# Patient Record
Sex: Female | Born: 1955 | Race: White | Hispanic: No | Smoking: Current every day smoker
Health system: Southern US, Community
[De-identification: ages and names within clinical notes are randomized; demographics above are authoritative.]

## PROBLEM LIST (undated history)

## (undated) DIAGNOSIS — R4182 Altered mental status, unspecified: Secondary | ICD-10-CM

## (undated) DIAGNOSIS — F32A Depression, unspecified: Secondary | ICD-10-CM

## (undated) DIAGNOSIS — E785 Hyperlipidemia, unspecified: Secondary | ICD-10-CM

## (undated) DIAGNOSIS — M1990 Unspecified osteoarthritis, unspecified site: Secondary | ICD-10-CM

## (undated) DIAGNOSIS — K635 Polyp of colon: Secondary | ICD-10-CM

## (undated) DIAGNOSIS — E119 Type 2 diabetes mellitus without complications: Secondary | ICD-10-CM

## (undated) DIAGNOSIS — M539 Dorsopathy, unspecified: Secondary | ICD-10-CM

## (undated) DIAGNOSIS — E7800 Pure hypercholesterolemia, unspecified: Secondary | ICD-10-CM

## (undated) DIAGNOSIS — J069 Acute upper respiratory infection, unspecified: Secondary | ICD-10-CM

## (undated) DIAGNOSIS — B029 Zoster without complications: Secondary | ICD-10-CM

## (undated) DIAGNOSIS — I10 Essential (primary) hypertension: Secondary | ICD-10-CM

## (undated) DIAGNOSIS — F329 Major depressive disorder, single episode, unspecified: Secondary | ICD-10-CM

## (undated) HISTORY — DX: Zoster without complications: B02.9

## (undated) HISTORY — PX: KYPHOPLASTY: SHX5884

## (undated) HISTORY — DX: Pure hypercholesterolemia, unspecified: E78.00

## (undated) HISTORY — DX: Dorsopathy, unspecified: M53.9

## (undated) HISTORY — PX: CHOLECYSTECTOMY: SHX55

## (undated) HISTORY — DX: Unspecified osteoarthritis, unspecified site: M19.90

## (undated) HISTORY — DX: Polyp of colon: K63.5

## (undated) HISTORY — DX: Altered mental status, unspecified: R41.82

## (undated) HISTORY — DX: Depression, unspecified: F32.A

## (undated) HISTORY — DX: Acute upper respiratory infection, unspecified: J06.9

## (undated) HISTORY — DX: Major depressive disorder, single episode, unspecified: F32.9

## (undated) HISTORY — PX: BACK SURGERY: SHX140

## (undated) HISTORY — PX: ABDOMINAL HYSTERECTOMY: SHX81

---

## 2006-06-08 ENCOUNTER — Emergency Department: Payer: Self-pay | Admitting: Emergency Medicine

## 2006-06-08 ENCOUNTER — Other Ambulatory Visit: Payer: Self-pay

## 2006-06-11 ENCOUNTER — Ambulatory Visit: Payer: Self-pay | Admitting: Emergency Medicine

## 2006-06-23 ENCOUNTER — Ambulatory Visit: Payer: Self-pay | Admitting: Gastroenterology

## 2006-07-14 ENCOUNTER — Ambulatory Visit: Payer: Self-pay | Admitting: Gastroenterology

## 2006-12-02 ENCOUNTER — Ambulatory Visit: Payer: Self-pay | Admitting: *Deleted

## 2006-12-08 ENCOUNTER — Ambulatory Visit: Payer: Self-pay | Admitting: *Deleted

## 2007-03-24 ENCOUNTER — Ambulatory Visit: Payer: Self-pay | Admitting: *Deleted

## 2007-05-25 ENCOUNTER — Encounter: Admission: RE | Admit: 2007-05-25 | Discharge: 2007-05-25 | Payer: Self-pay | Admitting: Neurosurgery

## 2007-05-27 ENCOUNTER — Encounter: Admission: RE | Admit: 2007-05-27 | Discharge: 2007-05-27 | Payer: Self-pay | Admitting: Neurosurgery

## 2009-04-30 ENCOUNTER — Encounter: Admission: RE | Admit: 2009-04-30 | Discharge: 2009-04-30 | Payer: Self-pay | Admitting: Orthopaedic Surgery

## 2009-12-10 ENCOUNTER — Encounter: Admission: RE | Admit: 2009-12-10 | Discharge: 2009-12-10 | Payer: Self-pay | Admitting: Orthopaedic Surgery

## 2012-07-06 ENCOUNTER — Ambulatory Visit: Payer: Self-pay

## 2012-07-24 ENCOUNTER — Ambulatory Visit: Payer: Self-pay

## 2012-08-11 ENCOUNTER — Emergency Department (HOSPITAL_COMMUNITY)
Admission: EM | Admit: 2012-08-11 | Discharge: 2012-08-11 | Disposition: A | Discharge status: Home / Self Care | Payer: No Typology Code available for payment source | Attending: Internal Medicine | Admitting: Internal Medicine

## 2012-08-11 ENCOUNTER — Emergency Department (HOSPITAL_COMMUNITY): Payer: No Typology Code available for payment source

## 2012-08-11 ENCOUNTER — Encounter (HOSPITAL_COMMUNITY): Payer: Self-pay | Admitting: *Deleted

## 2012-08-11 DIAGNOSIS — F17200 Nicotine dependence, unspecified, uncomplicated: Secondary | ICD-10-CM | POA: Insufficient documentation

## 2012-08-11 DIAGNOSIS — Y92410 Unspecified street and highway as the place of occurrence of the external cause: Secondary | ICD-10-CM | POA: Insufficient documentation

## 2012-08-11 DIAGNOSIS — S298XXA Other specified injuries of thorax, initial encounter: Secondary | ICD-10-CM | POA: Insufficient documentation

## 2012-08-11 DIAGNOSIS — S161XXA Strain of muscle, fascia and tendon at neck level, initial encounter: Secondary | ICD-10-CM

## 2012-08-11 DIAGNOSIS — Z79899 Other long term (current) drug therapy: Secondary | ICD-10-CM | POA: Insufficient documentation

## 2012-08-11 DIAGNOSIS — E119 Type 2 diabetes mellitus without complications: Secondary | ICD-10-CM | POA: Insufficient documentation

## 2012-08-11 DIAGNOSIS — S139XXA Sprain of joints and ligaments of unspecified parts of neck, initial encounter: Secondary | ICD-10-CM | POA: Insufficient documentation

## 2012-08-11 DIAGNOSIS — I10 Essential (primary) hypertension: Secondary | ICD-10-CM | POA: Insufficient documentation

## 2012-08-11 DIAGNOSIS — E785 Hyperlipidemia, unspecified: Secondary | ICD-10-CM | POA: Insufficient documentation

## 2012-08-11 DIAGNOSIS — Y9389 Activity, other specified: Secondary | ICD-10-CM | POA: Insufficient documentation

## 2012-08-11 HISTORY — DX: Essential (primary) hypertension: I10

## 2012-08-11 HISTORY — DX: Hyperlipidemia, unspecified: E78.5

## 2012-08-11 HISTORY — DX: Type 2 diabetes mellitus without complications: E11.9

## 2012-08-11 MED ORDER — HYDROCODONE-ACETAMINOPHEN 5-325 MG PO TABS: 1.0000 | ORAL_TABLET | Freq: Four times a day (QID) | ORAL | Status: DC | PRN

## 2012-08-11 MED ORDER — HYDROCODONE-ACETAMINOPHEN 5-325 MG PO TABS
1.0000 | ORAL_TABLET | Freq: Once | ORAL | Status: AC
  Administered 2012-08-11: 1 via ORAL
  Filled 2012-08-11: qty 1

## 2012-08-11 MED ORDER — IBUPROFEN 800 MG PO TABS: 800.0000 mg | ORAL_TABLET | Freq: Three times a day (TID) | ORAL | Status: DC | PRN
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
## 2012-08-15 NOTE — ED Provider Notes (Signed)
History/physical exam/procedure(s) were performed by non-physician practitioner and as supervising physician I was immediately available for consultation/collaboration. I have reviewed all notes and am in agreement with care and plan.   Shantasia Hunnell S Teran Daughenbaugh, MD 08/15/12 0714 

## 2012-11-18 ENCOUNTER — Emergency Department: Payer: Self-pay | Admitting: Emergency Medicine

## 2012-11-18 LAB — COMPREHENSIVE METABOLIC PANEL
Anion Gap: 9 (ref 7–16)
Co2: 27 mmol/L (ref 21–32)
Creatinine: 1.27 mg/dL (ref 0.60–1.30)
SGPT (ALT): 20 U/L (ref 12–78)
Total Protein: 8.1 g/dL (ref 6.4–8.2)

## 2012-11-18 LAB — URINALYSIS, COMPLETE
Bilirubin,UR: NEGATIVE
Blood: NEGATIVE
Glucose,UR: NEGATIVE mg/dL (ref 0–75)
Hyaline Cast: 23
Ph: 5 (ref 4.5–8.0)
Protein: 30
Squamous Epithelial: 1

## 2012-11-18 LAB — LIPASE, BLOOD: Lipase: 124 U/L (ref 73–393)

## 2012-11-18 LAB — CBC: MCH: 30.5 pg (ref 26.0–34.0)

## 2012-12-21 ENCOUNTER — Ambulatory Visit: Payer: Self-pay | Admitting: Pain Medicine

## 2013-01-09 ENCOUNTER — Ambulatory Visit: Payer: Self-pay | Admitting: Pain Medicine

## 2013-01-09 LAB — MAGNESIUM: Magnesium: 1.4 mg/dL — ABNORMAL LOW

## 2013-01-09 LAB — BASIC METABOLIC PANEL
Anion Gap: 5 — ABNORMAL LOW (ref 7–16)
BUN: 14 mg/dL (ref 7–18)
Chloride: 103 mmol/L (ref 98–107)
Creatinine: 0.94 mg/dL (ref 0.60–1.30)
EGFR (African American): >60
EGFR (Non-African Amer.): >60
Glucose: 58 mg/dL — ABNORMAL LOW (ref 65–99)
Osmolality: 276 (ref 275–301)
Sodium: 139 mmol/L (ref 136–145)

## 2013-01-09 LAB — SEDIMENTATION RATE: Erythrocyte Sed Rate: 47 mm/hr — ABNORMAL HIGH (ref 0–30)

## 2013-02-08 ENCOUNTER — Ambulatory Visit: Payer: Self-pay | Admitting: Pain Medicine

## 2013-02-14 ENCOUNTER — Ambulatory Visit: Payer: Self-pay | Admitting: Pain Medicine

## 2013-03-03 ENCOUNTER — Ambulatory Visit: Payer: Self-pay | Admitting: Pain Medicine

## 2013-03-07 ENCOUNTER — Ambulatory Visit: Payer: Self-pay | Admitting: Pain Medicine

## 2013-03-24 ENCOUNTER — Ambulatory Visit: Payer: Self-pay | Admitting: Pain Medicine

## 2013-03-31 ENCOUNTER — Emergency Department: Payer: Self-pay | Admitting: Emergency Medicine

## 2013-04-21 ENCOUNTER — Ambulatory Visit: Payer: Self-pay | Admitting: Pain Medicine

## 2013-05-02 ENCOUNTER — Ambulatory Visit: Payer: Self-pay | Admitting: Pain Medicine

## 2013-05-24 ENCOUNTER — Ambulatory Visit: Payer: Self-pay | Admitting: Pain Medicine

## 2013-05-30 ENCOUNTER — Ambulatory Visit: Payer: Self-pay | Admitting: Pain Medicine

## 2013-06-19 ENCOUNTER — Ambulatory Visit: Payer: Self-pay | Admitting: Pain Medicine

## 2013-07-11 ENCOUNTER — Ambulatory Visit: Payer: Self-pay | Admitting: Pain Medicine

## 2013-07-31 ENCOUNTER — Ambulatory Visit: Payer: Self-pay | Admitting: Pain Medicine

## 2013-10-18 ENCOUNTER — Ambulatory Visit: Payer: Self-pay | Admitting: Pain Medicine

## 2013-11-06 ENCOUNTER — Other Ambulatory Visit: Payer: Self-pay | Admitting: Pain Medicine

## 2013-11-06 LAB — MAGNESIUM: MAGNESIUM: 1.6 mg/dL — AB

## 2013-11-13 ENCOUNTER — Ambulatory Visit: Payer: Self-pay | Admitting: Pain Medicine

## 2013-12-12 ENCOUNTER — Ambulatory Visit: Payer: Self-pay | Admitting: Pain Medicine

## 2014-01-01 ENCOUNTER — Ambulatory Visit: Payer: Self-pay | Admitting: Pain Medicine

## 2014-01-10 ENCOUNTER — Ambulatory Visit: Payer: Self-pay | Admitting: Pain Medicine

## 2014-02-04 DIAGNOSIS — F419 Anxiety disorder, unspecified: Secondary | ICD-10-CM | POA: Insufficient documentation

## 2014-02-04 DIAGNOSIS — F329 Major depressive disorder, single episode, unspecified: Secondary | ICD-10-CM | POA: Insufficient documentation

## 2014-02-04 DIAGNOSIS — E119 Type 2 diabetes mellitus without complications: Secondary | ICD-10-CM | POA: Insufficient documentation

## 2014-02-04 DIAGNOSIS — R4182 Altered mental status, unspecified: Secondary | ICD-10-CM | POA: Insufficient documentation

## 2014-02-04 DIAGNOSIS — F32A Depression, unspecified: Secondary | ICD-10-CM | POA: Insufficient documentation

## 2014-02-04 HISTORY — DX: Altered mental status, unspecified: R41.82

## 2014-02-13 DIAGNOSIS — E785 Hyperlipidemia, unspecified: Secondary | ICD-10-CM | POA: Insufficient documentation

## 2014-02-14 ENCOUNTER — Ambulatory Visit: Payer: Self-pay | Admitting: Pain Medicine

## 2014-04-09 ENCOUNTER — Ambulatory Visit: Payer: Self-pay | Admitting: Pain Medicine

## 2014-04-26 ENCOUNTER — Ambulatory Visit: Payer: Self-pay | Admitting: Pain Medicine

## 2014-06-04 ENCOUNTER — Ambulatory Visit: Payer: Self-pay | Admitting: Pain Medicine

## 2014-07-10 ENCOUNTER — Ambulatory Visit: Payer: Self-pay | Admitting: Pain Medicine

## 2015-05-12 ENCOUNTER — Emergency Department
Admission: EM | Admit: 2015-05-12 | Discharge: 2015-05-12 | Disposition: A | Discharge status: Home / Self Care | Payer: Medicare Other | Attending: Emergency Medicine | Admitting: Emergency Medicine

## 2015-05-12 DIAGNOSIS — Z880 Allergy status to penicillin: Secondary | ICD-10-CM | POA: Diagnosis not present

## 2015-05-12 DIAGNOSIS — F1193 Opioid use, unspecified with withdrawal: Secondary | ICD-10-CM | POA: Insufficient documentation

## 2015-05-12 DIAGNOSIS — R51 Headache: Secondary | ICD-10-CM | POA: Insufficient documentation

## 2015-05-12 DIAGNOSIS — R112 Nausea with vomiting, unspecified: Secondary | ICD-10-CM | POA: Diagnosis present

## 2015-05-12 DIAGNOSIS — F1123 Opioid dependence with withdrawal: Secondary | ICD-10-CM

## 2015-05-12 DIAGNOSIS — E119 Type 2 diabetes mellitus without complications: Secondary | ICD-10-CM | POA: Diagnosis not present

## 2015-05-12 DIAGNOSIS — Z79899 Other long term (current) drug therapy: Secondary | ICD-10-CM | POA: Diagnosis not present

## 2015-05-12 DIAGNOSIS — Z720 Tobacco use: Secondary | ICD-10-CM | POA: Insufficient documentation

## 2015-05-12 DIAGNOSIS — R197 Diarrhea, unspecified: Secondary | ICD-10-CM | POA: Insufficient documentation

## 2015-05-12 DIAGNOSIS — I10 Essential (primary) hypertension: Secondary | ICD-10-CM | POA: Diagnosis not present

## 2015-05-12 LAB — COMPREHENSIVE METABOLIC PANEL
ALK PHOS: 71 U/L (ref 38–126)
ALT: 15 U/L (ref 14–54)
ANION GAP: 8 (ref 5–15)
AST: 26 U/L (ref 15–41)
Albumin: 4 g/dL (ref 3.5–5.0)
BILIRUBIN TOTAL: 0.6 mg/dL (ref 0.3–1.2)
BUN: 12 mg/dL (ref 6–20)
CALCIUM: 10.1 mg/dL (ref 8.9–10.3)
CO2: 31 mmol/L (ref 22–32)
CREATININE: 0.98 mg/dL (ref 0.44–1.00)
Chloride: 104 mmol/L (ref 101–111)
Glucose, Bld: 119 mg/dL — ABNORMAL HIGH (ref 65–99)
Potassium: 4 mmol/L (ref 3.5–5.1)
SODIUM: 143 mmol/L (ref 135–145)
TOTAL PROTEIN: 7.3 g/dL (ref 6.5–8.1)

## 2015-05-12 LAB — CBC WITH DIFFERENTIAL/PLATELET
BASOS ABS: 0.1 10*3/uL (ref 0–0.1)
BASOS PCT: 1 %
EOS ABS: 0.2 10*3/uL (ref 0–0.7)
Eosinophils Relative: 2 %
HEMATOCRIT: 40.5 % (ref 35.0–47.0)
HEMOGLOBIN: 13.9 g/dL (ref 12.0–16.0)
Lymphocytes Relative: 36 %
Lymphs Abs: 3.2 10*3/uL (ref 1.0–3.6)
MCH: 30.5 pg (ref 26.0–34.0)
MCHC: 34.2 g/dL (ref 32.0–36.0)
MCV: 89 fL (ref 80.0–100.0)
MONOS PCT: 6 %
Monocytes Absolute: 0.5 10*3/uL (ref 0.2–0.9)
NEUTROS ABS: 5 10*3/uL (ref 1.4–6.5)
NEUTROS PCT: 55 %
Platelets: 259 10*3/uL (ref 150–440)
RBC: 4.55 MIL/uL (ref 3.80–5.20)
RDW: 14.2 % (ref 11.5–14.5)
WBC: 9 10*3/uL (ref 3.6–11.0)

## 2015-05-12 MED ORDER — SODIUM CHLORIDE 0.9 % IV BOLUS (SEPSIS)
500.0000 mL | Freq: Once | INTRAVENOUS | Status: AC
  Administered 2015-05-12: 500 mL via INTRAVENOUS

## 2015-05-12 MED ORDER — TRAMADOL HCL 50 MG PO TABS
50.0000 mg | ORAL_TABLET | Freq: Four times a day (QID) | ORAL | Status: DC | PRN
Start: 2015-05-12 — End: 2015-06-18

## 2015-05-12 MED ORDER — CLONIDINE HCL 0.1 MG PO TABS
0.2000 mg | ORAL_TABLET | Freq: Once | ORAL | Status: AC
  Administered 2015-05-12: 0.2 mg via ORAL
  Filled 2015-05-12: qty 2

## 2015-05-12 MED ORDER — HYDROMORPHONE HCL 2 MG PO TABS
2.0000 mg | ORAL_TABLET | Freq: Once | ORAL | Status: AC
Start: 2015-05-12 — End: 2015-05-12
  Administered 2015-05-12: 2 mg via ORAL
  Filled 2015-05-12: qty 1

## 2015-05-12 MED ORDER — HYDROMORPHONE HCL 2 MG PO TABS: 2.0000 mg | ORAL_TABLET | Freq: Two times a day (BID) | ORAL | Status: AC | PRN

## 2015-05-12 MED ORDER — TRAMADOL HCL 50 MG PO TABS
50.0000 mg | ORAL_TABLET | Freq: Once | ORAL | Status: AC
  Administered 2015-05-12: 50 mg via ORAL
  Filled 2015-05-12: qty 1

## 2015-05-12 MED ORDER — LORAZEPAM 2 MG/ML IJ SOLN
1.0000 mg | Freq: Once | INTRAMUSCULAR | Status: AC
  Administered 2015-05-12: 1 mg via INTRAVENOUS
  Filled 2015-05-12: qty 1

## 2015-05-12 MED ORDER — CLONIDINE HCL 0.1 MG PO TABS: 0.1000 mg | ORAL_TABLET | Freq: Two times a day (BID) | ORAL | Status: DC | PRN

## 2015-05-12 NOTE — ED Notes (Signed)
Pt with hx of chronic back pain, states that she out of pain medication (fentanyl patch)   She is a pt of DR Laban Emperor with [facility]- because of a transition with his practice he gave her 3 prescriptions to get her through the 3 months.    She ran out of fentanyl 5 days ago but she is unable to find the other prescriptions.    Pt is c/o back pain, nausea, vomiting, diarrhea, headache,  Sweats and a fine tremor in her hands with fine motor skills.

## 2015-05-12 NOTE — ED Provider Notes (Signed)
Time Seen: Approximately ----------------------------------------- 6:59 PM on 05/12/2015 -----------------------------------------   I have reviewed the triage notes  Chief Complaint: Back Pain   History of Present Illness: Tammy Acosta is a 59 y.o. female who presents to the emergency department with symptoms of nausea, vomiting, diarrhea, headaches. Patient's been on long-term fentanyl patch treatment for previous lumbar fracture with previous back surgery. She is normally on Duragesic 75 g patches. She states her last patch she had was 5 days ago and she went to get her normally 3 prescribed pain meds and found the prescriptions missing. She states her father has dementia and will occasionally go. [employer]. She's tried to find the prescriptions in the household. Patient is followed locally by Dr. Laban Emperor [facility]. Patient states she does not want to be on the front no patch any longer and feels she may be at a point where she can transition to less potent medication. Past Medical History  Diagnosis Date  . Diabetes mellitus without complication   . Hypertension   . Hyperlipidemia     There are no active problems to display for this patient.   Past Surgical History  Procedure Laterality Date  . Back surgery    . Abdominal hysterectomy    . Cholecystectomy      Past Surgical History  Procedure Laterality Date  . Back surgery    . Abdominal hysterectomy    . Cholecystectomy      Current Outpatient Rx  Name  Route  Sig  Dispense  Refill  . ALPRAZolam (XANAX) 1 MG tablet   Oral   Take 1 mg by mouth 4 (four) times daily.         . fentaNYL (DURAGESIC - DOSED MCG/HR) 75 MCG/HR   Transdermal   Place 1 patch onto the skin every 3 (three) days.         Marland Kitchen gabapentin (NEURONTIN) 300 MG capsule   Oral   Take 600 mg by mouth 3 (three) times daily.         Marland Kitchen glimepiride (AMARYL) 2 MG tablet   Oral   Take 2 mg by mouth daily before  breakfast.         . hydrochlorothiazide (HYDRODIURIL) 12.5 MG tablet   Oral   Take 12.5 mg by mouth daily.         Marland Kitchen HYDROcodone-acetaminophen (NORCO/VICODIN) 5-325 MG per tablet   Oral   Take 1 tablet by mouth every 6 (six) hours as needed for pain.   15 tablet   0   . ibuprofen (ADVIL,MOTRIN) 800 MG tablet   Oral   Take 1 tablet (800 mg total) by mouth every 8 (eight) hours as needed for pain.   21 tablet   0   . lisinopril (PRINIVIL,ZESTRIL) 10 MG tablet   Oral   Take 5 mg by mouth daily.         . metFORMIN (GLUCOPHAGE) 1000 MG tablet   Oral   Take 1,000 mg by mouth 2 (two) times daily with a meal.         . simvastatin (ZOCOR) 40 MG tablet   Oral   Take 40 mg by mouth every evening.         Marland Kitchen tiZANidine (ZANAFLEX) 4 MG tablet   Oral   Take 4 mg by mouth 3 (three) times daily.         Marland Kitchen venlafaxine (EFFEXOR) 75 MG tablet   Oral   Take 75  mg by mouth 3 (three) times daily.           Allergies:  Penicillins and Tetracyclines & related  Family History: No family history on file.  Social History: Social History  Substance Use Topics  . Smoking status: Current Every Day Smoker  . Smokeless tobacco: Not on file  . Alcohol Use: No     Review of Systems:   10 point review of systems was performed and was otherwise negative:  Constitutional: No fever Eyes: No visual disturbances ENT: No sore throat, ear pain Cardiac: No chest pain Respiratory: No shortness of breath, wheezing, or stridor Abdomen: No abdominal pain, no vomiting, No diarrhea Endocrine: No weight loss, No night sweats Extremities: No peripheral edema, cyanosis Skin: No rashes, easy bruising Neurologic: No focal weakness, trouble with speech or swollowing Urologic: No dysuria, Hematuria, or urinary frequency   Physical Exam:  ED Triage Vitals  Enc Vitals Group     BP 05/12/15 1823 230/124 mmHg     Pulse Rate 05/12/15 1823 92     Resp 05/12/15 1841 11     Temp  05/12/15 1823 98.8 F (37.1 C)     Temp Source 05/12/15 1823 Oral     SpO2 05/12/15 1823 99 %     Weight 05/12/15 1823 150 lb (68.04 kg)     Height 05/12/15 1823  (1.626 m)     Head Cir --      Peak Flow --      Pain Score 05/12/15 1825 10     Pain Loc --      Pain Edu? --      Excl. in GC? --     General: Awake , Alert , and Oriented times 3; GCS 15 Head: Normal cephalic , atraumatic Eyes: Pupils equal , round, reactive to light Nose/Throat: No nasal drainage, patent upper airway without erythema or exudate.  Neck: Supple, Full range of motion, No anterior adenopathy or palpable thyroid masses Lungs: Clear to ascultation without wheezes , rhonchi, or rales Heart: Regular rate, regular rhythm without murmurs , gallops , or rubs Abdomen: Soft, non tender without rebound, guarding , or rigidity; bowel sounds positive and symmetric in all 4 quadrants. No organomegaly .        Extremities: 2 plus symmetric pulses. No edema, clubbing or cyanosis Neurologic: normal ambulation, Motor symmetric without deficits, sensory intact Skin: warm, dry, no rashes   Labs:   All laboratory work was reviewed including any pertinent negatives or positives listed below:  Labs Reviewed  COMPREHENSIVE METABOLIC PANEL  CBC WITH DIFFERENTIAL/PLATELET   laboratory work was reviewed with no significant findings   ED Course:  The patient remained hypertensive and was given some clonidine prior to discharge and she's been advised continue with her clonidine at home as needed and continue with her normal blood pressure medications. She's been advised to drink plenty of fluids and return here if her condition worsens. I prescribed her Ultram and a course of Dilaudid to try to wean her off of her fentanyl patches. He appears of awareness and is been advised to contact her primary physician on an outpatient basis to assist her with coming down on the narcotics.    Assessment: Prescription narcotic  withdrawal  Final Clinical Impression:  Prescription narcotic withdrawal Final diagnoses:  None     Plan:  Outpatient management           Jennye Moccasin, MD 05/12/15 2212

## 2015-05-12 NOTE — Discharge Instructions (Signed)
Opioid Withdrawal Opioids are a group of narcotic drugs. They include the street drug heroin. They also include pain medicines, such as morphine, hydrocodone, oxycodone, and fentanyl. Opioid withdrawal is a group of characteristic physical and mental signs and symptoms. It typically occurs if you have been using opioids daily for several weeks or longer and stop using or rapidly decrease use. Opioid withdrawal can also occur if you have used opioids daily for a long time and are given a medicine to block the effect.  SIGNS AND SYMPTOMS Opioid withdrawal includes three or more of the following symptoms:   Depressed, anxious, or irritable mood.  Nausea or vomiting.  Muscle aches or spasms.   Watery eyes.   Runny nose.  Dilated pupils, sweating, or hairs standing on end.  Diarrhea or intestinal cramping.  Yawning.   Fever.  Increased blood pressure.  Fast pulse.  Restlessness or trouble sleeping. These signs and symptoms occur within several hours of stopping or reducing short-acting opioids, such as heroin. They can occur within 3 days of stopping or reducing long-acting opioids, such as methadone. Withdrawal begins within minutes of receiving a drug that blocks the effects of opioids, such as naltrexone or naloxone. DIAGNOSIS  Opioid use disorder is diagnosed by your health care provider. You will be asked about your symptoms, drug and alcohol use, medical history, and use of medicines. A physical exam may be done. Lab tests may be ordered. Your health care provider may have you see a mental health professional.  TREATMENT  The treatment for opioid withdrawal is usually provided by medical doctors with special training in substance use disorders (addiction specialists). The following medicines may be included in treatment:  Opioids given in place of the abused opioid. They turn on opioid receptors in the brain and lessen or prevent withdrawal symptoms. They are gradually  decreased (opioid substitution and taper).  Non-opioids that can lessen certain opioid withdrawal symptoms. They may be used alone or with opioid substitution and taper. Successful long-term recovery usually requires medicine, counseling, and group support. HOME CARE INSTRUCTIONS   Take medicines only as directed by your health care provider.  Check with your health care provider before starting new medicines.  Keep all follow-up visits as directed by your health care provider. SEEK MEDICAL CARE IF:  You are not able to take your medicines as directed.  Your symptoms get worse.  You relapse. SEEK IMMEDIATE MEDICAL CARE IF:  You have serious thoughts about hurting yourself or others.  You have a seizure.  You lose consciousness. Document Released: 08/13/2003 Document Revised: 12/25/2013 Document Reviewed: 08/23/2013 [facility] Patient Information 2015 [place], [place]. This information is not intended to replace advice given to you by your health care provider. Make sure you discuss any questions you have with your health care provider.  Please return immediately if condition worsens. Please contact her primary physician or the physician you were given for referral. If you have any specialist physicians involved in her treatment and plan please also contact them. Thank you for using [facility].

## 2015-05-12 NOTE — ED Notes (Signed)
Pt in room 13,  Primary RN Helmut Muster and Dr Huel Cote aware of patient/abnormal vitals.

## 2015-05-21 DIAGNOSIS — I10 Essential (primary) hypertension: Secondary | ICD-10-CM | POA: Insufficient documentation

## 2015-06-17 ENCOUNTER — Encounter: Payer: Medicare Other | Admitting: Pain Medicine

## 2015-06-18 ENCOUNTER — Encounter: Payer: Self-pay | Admitting: Pain Medicine

## 2015-06-18 ENCOUNTER — Ambulatory Visit: Payer: Medicare Other | Attending: Pain Medicine | Admitting: Pain Medicine

## 2015-06-18 VITALS — BP 195/105 | HR 87 | Temp 98.3°F | Resp 18 | Ht 63.0 in | Wt 152.0 lb

## 2015-06-18 DIAGNOSIS — M79605 Pain in left leg: Secondary | ICD-10-CM | POA: Diagnosis not present

## 2015-06-18 DIAGNOSIS — M79604 Pain in right leg: Secondary | ICD-10-CM | POA: Insufficient documentation

## 2015-06-18 DIAGNOSIS — M5416 Radiculopathy, lumbar region: Secondary | ICD-10-CM | POA: Diagnosis not present

## 2015-06-18 DIAGNOSIS — M961 Postlaminectomy syndrome, not elsewhere classified: Secondary | ICD-10-CM | POA: Insufficient documentation

## 2015-06-18 DIAGNOSIS — Z79891 Long term (current) use of opiate analgesic: Secondary | ICD-10-CM | POA: Insufficient documentation

## 2015-06-18 DIAGNOSIS — I10 Essential (primary) hypertension: Secondary | ICD-10-CM | POA: Diagnosis not present

## 2015-06-18 DIAGNOSIS — E119 Type 2 diabetes mellitus without complications: Secondary | ICD-10-CM | POA: Diagnosis not present

## 2015-06-18 DIAGNOSIS — M545 Low back pain, unspecified: Secondary | ICD-10-CM

## 2015-06-18 DIAGNOSIS — G894 Chronic pain syndrome: Secondary | ICD-10-CM | POA: Insufficient documentation

## 2015-06-18 DIAGNOSIS — E785 Hyperlipidemia, unspecified: Secondary | ICD-10-CM | POA: Insufficient documentation

## 2015-06-18 DIAGNOSIS — Z7984 Long term (current) use of oral hypoglycemic drugs: Secondary | ICD-10-CM | POA: Diagnosis not present

## 2015-06-18 DIAGNOSIS — G8929 Other chronic pain: Secondary | ICD-10-CM | POA: Diagnosis not present

## 2015-06-18 DIAGNOSIS — F17200 Nicotine dependence, unspecified, uncomplicated: Secondary | ICD-10-CM | POA: Insufficient documentation

## 2015-06-18 DIAGNOSIS — F1190 Opioid use, unspecified, uncomplicated: Secondary | ICD-10-CM | POA: Diagnosis not present

## 2015-06-18 DIAGNOSIS — M539 Dorsopathy, unspecified: Secondary | ICD-10-CM | POA: Diagnosis not present

## 2015-06-18 DIAGNOSIS — Z5181 Encounter for therapeutic drug level monitoring: Secondary | ICD-10-CM | POA: Insufficient documentation

## 2015-06-18 DIAGNOSIS — Z79899 Other long term (current) drug therapy: Secondary | ICD-10-CM

## 2015-06-18 DIAGNOSIS — M549 Dorsalgia, unspecified: Secondary | ICD-10-CM | POA: Insufficient documentation

## 2015-06-18 DIAGNOSIS — F1120 Opioid dependence, uncomplicated: Secondary | ICD-10-CM

## 2015-06-18 MED ORDER — GABAPENTIN 600 MG PO TABS: 600.0000 mg | ORAL_TABLET | Freq: Three times a day (TID) | ORAL | Status: DC

## 2015-06-18 MED ORDER — MORPHINE SULFATE ER 15 MG PO TBCR: 15.0000 mg | EXTENDED_RELEASE_TABLET | Freq: Two times a day (BID) | ORAL | Status: DC
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
## 2015-06-18 NOTE — Progress Notes (Signed)
Safety precautions to be maintained throughout the outpatient stay will include: orient to surroundings, keep bed in low position, maintain call bell within reach at all times, provide assistance with transfer out of bed and ambulation. Did not bring pills Pamphlet given on How to protect your medicines at home

## 2015-06-18 NOTE — Patient Instructions (Addendum)
Smoking Cessation, Tips for Success If you are ready to quit smoking, congratulations! You have chosen to help yourself be healthier. Cigarettes bring nicotine, tar, carbon monoxide, and other irritants into your body. Your lungs, heart, and blood vessels will be able to work better without these poisons. There are many different ways to quit smoking. Nicotine gum, nicotine patches, a nicotine inhaler, or nicotine nasal spray can help with physical craving. Hypnosis, support groups, and medicines help break the habit of smoking. WHAT THINGS CAN I DO TO MAKE QUITTING EASIER?  Here are some tips to help you quit for good:  Pick a date when you will quit smoking completely. Tell all of your friends and family about your plan to quit on that date.  Do not try to slowly cut down on the number of cigarettes you are smoking. Pick a quit date and quit smoking completely starting on that day.  Throw away all cigarettes.   Clean and remove all ashtrays from your home, work, and car.  On a card, write down your reasons for quitting. Carry the card with you and read it when you get the urge to smoke.  Cleanse your body of nicotine. Drink enough water and fluids to keep your urine clear or pale yellow. Do this after quitting to flush the nicotine from your body.  Learn to predict your moods. Do not let a bad situation be your excuse to have a cigarette. Some situations in your life might tempt you into wanting a cigarette.  Never have "just one" cigarette. It leads to wanting another and another. Remind yourself of your decision to quit.  Change habits associated with smoking. If you smoked while driving or when feeling stressed, try other activities to replace smoking. Stand up when drinking your coffee. Brush your teeth after eating. Sit in a different chair when you read the paper. Avoid alcohol while trying to quit, and try to drink fewer caffeinated beverages. Alcohol and caffeine may urge you to  smoke.  Avoid foods and drinks that can trigger a desire to smoke, such as sugary or spicy foods and alcohol.  Ask people who smoke not to smoke around you.  Have something planned to do right after eating or having a cup of coffee. For example, plan to take a walk or exercise.  Try a relaxation exercise to calm you down and decrease your stress. Remember, you may be tense and nervous for the first 2 weeks after you quit, but this will pass.  Find new activities to keep your hands busy. Play with a pen, coin, or rubber band. Doodle or draw things on paper.  Brush your teeth right after eating. This will help cut down on the craving for the taste of tobacco after meals. You can also try mouthwash.   Use oral substitutes in place of cigarettes. Try using lemon drops, carrots, cinnamon sticks, or chewing gum. Keep them handy so they are available when you have the urge to smoke.  When you have the urge to smoke, try deep breathing.  Designate your home as a nonsmoking area.  If you are a heavy smoker, ask your health care provider about a prescription for nicotine chewing gum. It can ease your withdrawal from nicotine.  Reward yourself. Set aside the cigarette money you save and buy yourself something nice.  Look for support from others. Join a support group or smoking cessation program. Ask someone at home or at work to help you with your plan   to quit smoking.  Always ask yourself, "Do I need this cigarette or is this just a reflex?" Tell yourself, "Today, I choose not to smoke," or "I do not want to smoke." You are reminding yourself of your decision to quit.  Do not replace cigarette smoking with electronic cigarettes (commonly called e-cigarettes). The safety of e-cigarettes is unknown, and some may contain harmful chemicals.  If you relapse, do not give up! Plan ahead and think about what you will do the next time you get the urge to smoke. HOW WILL I FEEL WHEN I QUIT SMOKING? You  may have symptoms of withdrawal because your body is used to nicotine (the addictive substance in cigarettes). You may crave cigarettes, be irritable, feel very hungry, cough often, get headaches, or have difficulty concentrating. The withdrawal symptoms are only temporary. They are strongest when you first quit but will go away within 10-14 days. When withdrawal symptoms occur, stay in control. Think about your reasons for quitting. Remind yourself that these are signs that your body is healing and getting used to being without cigarettes. Remember that withdrawal symptoms are easier to treat than the major diseases that smoking can cause.  Even after the withdrawal is over, expect periodic urges to smoke. However, these cravings are generally short lived and will go away whether you smoke or not. Do not smoke! WHAT RESOURCES ARE AVAILABLE TO HELP ME QUIT SMOKING? Your health care provider can direct you to community resources or hospitals for support, which may include:  Group support.  Education.  Hypnosis.  Therapy.   This information is not intended to replace advice given to you by your health care provider. Make sure you discuss any questions you have with your health care provider.   Document Released: 05/08/2004 Document Revised: 08/31/2014 Document Reviewed: 01/26/2013 Elsevier Interactive Patient Education 2016 [employer] Morphine sustained-release tablets What is this medicine? MORPHINE (MOR feen) is a pain reliever. It is used to treat moderate to severe pain that lasts for more than a few days. This medicine may be used for other purposes; ask your health care provider or pharmacist if you have questions. What should I tell my health care provider before I take this medicine? They need to know if you have any of these conditions: -brain tumor -drug abuse or addiction -gallbladder disease -head injury -heart disease -if you frequently drink alcohol-containing  drinks -intestinal disease -kidney disease or problems urinating -kyphoscoliosis -liver disease -lung disease, asthma, or breathing problems -pancreatic disease -seizures -stomach or intestine problems -taken isocarboxazid, phenelzine, tranylcypromine, or selegiline in the past 2 weeks -thyroid disease -an unusual or allergic reaction to lactose, morphine, other medicines, foods, dyes, or preservatives -pregnant or trying to get pregnant -breast-feeding How should I use this medicine? Take this medicine by mouth with a glass of water. Do not break, crush, or chew the medicine. Do not take a tablet that is not whole. A broken or crushed tablet can be very dangerous. You may get too much medicine. If the medicine upsets your stomach, take it with food or milk. Follow the directions on the prescription label. Take the medicine at the same time each day. Do not take more medicine than you are told to take. A special MedGuide will be given to you by the pharmacist with each prescription and refill. Be sure to read this information carefully each time. Talk to your pediatrician regarding the use of this medicine in children. Special care may be needed. Overdosage: If you  think you have taken too much of this medicine contact a poison control center or emergency room at once. NOTE: This medicine is only for you. Do not share this medicine with others. What if I miss a dose? If you miss a dose, take it as soon as you can. If it is almost time for your next dose, take only that dose. Do not take double or extra doses. What may interact with this medicine? Do not take this medicine with any of the following medications: -MAOIs like Carbex, Eldepryl, Marplan, Nardil, and Parnate This medicine may also interact with the following medications: -alcohol -antihistamines -barbiturates, like phenobarbital -medicines for depression, anxiety, or psychotic disturbances -medicines for sleep -muscle  relaxants -naltrexone, naloxone -narcotic medicines (opiates) for pain -rifampin -tramadol This list may not describe all possible interactions. Give your health care provider a list of all the medicines, herbs, non-prescription drugs, or dietary supplements you use. Also tell them if you smoke, drink alcohol, or use illegal drugs. Some items may interact with your medicine. What should I watch for while using this medicine? Tell your doctor or health care professional if your pain does not go away, if it gets worse, or if you have new or a different type of pain. You may develop tolerance to the medicine. Tolerance means that you will need a higher dose of the medicine for pain relief. Tolerance is normal and is expected if you take this medicine for a long time. Do not suddenly stop taking your medicine because you may develop a severe reaction. Your body becomes used to the medicine. This does NOT mean you are addicted. Addiction is a behavior related to getting and using a drug for a non-medical reason. If you have pain, you have a medical reason to take pain medicine. Your doctor will tell you how much medicine to take. If your doctor wants you to stop the medicine, the dose will be slowly lowered over time to avoid any side effects. You may get drowsy or dizzy when you first start taking the medicine or change doses. Do not drive, use machinery, or do anything that may be dangerous until you know how the medicine affects you. Stand or sit up slowly. There are different types of narcotic medicines (opiates) for pain. If you take more than one type at the same time, you may have more side effects. Give your health care provider a list of all medicines you use. Your doctor will tell you how much medicine to take. Do not take more medicine than directed. Call emergency for help if you have problems breathing. This medicine will cause constipation. Try to have a bowel movement at least every 2 to 3 days.  If you do not have a bowel movement for 3 days, call your doctor or health care professional. Your mouth may get dry. Drinking water, chewing sugarless gum, or sucking on hard candy may help. See your dentist every 6 months. What side effects may I notice from receiving this medicine? Side effects that you should report to your doctor or health care professional as soon as possible: -allergic reactions like skin rash, itching or hives, swelling of the face, lips, or tongue -breathing problems -change in the amount of urine -confusion -fast, irregular heartbeat -feeling faint or lightheaded -fever, chills -hallucinations -seizures -slow or fast heartbeat -unusually weak or tired Side effects that usually do not require medical attention (report to your doctor or health care professional if they continue or are bothersome): -  constipation -dizziness -headache -nausea, vomiting -pinpoint pupils -sweating This list may not describe all possible side effects. Call your doctor for medical advice about side effects. You may report side effects to FDA at 1-800-FDA-1088. Where should I keep my medicine? Keep out of the reach of children. This medicine can be abused. Keep your medicine in a safe place to protect it from theft. Do not share this medicine with anyone. Selling or giving away this medicine is dangerous and is against the law. Store at room temperature between 15 and 30 degrees C (59 and 86 degrees F). Protect from light. This medicine may cause accidental overdose and death if it is taken by other adults, children, or pets. Flush any unused medicine down the toilet to reduce the chance of harm. Do not use the medicine after the expiration date. NOTE: This sheet is a summary. It may not cover all possible information. If you have questions about this medicine, talk to your doctor, pharmacist, or health care provider.    2016, Elsevier/[employer] Standard. (2013-03-14 12:15:31) Epidural  Steroid Injection An epidural steroid injection is given to relieve pain in your neck, back, or legs that is caused by the irritation or swelling of a nerve root. This procedure involves injecting a steroid and numbing medicine (anesthetic) into the epidural space. The epidural space is the space between the outer covering of your spinal cord and the bones that form your backbone (vertebra).  LET [facility] CARE PROVIDER KNOW ABOUT:   Any allergies you have.  All medicines you are taking, including vitamins, herbs, eye drops, creams, and over-the-counter medicines such as aspirin.  Previous problems you or members of your family have had with the use of anesthetics.  Any blood disorders or blood clotting disorders you have.  Previous surgeries you have had.  Medical conditions you have. RISKS AND COMPLICATIONS Generally, this is a safe procedure. However, as with any procedure, complications can occur. Possible complications of epidural steroid injection include:  Headache.  Bleeding.  Infection.  Allergic reaction to the medicines.  Damage to your nerves. The response to this procedure depends on the underlying cause of the pain and its duration. People who have long-term (chronic) pain are less likely to benefit from epidural steroids than are those people whose pain comes on strong and suddenly. BEFORE THE PROCEDURE   Ask your health care provider about changing or stopping your regular medicines. You may be advised to stop taking blood-thinning medicines a few days before the procedure.  You may be given medicines to reduce anxiety.  Arrange for someone to take you home after the procedure. PROCEDURE   You will remain awake during the procedure. You may receive medicine to make you relaxed.  You will be asked to lie on your stomach.  The injection site will be cleaned.  The injection site will be numbed with a medicine (local anesthetic).  A needle will be injected  through your skin into the epidural space.  Your health care provider will use an X-ray machine to ensure that the steroid is delivered closest to the affected nerve. You may have minimal discomfort at this time.  Once the needle is in the right position, the local anesthetic and the steroid will be injected into the epidural space.  The needle will then be removed and a bandage will be applied to the injection site. AFTER THE PROCEDURE   You may be monitored for a short time before you go home.  You may  feel weakness or numbness in your arm or leg, which disappears within hours.  You may be allowed to eat, drink, and take your regular medicine.  You may have soreness at the site of the injection.   This information is not intended to replace advice given to you by your health care provider. Make sure you discuss any questions you have with your health care provider.   Document Released: 11/17/2007 Document Revised: 04/12/2013 Document Reviewed: 01/27/2013 Elsevier Interactive Patient Education [employer].

## 2015-06-25 ENCOUNTER — Ambulatory Visit: Payer: Medicare Other | Admitting: Pain Medicine

## 2015-07-11 ENCOUNTER — Other Ambulatory Visit: Payer: Self-pay | Admitting: Pain Medicine

## 2015-07-22 ENCOUNTER — Ambulatory Visit: Payer: Medicare Other | Attending: Pain Medicine | Admitting: Pain Medicine

## 2015-07-22 ENCOUNTER — Other Ambulatory Visit: Payer: Self-pay | Admitting: Pain Medicine

## 2015-07-22 VITALS — BP 184/94 | HR 90 | Temp 98.6°F | Resp 18 | Ht 63.0 in | Wt 152.0 lb

## 2015-07-22 DIAGNOSIS — M79605 Pain in left leg: Secondary | ICD-10-CM | POA: Insufficient documentation

## 2015-07-22 DIAGNOSIS — E785 Hyperlipidemia, unspecified: Secondary | ICD-10-CM | POA: Insufficient documentation

## 2015-07-22 DIAGNOSIS — M79604 Pain in right leg: Secondary | ICD-10-CM | POA: Insufficient documentation

## 2015-07-22 DIAGNOSIS — M7918 Myalgia, other site: Secondary | ICD-10-CM | POA: Insufficient documentation

## 2015-07-22 DIAGNOSIS — Z5181 Encounter for therapeutic drug level monitoring: Secondary | ICD-10-CM | POA: Diagnosis not present

## 2015-07-22 DIAGNOSIS — M791 Myalgia: Secondary | ICD-10-CM

## 2015-07-22 DIAGNOSIS — G8929 Other chronic pain: Secondary | ICD-10-CM | POA: Diagnosis not present

## 2015-07-22 DIAGNOSIS — F1190 Opioid use, unspecified, uncomplicated: Secondary | ICD-10-CM | POA: Diagnosis not present

## 2015-07-22 DIAGNOSIS — Z7189 Other specified counseling: Secondary | ICD-10-CM

## 2015-07-22 DIAGNOSIS — M545 Low back pain, unspecified: Secondary | ICD-10-CM

## 2015-07-22 DIAGNOSIS — Z79899 Other long term (current) drug therapy: Secondary | ICD-10-CM

## 2015-07-22 DIAGNOSIS — M792 Neuralgia and neuritis, unspecified: Secondary | ICD-10-CM | POA: Insufficient documentation

## 2015-07-22 DIAGNOSIS — E7800 Pure hypercholesterolemia, unspecified: Secondary | ICD-10-CM | POA: Insufficient documentation

## 2015-07-22 DIAGNOSIS — E119 Type 2 diabetes mellitus without complications: Secondary | ICD-10-CM | POA: Insufficient documentation

## 2015-07-22 DIAGNOSIS — I10 Essential (primary) hypertension: Secondary | ICD-10-CM | POA: Diagnosis not present

## 2015-07-22 DIAGNOSIS — Z79891 Long term (current) use of opiate analgesic: Secondary | ICD-10-CM

## 2015-07-22 DIAGNOSIS — M549 Dorsalgia, unspecified: Secondary | ICD-10-CM | POA: Diagnosis present

## 2015-07-22 MED ORDER — MORPHINE SULFATE ER 15 MG PO TBCR: 15.0000 mg | EXTENDED_RELEASE_TABLET | Freq: Two times a day (BID) | ORAL | Status: DC

## 2015-07-22 MED ORDER — GABAPENTIN 600 MG PO TABS: 600.0000 mg | ORAL_TABLET | Freq: Three times a day (TID) | ORAL | Status: DC

## 2015-07-22 NOTE — Progress Notes (Signed)
Patient's Name: Tammy Acosta MRN: [id number] DOB: 09-03-1955 DOS: 07/22/2015  Primary Reason(s) for Visit: Encounter for Medication Management CC: Back Pain   HPI:   Tammy Acosta is a 59 y.o. year old, female patient, who returns today as an established patient. She has Chronic pain; Failed back surgical syndrome; Bilateral lower extremity pain; Chronic radicular lumbar pain; Opiate use; Opioid dependence (HCC); Long term prescription opiate use; Long term current use of opiate analgesic; Encounter for therapeutic drug level monitoring; Chronic low back pain; Chronic pain syndrome; Clinical depression; Type 2 diabetes mellitus (HCC); Essential (primary) hypertension; HLD (hyperlipidemia); Encounter for chronic pain management; Neurogenic pain; Neuropathic pain; and Musculoskeletal pain on her problem list.. Her primarily concern today is the Back Pain     The patient comes into the clinic's today for follow-up after having been started on MS Contin. The patient indicates doing great with the new medication with near complete relief of the pain and no side effects. Will then stay as is.  Today's Pain Score: 1  Reported level of pain is compatible with clinical observation Pain Type: Chronic pain Pain Location: Back Pain Orientation: Lower Pain Descriptors / Indicators: Sharp, Constant Pain Frequency: Constant  Date of Last Visit: 06/18/15 Service Provided on Last Visit: Med Refill  Pharmacotherapy Review:   Side-effects or Adverse reactions: None reported. Effectiveness: Described as relatively effective, allowing for increase in activities of daily living (ADL). Onset of action: Within expected pharmacological parameters. Duration of action: Within normal limits for medication. Peak effect: Timing and results are as within normal expected parameters. [place] PMP: Compliant with practice rules and regulations. Medication Assessment Form: Reviewed. Patient indicates being compliant with  therapy Treatment compliance: Compliant. Substance Use Disorder (SUD) Risk Level: Low Pharmacologic Plan: Continue therapy as is.  Last Available Lab Work: Admission on 05/12/2015, Discharged on 05/12/2015  Component Date Value Ref Range Status  . Sodium 05/12/2015 143  135 - 145 mmol/L Final  . Potassium 05/12/2015 4.0  3.5 - 5.1 mmol/L Final  . Chloride 05/12/2015 104  101 - 111 mmol/L Final  . CO2 05/12/2015 31  22 - 32 mmol/L Final  . Glucose, Bld 05/12/2015 119* 65 - 99 mg/dL Final  . BUN 40/98/1191 12  6 - 20 mg/dL Final  . Creatinine, Ser 05/12/2015 0.98  0.44 - 1.00 mg/dL Final  . Calcium 47/82/9562 10.1  8.9 - 10.3 mg/dL Final  . Total Protein 05/12/2015 7.3  6.5 - 8.1 g/dL Final  . Albumin 13/03/6577 4.0  3.5 - 5.0 g/dL Final  . AST 46/96/2952 26  15 - 41 U/L Final  . ALT 05/12/2015 15  14 - 54 U/L Final  . Alkaline Phosphatase 05/12/2015 71  38 - 126 U/L Final  . Total Bilirubin 05/12/2015 0.6  0.3 - 1.2 mg/dL Final  . GFR calc non Af Amer 05/12/2015 >60  >60 mL/min Final  . GFR calc Af Amer 05/12/2015 >60  >60 mL/min Final  . Anion gap 05/12/2015 8  5 - 15 Final  . WBC 05/12/2015 9.0  3.6 - 11.0 K/uL Final  . RBC 05/12/2015 4.55  3.80 - 5.20 MIL/uL Final  . Hemoglobin 05/12/2015 13.9  12.0 - 16.0 g/dL Final  . HCT 84/13/2440 40.5  35.0 - 47.0 % Final  . MCV 05/12/2015 89.0  80.0 - 100.0 fL Final  . MCH 05/12/2015 30.5  26.0 - 34.0 pg Final  . MCHC 05/12/2015 34.2  32.0 - 36.0 g/dL Final  . RDW 06/20/2535 14.2  11.5 - 14.5 % Final  . Platelets 05/12/2015 259  150 - 440 K/uL Final  . Neutrophils Relative % 05/12/2015 55   Final  . Neutro Abs 05/12/2015 5.0  1.4 - 6.5 K/uL Final  . Lymphocytes Relative 05/12/2015 36   Final  . Lymphs Abs 05/12/2015 3.2  1.0 - 3.6 K/uL Final  . Monocytes Relative 05/12/2015 6   Final  . Monocytes Absolute 05/12/2015 0.5  0.2 - 0.9 K/uL Final  . Eosinophils Relative 05/12/2015 2   Final  . Eosinophils Absolute 05/12/2015 0.2  0 - 0.7  K/uL Final  . Basophils Relative 05/12/2015 1   Final  . Basophils Absolute 05/12/2015 0.1  0 - 0.1 K/uL Final    Allergies: Tammy Acosta is allergic to penicillins and tetracyclines & related.  Meds: The patient has a current medication list which includes the following prescription(s): clonidine, gabapentin, lisinopril, metformin, morphine, simvastatin, tizanidine, venlafaxine, morphine, and morphine. Requested Prescriptions   Signed Prescriptions Disp Refills  . gabapentin (NEURONTIN) 600 MG tablet 90 tablet 2    Sig: Take 1 tablet (600 mg total) by mouth every 8 (eight) hours.  Marland Kitchen morphine (MS CONTIN) 15 MG 12 hr tablet 60 tablet 0    Sig: Take 1 tablet (15 mg total) by mouth every 12 (twelve) hours.  Marland Kitchen morphine (MS CONTIN) 15 MG 12 hr tablet 60 tablet 0    Sig: Take 1 tablet (15 mg total) by mouth every 12 (twelve) hours.  Marland Kitchen morphine (MS CONTIN) 15 MG 12 hr tablet 60 tablet 0    Sig: Take 1 tablet (15 mg total) by mouth every 12 (twelve) hours.    ROS: Constitutional: Afebrile, no chills, well hydrated and well nourished Gastrointestinal: negative Musculoskeletal:negative Neurological: negative Behavioral/Psych: negative  PFSH: Medical:  Tammy Acosta  has a past medical history of Diabetes mellitus without complication (HCC); Hypertension; Hyperlipidemia; Hypercholesteremia; and Spine disorder. Family: family history includes Heart disease in her father and mother; Hypertension in her mother. Surgical:  has past surgical history that includes Back surgery; Abdominal hysterectomy; Cholecystectomy; and Kyphoplasty. Tobacco:  reports that she has been smoking.  She does not have any smokeless tobacco history on file. Alcohol:  reports that she does not drink alcohol. Drug:  reports that she does not use illicit drugs.  Physical Exam: Vitals:  Today's Vitals   07/22/15 1349 07/22/15 1351  BP:  184/94  Pulse: 90   Temp: 98.6 F (37 C)   Resp: 18   Height: 5\' 3"  (1.6 m)    Weight: 152 lb (68.947 kg)   SpO2: 97%   PainSc: 1  1   PainLoc: Back   Calculated BMI: Body mass index is 26.93 kg/(m^2). General appearance: alert, cooperative, appears older than stated age, no distress and mildly obese Eyes: conjunctivae/corneas clear. PERRL, EOM's intact. Fundi benign. Lungs: No evidence respiratory distress, no audible rales or ronchi and no use of accessory muscles of respiration Neck: no adenopathy, no carotid bruit, no JVD, supple, symmetrical, trachea midline and thyroid not enlarged, symmetric, no tenderness/mass/nodules Back: symmetric, no curvature. ROM normal. No CVA tenderness. Extremities: extremities normal, atraumatic, no cyanosis or edema Pulses: 2+ and symmetric Skin: Skin color, texture, turgor normal. No rashes or lesions Neurologic: Grossly normal    Assessment: Encounter Diagnosis:  Primary Diagnosis: Chronic pain [G89.29]  Plan: Kayli was seen today for back pain.  Diagnoses and all orders for this visit:  Chronic pain -     gabapentin (NEURONTIN) 600 MG tablet;  Take 1 tablet (600 mg total) by mouth every 8 (eight) hours. -     morphine (MS CONTIN) 15 MG 12 hr tablet; Take 1 tablet (15 mg total) by mouth every 12 (twelve) hours. -     morphine (MS CONTIN) 15 MG 12 hr tablet; Take 1 tablet (15 mg total) by mouth every 12 (twelve) hours. -     morphine (MS CONTIN) 15 MG 12 hr tablet; Take 1 tablet (15 mg total) by mouth every 12 (twelve) hours. -     COMPLETE METABOLIC PANEL WITH GFR; Future -     C-reactive protein; Future -     Magnesium; Future -     Sedimentation rate; Future -     Vitamin D2,D3 Panel; Future  Encounter for therapeutic drug level monitoring  Encounter for chronic pain management  Opiate use  Long term prescription opiate use  Long term current use of opiate analgesic -     Drugs of abuse screen w/o alc, rtn urine-sln  Neurogenic pain -     gabapentin (NEURONTIN) 600 MG tablet; Take 1 tablet (600 mg total)  by mouth every 8 (eight) hours.  Neuropathic pain -     gabapentin (NEURONTIN) 600 MG tablet; Take 1 tablet (600 mg total) by mouth every 8 (eight) hours.  Musculoskeletal pain  Chronic low back pain  Bilateral lower extremity pain     Patient Instructions  Smoking Cessation, Tips for Success If you are ready to quit smoking, congratulations! You have chosen to help yourself be healthier. Cigarettes bring nicotine, tar, carbon monoxide, and other irritants into your body. Your lungs, heart, and blood vessels will be able to work better without these poisons. There are many different ways to quit smoking. Nicotine gum, nicotine patches, a nicotine inhaler, or nicotine nasal spray can help with physical craving. Hypnosis, support groups, and medicines help break the habit of smoking. WHAT THINGS CAN I DO TO MAKE QUITTING EASIER?  Here are some tips to help you quit for good:  Pick a date when you will quit smoking completely. Tell all of your friends and family about your plan to quit on that date.  Do not try to slowly cut down on the number of cigarettes you are smoking. Pick a quit date and quit smoking completely starting on that day.  Throw away all cigarettes.   Clean and remove all ashtrays from your home, work, and car.  On a card, write down your reasons for quitting. Carry the card with you and read it when you get the urge to smoke.  Cleanse your body of nicotine. Drink enough water and fluids to keep your urine clear or pale yellow. Do this after quitting to flush the nicotine from your body.  Learn to predict your moods. Do not let a bad situation be your excuse to have a cigarette. Some situations in your life might tempt you into wanting a cigarette.  Never have "just one" cigarette. It leads to wanting another and another. Remind yourself of your decision to quit.  Change habits associated with smoking. If you smoked while driving or when feeling stressed, try other  activities to replace smoking. Stand up when drinking your coffee. Brush your teeth after eating. Sit in a different chair when you read the paper. Avoid alcohol while trying to quit, and try to drink fewer caffeinated beverages. Alcohol and caffeine may urge you to smoke.  Avoid foods and drinks that can trigger a desire to smoke,  such as sugary or spicy foods and alcohol.  Ask people who smoke not to smoke around you.  Have something planned to do right after eating or having a cup of coffee. For example, plan to take a walk or exercise.  Try a relaxation exercise to calm you down and decrease your stress. Remember, you may be tense and nervous for the first 2 weeks after you quit, but this will pass.  Find new activities to keep your hands busy. Play with a pen, coin, or rubber band. Doodle or draw things on paper.  Brush your teeth right after eating. This will help cut down on the craving for the taste of tobacco after meals. You can also try mouthwash.   Use oral substitutes in place of cigarettes. Try using lemon drops, carrots, cinnamon sticks, or chewing gum. Keep them handy so they are available when you have the urge to smoke.  When you have the urge to smoke, try deep breathing.  Designate your home as a nonsmoking area.  If you are a heavy smoker, ask your health care provider about a prescription for nicotine chewing gum. It can ease your withdrawal from nicotine.  Reward yourself. Set aside the cigarette money you save and buy yourself something nice.  Look for support from others. Join a support group or smoking cessation program. Ask someone at home or at work to help you with your plan to quit smoking.  Always ask yourself, "Do I need this cigarette or is this just a reflex?" Tell yourself, "Today, I choose not to smoke," or "I do not want to smoke." You are reminding yourself of your decision to quit.  Do not replace cigarette smoking with electronic cigarettes  (commonly called e-cigarettes). The safety of e-cigarettes is unknown, and some may contain harmful chemicals.  If you relapse, do not give up! Plan ahead and think about what you will do the next time you get the urge to smoke. HOW WILL I FEEL WHEN I QUIT SMOKING? You may have symptoms of withdrawal because your body is used to nicotine (the addictive substance in cigarettes). You may crave cigarettes, be irritable, feel very hungry, cough often, get headaches, or have difficulty concentrating. The withdrawal symptoms are only temporary. They are strongest when you first quit but will go away within 10-14 days. When withdrawal symptoms occur, stay in control. Think about your reasons for quitting. Remind yourself that these are signs that your body is healing and getting used to being without cigarettes. Remember that withdrawal symptoms are easier to treat than the major diseases that smoking can cause.  Even after the withdrawal is over, expect periodic urges to smoke. However, these cravings are generally short lived and will go away whether you smoke or not. Do not smoke! WHAT RESOURCES ARE AVAILABLE TO HELP ME QUIT SMOKING? Your health care provider can direct you to community resources or hospitals for support, which may include:  Group support.  Education.  Hypnosis.  Therapy.   This information is not intended to replace advice given to you by your health care provider. Make sure you discuss any questions you have with your health care provider.   Document Released: 05/08/2004 Document Revised: 08/31/2014 Document Reviewed: 01/26/2013 Elsevier Interactive Patient Education 2016 [employer].    Medications discontinued today:  Medications Discontinued During This Encounter  Medication Reason  . venlafaxine (EFFEXOR) 75 MG tablet Error  . gabapentin (NEURONTIN) 300 MG capsule   . gabapentin (NEURONTIN) 600 MG tablet  Reorder  . morphine (MS CONTIN) 15 MG 12 hr tablet Reorder    Medications administered today:  [name] had no medications administered during this visit.  Primary Care Physician: Leotis ShamesSingh,Jasmine, MD [facility] Note by: Sydnee LevansFrancisco A. Laban EmperorNaveira, M.D, DABA, DABAPM, DABPM, DABIPP, FIPP

## 2015-07-22 NOTE — Progress Notes (Signed)
Morphine 15 mg pill count #2

## 2015-07-22 NOTE — Patient Instructions (Signed)
Smoking Cessation, Tips for Success If you are ready to quit smoking, congratulations! You have chosen to help yourself be healthier. Cigarettes bring nicotine, tar, carbon monoxide, and other irritants into your body. Your lungs, heart, and blood vessels will be able to work better without these poisons. There are many different ways to quit smoking. Nicotine gum, nicotine patches, a nicotine inhaler, or nicotine nasal spray can help with physical craving. Hypnosis, support groups, and medicines help break the habit of smoking. WHAT THINGS CAN I DO TO MAKE QUITTING EASIER?  Here are some tips to help you quit for good:  Pick a date when you will quit smoking completely. Tell all of your friends and family about your plan to quit on that date.  Do not try to slowly cut down on the number of cigarettes you are smoking. Pick a quit date and quit smoking completely starting on that day.  Throw away all cigarettes.   Clean and remove all ashtrays from your home, work, and car.  On a card, write down your reasons for quitting. Carry the card with you and read it when you get the urge to smoke.  Cleanse your body of nicotine. Drink enough water and fluids to keep your urine clear or pale yellow. Do this after quitting to flush the nicotine from your body.  Learn to predict your moods. Do not let a bad situation be your excuse to have a cigarette. Some situations in your life might tempt you into wanting a cigarette.  Never have "just one" cigarette. It leads to wanting another and another. Remind yourself of your decision to quit.  Change habits associated with smoking. If you smoked while driving or when feeling stressed, try other activities to replace smoking. Stand up when drinking your coffee. Brush your teeth after eating. Sit in a different chair when you read the paper. Avoid alcohol while trying to quit, and try to drink fewer caffeinated beverages. Alcohol and caffeine may urge you to  smoke.  Avoid foods and drinks that can trigger a desire to smoke, such as sugary or spicy foods and alcohol.  Ask people who smoke not to smoke around you.  Have something planned to do right after eating or having a cup of coffee. For example, plan to take a walk or exercise.  Try a relaxation exercise to calm you down and decrease your stress. Remember, you may be tense and nervous for the first 2 weeks after you quit, but this will pass.  Find new activities to keep your hands busy. Play with a pen, coin, or rubber band. Doodle or draw things on paper.  Brush your teeth right after eating. This will help cut down on the craving for the taste of tobacco after meals. You can also try mouthwash.   Use oral substitutes in place of cigarettes. Try using lemon drops, carrots, cinnamon sticks, or chewing gum. Keep them handy so they are available when you have the urge to smoke.  When you have the urge to smoke, try deep breathing.  Designate your home as a nonsmoking area.  If you are a heavy smoker, ask your health care provider about a prescription for nicotine chewing gum. It can ease your withdrawal from nicotine.  Reward yourself. Set aside the cigarette money you save and buy yourself something nice.  Look for support from others. Join a support group or smoking cessation program. Ask someone at home or at work to help you with your plan   to quit smoking.  Always ask yourself, "Do I need this cigarette or is this just a reflex?" Tell yourself, "Today, I choose not to smoke," or "I do not want to smoke." You are reminding yourself of your decision to quit.  Do not replace cigarette smoking with electronic cigarettes (commonly called e-cigarettes). The safety of e-cigarettes is unknown, and some may contain harmful chemicals.  If you relapse, do not give up! Plan ahead and think about what you will do the next time you get the urge to smoke. HOW WILL I FEEL WHEN I QUIT SMOKING? You  may have symptoms of withdrawal because your body is used to nicotine (the addictive substance in cigarettes). You may crave cigarettes, be irritable, feel very hungry, cough often, get headaches, or have difficulty concentrating. The withdrawal symptoms are only temporary. They are strongest when you first quit but will go away within 10-14 days. When withdrawal symptoms occur, stay in control. Think about your reasons for quitting. Remind yourself that these are signs that your body is healing and getting used to being without cigarettes. Remember that withdrawal symptoms are easier to treat than the major diseases that smoking can cause.  Even after the withdrawal is over, expect periodic urges to smoke. However, these cravings are generally short lived and will go away whether you smoke or not. Do not smoke! WHAT RESOURCES ARE AVAILABLE TO HELP ME QUIT SMOKING? Your health care provider can direct you to community resources or hospitals for support, which may include:  Group support.  Education.  Hypnosis.  Therapy.   This information is not intended to replace advice given to you by your health care provider. Make sure you discuss any questions you have with your health care provider.   Document Released: 05/08/2004 Document Revised: 08/31/2014 Document Reviewed: 01/26/2013 Elsevier Interactive Patient Education 2016 [employer]  

## 2015-07-27 LAB — TOXASSURE SELECT 13 (MW), URINE: PDF: 0

## 2015-08-05 ENCOUNTER — Emergency Department
Admission: EM | Admit: 2015-08-05 | Discharge: 2015-08-05 | Disposition: A | Discharge status: Home / Self Care | Payer: Medicare Other | Attending: Emergency Medicine | Admitting: Emergency Medicine

## 2015-08-05 ENCOUNTER — Emergency Department: Payer: Medicare Other

## 2015-08-05 DIAGNOSIS — I10 Essential (primary) hypertension: Secondary | ICD-10-CM | POA: Diagnosis not present

## 2015-08-05 DIAGNOSIS — Z7984 Long term (current) use of oral hypoglycemic drugs: Secondary | ICD-10-CM | POA: Diagnosis not present

## 2015-08-05 DIAGNOSIS — Y998 Other external cause status: Secondary | ICD-10-CM | POA: Insufficient documentation

## 2015-08-05 DIAGNOSIS — F17210 Nicotine dependence, cigarettes, uncomplicated: Secondary | ICD-10-CM | POA: Insufficient documentation

## 2015-08-05 DIAGNOSIS — S0502XA Injury of conjunctiva and corneal abrasion without foreign body, left eye, initial encounter: Secondary | ICD-10-CM | POA: Insufficient documentation

## 2015-08-05 DIAGNOSIS — X58XXXA Exposure to other specified factors, initial encounter: Secondary | ICD-10-CM | POA: Diagnosis not present

## 2015-08-05 DIAGNOSIS — Y9289 Other specified places as the place of occurrence of the external cause: Secondary | ICD-10-CM | POA: Insufficient documentation

## 2015-08-05 DIAGNOSIS — Z880 Allergy status to penicillin: Secondary | ICD-10-CM | POA: Insufficient documentation

## 2015-08-05 DIAGNOSIS — E119 Type 2 diabetes mellitus without complications: Secondary | ICD-10-CM | POA: Insufficient documentation

## 2015-08-05 DIAGNOSIS — Y9389 Activity, other specified: Secondary | ICD-10-CM | POA: Insufficient documentation

## 2015-08-05 DIAGNOSIS — S0592XA Unspecified injury of left eye and orbit, initial encounter: Secondary | ICD-10-CM | POA: Diagnosis present

## 2015-08-05 DIAGNOSIS — Z79899 Other long term (current) drug therapy: Secondary | ICD-10-CM | POA: Diagnosis not present

## 2015-08-05 LAB — CBC WITH DIFFERENTIAL/PLATELET
Basophils Absolute: 0 10*3/uL (ref 0–0.1)
Basophils Relative: 0 %
EOS PCT: 2 %
Eosinophils Absolute: 0.2 10*3/uL (ref 0–0.7)
HCT: 40.4 % (ref 35.0–47.0)
Hemoglobin: 13.5 g/dL (ref 12.0–16.0)
LYMPHS ABS: 3.8 10*3/uL — AB (ref 1.0–3.6)
Lymphocytes Relative: 36 %
MCH: 30.6 pg (ref 26.0–34.0)
MCHC: 33.3 g/dL (ref 32.0–36.0)
MCV: 91.9 fL (ref 80.0–100.0)
MONO ABS: 0.6 10*3/uL (ref 0.2–0.9)
Monocytes Relative: 6 %
NEUTROS PCT: 56 %
Neutro Abs: 5.9 10*3/uL (ref 1.4–6.5)
PLATELETS: 326 10*3/uL (ref 150–440)
RBC: 4.4 MIL/uL (ref 3.80–5.20)
RDW: 14.2 % (ref 11.5–14.5)
WBC: 10.6 10*3/uL (ref 3.6–11.0)

## 2015-08-05 LAB — BASIC METABOLIC PANEL
Anion gap: 9 (ref 5–15)
BUN: 14 mg/dL (ref 6–20)
CALCIUM: 9.5 mg/dL (ref 8.9–10.3)
CO2: 26 mmol/L (ref 22–32)
Chloride: 105 mmol/L (ref 101–111)
Creatinine, Ser: 0.88 mg/dL (ref 0.44–1.00)
GFR calc Af Amer: >60 mL/min (ref >60)
GLUCOSE: 129 mg/dL — AB (ref 65–99)
POTASSIUM: 3.7 mmol/L (ref 3.5–5.1)
Sodium: 140 mmol/L (ref 135–145)

## 2015-08-05 LAB — SEDIMENTATION RATE: Sed Rate: 63 mm/hr — ABNORMAL HIGH (ref 0–30)

## 2015-08-05 MED ORDER — ENALAPRILAT 1.25 MG/ML IV SOLN
1.2500 mg | Freq: Once | INTRAVENOUS | Status: AC
  Administered 2015-08-05: 1.25 mg via INTRAVENOUS
  Filled 2015-08-05: qty 2

## 2015-08-05 MED ORDER — CLONIDINE HCL 0.1 MG PO TABS
0.1000 mg | ORAL_TABLET | Freq: Once | ORAL | Status: AC
  Administered 2015-08-05: 0.1 mg via ORAL
  Filled 2015-08-05: qty 1

## 2015-08-05 MED ORDER — TETRACAINE HCL 0.5 % OP SOLN
OPHTHALMIC | Status: AC
  Administered 2015-08-05: 2 [drp] via OPHTHALMIC
  Filled 2015-08-05: qty 2

## 2015-08-05 MED ORDER — TETRACAINE HCL 0.5 % OP SOLN
2.0000 [drp] | Freq: Once | OPHTHALMIC | Status: AC
  Administered 2015-08-05: 2 [drp] via OPHTHALMIC

## 2015-08-05 MED ORDER — CLONIDINE HCL 0.1 MG/24HR TD PTWK: 0.1000 mg | MEDICATED_PATCH | Freq: Once | TRANSDERMAL | Status: DC

## 2015-08-05 NOTE — ED Provider Notes (Signed)
[facility] Emergency Department Provider Note  ____________________________________________  Time seen: Approximately 1:28 PM  I have reviewed the triage vital signs and the nursing notes.   HISTORY  Chief Complaint Eye Problem    HPI Tammy Acosta is a 59 y.o. female patient reports her left eye began bothering her about a week ago it came all runny in full of liquid. She's been wiping it out. About 3 days ago the cornea got cloudy in the eye got very injected and painful in her vision decreased. Patient is never had this before. 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  Past Medical History  Diagnosis Date  . Diabetes mellitus without complication (HCC)   . Hypertension   . Hyperlipidemia   . Hypercholesteremia   . Spine disorder     Patient Active Problem List   Diagnosis Date Noted  . Encounter for chronic pain management 07/22/2015  . Neurogenic pain 07/22/2015  . Neuropathic pain 07/22/2015  . Musculoskeletal pain 07/22/2015  . Chronic pain 06/18/2015  . 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 Bilateral lower extremity pain 06/18/2015  . Chronic radicular lumbar pain 06/18/2015  . Opiate use 06/18/2015  . Opioid dependence (HCC) 06/18/2015  . Long term prescription opiate use 06/18/2015  . Long term current use of opiate analgesic 06/18/2015  . Encounter for therapeutic drug level monitoring 06/18/2015  . Chronic low back pain 06/18/2015  . Chronic pain syndrome 06/18/2015  . Essential (primary) hypertension 05/21/2015  . HLD (hyperlipidemia) 02/13/2014  . Clinical depression 02/04/2014  . Type 2 diabetes mellitus (HCC) 02/04/2014    Past Surgical History  Procedure Laterality Date  . Back surgery    . Abdominal hysterectomy    . Cholecystectomy    . Kyphoplasty      Current Outpatient Rx  Name  Route  Sig  Dispense  Refill  .  cloNIDine (CATAPRES) 0.3 MG tablet   Oral   Take 0.3 mg by mouth 2 (two) times daily.         Marland Kitchen gabapentin (NEURONTIN) 600 MG tablet   Oral   Take 1 tablet (600 mg total) by mouth every 8 (eight) hours.   90 tablet   2     Do not place this medication, or any other prescri ...   . lisinopril (PRINIVIL,ZESTRIL) 20 MG tablet   Oral   Take 20 mg by mouth daily.         . metFORMIN (GLUCOPHAGE) 1000 MG tablet   Oral   Take 1,000 mg by mouth 2 (two) times daily with a meal.         . morphine (MS CONTIN) 15 MG 12 hr tablet   Oral   Take 1 tablet (15 mg total) by mouth every 12 (twelve) hours.   60 tablet   0     Do not place this medication, or any other prescri ...   . morphine (MS CONTIN) 15 MG 12 hr tablet   Oral   Take 1 tablet (15 mg total) by mouth every 12 (twelve) hours.   60 tablet   0     Do not place this medication, or any other prescri ...   . morphine (MS CONTIN) 15 MG 12 hr tablet   Oral   Take 1 tablet (15 mg total) by mouth every 12 (twelve) hours.   60 tablet  0     Do not place this medication, or any other prescri ...   . simvastatin (ZOCOR) 40 MG tablet   Oral   Take 40 mg by mouth every evening.         Marland Kitchen. tiZANidine (ZANAFLEX) 4 MG tablet   Oral   Take 4 mg by mouth 3 (three) times daily.         Marland Kitchen. venlafaxine (EFFEXOR) 75 MG tablet   Oral   Take 75 mg by mouth 3 (three) times daily.           Allergies Penicillins and Tetracyclines & related  Family History  Problem Relation Age of Onset  . Heart disease Mother   . Hypertension Mother   . Heart disease Father     Social History Social History  Substance Use Topics  . Smoking status: Current Every Day Smoker    Types: Cigarettes  . Smokeless tobacco: None  . Alcohol Use: No    Review of Systems Constitutional: No fever/chills Eyes: See H&P ENT: No sore throat. Cardiovascular: Denies chest pain. Respiratory: Denies shortness of breath. Gastrointestinal: No  abdominal pain.  No nausea, no vomiting.  No diarrhea.  No constipation. Genitourinary: Negative for dysuria. Musculoskeletal: Negative for back pain. Skin: Negative for rash. Neurological: Negative for headaches, focal weakness or numbness. } 10-point ROS otherwise negative.  ____________________________________________   PHYSICAL EXAM:  VITAL SIGNS: ED Triage Vitals  Enc Vitals Group     BP 08/05/15 0923 234/111 mmHg     Pulse Rate 08/05/15 0923 109     Resp 08/05/15 0923 17     Temp 08/05/15 0923 98.6 F (37 C)     Temp Source 08/05/15 0923 Oral     SpO2 08/05/15 0923 98 %     Weight 08/05/15 0923 158 lb (71.668 kg)     Height 08/05/15 0923 5\' 4"  (1.626 m)     Head Cir --      Peak Flow --      Pain Score 08/05/15 0924 4     Pain Loc --      Pain Edu? --      Excl. in GC? --    He was Constitutional: Alert and oriented. Well appearing and in no acute distress. Eyes: Conjunctivae are normal. PERRL. EOMI. Head: Atraumatic. Nose: No congestion/rhinnorhea. Mouth/Throat: Mucous membranes are moist.  Oropharynx non-erythematous. Neck: No stridor.   Cardiovascular: Normal rate, regular rhythm. Grossly normal heart sounds.  Good peripheral circulation. Respiratory: Normal respiratory effort.  No retractions. Lungs CTAB. Gastrointestinal: Soft and nontender. No distention. No abdominal bruits. No CVA tenderness. Musculoskeletal: No lower extremity tenderness nor edema.  No joint effusions. Neurologic:  Normal speech and language. No gross focal neurologic deficits are appreciated. No gait instability. Skin:  Skin is warm, dry and intact. No rash noted. Psychiatric: Mood and affect are normal. Speech and behavior are normal.  ____________________________________________   LABS (all labs ordered are listed, but only abnormal results are displayed)  Labs Reviewed  CBC WITH DIFFERENTIAL/PLATELET - Abnormal; Notable for the following:    Lymphs Abs 3.8 (*)    All other  components within normal limits  BASIC METABOLIC PANEL - Abnormal; Notable for the following:    Glucose, Bld 129 (*)    All other components within normal limits  SEDIMENTATION RATE   ____________________________________________  EKG   ____________________________________________  RADIOLOGY  ____________________________________________   PROCEDURES Intraocular pressure was measured 17 twice and 15 once. Dr. Druscilla BrowniePorfilio  was called and came in to see the patient and diagnosed with a corneal abrasion. Patient's blood pressure comes down with clonidine TTS one patch and the enalapril IV. Patient feels well otherwise except for her eye pain. Dr. Lindon Romp will put her on Vigamox eyedrops every hour and follow her up in the office within the next couple days. ____________________________________________   INITIAL IMPRESSION / ASSESSMENT AND PLAN / ED COURSE  Pertinent labs & imaging results that were available during my care of the patient were reviewed by me and considered in my medical decision making (see chart for details).   ____________________________________________   FINAL CLINICAL IMPRESSION(S) / ED DIAGNOSES  Final diagnoses:  Corneal abrasion, left, initial encounter      Arnaldo Natal, MD 08/05/15 [phone]

## 2015-08-05 NOTE — Discharge Instructions (Signed)
Corneal Abrasion The cornea is the clear covering at the front and center of the eye. When looking at the colored portion of the eye (iris), you are looking through the cornea. This very thin tissue is made up of many layers. The surface layer is a single layer of cells (corneal epithelium) and is one of the most sensitive tissues in the body. If a scratch or injury causes the corneal epithelium to come off, it is called a corneal abrasion. If the injury extends to the tissues below the epithelium, the condition is called a corneal ulcer. CAUSES   Scratches.  Trauma.  Foreign body in the eye. Some people have recurrences of abrasions in the area of the original injury even after it has healed (recurrent erosion syndrome). Recurrent erosion syndrome generally improves and goes away with time. SYMPTOMS   Eye pain.  Difficulty or inability to keep the injured eye open.  The eye becomes very sensitive to light.  Recurrent erosions tend to happen suddenly, first thing in the morning, usually after waking up and opening the eye. DIAGNOSIS  Your health care provider can diagnose a corneal abrasion during an eye exam. Dye is usually placed in the eye using a drop or a small paper strip moistened by your tears. When the eye is examined with a special light, the abrasion shows up clearly because of the dye. TREATMENT   Small abrasions may be treated with antibiotic drops or ointment alone.  A pressure patch may be put over the eye. If this is done, follow your doctor's instructions for when to remove the patch. Do not drive or use machines while the eye patch is on. Judging distances is hard to do with a patch on. If the abrasion becomes infected and spreads to the deeper tissues of the cornea, a corneal ulcer can result. This is serious because it can cause corneal scarring. Corneal scars interfere with light passing through the cornea and cause a loss of vision in the involved eye. HOME CARE  INSTRUCTIONS  Use medicine or ointment as directed. Only take over-the-counter or prescription medicines for pain, discomfort, or fever as directed by your health care provider.  Do not drive or operate machinery if your eye is patched. Your ability to judge distances is impaired.  If your health care provider has given you a follow-up appointment, it is very important to keep that appointment. Not keeping the appointment could result in a severe eye infection or permanent loss of vision. If there is any problem keeping the appointment, let your health care provider know. SEEK MEDICAL CARE IF:   You have pain, light sensitivity, and a scratchy feeling in one eye or both eyes.  Your pressure patch keeps loosening up, and you can blink your eye under the patch after treatment.  Any kind of discharge develops from the eye after treatment or if the lids stick together in the morning.  You have the same symptoms in the morning as you did with the original abrasion days, weeks, or months after the abrasion healed.   This information is not intended to replace advice given to you by your health care provider. Make sure you discuss any questions you have with your health care provider.   Document Released: 08/07/2000 Document Revised: 05/01/2015 Document Reviewed: 04/17/2013 Elsevier Interactive Patient Education 2016 [employer]  Please use antibiotic eyedrops that Dr. Lindon RompPorfirio gave you. Use them as he directed. Make sure to follow-up with him as he directed. Please return  for any further problems.

## 2015-08-05 NOTE — ED Notes (Signed)
Pt c/o left eye redness and drainage for the past 4 days with visual changes.. Pt is Hypertensive in triage, states she has been having issues with controlling her BP and her PCP is aware..Marland Kitchen

## 2015-09-04 ENCOUNTER — Ambulatory Visit (INDEPENDENT_AMBULATORY_CARE_PROVIDER_SITE_OTHER): Payer: Medicare Other | Admitting: Family Medicine

## 2015-09-04 VITALS — BP 180/100 | HR 83 | Temp 98.6°F | Ht 62.0 in | Wt 163.4 lb

## 2015-09-04 DIAGNOSIS — I10 Essential (primary) hypertension: Secondary | ICD-10-CM

## 2015-09-04 DIAGNOSIS — F32A Depression, unspecified: Secondary | ICD-10-CM

## 2015-09-04 DIAGNOSIS — F329 Major depressive disorder, single episode, unspecified: Secondary | ICD-10-CM | POA: Diagnosis not present

## 2015-09-04 MED ORDER — VENLAFAXINE HCL 37.5 MG PO TABS: 37.5000 mg | ORAL_TABLET | Freq: Every day | ORAL | Status: DC

## 2015-09-04 MED ORDER — HYDROCHLOROTHIAZIDE 25 MG PO TABS: 12.5000 mg | ORAL_TABLET | Freq: Every day | ORAL | Status: DC

## 2015-09-04 NOTE — Patient Instructions (Signed)
Nice to meet you.   we are going to start you on hydrochlorothiazide for your blood pressure. He should take this daily. We will check lab work today.  We're going to decrease your Effexor to 37.5 mg daily for the next several weeks  In an attempt to start a taper given that this medication can increase her blood pressure.   if you develop nausea, vomiting, diarrhea, headaches, lightheadedness, dizziness, diminished appetite, sweating, chills, tremors, paresthesias, fatigue, somnolence, and sleep disturbances, agitation, anxiety, akathisia, panic attacks, irritability, aggressiveness, worsening of mood, dysphoria, mood lability, hyperactivity, mania/hypomania, depersonalization, decreased concentration, slowed thinking, confusion, and memory or concentration difficulties please let us know.  If you develop chest pain, shortness of breath, numbness, weakness, vision changes, worsening headaches , thoughts of harming herself or others, or any new or changing symptoms please seek medical attention.

## 2015-09-04 NOTE — Progress Notes (Signed)
Patient ID: Tammy Acosta, female   DOB: 1955-08-31, 60 y.o.   MRN: [id number]  Tammy Rumps, MD Phone: [phone]  Tammy Acosta is a 60 y.o. female who presents today for new patient visit.  HYPERTENSION Disease Monitoring Home BP Monitoring  Does not check Chest pain-  no    Dyspnea-  no Medications Compliance-   Taking lisinopril and clonidine. Lightheadedness-   no  Edema-  No  Patient notes for the last 2-3 months she's had blood pressure issues. She's been seen in the emergency room several times and has been noted to be in the 917 range systolic. She notes she was started on lisinopril by her prior PCP. Clonidine was started in the emergency room. She notes she was previously on HCTZ though her most recent PCP stopped this for some reason that she is unaware of. She notes no cardiac symptoms , shortness of breath, vision changes, numbness, weakness, or swelling in her feet, she does note daily frontal headaches that can range from mild to moderate nature. She has a history of headaches are not as frequent. Note she has a mild headache now.  Patient does note she is followed by ophthalmology for poor vision in her left eye. Last saw them 2 weeks ago. She's had no acute vision changes. 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  Depression: Patient notes she's had depression for a long time. She's been on Effexor for at least the last 6-7 years. She had been on it 3 times daily, though over the last 2-3 weeks she's only been taking one a day as she was about to run out. As no adverse effects since coming down on her dose other than that her depression has worsened. No SI or HI.   Active Ambulatory Problems    Diagnosis Date Noted  . Chronic pain 06/18/2015  . 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 Bilateral lower extremity pain 06/18/2015  . Chronic radicular lumbar pain 06/18/2015  . Opiate use 06/18/2015   . Opioid dependence ([place]) 06/18/2015  . Long term prescription opiate use 06/18/2015  . Long term current use of opiate analgesic 06/18/2015  . Encounter for therapeutic drug level monitoring 06/18/2015  . Chronic low back pain 06/18/2015  . Chronic pain syndrome 06/18/2015  . Clinical depression 02/04/2014  . Type 2 diabetes mellitus ([place]) 02/04/2014  . Essential (primary) hypertension 05/21/2015  . HLD (hyperlipidemia) 02/13/2014  . Encounter for chronic pain management 07/22/2015  . Neurogenic pain 07/22/2015  . Neuropathic pain 07/22/2015  . Musculoskeletal pain 07/22/2015   Resolved Ambulatory Problems    Diagnosis Date Noted  . No Resolved Ambulatory Problems   Past Medical History  Diagnosis Date  . Diabetes mellitus without complication ([place])   . Hypertension   . Hyperlipidemia   . Hypercholesteremia   . Spine disorder   . Arthritis   . Depression   . Colon polyps     Family History  Problem Relation Age of Onset  . Heart disease Mother   . Hypertension Mother   . Heart disease Father   . Arthritis      Parent, other relative  . Colon cancer      Parent  . Breast cancer      Parent  . Lung cancer      Other relative  . Diabetes      Other relative  . Uterine  cancer      Other relative    Social History   Social History  . Marital Status: Divorced    Spouse Name: N/A  . Number of Children: N/A  . Years of Education: N/A   Occupational History  . Not on file.   Social History Main Topics  . Smoking status: Current Every Day Smoker    Types: Cigarettes  . Smokeless tobacco: Not on file  . Alcohol Use: No  . Drug Use: No  . Sexual Activity: Not on file   Other Topics Concern  . Not on file   Social History Narrative    ROS   General:  Negative for nexplained weight loss, fever Skin: Negative for new or changing mole, sore that won't heal HEENT: Negative for trouble hearing, trouble seeing, ringing in ears, mouth sores, hoarseness,  change in voice, dysphagia. CV:  Negative for chest pain, dyspnea, edema, palpitations Resp: Negative for cough, dyspnea, hemoptysis GI: Negative for nausea, vomiting, diarrhea, constipation, abdominal pain, melena, hematochezia. GU: Negative for dysuria, incontinence, urinary hesitance, hematuria, vaginal or penile discharge, polyuria, sexual difficulty, lumps in testicle or breasts MSK: Negative for muscle cramps or aches, joint pain or swelling Neuro: Positive for headaches, Negative for weakness, numbness, dizziness, passing out/fainting Psych: Positive for depression, Negative for anxiety, memory problems  Objective  Physical Exam Filed Vitals:   09/04/15 1516 09/04/15 1610  BP: 172/110 180/100  Pulse: 83   Temp: 98.6 F (37 C)     BP Readings from Last 3 Encounters:  09/04/15 180/100  08/05/15 166/88  07/22/15 184/94   Wt Readings from Last 3 Encounters:  09/04/15 163 lb 6.4 oz (74.118 kg)  08/05/15 158 lb (71.668 kg)  07/22/15 152 lb (68.947 kg)    Physical Exam  Constitutional: She is well-developed, well-nourished, and in no distress.  HENT:  Head: Normocephalic and atraumatic.  Right Ear: External ear normal.  Left Ear: External ear normal.  Mouth/Throat: Oropharynx is clear and moist. No oropharyngeal exudate.  Eyes: Conjunctivae are normal. Pupils are equal, round, and reactive to light.  Fundus exam attempted, though difficult given pupillary constriction  Neck: Neck supple.  Cardiovascular: Normal rate, regular rhythm and normal heart sounds.  Exam reveals no gallop and no friction rub.   No murmur heard. Pulmonary/Chest: Effort normal and breath sounds normal. No respiratory distress. She has no wheezes. She has no rales.  Abdominal: Soft. Bowel sounds are normal. She exhibits no distension. There is no tenderness. There is no rebound and no guarding.  Musculoskeletal: She exhibits no edema.  Lymphadenopathy:    She has no cervical adenopathy.    Neurological: She is alert.  See vision screening, visual fields intact, CN 2-12 intact, 5/5 strength in bilateral biceps, triceps, grip, quads, hamstrings, plantar and dorsiflexion, sensation to light touch intact in bilateral UE and LE, normal gait, 2+ patellar reflexes, negative Romberg, no pronator drift  Skin: Skin is warm and dry. She is not diaphoretic.  Psychiatric:  Mood depressed, affect depressed     Assessment/Plan:   Essential (primary) hypertension Blood pressure is quite elevated today. Has been elevated for at least the past several months based on review of her ED visits. She does have mild headache, though no end organ damage symptoms at this time. Vision is abnormal in the left eye, though she reports this is a chronic issue and is unchanged recently. She is neurologically intact. She has been on Effexor which can cause an increase in blood  pressure. She is currently taking clonidine and does take this daily so doubt rebound hypertension. She is also taking lisinopril. Given her blood pressure today we will start her on hydrochlorothiazide 12.5 mg daily. We'll check a CMP to evaluate for renal dysfunction. She will start checking her blood pressure at home. She is given parameters. We'll taper her off of her Effexor as well. She is given return precautions.  Clinical depression Depression not well controlled on current Effexor dose. No SI or HI. I suspect that the Effexor could be contributing to her blood pressure and her blood pressure would preclude [place] from going up on this thus we need to taper her off Effexor so that we can start her on another medication for depression. She will decrease her dose to 37.5 mg daily for 2 weeks. She'll follow-up with me in a week to see how she is doing and then consider further taper. She's given symptoms of withdrawal. If she develops symptoms she was advised to give [place] a call and to increase back to 75 mg daily. Did discuss sending her to a  therapist, though she declined this. Given return precautions.    Orders Placed This Encounter  Procedures  . Comp Met (CMET)    Standing Status: Future     Number of Occurrences: 1     Standing Expiration Date: 09/03/2016    Meds ordered this encounter  Medications  . venlafaxine (EFFEXOR) 37.5 MG tablet    Sig: Take 1 tablet (37.5 mg total) by mouth daily with breakfast.    Dispense:  30 tablet    Refill:  0  . hydrochlorothiazide (HYDRODIURIL) 25 MG tablet    Sig: Take 0.5 tablets (12.5 mg total) by mouth daily.    Dispense:  90 tablet    Refill:  3    Dragon voice recognition software was used during the dictation process of this note. If any phrases or words seem inappropriate it is likely secondary to the translation process being inefficient.  Tammy Acosta

## 2015-09-04 NOTE — Progress Notes (Signed)
Pre visit review using our clinic review tool, if applicable. No additional management support is needed unless otherwise documented below in the visit note. 

## 2015-09-05 ENCOUNTER — Encounter: Payer: Self-pay | Admitting: Family Medicine

## 2015-09-05 LAB — COMPREHENSIVE METABOLIC PANEL
ALBUMIN: 4.5 g/dL (ref 3.5–5.2)
ALK PHOS: 71 U/L (ref 39–117)
ALT: 12 U/L (ref 0–35)
AST: 15 U/L (ref 0–37)
BILIRUBIN TOTAL: 0.2 mg/dL (ref 0.2–1.2)
BUN: 14 mg/dL (ref 6–23)
CALCIUM: 10.2 mg/dL (ref 8.4–10.5)
CHLORIDE: 100 meq/L (ref 96–112)
CO2: 30 meq/L (ref 19–32)
Creatinine, Ser: 0.89 mg/dL (ref 0.40–1.20)
GFR: 68.65 mL/min (ref >60.00)
Glucose, Bld: 128 mg/dL — ABNORMAL HIGH (ref 70–99)
POTASSIUM: 4.8 meq/L (ref 3.5–5.1)
Sodium: 140 mEq/L (ref 135–145)
Total Protein: 7.4 g/dL (ref 6.0–8.3)

## 2015-09-05 NOTE — Assessment & Plan Note (Addendum)
Blood pressure is quite elevated today. Has been elevated for at least the past several months based on review of her ED visits. She does have mild headache, though no end organ damage symptoms at this time. Vision is abnormal in the left eye, though she reports this is a chronic issue and is unchanged recently. She is neurologically intact. She has been on Effexor which can cause an increase in blood pressure. She is currently taking clonidine and does take this daily so doubt rebound hypertension. She is also taking lisinopril. Given her blood pressure today we will start her on hydrochlorothiazide 12.5 mg daily. We'll check a CMP to evaluate for renal dysfunction. She will start checking her blood pressure at home. She is given parameters. We'll taper her off of her Effexor as well. She is given return precautions.

## 2015-09-05 NOTE — Assessment & Plan Note (Signed)
Depression not well controlled on current Effexor dose. No SI or HI. I suspect that the Effexor could be contributing to her blood pressure and her blood pressure would preclude us from going up on this thus we need to taper her off Effexor so that we can start her on another medication for depression. She will decrease her dose to 37.5 mg daily for 2 weeks. She'll follow-up with me in a week to see how she is doing and then consider further taper. She's given symptoms of withdrawal. If she develops symptoms she was advised to give us a call and to increase back to 75 mg daily. Did discuss sending her to a therapist, though she declined this. Given return precautions.

## 2015-09-09 ENCOUNTER — Telehealth: Payer: Self-pay | Admitting: Family Medicine

## 2015-09-09 NOTE — Telephone Encounter (Signed)
Pt called stating her dad passed away last night so she will not be able to come in for her appt on 09/11/2015. Thank You!

## 2015-09-09 NOTE — Telephone Encounter (Signed)
noted 

## 2015-09-11 ENCOUNTER — Ambulatory Visit: Payer: Medicare Other | Admitting: Family Medicine

## 2015-09-23 ENCOUNTER — Telehealth: Payer: Self-pay

## 2015-09-23 NOTE — Telephone Encounter (Signed)
Pt needs tizanidine to be sent to the pharmacy

## 2015-09-25 ENCOUNTER — Telehealth: Payer: Self-pay

## 2015-09-25 NOTE — Telephone Encounter (Signed)
Information given to Dr Laban Emperor regarding prescription.

## 2015-09-30 ENCOUNTER — Ambulatory Visit (INDEPENDENT_AMBULATORY_CARE_PROVIDER_SITE_OTHER): Payer: Medicare Other | Admitting: Family Medicine

## 2015-09-30 ENCOUNTER — Encounter: Payer: Self-pay | Admitting: Family Medicine

## 2015-09-30 VITALS — BP 128/86 | HR 90 | Temp 98.8°F | Ht 62.0 in | Wt 162.2 lb

## 2015-09-30 DIAGNOSIS — F329 Major depressive disorder, single episode, unspecified: Secondary | ICD-10-CM | POA: Diagnosis not present

## 2015-09-30 DIAGNOSIS — G8929 Other chronic pain: Secondary | ICD-10-CM

## 2015-09-30 DIAGNOSIS — M545 Low back pain, unspecified: Secondary | ICD-10-CM

## 2015-09-30 DIAGNOSIS — F32A Depression, unspecified: Secondary | ICD-10-CM

## 2015-09-30 DIAGNOSIS — E119 Type 2 diabetes mellitus without complications: Secondary | ICD-10-CM

## 2015-09-30 DIAGNOSIS — I10 Essential (primary) hypertension: Secondary | ICD-10-CM | POA: Diagnosis not present

## 2015-09-30 LAB — BASIC METABOLIC PANEL
BUN: 20 mg/dL (ref 6–23)
CALCIUM: 9.6 mg/dL (ref 8.4–10.5)
CO2: 28 meq/L (ref 19–32)
CREATININE: 0.9 mg/dL (ref 0.40–1.20)
Chloride: 98 mEq/L (ref 96–112)
GFR: 67.75 mL/min (ref >60.00)
GLUCOSE: 162 mg/dL — AB (ref 70–99)
Potassium: 4.7 mEq/L (ref 3.5–5.1)
Sodium: 135 mEq/L (ref 135–145)

## 2015-09-30 LAB — HEMOGLOBIN A1C: Hgb A1c MFr Bld: 7.4 % — ABNORMAL HIGH (ref 4.6–6.5)

## 2015-09-30 NOTE — Progress Notes (Signed)
Pre visit review using our clinic review tool, if applicable. No additional management support is needed unless otherwise documented below in the visit note. 

## 2015-09-30 NOTE — Patient Instructions (Signed)
Nice to see you. Your blood pressure is much improved. We will continue current medications. We will recheck her kidney function today. We will check an A1c for diabetes. Please continue your current Effexor dose. We'll consider decreasing this is a later date. If you develop numbness, weakness, loss of bowel or bladder function, numbness between her legs, fevers, thoughts of harming herself or others, or worsening depression please seek medical attention.

## 2015-10-01 NOTE — Progress Notes (Signed)
Patient ID: HINLEY BRIMAGE, female   DOB: 1956/08/14, 60 y.o.   MRN: [id number]  [name], MD Phone: [phone]  Tammy Acosta is a 60 y.o. female who presents today for follow-up.  HYPERTENSION Disease Monitoring Home BP Monitoring 130's/80s Chest pain- no    Dyspnea- no Medications Compliance-  Taking clonidine, HCTZ, and lisinopril.   Edema- no  Depression: Patient notes this is mildly worse. She notes her father died shortly after we last saw each other in the office. She's been taking Effexor 37.5 mg every day. She notes this does help some. We were going to attempt to wean her off of this given her blood pressure issues. She's been talking to people through hospice. No SI or HI.  Low back pain: Patient notes history of low back surgery after fracturing L1/L2. She notes intermittently her back will hurt her. She does note chronic decreased sensation in her right lateral thigh that has not changed. She's gotten epidurals in the past for this. She takes morphine daily for this that is managed by pain management. Uses a heating pad. She denies other numbness, and weakness, saddle anesthesia, loss of bowel and bladder function, fevers, and history of cancer. She notes this issue is stable.  DIABETES Disease Monitoring: Blood Sugar ranges-unable to give specific ranges states they're typically good Polyuria/phagia/dipsia- no       Medications: Compliance- taking metformin Hypoglycemic symptoms- no  PMH: Smoker.   ROS see history of present illness  Objective  Physical Exam Filed Vitals:   09/30/15 1429 09/30/15 1451  BP: 148/88 128/86  Pulse: 90   Temp: 98.8 F (37.1 C)     BP Readings from Last 3 Encounters:  09/30/15 128/86  09/04/15 180/100  08/05/15 166/88   Physical Exam  Constitutional: She is well-developed, well-nourished, and in no distress.  HENT:  Head: Normocephalic and atraumatic.  Right Ear: External ear normal.  Left Ear: External ear  normal.  Mouth/Throat: Oropharynx is clear and moist. No oropharyngeal exudate.  Cardiovascular: Normal rate, regular rhythm and normal heart sounds.  Exam reveals no gallop and no friction rub.   No murmur heard. Pulmonary/Chest: Effort normal and breath sounds normal. No respiratory distress. She has no wheezes. She has no rales.  Musculoskeletal:  No midline spine tenderness, mild muscular low back tenderness, no swelling in her back  Neurological: She is alert.  5 out of 5 strength bilateral quads, hamstrings, plantar flexion, and dorsiflexion, sensation light touch intact bilaterally extremity is, 2+ patellar reflexes  Skin: Skin is warm and dry. She is not diaphoretic.     Assessment/Plan: Please see individual problem list.  Type 2 diabetes mellitus (HCC) Check A1c today.  Essential (primary) hypertension Much improved on recheck. Near goal at home. We will continue current regimen. She'll continue to monitor at home. If she continues to run greater than 130/90 would consider increasing medications.  Chronic low back pain Chronic issue. Stable. Neurologically at baseline. No red flags. Continue management by pain management. Given return precautions.  Clinical depression Slightly worse following the death of her father. Given slight worsening we will leave her Effexor as is at this time. She will follow-up in several weeks to see how she is doing and consider further taper. She'll continue to talk to a counselor through hospice. She is given return precautions.    Orders Placed This Encounter  Procedures  . Basic Metabolic Panel (BMET)  . HgB A1c     [name]

## 2015-10-02 ENCOUNTER — Other Ambulatory Visit: Payer: Self-pay | Admitting: Family Medicine

## 2015-10-02 MED ORDER — GLIPIZIDE 5 MG PO TABS: 5.0000 mg | ORAL_TABLET | Freq: Every day | ORAL | Status: DC

## 2015-10-02 NOTE — Assessment & Plan Note (Signed)
Chronic issue. Stable. Neurologically at baseline. No red flags. Continue management by pain management. Given return precautions.

## 2015-10-02 NOTE — Assessment & Plan Note (Signed)
Slightly worse following the death of her father. Given slight worsening we will leave her Effexor as is at this time. She will follow-up in several weeks to see how she is doing and consider further taper. She'll continue to talk to a counselor through hospice. She is given return precautions.

## 2015-10-02 NOTE — Assessment & Plan Note (Signed)
Check A1c today.

## 2015-10-02 NOTE — Assessment & Plan Note (Signed)
Much improved on recheck. Near goal at home. We will continue current regimen. She'll continue to monitor at home. If she continues to run greater than 130/90 would consider increasing medications.

## 2015-10-03 ENCOUNTER — Other Ambulatory Visit: Payer: Self-pay | Admitting: Family Medicine

## 2015-10-03 ENCOUNTER — Telehealth: Payer: Self-pay | Admitting: *Deleted

## 2015-10-03 MED ORDER — VENLAFAXINE HCL 37.5 MG PO TABS: 37.5000 mg | ORAL_TABLET | Freq: Every day | ORAL | Status: DC

## 2015-10-03 MED ORDER — GLUCOSE BLOOD VI STRP: ORAL_STRIP | Status: DC

## 2015-10-03 MED ORDER — ONETOUCH LANCETS MISC: Status: DC

## 2015-10-03 MED ORDER — BLOOD GLUCOSE METER KIT: PACK | Status: AC

## 2015-10-03 NOTE — Telephone Encounter (Signed)
Patient has requested a Rx for her test strips and lancets   Machine-one touch ultra 2 Pharmacy [facility] in [place]

## 2015-10-03 NOTE — Telephone Encounter (Signed)
Sent prescriptions as requested.

## 2015-10-21 ENCOUNTER — Ambulatory Visit: Payer: Medicare Other | Attending: Pain Medicine | Admitting: Pain Medicine

## 2015-10-21 ENCOUNTER — Encounter: Payer: Self-pay | Admitting: Pain Medicine

## 2015-10-21 VITALS — HR 88 | Temp 98.2°F | Resp 18 | Ht 62.0 in | Wt 158.0 lb

## 2015-10-21 DIAGNOSIS — R200 Anesthesia of skin: Secondary | ICD-10-CM | POA: Diagnosis not present

## 2015-10-21 DIAGNOSIS — M791 Myalgia: Secondary | ICD-10-CM | POA: Diagnosis not present

## 2015-10-21 DIAGNOSIS — F17210 Nicotine dependence, cigarettes, uncomplicated: Secondary | ICD-10-CM | POA: Diagnosis not present

## 2015-10-21 DIAGNOSIS — Z79891 Long term (current) use of opiate analgesic: Secondary | ICD-10-CM | POA: Diagnosis not present

## 2015-10-21 DIAGNOSIS — Z7984 Long term (current) use of oral hypoglycemic drugs: Secondary | ICD-10-CM | POA: Diagnosis not present

## 2015-10-21 DIAGNOSIS — E785 Hyperlipidemia, unspecified: Secondary | ICD-10-CM | POA: Insufficient documentation

## 2015-10-21 DIAGNOSIS — M79606 Pain in leg, unspecified: Secondary | ICD-10-CM | POA: Diagnosis not present

## 2015-10-21 DIAGNOSIS — Z5181 Encounter for therapeutic drug level monitoring: Secondary | ICD-10-CM

## 2015-10-21 DIAGNOSIS — G8929 Other chronic pain: Secondary | ICD-10-CM

## 2015-10-21 DIAGNOSIS — E1140 Type 2 diabetes mellitus with diabetic neuropathy, unspecified: Secondary | ICD-10-CM | POA: Insufficient documentation

## 2015-10-21 DIAGNOSIS — M160 Bilateral primary osteoarthritis of hip: Secondary | ICD-10-CM | POA: Diagnosis not present

## 2015-10-21 DIAGNOSIS — I10 Essential (primary) hypertension: Secondary | ICD-10-CM | POA: Diagnosis not present

## 2015-10-21 DIAGNOSIS — M5416 Radiculopathy, lumbar region: Secondary | ICD-10-CM | POA: Diagnosis not present

## 2015-10-21 DIAGNOSIS — F329 Major depressive disorder, single episode, unspecified: Secondary | ICD-10-CM | POA: Insufficient documentation

## 2015-10-21 DIAGNOSIS — M545 Low back pain, unspecified: Secondary | ICD-10-CM

## 2015-10-21 DIAGNOSIS — E1142 Type 2 diabetes mellitus with diabetic polyneuropathy: Secondary | ICD-10-CM

## 2015-10-21 DIAGNOSIS — M546 Pain in thoracic spine: Secondary | ICD-10-CM | POA: Diagnosis present

## 2015-10-21 DIAGNOSIS — M25559 Pain in unspecified hip: Secondary | ICD-10-CM | POA: Diagnosis not present

## 2015-10-21 DIAGNOSIS — M792 Neuralgia and neuritis, unspecified: Secondary | ICD-10-CM

## 2015-10-21 LAB — COMPREHENSIVE METABOLIC PANEL
ALT: 16 U/L (ref 14–54)
ANION GAP: 11 (ref 5–15)
AST: 23 U/L (ref 15–41)
Albumin: 4.5 g/dL (ref 3.5–5.0)
Alkaline Phosphatase: 65 U/L (ref 38–126)
BILIRUBIN TOTAL: 0.5 mg/dL (ref 0.3–1.2)
BUN: 19 mg/dL (ref 6–20)
CALCIUM: 9.1 mg/dL (ref 8.9–10.3)
CO2: 27 mmol/L (ref 22–32)
Chloride: 101 mmol/L (ref 101–111)
Creatinine, Ser: 1.05 mg/dL — ABNORMAL HIGH (ref 0.44–1.00)
GFR, EST NON AFRICAN AMERICAN: 57 mL/min — AB (ref >60)
GLUCOSE: 82 mg/dL (ref 65–99)
POTASSIUM: 4 mmol/L (ref 3.5–5.1)
Sodium: 139 mmol/L (ref 135–145)
Total Protein: 8.2 g/dL — ABNORMAL HIGH (ref 6.5–8.1)

## 2015-10-21 LAB — SEDIMENTATION RATE: SED RATE: 18 mm/h (ref 0–30)

## 2015-10-21 LAB — MAGNESIUM: Magnesium: 1.5 mg/dL — ABNORMAL LOW (ref 1.7–2.4)

## 2015-10-21 LAB — C-REACTIVE PROTEIN: CRP: 1.2 mg/dL — ABNORMAL HIGH (ref <1.0)

## 2015-10-21 MED ORDER — MORPHINE SULFATE ER 15 MG PO TBCR: 15.0000 mg | EXTENDED_RELEASE_TABLET | Freq: Two times a day (BID) | ORAL | Status: DC

## 2015-10-21 MED ORDER — GABAPENTIN 600 MG PO TABS: 600.0000 mg | ORAL_TABLET | Freq: Three times a day (TID) | ORAL | Status: DC

## 2015-10-21 MED ORDER — GABAPENTIN 600 MG PO TABS: 600.0000 mg | ORAL_TABLET | Freq: Four times a day (QID) | ORAL | Status: DC

## 2015-10-21 NOTE — Progress Notes (Signed)
Patient's Name: Tammy Acosta MRN: [id number] DOB: Jun 29, 1956 DOS: 10/21/2015  Primary Reason(s) for Visit: Encounter for Medication Management CC: Back Pain and Hip Pain   HPI  Tammy Acosta is a 60 y.o. year old, female patient, who returns today as an established patient. She has Chronic pain; Failed back surgical syndrome; Chronic lumbar radicular pain and numbness (Location of Secondary source of pain) (Bilateral) (R>L); Opiate use; Opioid dependence ([place]); Long term prescription opiate use; Long term current use of opiate analgesic; Encounter for therapeutic drug level monitoring; Chronic low back pain (Location of Primary Source of Pain) (Bilateral) (L>R); Chronic pain syndrome; Clinical depression; Type 2 diabetes mellitus ([place]); Essential (primary) hypertension; HLD (hyperlipidemia); Encounter for chronic pain management; Neurogenic pain; Neuropathic pain; Musculoskeletal pain; Chronic pain of lower extremity (Location of Secondary source of pain) (Bilateral) (R>L); Chronic hip pain (Location of Tertiary source of pain) (Bilateral) (L>R); Osteoarthritis of hip (Location of Tertiary source of pain) (Bilateral) (L>R); and Diabetic peripheral neuropathy ([place]) on her problem list.. Her primarily concern today is the Back Pain and Hip Pain   The patient comes in today clinics today for pharmacological management of her chronic pain. The patient indicates that her worst pain is in the lower back with the left side being worst on the right. This is followed by the pain in both lower extremities with the right being worst on the left. In the case of the right lower extremity the pain goes down to the knee through the lateral aspect of the leg. In the case of the left lower extremity, the pain goes down to the knee but this time through the back of the leg. In addition to this the patient has numbness in both of her feet through the bottom in what seems to be an S1 dermatomal distribution versus a  possible diabetic peripheral neuropathy. We'll be ordering an nerve conduction test to determine which one of the 2 is affecting her. She does respond well to the Neurontin and today we will increase it to 4 times a day. The patient's last imaging study of her lumbar spine was in 2010 when she had a CT scan done. Today we will order an MRI of the lumbar spine. In addition, the patient had evidence today, on her physical exam, of a bilateral hip pain. The patient has no imaging studies of this area. Today we will order x-rays of this area.  We will have the patient return to clinic after she has completed these tests.  Pain Assessment: Self-Reported Pain Score: 2  Reported level is compatible with observation Pain Type: Chronic pain Pain Location: Back (hips) Pain Orientation: Lower Pain Descriptors / Indicators: Aching, Sharp Pain Frequency: Constant  Date of Last Visit: 07/22/15 Service Provided on Last Visit: Med Refill  Controlled Substance Pharmacotherapy Assessment  Analgesic: MS Contin 15 mg every 12 hours (30 mg/day) MME/day: 30 mg/day Pharmacokinetics: Onset of action (Liberation/Absorption): Within expected pharmacological parameters. (2 hours) Time to Peak effect (Distribution): Timing and results are as within normal expected parameters. (3-4 hours) Duration of action (Metabolism/Excretion): Within normal limits for medication. (6 hours) Pharmacodynamics: Analgesic Effect: 60-75% Activity Facilitation: Medication(s) allow patient to sit, stand, walk, and do the basic ADLs Perceived Effectiveness: Described as relatively effective, allowing for increase in activities of daily living (ADL) Side-effects or Adverse reactions: None reported Monitoring: [place] PMP: Compliant with practice rules and regulations UDS Results/interpretation: The patient's last UDS was done on 07/22/2015 and it came back within normal limits with  no unexpected results. Medication Assessment Form: Reviewed.  Patient indicates being compliant with therapy Treatment compliance: Compliant Risk Assessment: Substance Use Disorder (SUD) Risk Level: Low Opioid Risk Tool (ORT) Score: Total Score: 1 Low Risk for SUD (Score <3) Depression Scale Score:    Pharmacologic Plan: Continue therapy as is  Lab Work: Illicit Drugs No results found for: THCU, COCAINSCRNUR, PCPSCRNUR, MDMA, AMPHETMU, METHADONE, ETOH  Inflammation Markers Lab Results  Component Value Date   ESRSEDRATE 63* 08/05/2015    Renal Function Lab Results  Component Value Date   BUN 20 09/30/2015   CREATININE 0.90 09/30/2015   GFRAA >60 08/05/2015   GFRNONAA >60 08/05/2015    Hepatic Function Lab Results  Component Value Date   AST 15 09/04/2015   ALT 12 09/04/2015   ALBUMIN 4.5 09/04/2015    Electrolytes Lab Results  Component Value Date   NA 135 09/30/2015   K 4.7 09/30/2015   CL 98 09/30/2015   CALCIUM 9.6 09/30/2015   MG 1.6* 11/06/2013    Allergies  Tammy Acosta is allergic to penicillins and tetracyclines & related.  Meds  The patient has a current medication list which includes the following prescription(s): blood glucose meter kit and supplies, one touch ultra 2, clonidine, gabapentin, glipizide, hydrochlorothiazide, lisinopril, metformin, morphine, simvastatin, tizanidine, venlafaxine, acetaminophen, morphine, and morphine.  Current Outpatient Prescriptions on File Prior to Visit  Medication Sig  . blood glucose meter kit and supplies Dispense One touch meter.  E11.9  . cloNIDine (CATAPRES) 0.3 MG tablet Take 0.3 mg by mouth 2 (two) times daily.  Marland Kitchen glipiZIDE (GLUCOTROL) 5 MG tablet Take 1 tablet (5 mg total) by mouth daily before breakfast.  . hydrochlorothiazide (HYDRODIURIL) 25 MG tablet Take 0.5 tablets (12.5 mg total) by mouth daily.  Marland Kitchen lisinopril (PRINIVIL,ZESTRIL) 20 MG tablet Take 20 mg by mouth daily.  . metFORMIN (GLUCOPHAGE) 1000 MG tablet Take 1,000 mg by mouth 2 (two) times daily with a  meal.  . simvastatin (ZOCOR) 40 MG tablet Take 40 mg by mouth every evening.  Marland Kitchen tiZANidine (ZANAFLEX) 4 MG tablet Take 4 mg by mouth 3 (three) times daily.  Marland Kitchen venlafaxine (EFFEXOR) 37.5 MG tablet Take 1 tablet (37.5 mg total) by mouth daily with breakfast.   No current facility-administered medications on file prior to visit.    ROS  Constitutional: Afebrile, no chills, well hydrated and well nourished Gastrointestinal: negative Musculoskeletal:negative Neurological: negative Behavioral/Psych: negative  PFSH  Medical:  Tammy Acosta  has a past medical history of Diabetes mellitus without complication ([place]); Hypertension; Hyperlipidemia; Hypercholesteremia; Spine disorder; Arthritis; Depression; and Colon polyps. Family: family history includes Heart disease in her father and mother; Hypertension in her mother. Surgical:  has past surgical history that includes Back surgery; Abdominal hysterectomy; Cholecystectomy; and Kyphoplasty. Tobacco:  reports that she has been smoking Cigarettes.  She does not have any smokeless tobacco history on file. Alcohol:  reports that she does not drink alcohol. Drug:  reports that she does not use illicit drugs.  Physical Exam  Vitals:  Today's Vitals   10/21/15 1308 10/21/15 1309  Pulse: 88   Temp: 98.2 F (36.8 C)   Resp: 18   Height: _0  (1.575 m)   Weight: 158 lb (71.668 kg)   SpO2: 97%   PainSc: 2  2   PainLoc: Back     Calculated BMI: Body mass index is 28.89 kg/(m^2).  General appearance: alert, cooperative, appears stated age, mild distress and mildly obese Eyes: PERLA Respiratory: No  evidence respiratory distress, no audible rales or ronchi and no use of accessory muscles of respiration  Lumbar Spine Exam  Inspection: Evidence of previous lumbar spine surgery detected. Well healed scar Alignment: Symetrical Palpation: Tender ROM:  Flexion: Decreased ROM Extension: Decreased ROM Lateral Bending: Decreased ROM Rotation:  Decreased ROM Provocative Tests:  Lumbar Hyperextension and rotation test:  Positive for facet pain, bilaterally Patrick's Maneuver: Positive for hip joint pain, bilaterally  Gait Evaluation  Gait: Antalgic (limping)  Lower Extremity Exam  Inspection: No gross anomalies detected ROM: Adequate Sensory:  Normal Motor: Grossly intact  Toe walk (S1): WNL  Heal walk (L5): WNL  Provocative Tests:  Straight Leg Raise (SLR) test: Low back and lower extremity pain Angle: (+) <30 degrees, bilaterally   Assessment & Plan  Primary Diagnosis & Pertinent Problem List: The primary encounter diagnosis was Chronic pain. Diagnoses of Encounter for therapeutic drug level monitoring, Long term current use of opiate analgesic, Chronic low back pain, Neurogenic pain, Neuropathic pain, Chronic pain of lower extremity, unspecified laterality, Chronic hip pain, unspecified laterality, Primary osteoarthritis of both hips, Chronic lumbar radicular pain and numbness (Location of Secondary source of pain) (Bilateral) (R>L), and Diabetic peripheral neuropathy (HCC) were also pertinent to this visit.  Visit Diagnosis: 1. Chronic pain   2. Encounter for therapeutic drug level monitoring   3. Long term current use of opiate analgesic   4. Chronic low back pain   5. Neurogenic pain   6. Neuropathic pain   7. Chronic pain of lower extremity, unspecified laterality   8. Chronic hip pain, unspecified laterality   9. Primary osteoarthritis of both hips   10. Chronic lumbar radicular pain and numbness (Location of Secondary source of pain) (Bilateral) (R>L)   11. Diabetic peripheral neuropathy (HCC)     Problem-specific Plan(s): No problem-specific assessment & plan notes found for this encounter.   Plan of Care  Pharmacotherapy (Medications Ordered): Meds ordered this encounter  Medications  . DISCONTD: gabapentin (NEURONTIN) 600 MG tablet    Sig: Take 1 tablet (600 mg total) by mouth every 8 (eight) hours.     Dispense:  90 tablet    Refill:  2    Do not place this medication, or any other prescription from our practice, on "Automatic Refill". Patient may have prescription filled one day early if pharmacy is closed on scheduled refill date.  . morphine (MS CONTIN) 15 MG 12 hr tablet    Sig: Take 1 tablet (15 mg total) by mouth every 12 (twelve) hours.    Dispense:  60 tablet    Refill:  0    Do not place this medication, or any other prescription from our practice, on "Automatic Refill". Patient may have prescription filled one day early if pharmacy is closed on scheduled refill date. Do not fill until: 10/21/15 To last until: 11/20/15  . morphine (MS CONTIN) 15 MG 12 hr tablet    Sig: Take 1 tablet (15 mg total) by mouth every 12 (twelve) hours.    Dispense:  60 tablet    Refill:  0    Do not place this medication, or any other prescription from our practice, on "Automatic Refill". Patient may have prescription filled one day early if pharmacy is closed on scheduled refill date. Do not fill until: 11/20/15 To last until: 12/20/15  . morphine (MS CONTIN) 15 MG 12 hr tablet    Sig: Take 1 tablet (15 mg total) by mouth every 12 (twelve) hours.  Dispense:  60 tablet    Refill:  0    Do not place this medication, or any other prescription from our practice, on "Automatic Refill". Patient may have prescription filled one day early if pharmacy is closed on scheduled refill date. Do not fill until: 12/20/15 To last until: 01/19/16  . gabapentin (NEURONTIN) 600 MG tablet    Sig: Take 1 tablet (600 mg total) by mouth every 6 (six) hours.    Dispense:  120 tablet    Refill:  2    Do not place this medication, or any other prescription from our practice, on "Automatic Refill". Patient may have prescription filled one day early if pharmacy is closed on scheduled refill date.    Lab-work & Procedure Ordered: Orders Placed This Encounter  Procedures  . MR Lumbar Spine Wo Contrast    Standing  Status: Future     Number of Occurrences:      Standing Expiration Date: 10/20/2016    Scheduling Instructions:     Please provide canal diameter in millimeters when describing any spinal stenosis.    Order Specific Question:  Reason for Exam (SYMPTOM  OR DIAGNOSIS REQUIRED)    Answer:  Lumbar radiculopathy/radiculitis    Order Specific Question:  Preferred imaging location?    Answer:  [facility]    Order Specific Question:  Does the patient have a pacemaker or implanted devices?    Answer:  No    Order Specific Question:  What is the patient's sedation requirement?    Answer:  No Sedation    Order Specific Question:  Call Results- Best Contact Number?    Answer:  [phone] (Pain Clinic facility) (Dr. Dossie Arbour)  . DG HIP UNILAT W OR W/O PELVIS 2-3 VIEWS LEFT    Standing Status: Future     Number of Occurrences:      Standing Expiration Date: 10/20/2016    Order Specific Question:  Reason for Exam (SYMPTOM  OR DIAGNOSIS REQUIRED)    Answer:  Left hip pain/arthralgia    Order Specific Question:  Is the patient pregnant?    Answer:  No    Order Specific Question:  Preferred imaging location?    Answer:  [facility]    Order Specific Question:  Call Results- Best Contact Number?    Answer:  [phone] (Pain Clinic facility) (Dr. Dossie Arbour)  . DG HIP UNILAT W OR W/O PELVIS 2-3 VIEWS RIGHT    Standing Status: Future     Number of Occurrences:      Standing Expiration Date: 10/20/2016    Order Specific Question:  Reason for Exam (SYMPTOM  OR DIAGNOSIS REQUIRED)    Answer:  Right hip pain/arthralgia    Order Specific Question:  Is the patient pregnant?    Answer:  No    Order Specific Question:  Preferred imaging location?    Answer:  [facility]    Order Specific Question:  Call Results- Best Contact Number?    Answer:  [phone] (Pain Clinic facility) (Dr. Dossie Arbour)  . ToxASSURE Select 13 (MW), Urine    Volume: 30 ml(s). Minimum 3 ml of urine is  needed. Document temperature of fresh sample. Indications: Long term (current) use of opiate analgesic (Z79.891)  . Comprehensive metabolic panel    Order Specific Question:  Has the patient fasted?    Answer:  No  . C-reactive protein  . Magnesium  . Sedimentation rate  . Vitamin B12    Indication: Bone Pain (M89.9)  .  Vitamin D pnl(25-hydrxy+1,25-dihy)-bld  . NCV with EMG(electromyography)    Bilateral testing requested.    Standing Status: Future     Number of Occurrences:      Standing Expiration Date: 10/20/2016    Scheduling Instructions:     Please refer this patient to [facility] for Nerve Conduction testing of the lower extremities. (EMG & PNCV)    Order Specific Question:  Where should this test be performed?    Answer:  Other    Imaging Ordered: MR LUMBAR SPINE WO CONTRAST DG HIP UNILAT W OR W/O PELVIS 2-3 VIEWS LEFT DG HIP UNILAT W OR W/O PELVIS 2-3 VIEWS RIGHT  Interventional Therapies: Scheduled: None at this time. PRN Procedures: None at this time.    Referral(s) or Consult(s): Neurology consult for bilateral lower extremity nerve conduction testing (EMG/PNCV). The patient has symptoms compatible with a diabetic peripheral neuropathy as well as bilateral lower lumbar sensory radiculopathies.  Medications administered during this visit: Tammy Acosta had no medications administered during this visit.  Future Appointments Date Time Provider [place]  10/30/2015 2:00 PM Leone Haven, MD [facility] None  01/15/2016 10:40 AM Milinda Pointer, MD [facility] None    Primary Care Physician: Tommi Rumps, MD Location: [facility] Facility Note by: Kathlen Brunswick. Dossie Arbour, M.D, DABA, DABAPM, DABPM, DABIPP, FIPP  Pain Score Disclaimer: We use the NRS-11 scale. This is a self-reported, subjective measurement of pain severity with only modest accuracy. It is used primarily to identify changes within a particular patient. It  must be understood that outpatient pain scales are significantly less accurate that those used for research, where they can be applied under ideal controlled circumstances with minimal exposure to variables. In reality, the score is likely to be a combination of pain intensity and pain affect, where pain affect describes the degree of emotional arousal or changes in action readiness caused by the sensory experience of pain. Factors such as social and work situation, setting, emotional state, anxiety levels, expectation, and prior pain experience may influence pain perception and show large inter-individual differences that may also be affected by time variables.

## 2015-10-21 NOTE — Progress Notes (Signed)
Safety precautions to be maintained throughout the outpatient stay will include: orient to surroundings, keep bed in low position, maintain call bell within reach at all times, provide assistance with transfer out of bed and ambulation. Did not bring pill bottles.  Reminded to bring to all appts. 

## 2015-10-21 NOTE — Patient Instructions (Addendum)
Smoking Cessation, Tips for Success If you are ready to quit smoking, congratulations! You have chosen to help yourself be healthier. Cigarettes bring nicotine, tar, carbon monoxide, and other irritants into your body. Your lungs, heart, and blood vessels will be able to work better without these poisons. There are many different ways to quit smoking. Nicotine gum, nicotine patches, a nicotine inhaler, or nicotine nasal spray can help with physical craving. Hypnosis, support groups, and medicines help break the habit of smoking. WHAT THINGS CAN I DO TO MAKE QUITTING EASIER?  Here are some tips to help you quit for good:  Pick a date when you will quit smoking completely. Tell all of your friends and family about your plan to quit on that date.  Do not try to slowly cut down on the number of cigarettes you are smoking. Pick a quit date and quit smoking completely starting on that day.  Throw away all cigarettes.   Clean and remove all ashtrays from your home, work, and car.  On a card, write down your reasons for quitting. Carry the card with you and read it when you get the urge to smoke.  Cleanse your body of nicotine. Drink enough water and fluids to keep your urine clear or pale yellow. Do this after quitting to flush the nicotine from your body.  Learn to predict your moods. Do not let a bad situation be your excuse to have a cigarette. Some situations in your life might tempt you into wanting a cigarette.  Never have "just one" cigarette. It leads to wanting another and another. Remind yourself of your decision to quit.  Change habits associated with smoking. If you smoked while driving or when feeling stressed, try other activities to replace smoking. Stand up when drinking your coffee. Brush your teeth after eating. Sit in a different chair when you read the paper. Avoid alcohol while trying to quit, and try to drink fewer caffeinated beverages. Alcohol and caffeine may urge you to  smoke.  Avoid foods and drinks that can trigger a desire to smoke, such as sugary or spicy foods and alcohol.  Ask people who smoke not to smoke around you.  Have something planned to do right after eating or having a cup of coffee. For example, plan to take a walk or exercise.  Try a relaxation exercise to calm you down and decrease your stress. Remember, you may be tense and nervous for the first 2 weeks after you quit, but this will pass.  Find new activities to keep your hands busy. Play with a pen, coin, or rubber band. Doodle or draw things on paper.  Brush your teeth right after eating. This will help cut down on the craving for the taste of tobacco after meals. You can also try mouthwash.   Use oral substitutes in place of cigarettes. Try using lemon drops, carrots, cinnamon sticks, or chewing gum. Keep them handy so they are available when you have the urge to smoke.  When you have the urge to smoke, try deep breathing.  Designate your home as a nonsmoking area.  If you are a heavy smoker, ask your health care provider about a prescription for nicotine chewing gum. It can ease your withdrawal from nicotine.  Reward yourself. Set aside the cigarette money you save and buy yourself something nice.  Look for support from others. Join a support group or smoking cessation program. Ask someone at home or at work to help you with your plan   to quit smoking.  Always ask yourself, "Do I need this cigarette or is this just a reflex?" Tell yourself, "Today, I choose not to smoke," or "I do not want to smoke." You are reminding yourself of your decision to quit.  Do not replace cigarette smoking with electronic cigarettes (commonly called e-cigarettes). The safety of e-cigarettes is unknown, and some may contain harmful chemicals.  If you relapse, do not give up! Plan ahead and think about what you will do the next time you get the urge to smoke. HOW WILL I FEEL WHEN I QUIT SMOKING? You  may have symptoms of withdrawal because your body is used to nicotine (the addictive substance in cigarettes). You may crave cigarettes, be irritable, feel very hungry, cough often, get headaches, or have difficulty concentrating. The withdrawal symptoms are only temporary. They are strongest when you first quit but will go away within 10-14 days. When withdrawal symptoms occur, stay in control. Think about your reasons for quitting. Remind yourself that these are signs that your body is healing and getting used to being without cigarettes. Remember that withdrawal symptoms are easier to treat than the major diseases that smoking can cause.  Even after the withdrawal is over, expect periodic urges to smoke. However, these cravings are generally short lived and will go away whether you smoke or not. Do not smoke! WHAT RESOURCES ARE AVAILABLE TO HELP ME QUIT SMOKING? Your health care provider can direct you to community resources or hospitals for support, which may include:  Group support.  Education.  Hypnosis.  Therapy.   This information is not intended to replace advice given to you by your health care provider. Make sure you discuss any questions you have with your health care provider.   Document Released: 05/08/2004 Document Revised: 08/31/2014 Document Reviewed: 01/26/2013 Elsevier Interactive Patient Education 2016 [employer]. Instructed to get lab drawn at pre admit testing today.

## 2015-10-22 ENCOUNTER — Other Ambulatory Visit: Payer: Self-pay | Admitting: Pain Medicine

## 2015-10-22 ENCOUNTER — Encounter: Payer: Self-pay | Admitting: Pain Medicine

## 2015-10-22 MED ORDER — MAGNESIUM OXIDE -MG SUPPLEMENT 500 MG PO CAPS: 1.0000 | ORAL_CAPSULE | Freq: Every day | ORAL | Status: DC

## 2015-10-22 NOTE — Progress Notes (Signed)
Quick Note:  Lab results reviewed and found to be within normal limits. ______ 

## 2015-10-22 NOTE — Progress Notes (Signed)

## 2015-10-22 NOTE — Progress Notes (Signed)
Quick Note:  Normal Magnesium levels are between 1.6 and 2.3 mEq/L. Low magnesium blood level can lead to low calcium and potassium levels. Low levels may indicate inadequate dietary consuming, poor absorbtion, or excessive excretion. Signs and symptoms may include: leg cramps, foot pain, muscle twitches, loss of appetite, nausea, vomiting, fatigue, weakness, numbness, tingling, seizures, personality changes, abnormal heart rhythms, and/or coronary artery spasms.  ______ 

## 2015-10-22 NOTE — Progress Notes (Signed)
Quick Note:   Normal Creatinine levels are between 0.5 and 0.9 mg/dl for our lab. Any condition that impairs the function of the kidneys is likely to raise the creatinine level in the blood. The most common causes of longstanding (chronic) kidney disease in adults are high blood pressure and diabetes. Other causes of elevated blood creatinine levels include drugs, ingestion of a large amount of dietary meat, kidney infections, rhabdomyolysis (abnormal muscle breakdown), and urinary tract obstruction.  Results of a total protein test are usually considered along with those from other tests of the CMP and will give the healthcare practitioner information on a person's general health status with regard to nutrition and/or conditions involving major organs, such as the kidney and liver. A high total protein level may be seen with chronic inflammation or infections such as viral hepatitis or HIV. It also may be associated with bone marrow disorders such as multiple myeloma. Possible causes of high blood protein include: Bone marrow disorder Multiple myeloma Amyloidosis Monoclonal gammopathy of undetermined significance (MGUS) Chronic inflammatory conditions HIV/AIDS Dehydration (which may make blood proteins appear falsely elevated)  A high-protein diet doesn't cause high blood protein.  High blood protein is not a specific disease or condition in itself. It's usually a laboratory finding uncovered during the evaluation of a particular condition or symptom. For instance, although high blood protein is found in people who are dehydrated, the real problem is that the blood plasma is actually more concentrated.  Certain proteins in the blood may be elevated as your body fights an infection or some other inflammation. People with certain bone marrow diseases, such as multiple myeloma, may have high blood protein levels before they show any other symptoms. eGFR (Estimated Glomerular Filtration Rate)  results are reported as milliliters/minute/1.60m (mL/min/1.719m. Because some laboratories do not collect information on a patient's race when the sample is collected for testing, they may report calculated results for both African Americans and non-African Americans.  The [facility] only reporting actual results once values are < 60 mL/min. 1. Normal values: 90-120 mL/min 2. Below 60 mL/min suggests that some kidney damage has occurred. 3. Between 5968nd 30 indicate (Moderate) Stage 3 kidney disease. 4. Between 29 and 15 represent (Severe) Stage 4 kidney disease. 5. Less than 15 is considered (Kidney Failure) Stage 5. ______

## 2015-10-26 LAB — TOXASSURE SELECT 13 (MW), URINE: PDF: 0

## 2015-10-30 ENCOUNTER — Ambulatory Visit (INDEPENDENT_AMBULATORY_CARE_PROVIDER_SITE_OTHER): Payer: Medicare Other | Admitting: Family Medicine

## 2015-10-30 ENCOUNTER — Ambulatory Visit: Payer: Medicare Other | Admitting: Family Medicine

## 2015-10-30 ENCOUNTER — Encounter: Payer: Self-pay | Admitting: Family Medicine

## 2015-10-30 VITALS — BP 144/96 | HR 85 | Temp 98.7°F | Ht 62.0 in | Wt 165.4 lb

## 2015-10-30 DIAGNOSIS — Z23 Encounter for immunization: Secondary | ICD-10-CM

## 2015-10-30 DIAGNOSIS — Z86010 Personal history of colonic polyps: Secondary | ICD-10-CM

## 2015-10-30 DIAGNOSIS — Z720 Tobacco use: Secondary | ICD-10-CM

## 2015-10-30 DIAGNOSIS — F32A Depression, unspecified: Secondary | ICD-10-CM

## 2015-10-30 DIAGNOSIS — F17210 Nicotine dependence, cigarettes, uncomplicated: Secondary | ICD-10-CM | POA: Insufficient documentation

## 2015-10-30 DIAGNOSIS — E119 Type 2 diabetes mellitus without complications: Secondary | ICD-10-CM

## 2015-10-30 DIAGNOSIS — I10 Essential (primary) hypertension: Secondary | ICD-10-CM

## 2015-10-30 DIAGNOSIS — F329 Major depressive disorder, single episode, unspecified: Secondary | ICD-10-CM

## 2015-10-30 LAB — COMPREHENSIVE METABOLIC PANEL
ALBUMIN: 4.4 g/dL (ref 3.5–5.2)
ALT: 13 U/L (ref 0–35)
AST: 15 U/L (ref 0–37)
Alkaline Phosphatase: 59 U/L (ref 39–117)
BUN: 20 mg/dL (ref 6–23)
CHLORIDE: 101 meq/L (ref 96–112)
CO2: 27 mEq/L (ref 19–32)
CREATININE: 0.96 mg/dL (ref 0.40–1.20)
Calcium: 9.8 mg/dL (ref 8.4–10.5)
GFR: 62.87 mL/min (ref >60.00)
Glucose, Bld: 77 mg/dL (ref 70–99)
Potassium: 4.1 mEq/L (ref 3.5–5.1)
SODIUM: 139 meq/L (ref 135–145)
TOTAL PROTEIN: 7.6 g/dL (ref 6.0–8.3)
Total Bilirubin: 0.3 mg/dL (ref 0.2–1.2)

## 2015-10-30 MED ORDER — NICOTINE 14 MG/24HR TD PT24: 14.0000 mg | MEDICATED_PATCH | Freq: Every day | TRANSDERMAL | Status: DC

## 2015-10-30 MED ORDER — HYDROCHLOROTHIAZIDE 25 MG PO TABS: 25.0000 mg | ORAL_TABLET | Freq: Every day | ORAL | Status: DC

## 2015-10-30 MED ORDER — VENLAFAXINE HCL 25 MG PO TABS: 25.0000 mg | ORAL_TABLET | Freq: Every day | ORAL | Status: DC

## 2015-10-30 NOTE — Assessment & Plan Note (Signed)
Sugars appear to be much better controlled per her report. She did have some hypoglycemia with the glipizide, though this resolved quickly with eating. She's not had any recurrence of this since she started to take medication with lunch. She'll continue to monitor her sugars. She was advised if they decrease below 80 or increase above 200 persistently she is to let us know. She will continue her current metformin and glipizide doses. She will work on diet and exercise as well.

## 2015-10-30 NOTE — Assessment & Plan Note (Signed)
Improved. No SI or HI. She is ready to continue to taper off Effexor. We will change her to 25 mg of Effexor daily for 2 weeks and then she will take 25 mg of Effexor every other day for 2 weeks and then discontinue the medication. She is given withdrawal symptoms. She'll follow-up with me in one month. Given return precautions.

## 2015-10-30 NOTE — Patient Instructions (Signed)
Nice to see you. We are going to increase her hydrochlorothiazide for your blood pressure. Please continue to monitor her blood pressure at home. Her goal is less than 130/90. Please continue current diabetic regimen. Please monitor your blood sugars. If they drop below 80 on a consistent basis were greater than 200 on a consistent basis please let us know. We are going to continue to taper you off the Effexor. You will take Effexor 25 mg daily for 2 weeks and then take 25 mg every other day for 2 weeks and then stop it. if you develop nausea, vomiting, diarrhea, headaches, lightheadedness, dizziness, diminished appetite, sweating, chills, tremors, paresthesias, fatigue, somnolence, and sleep disturbances, agitation, anxiety, akathisia, panic attacks, irritability, aggressiveness, worsening of mood, dysphoria, mood lability, hyperactivity, mania/hypomania, depersonalization, decreased concentration, slowed thinking, confusion, and memory or concentration difficulties please let us know. If you develop thoughts of harming your self or others please seek medical attention immediately.

## 2015-10-30 NOTE — Progress Notes (Signed)
Patient ID: Tammy Acosta, female   DOB: 10-18-1955, 60 y.o.   MRN: [id number]  Tammy Rumps, MD Phone: [phone]  Tammy Acosta is a 60 y.o. female who presents today for follow-up.  HYPERTENSION Disease Monitoring: Blood pressure range-107-178/60-1 08 Chest pain, palpitations- no      Dyspnea- no Medications: Compliance- taking clonidine, HCTZ, lisinopril Lightheadedness,Syncope- no   Edema- no  DIABETES Disease Monitoring: Blood Sugar ranges-is checking and reports they are in a normal range, she does report that initially after starting the glipizide and was taking it in the morning she would have sugars dropping into the 60s and she had some shakiness and sweatiness with this, she was not eating breakfast with this medication. She moved the medication to lunch and takes it with food and does not have any hyperglycemic issues. Polyuria/phagia/dipsia- no      Visual problems- no, saw ophthalmology on 09/23/15 Medications: Compliance- taking glipizide and metformin Hypoglycemic symptoms- yes, though none in the last 3-4 weeks. Resolved with eating food.  Depression: Much better. She is staying busy with taking care of her grandchildren. She denies SI and HI. She now she is ready to continue to come off the Effexor.   PMH: Smoker, patient reports she would like help in quitting. Notes she would like to try nicotine patches.   ROS see history of present illness  Objective  Physical Exam Filed Vitals:   10/30/15 1054  BP: 144/96  Pulse: 85  Temp: 98.7 F (37.1 C)    BP Readings from Last 3 Encounters:  10/30/15 144/96  09/30/15 128/86  09/04/15 180/100   Wt Readings from Last 3 Encounters:  10/30/15 165 lb 6.4 oz (75.025 kg)  10/21/15 158 lb (71.668 kg)  09/30/15 162 lb 3.2 oz (73.573 kg)    Physical Exam  Constitutional: She is well-developed, well-nourished, and in no distress.  HENT:  Head: Normocephalic and atraumatic.  Right Ear: External ear  normal.  Left Ear: External ear normal.  Mouth/Throat: Oropharynx is clear and moist. No oropharyngeal exudate.  Eyes: Pupils are equal, round, and reactive to light.  Cardiovascular: Normal rate, regular rhythm and normal heart sounds.   Pulmonary/Chest: Effort normal and breath sounds normal.  Neurological: She is alert. Gait normal.  Skin: Skin is warm and dry. She is not diaphoretic.  Psychiatric: Mood and affect normal.     Assessment/Plan: Please see individual problem list.  Type 2 diabetes mellitus ([name]) Sugars appear to be much better controlled per her report. She did have some hypoglycemia with the glipizide, though this resolved quickly with eating. She's not had any recurrence of this since she started to take medication with lunch. She'll continue to monitor her sugars. She was advised if they decrease below 80 or increase above 200 persistently she is to let us know. She will continue her current metformin and glipizide doses. She will work on diet and exercise as well.  Clinical depression Improved. No SI or HI. She is ready to continue to taper off Effexor. We will change her to 25 mg of Effexor daily for 2 weeks and then she will take 25 mg of Effexor every other day for 2 weeks and then discontinue the medication. She is given withdrawal symptoms. She'll follow-up with me in one month. Given return precautions.  Essential (primary) hypertension Not at goal. We will increase her HCTZ to 25 mg daily. We will check a CMP today. She will continue to monitor blood pressure at home. She is  advised of her goal of less than 130/90. If persistently above this with checking at home she will call our office. Given return precautions.  Tobacco abuse Discuss smoking cessation. We'll treat with nicotine patches. She was advised to pick quit date and on that date start the nicotine patches.    Orders Placed This Encounter  Procedures  . Pneumococcal polysaccharide vaccine 23-valent  greater than or equal to 2yo subcutaneous/IM  . Flu Vaccine QUAD 36+ mos IM  . Comp Met (CMET)  . Ambulatory referral to Gastroenterology    Referral Priority:  Routine    Referral Type:  Consultation    Referral Reason:  Specialty Services Required    Number of Visits Requested:  1    Meds ordered this encounter  Medications  . hydrochlorothiazide (HYDRODIURIL) 25 MG tablet    Sig: Take 1 tablet (25 mg total) by mouth daily.    Dispense:  90 tablet    Refill:  3  . venlafaxine (EFFEXOR) 25 MG tablet    Sig: Take 1 tablet (25 mg total) by mouth daily with breakfast.    Dispense:  30 tablet    Refill:  1  . nicotine (NICODERM CQ - DOSED IN MG/24 HOURS) 14 mg/24hr patch    Sig: Place 1 patch (14 mg total) onto the skin daily.    Dispense:  28 patch    Refill:  0    # Healthcare maintenance: Pneumovax and flu vaccine given. Referral placed for colonoscopy.   Tammy Rumps, MD [place]

## 2015-10-30 NOTE — Assessment & Plan Note (Signed)
Not at goal. We will increase her HCTZ to 25 mg daily. We will check a CMP today. She will continue to monitor blood pressure at home. She is advised of her goal of less than 130/90. If persistently above this with checking at home she will call our office. Given return precautions.

## 2015-10-30 NOTE — Progress Notes (Signed)
Pre visit review using our clinic review tool, if applicable. No additional management support is needed unless otherwise documented below in the visit note. 

## 2015-10-30 NOTE — Assessment & Plan Note (Signed)
Discuss smoking cessation. We'll treat with nicotine patches. She was advised to pick quit date and on that date start the nicotine patches.

## 2015-10-31 ENCOUNTER — Encounter: Payer: Self-pay | Admitting: Family Medicine

## 2015-11-04 ENCOUNTER — Telehealth: Payer: Self-pay | Admitting: Gastroenterology

## 2015-11-04 NOTE — Telephone Encounter (Signed)
Patient returned your phone call regarding colonoscopy °

## 2015-11-07 NOTE — Telephone Encounter (Signed)
Gastroenterology Pre-Procedure Review  Request Date:  Requesting Physician: Dr.   PATIENT REVIEW QUESTIONS: The patient responded to the following health history questions as indicated:    1. Are you having any GI issues? no 2. Do you have a personal history of Polyps? yes (repeat 5 years) 3. Do you have a family history of Colon Cancer or Polyps? yes (father, colon cancer) 4. Diabetes Mellitus? yes (Type 2) 5. Joint replacements in the past 12 months?no 6. Major health problems in the past 3 months?no 7. Any artificial heart valves, MVP, or defibrillator?no    MEDICATIONS & ALLERGIES:    Patient reports the following regarding taking any anticoagulation/antiplatelet therapy:   Plavix, Coumadin, Eliquis, Xarelto, Lovenox, Pradaxa, Brilinta, or Effient? no Aspirin? no  Patient confirms/reports the following medications:  Current Outpatient Prescriptions  Medication Sig Dispense Refill  . acetaminophen (RA ACETAMINOPHEN) 650 MG CR tablet Take 1,300 mg by mouth daily.     . blood glucose meter kit and supplies Dispense One touch meter.  E11.9 1 each 0  . Blood Glucose Monitoring Suppl (ONE TOUCH ULTRA 2) w/Device KIT     . cloNIDine (CATAPRES) 0.3 MG tablet Take 0.3 mg by mouth 2 (two) times daily.    Marland Kitchen gabapentin (NEURONTIN) 600 MG tablet Take 1 tablet (600 mg total) by mouth every 6 (six) hours. 120 tablet 2  . glipiZIDE (GLUCOTROL) 5 MG tablet Take 1 tablet (5 mg total) by mouth daily before breakfast. 30 tablet 3  . hydrochlorothiazide (HYDRODIURIL) 25 MG tablet Take 1 tablet (25 mg total) by mouth daily. 90 tablet 3  . lisinopril (PRINIVIL,ZESTRIL) 20 MG tablet Take 20 mg by mouth daily.    . Magnesium Oxide 500 MG CAPS Take 1 capsule (500 mg total) by mouth daily. 100 capsule PRN  . metFORMIN (GLUCOPHAGE) 1000 MG tablet Take 1,000 mg by mouth 2 (two) times daily with a meal.    . morphine (MS CONTIN) 15 MG 12 hr tablet Take 1 tablet (15 mg total) by mouth every 12 (twelve) hours.  60 tablet 0  . morphine (MS CONTIN) 15 MG 12 hr tablet Take 1 tablet (15 mg total) by mouth every 12 (twelve) hours. 60 tablet 0  . morphine (MS CONTIN) 15 MG 12 hr tablet Take 1 tablet (15 mg total) by mouth every 12 (twelve) hours. 60 tablet 0  . nicotine (NICODERM CQ - DOSED IN MG/24 HOURS) 14 mg/24hr patch Place 1 patch (14 mg total) onto the skin daily. 28 patch 0  . simvastatin (ZOCOR) 40 MG tablet Take 40 mg by mouth every evening.    Marland Kitchen tiZANidine (ZANAFLEX) 4 MG tablet Take 4 mg by mouth 3 (three) times daily.    Marland Kitchen venlafaxine (EFFEXOR) 25 MG tablet Take 1 tablet (25 mg total) by mouth daily with breakfast. 30 tablet 1   No current facility-administered medications for this visit.    Patient confirms/reports the following allergies:  Allergies  Allergen Reactions  . Penicillins Anaphylaxis and Hives  . Tetracyclines & Related Rash    No orders of the defined types were placed in this encounter.    AUTHORIZATION INFORMATION Primary Insurance: 1D#: Group #:  Secondary Insurance: 1D#: Group #:  SCHEDULE INFORMATION: Date:  Time: Location: Didn't schedule. Pt doesn't want any place in [facility]. Will call PCP

## 2015-11-07 NOTE — Telephone Encounter (Signed)
Pt stated she didn't want to go anywhere in [facility] [place] [place]. Advised her Dr. Servando SnareWohl does go to [facility] in [place], but pt still wanted to speak with PCP. Advised her to call back if she decides to get.

## 2015-11-08 ENCOUNTER — Telehealth: Payer: Self-pay | Admitting: *Deleted

## 2015-11-08 NOTE — Telephone Encounter (Deleted)
Patient has a referral for a colonostomy, she preferred not to be seen in [place], to have the procedure done. She stated that she has a call from Dr. [name] CoveWahl's office, she requested to be seen in [place].  Please advise  Pt contact

## 2015-11-22 ENCOUNTER — Telehealth: Payer: Self-pay

## 2015-11-22 NOTE — Telephone Encounter (Signed)
Called pt to discuss open MRI. Pt stated this is not a good time to schedule MRI because she is moving. Pt also stated she would call back when it is a good time for her to schedule MRI

## 2015-11-26 ENCOUNTER — Other Ambulatory Visit: Payer: Self-pay | Admitting: Family Medicine

## 2015-11-28 ENCOUNTER — Other Ambulatory Visit: Payer: Self-pay | Admitting: Family Medicine

## 2015-12-03 ENCOUNTER — Encounter: Payer: Self-pay | Admitting: Family Medicine

## 2015-12-03 ENCOUNTER — Ambulatory Visit (INDEPENDENT_AMBULATORY_CARE_PROVIDER_SITE_OTHER): Payer: Medicare Other | Admitting: Family Medicine

## 2015-12-03 VITALS — BP 136/94 | HR 83 | Temp 98.3°F | Ht 62.0 in | Wt 166.6 lb

## 2015-12-03 DIAGNOSIS — Z720 Tobacco use: Secondary | ICD-10-CM

## 2015-12-03 DIAGNOSIS — F329 Major depressive disorder, single episode, unspecified: Secondary | ICD-10-CM | POA: Diagnosis not present

## 2015-12-03 DIAGNOSIS — J069 Acute upper respiratory infection, unspecified: Secondary | ICD-10-CM

## 2015-12-03 DIAGNOSIS — I10 Essential (primary) hypertension: Secondary | ICD-10-CM

## 2015-12-03 DIAGNOSIS — E1142 Type 2 diabetes mellitus with diabetic polyneuropathy: Secondary | ICD-10-CM | POA: Diagnosis not present

## 2015-12-03 DIAGNOSIS — F32A Depression, unspecified: Secondary | ICD-10-CM

## 2015-12-03 HISTORY — DX: Acute upper respiratory infection, unspecified: J06.9

## 2015-12-03 LAB — BASIC METABOLIC PANEL WITH GFR
BUN: 21 mg/dL (ref 6–23)
CO2: 29 meq/L (ref 19–32)
Calcium: 9.8 mg/dL (ref 8.4–10.5)
Chloride: 101 meq/L (ref 96–112)
Creatinine, Ser: 1.04 mg/dL (ref 0.40–1.20)
GFR: 57.31 mL/min — ABNORMAL LOW
Glucose, Bld: 114 mg/dL — ABNORMAL HIGH (ref 70–99)
Potassium: 4.1 meq/L (ref 3.5–5.1)
Sodium: 141 meq/L (ref 135–145)

## 2015-12-03 MED ORDER — BENZONATATE 200 MG PO CAPS: 200.0000 mg | ORAL_CAPSULE | Freq: Two times a day (BID) | ORAL | Status: DC | PRN

## 2015-12-03 MED ORDER — LISINOPRIL 40 MG PO TABS: 40.0000 mg | ORAL_TABLET | Freq: Every day | ORAL | Status: DC

## 2015-12-03 NOTE — Assessment & Plan Note (Signed)
Blood pressure continues to not be a goal. Recheck of her blood pressure is actually higher. We will increase her lisinopril to 40 mg daily. She will take two 20 mg tablets until her current prescription is out and then fill the new prescription. We'll check a BMP today and a BMP in 1 week. Continue to monitor blood pressures at home.

## 2015-12-03 NOTE — Assessment & Plan Note (Signed)
Improved. No SI or HI. She is tapered off the Effexor. I offered additional antidepressant medication though she declined this. PHQ 9 was reassuring with a score of 4. She'll continue to monitor. If worsen she will let us know.

## 2015-12-03 NOTE — Progress Notes (Signed)
Pre visit review using our clinic review tool, if applicable. No additional management support is needed unless otherwise documented below in the visit note. 

## 2015-12-03 NOTE — Progress Notes (Signed)
Patient ID: Tammy HumDeborah H Acosta, female   DOB: October 30, 1955, 60 y.o.   MRN: [id number]  Marikay AlarEric Arelyn Gauer, MD Phone: [phone]  Tammy Acosta is a 60 y.o. female who presents today for f/u.  HYPERTENSION Disease Monitoring Home BP Monitoring 130's/70's Chest pain- no    Dyspnea- no Medications Compliance-  Taking clonidine, lisinopril, HCTZ.  Edema- no  DIABETES Disease Monitoring: Blood Sugar ranges-80-low 200's, notes her sugars have been slightly higher with her current viral illness prior to the illness they had been in the low 100s Polyuria/phagia/dipsia- some polyuria       Medications: Compliance- taking glipizide and metformin Hypoglycemic symptoms- no  URI: Patient notes symptoms onset Sunday. Sinus pressure. Yellowish mucus out of her nose. Postnasal drip. Cough productive of greenish mucus. No fevers. Does note some ear aches. Has not taken any medicines for this.  Depression: Patient notes she is off of her Effexor. Came off of it this past weekend. She's been a little irritable coming off of it and notes not sleeping well though overall is doing well. Notes little depressive symptoms. No SI or HI. She does not want to start on any new medications for this at this time.  PMH: smoker   ROS see history of present illness  Objective  Physical Exam Filed Vitals:   12/03/15 1053  BP: 136/94  Pulse: 83  Temp: 98.3 F (36.8 C)  Recheck her blood pressure revealed 152/96  BP Readings from Last 3 Encounters:  12/03/15 136/94  10/30/15 144/96  09/30/15 128/86   Wt Readings from Last 3 Encounters:  12/03/15 166 lb 9.6 oz (75.569 kg)  10/30/15 165 lb 6.4 oz (75.025 kg)  10/21/15 158 lb (71.668 kg)    Physical Exam  Constitutional: She is well-developed, well-nourished, and in no distress.  HENT:  Head: Normocephalic and atraumatic.  Right Ear: External ear normal.  Left Ear: External ear normal.  Mouth/Throat: Oropharynx is clear and moist. No oropharyngeal  exudate.  Normal TMs  Eyes: Conjunctivae are normal. Pupils are equal, round, and reactive to light.  Neck: Neck supple.  Cardiovascular: Normal rate, regular rhythm and normal heart sounds.   Pulmonary/Chest: Effort normal and breath sounds normal.  Lymphadenopathy:    She has no cervical adenopathy.  Neurological: She is alert. Gait normal.  Skin: Skin is warm and dry. She is not diaphoretic.  Psychiatric: Mood and affect normal.     Assessment/Plan: Please see individual problem list.  Clinical depression Improved. No SI or HI. She is tapered off the Effexor. I offered additional antidepressant medication though she declined this. PHQ 9 was reassuring with a score of 4. She'll continue to monitor. If worsen she will let us know.  Essential (primary) hypertension Blood pressure continues to not be a goal. Recheck of her blood pressure is actually higher. We will increase her lisinopril to 40 mg daily. She will take two 20 mg tablets until her current prescription is out and then fill the new prescription. We'll check a BMP today and a BMP in 1 week. Continue to monitor blood pressures at home.  Type 2 diabetes mellitus (HCC) Patient's home blood sugar readings appear to be mostly well controlled with exception last several days with her current upper respiratory illness. She will continue to monitor them. If they remain in the 200s despite her improving illness we will need to consider altering her medication regimen. Will continue her current medication regimen.  Viral upper respiratory illness Patient's symptoms most consistent with viral  upper respiratory illness. Duration makes bacterial illness unlikely. Benign exam overall. Discussed supportive care. Claritin and Flonase for symptom management. Tessalon for cough. Patient will continue to monitor. Given return precautions.  Tobacco abuse Discussed smoking cessation. She is unsure what she wants to try. Previously sent in nicotine  patches. She wants to consider Wellbutrin and whether or not her insurance company will cover this. She will give them a call to discuss.    Orders Placed This Encounter  Procedures  . Basic Metabolic Panel (BMET)  . Basic Metabolic Panel (BMET)    Standing Status: Future     Number of Occurrences:      Standing Expiration Date: 12/02/2016    Meds ordered this encounter  Medications  . lisinopril (PRINIVIL,ZESTRIL) 40 MG tablet    Sig: Take 1 tablet (40 mg total) by mouth daily.    Dispense:  90 tablet    Refill:  3  . benzonatate (TESSALON) 200 MG capsule    Sig: Take 1 capsule (200 mg total) by mouth 2 (two) times daily as needed for cough.    Dispense:  20 capsule    Refill:  0     [name], MD [facility]

## 2015-12-03 NOTE — Assessment & Plan Note (Signed)
Patient's home blood sugar readings appear to be mostly well controlled with exception last several days with her current upper respiratory illness. She will continue to monitor them. If they remain in the 200s despite her improving illness we will need to consider altering her medication regimen. Will continue her current medication regimen.

## 2015-12-03 NOTE — Patient Instructions (Signed)
Nice to see you. Your sinus symptoms are likely related to a virus. You should take over-the-counter Flonase and Claritin. We will send an Tessalon for your cough. Please continue to monitor her symptoms. Please continue to monitor your blood pressure at home. Please continue to monitor your blood sugars. If they continue to creep up or become greater than 300 please let us know. If you develop chest pain, shortness of breath, cough productive of blood, fevers, blood sugars greater than 300, thoughts of harming herself or others, or near change in symptoms please seek medical attention.

## 2015-12-03 NOTE — Assessment & Plan Note (Signed)
Discussed smoking cessation. She is unsure what she wants to try. Previously sent in nicotine patches. She wants to consider Wellbutrin and whether or not her insurance company will cover this. She will give them a call to discuss.

## 2015-12-03 NOTE — Assessment & Plan Note (Addendum)
Patient's symptoms most consistent with viral upper respiratory illness. Duration makes bacterial illness unlikely. Benign exam overall. Discussed supportive care. Claritin and Flonase for symptom management. Tessalon for cough. Patient will continue to monitor. Given return precautions.

## 2015-12-10 ENCOUNTER — Other Ambulatory Visit: Payer: Medicare Other

## 2015-12-11 ENCOUNTER — Encounter: Payer: Self-pay | Admitting: Family Medicine

## 2015-12-27 ENCOUNTER — Other Ambulatory Visit (INDEPENDENT_AMBULATORY_CARE_PROVIDER_SITE_OTHER): Payer: Medicare Other

## 2015-12-27 DIAGNOSIS — I10 Essential (primary) hypertension: Secondary | ICD-10-CM | POA: Diagnosis not present

## 2015-12-27 LAB — BASIC METABOLIC PANEL
BUN: 24 mg/dL — ABNORMAL HIGH (ref 6–23)
CO2: 24 mEq/L (ref 19–32)
Calcium: 9.7 mg/dL (ref 8.4–10.5)
Chloride: 102 mEq/L (ref 96–112)
Creatinine, Ser: 1.09 mg/dL (ref 0.40–1.20)
GFR: 54.27 mL/min — ABNORMAL LOW (ref >60.00)
Glucose, Bld: 140 mg/dL — ABNORMAL HIGH (ref 70–99)
POTASSIUM: 4.1 meq/L (ref 3.5–5.1)
SODIUM: 138 meq/L (ref 135–145)

## 2016-01-02 ENCOUNTER — Encounter: Payer: Self-pay | Admitting: Family Medicine

## 2016-01-02 ENCOUNTER — Ambulatory Visit (INDEPENDENT_AMBULATORY_CARE_PROVIDER_SITE_OTHER): Payer: Medicare Other | Admitting: Family Medicine

## 2016-01-02 VITALS — BP 154/98 | HR 99 | Temp 98.6°F | Ht 62.0 in | Wt 163.4 lb

## 2016-01-02 DIAGNOSIS — I10 Essential (primary) hypertension: Secondary | ICD-10-CM | POA: Diagnosis not present

## 2016-01-02 DIAGNOSIS — R079 Chest pain, unspecified: Secondary | ICD-10-CM | POA: Diagnosis not present

## 2016-01-02 DIAGNOSIS — F329 Major depressive disorder, single episode, unspecified: Secondary | ICD-10-CM

## 2016-01-02 DIAGNOSIS — F32A Depression, unspecified: Secondary | ICD-10-CM

## 2016-01-02 LAB — COMPREHENSIVE METABOLIC PANEL
ALBUMIN: 4.3 g/dL (ref 3.5–5.2)
ALK PHOS: 51 U/L (ref 39–117)
ALT: 11 U/L (ref 0–35)
AST: 14 U/L (ref 0–37)
BILIRUBIN TOTAL: 0.3 mg/dL (ref 0.2–1.2)
BUN: 22 mg/dL (ref 6–23)
CALCIUM: 9.2 mg/dL (ref 8.4–10.5)
CO2: 26 mEq/L (ref 19–32)
CREATININE: 1.13 mg/dL (ref 0.40–1.20)
Chloride: 101 mEq/L (ref 96–112)
GFR: 52.06 mL/min — AB (ref >60.00)
Glucose, Bld: 135 mg/dL — ABNORMAL HIGH (ref 70–99)
Potassium: 3.9 mEq/L (ref 3.5–5.1)
Sodium: 138 mEq/L (ref 135–145)
TOTAL PROTEIN: 7.5 g/dL (ref 6.0–8.3)

## 2016-01-02 LAB — TROPONIN I: TNIDX: 0 ug/l (ref 0.00–0.06)

## 2016-01-02 MED ORDER — SIMVASTATIN 40 MG PO TABS: 40.0000 mg | ORAL_TABLET | Freq: Every evening | ORAL | Status: DC

## 2016-01-02 MED ORDER — SERTRALINE HCL 50 MG PO TABS: ORAL_TABLET | ORAL | Status: DC

## 2016-01-02 NOTE — Assessment & Plan Note (Signed)
Patient mostly atypical components of her chest pain. Sharp discomfort is atypical. Centralized and associated with some shortness of breath is typical the lack of radiation and diaphoresis is atypical. No exertional component. She does have risk factors for cardiac cause. EKG with nonspecific ST depressions that do not appear to quite meet the 1 mm box requirement. No risk factors for PE. Vital signs are stable making PE unlikely. Lung exam normal making that an unlikely cause. Potentially could be related to her anxiety given that this typically occurs when she feels anxious and gets herself worked up. I discussed at length evaluation in the emergency room for this given her episode of chest discomfort in the office. She declined this. I discussed the risks of not being evaluated in the ED for this and she accepted them and signed AMA paperwork. We will obtain a troponin in the office to evaluate this further. I discussed with patient that if she were to develop recurrent chest pain or persistent chest pain she would need to seek medical attention immediately. She is given return precautions. We will additionally refer to cardiology.

## 2016-01-02 NOTE — Progress Notes (Signed)
Patient ID: Tammy Acosta, female   DOB: 20-Apr-1956, 60 y.o.   MRN: [id number]  Tommi Rumps, MD Phone: [phone]  Tammy Acosta is a 60 y.o. female who presents today for follow-up.  HYPERTENSION Disease Monitoring Home BP Monitoring checking, ranges all over the place, 108-180/60-100 Chest pain- yes, see below    Dyspnea- yes, see below Medications Compliance-  Taking clonidine, lisinopril, HCTZ.  Edema- no Patient also notes some headaches with her blood pressures. No vision changes or weakness with this. She has chronic bilateral foot numbness and burning related to neuropathy.  Patient notes intermittent centralized chest discomfort that she describes as sharp. This typically occurs when she becomes anxious. Eases off when she calls herself down. Not exertional. She also notes some shortness of breath with this. No diaphoresis. No radiation of the chest discomfort. She had an episode of chest discomfort in the office. She does note some orthopnea as well. No PND. No history of blood clot. No unilateral leg swelling or bilateral leg swelling. No recent trips or surgeries. No hemoptysis. She does note a family history in her father of stent placement in his 62s.  Depression: Patient notes this is significantly worse than the last time we saw each other. She notes lots of stress recently with not getting along with her daughter. Notes her body typically does not handle stress well. She is planning on seeing a counselor. She would like to start back on an antidepressant.Marland Kitchen   PMH: Smoker   ROS see history of present illness  Objective  Physical Exam Filed Vitals:   01/02/16 1055 01/02/16 1126  BP: 174/100 154/98  Pulse: 99   Temp: 98.6 F (37 C)     BP Readings from Last 3 Encounters:  01/02/16 154/98  12/03/15 136/94  10/30/15 144/96   Wt Readings from Last 3 Encounters:  01/02/16 163 lb 6.4 oz (74.118 kg)  12/03/15 166 lb 9.6 oz (75.569 kg)  10/30/15 165 lb 6.4  oz (75.025 kg)    Physical Exam  Constitutional: No distress.  Cardiovascular: Normal rate, regular rhythm and normal heart sounds.   Pulmonary/Chest: Effort normal and breath sounds normal.  Musculoskeletal: She exhibits no edema.  Neurological: She is alert. Gait normal.  Skin: Skin is warm and dry. She is not diaphoretic.  Psychiatric:  Mood depressed, affect depressed and tearful   EKG: Sinus rhythm, rate 100, nonspecific ST depression in V4 and lead to that appears to be right at 1 mm or slightly less in depression  Assessment/Plan: Please see individual problem list.  Clinical depression Significant only worsened from her last visit. No SI. We will start on Zoloft. She will seek counseling. Suspect her depression and anxiety are likely the cause of her chest discomfort, though see chest pain for further discussion.  Chest pain Patient mostly atypical components of her chest pain. Sharp discomfort is atypical. Centralized and associated with some shortness of breath is typical the lack of radiation and diaphoresis is atypical. No exertional component. She does have risk factors for cardiac cause. EKG with nonspecific ST depressions that do not appear to quite meet the 1 mm box requirement. No risk factors for PE. Vital signs are stable making PE unlikely. Lung exam normal making that an unlikely cause. Potentially could be related to her anxiety given that this typically occurs when she feels anxious and gets herself worked up. I discussed at length evaluation in the emergency room for this given her episode of chest discomfort in  the office. She declined this. I discussed the risks of not being evaluated in the ED for this and she accepted them and signed [place] paperwork. We will obtain a troponin in the office to evaluate this further. I discussed with patient that if she were to develop recurrent chest pain or persistent chest pain she would need to seek medical attention immediately. She  is given return precautions. We will additionally refer to cardiology.  Essential (primary) hypertension Elevated today. Appears to be all over the place at home. We'll continue her current medications. Check and lab work. If normal lab work normal troponin we'll likely add amlodipine to her regimen. She is given return precautions.    Orders Placed This Encounter  Procedures  . Troponin I  . Comp Met (CMET)  . Ambulatory referral to Cardiology    Referral Priority:  Routine    Referral Type:  Consultation    Referral Reason:  Specialty Services Required    Requested Specialty:  Cardiology    Number of Visits Requested:  1  . EKG 12-Lead    Meds ordered this encounter  Medications  . sertraline (ZOLOFT) 50 MG tablet    Sig: Please take 25 mg (1/2 tablet) by mouth daily for 7 days, then 50 mg (1 tablet) by mouth daily    Dispense:  30 tablet    Refill:  3  . simvastatin (ZOCOR) 40 MG tablet    Sig: Take 1 tablet (40 mg total) by mouth every evening.    Dispense:  90 tablet    Refill:  1    Tommi Rumps, MD [place]

## 2016-01-02 NOTE — Assessment & Plan Note (Signed)
Elevated today. Appears to be all over the place at home. We'll continue her current medications. Check and lab work. If normal lab work normal troponin we'll likely add amlodipine to her regimen. She is given return precautions.

## 2016-01-02 NOTE — Assessment & Plan Note (Addendum)
Significant only worsened from her last visit. No SI. We will start on Zoloft. She will seek counseling. Suspect her depression and anxiety are likely the cause of her chest discomfort, though see chest pain for further discussion.

## 2016-01-02 NOTE — Patient Instructions (Signed)
Nice to see you. I have advised she go to the emergency room for evaluation of your chest pain that you have declined this. We will order lab work to evaluate further. If you develop persistent chest pain or worsening chest pain or shortness of breath or sweating or palpitations or numbness or weakness or worsening depression or thoughts of harming herself or others please seek medical attention immediately.

## 2016-01-02 NOTE — Progress Notes (Signed)
Pre visit review using our clinic review tool, if applicable. No additional management support is needed unless otherwise documented below in the visit note. 

## 2016-01-09 ENCOUNTER — Ambulatory Visit: Payer: Medicare Other | Admitting: Family Medicine

## 2016-01-10 ENCOUNTER — Encounter: Payer: Self-pay | Admitting: Family Medicine

## 2016-01-10 ENCOUNTER — Ambulatory Visit (INDEPENDENT_AMBULATORY_CARE_PROVIDER_SITE_OTHER): Payer: Medicare Other | Admitting: Family Medicine

## 2016-01-10 VITALS — BP 114/78 | HR 91 | Temp 98.1°F | Ht 62.0 in | Wt 161.2 lb

## 2016-01-10 DIAGNOSIS — F329 Major depressive disorder, single episode, unspecified: Secondary | ICD-10-CM | POA: Diagnosis not present

## 2016-01-10 DIAGNOSIS — I10 Essential (primary) hypertension: Secondary | ICD-10-CM

## 2016-01-10 DIAGNOSIS — R079 Chest pain, unspecified: Secondary | ICD-10-CM | POA: Diagnosis not present

## 2016-01-10 DIAGNOSIS — E1142 Type 2 diabetes mellitus with diabetic polyneuropathy: Secondary | ICD-10-CM

## 2016-01-10 DIAGNOSIS — F32A Depression, unspecified: Secondary | ICD-10-CM

## 2016-01-10 NOTE — Assessment & Plan Note (Signed)
Blood sugars are better controlled. She'll continue metformin and glipizide. We will check an A1c at her next visit. Advised to see ophthalmologist.

## 2016-01-10 NOTE — Patient Instructions (Signed)
Nice to see you. I am glad you are doing better from last week. Please continue the Zoloft. Please continue to monitor your blood pressure and if it is persistently greater than 140/90 please let us know. Please keep your appointment with the cardiologist next week. If you develop persistent chest pain, shortness of breath, sweating, worsening depression, worsening anxiety, thoughts of harming herself or others, or any new or changing symptoms please seek medical attention.

## 2016-01-10 NOTE — Assessment & Plan Note (Signed)
Well-controlled today. Reports has been mostly at goal at home. She'll continue to monitor. If begins running greater than 140/90 consistently we will add a medication.

## 2016-01-10 NOTE — Assessment & Plan Note (Signed)
Patient with depression and anxiety. Quite a bit improved in just a week on Zoloft. She'll continue this medication. I encouraged her to seek counselor. No SI or HI. Given return precautions.

## 2016-01-10 NOTE — Assessment & Plan Note (Addendum)
Much less frequent and with mostly atypical components. Had not had any shortness of breath recently. Prior EKG with nonspecific ST changes. She had negative troponin at her last visit. Suspect likely musculoskeletal given discomfort on exam or related to anxiety. Does have risk factors for cardiac disease and will keep her appointment with cardiology next week. She is given return precautions.

## 2016-01-10 NOTE — Progress Notes (Signed)
Patient ID: Tammy HumDeborah H Acosta, female   DOB: 07/14/1956, 60 y.o.   MRN: [id number]  Marikay AlarEric Dillyn Joaquin, MD Phone: [phone]  Tammy Acosta is a 60 y.o. female who presents today for f/u.  HYPERTENSION Disease Monitoring Home BP Monitoring no greater than 142/89 since our last visit Chest pain- yes, see below    Dyspnea- no Medications Compliance-  Taking clonidine, lisinopril, and HCTZ. Lightheadedness-  Rare on standing  Edema- no  DIABETES Disease Monitoring: Blood Sugar ranges-160's Polyuria/phagia/dipsia- no      Optho- seen in last year Medications: Compliance- taking metformin and glipizide Hypoglycemic symptoms- rare if does not eat prior to taking glipizide  Depression: Patient notes this is quite a bit improved in the last week. She's been taking Zoloft and has noticed a difference in last 1-2 days. Hasn't sought counseling at  this time as she is in the midst of a move. No SI or HI.  Chest pain: she notes this is improved as well from her last visit. Is occurring rarely now. Is sharp and central. Sometimes goes to her neck. Can last for only the whole day. No shortness of breath or diaphoresis. Comes out of nowhere and can occur at rest or with exertion. Does not worsen with exertion. No history of blood clot.Leg swelling. No recent trips or surgeries.   PMH Current smoker    ROS see history of present illness  Objective  Physical Exam Filed Vitals:   01/10/16 0808  BP: 114/78  Pulse: 91  Temp: 98.1 F (36.7 C)    BP Readings from Last 3 Encounters:  01/10/16 114/78  01/02/16 154/98  12/03/15 136/94   Wt Readings from Last 3 Encounters:  01/10/16 161 lb 3.2 oz (73.12 kg)  01/02/16 163 lb 6.4 oz (74.118 kg)  12/03/15 166 lb 9.6 oz (75.569 kg)    Physical Exam  Constitutional: She is well-developed, well-nourished, and in no distress.  HENT:  Head: Normocephalic and atraumatic.  Right Ear: External ear normal.  Left Ear: External ear normal.    Mouth/Throat: Oropharynx is clear and moist.  Eyes: Conjunctivae are normal. Pupils are equal, round, and reactive to light.  Cardiovascular: Normal rate, regular rhythm and normal heart sounds.   Pulmonary/Chest: Effort normal and breath sounds normal. She exhibits tenderness (bilateral costochondral joints).  Neurological: She is alert. Gait normal.  Skin: Skin is warm and dry. She is not diaphoretic.  Psychiatric:  Mood mildly depressed and anxious, affect mildly anxious     Assessment/Plan: Please see individual problem list.  Chest pain Much less frequent and with mostly atypical components. Had not had any shortness of breath recently. Prior EKG with nonspecific ST changes. She had negative troponin at her last visit. Suspect likely musculoskeletal given discomfort on exam or related to anxiety. Does have risk factors for cardiac disease and will keep her appointment with cardiology next week. She is given return precautions.  Clinical depression Patient with depression and anxiety. Quite a bit improved in just a week on Zoloft. She'll continue this medication. I encouraged her to seek counselor. No SI or HI. Given return precautions.  Essential (primary) hypertension Well-controlled today. Reports has been mostly at goal at home. She'll continue to monitor. If begins running greater than 140/90 consistently we will add a medication.  Type 2 diabetes mellitus (HCC) Blood sugars are better controlled. She'll continue metformin and glipizide. We will check an A1c at her next visit. Advised to see ophthalmologist.     Marikay AlarEric Phyllis Whitefield,  MD [place]

## 2016-01-15 ENCOUNTER — Ambulatory Visit: Payer: Medicare Other | Attending: Pain Medicine | Admitting: Pain Medicine

## 2016-01-15 ENCOUNTER — Encounter: Payer: Self-pay | Admitting: Pain Medicine

## 2016-01-15 VITALS — BP 156/69 | HR 78 | Temp 98.6°F | Resp 19 | Ht 63.0 in | Wt 162.0 lb

## 2016-01-15 DIAGNOSIS — M545 Low back pain: Secondary | ICD-10-CM | POA: Insufficient documentation

## 2016-01-15 DIAGNOSIS — M539 Dorsopathy, unspecified: Secondary | ICD-10-CM

## 2016-01-15 DIAGNOSIS — E1140 Type 2 diabetes mellitus with diabetic neuropathy, unspecified: Secondary | ICD-10-CM | POA: Insufficient documentation

## 2016-01-15 DIAGNOSIS — F329 Major depressive disorder, single episode, unspecified: Secondary | ICD-10-CM | POA: Insufficient documentation

## 2016-01-15 DIAGNOSIS — Z6828 Body mass index (BMI) 28.0-28.9, adult: Secondary | ICD-10-CM | POA: Diagnosis not present

## 2016-01-15 DIAGNOSIS — F17210 Nicotine dependence, cigarettes, uncomplicated: Secondary | ICD-10-CM | POA: Insufficient documentation

## 2016-01-15 DIAGNOSIS — G8929 Other chronic pain: Secondary | ICD-10-CM

## 2016-01-15 DIAGNOSIS — E7800 Pure hypercholesterolemia, unspecified: Secondary | ICD-10-CM | POA: Insufficient documentation

## 2016-01-15 DIAGNOSIS — R200 Anesthesia of skin: Secondary | ICD-10-CM | POA: Diagnosis not present

## 2016-01-15 DIAGNOSIS — M25559 Pain in unspecified hip: Secondary | ICD-10-CM

## 2016-01-15 DIAGNOSIS — Z86010 Personal history of colonic polyps: Secondary | ICD-10-CM | POA: Insufficient documentation

## 2016-01-15 DIAGNOSIS — M160 Bilateral primary osteoarthritis of hip: Secondary | ICD-10-CM

## 2016-01-15 DIAGNOSIS — M5416 Radiculopathy, lumbar region: Secondary | ICD-10-CM | POA: Insufficient documentation

## 2016-01-15 DIAGNOSIS — I10 Essential (primary) hypertension: Secondary | ICD-10-CM | POA: Insufficient documentation

## 2016-01-15 DIAGNOSIS — M79606 Pain in leg, unspecified: Secondary | ICD-10-CM

## 2016-01-15 DIAGNOSIS — M961 Postlaminectomy syndrome, not elsewhere classified: Secondary | ICD-10-CM

## 2016-01-15 DIAGNOSIS — Z5181 Encounter for therapeutic drug level monitoring: Secondary | ICD-10-CM

## 2016-01-15 DIAGNOSIS — Z79891 Long term (current) use of opiate analgesic: Secondary | ICD-10-CM

## 2016-01-15 DIAGNOSIS — E785 Hyperlipidemia, unspecified: Secondary | ICD-10-CM | POA: Diagnosis not present

## 2016-01-15 DIAGNOSIS — M792 Neuralgia and neuritis, unspecified: Secondary | ICD-10-CM

## 2016-01-15 DIAGNOSIS — M549 Dorsalgia, unspecified: Secondary | ICD-10-CM | POA: Diagnosis present

## 2016-01-15 MED ORDER — MORPHINE SULFATE ER 15 MG PO TBCR: 15.0000 mg | EXTENDED_RELEASE_TABLET | Freq: Two times a day (BID) | ORAL | Status: DC

## 2016-01-15 MED ORDER — GABAPENTIN 800 MG PO TABS: 800.0000 mg | ORAL_TABLET | Freq: Four times a day (QID) | ORAL | Status: DC

## 2016-01-15 NOTE — Progress Notes (Signed)
Patient's Name: Tammy Acosta  Patient type: Established  MRN: [id number]  Service setting: Ambulatory outpatient  DOB: August 31, 1955  Location: [facility]  DOS: 01/15/2016  Primary Care Physician: Tommi Rumps, MD  Note by: Kathlen Brunswick. Dossie Arbour, M.D, DABA, DABAPM, DABPM, DABIPP, FIPP  Referring Physician: Leone Haven, MD  Specialty: Board-Certified Interventional Pain Management  Last Visit to Pain Management: 11/22/2015   Primary Reason(s) for Visit: Encounter for prescription drug management (Level of risk: moderate) CC: Back Pain   HPI  Tammy Acosta is a 60 y.o. year old, female patient, who returns today as an established patient. She has Chronic pain; Failed back surgical syndrome; Chronic lumbar radicular pain and numbness (Location of Secondary source of pain) (Bilateral) (R>L); Opiate use (30 MME/Day); Opioid dependence ([place]); Long term prescription opiate use; Long term current use of opiate analgesic; Encounter for therapeutic drug level monitoring; Chronic low back pain (Location of Primary Source of Pain) (Bilateral) (L>R); Chronic pain syndrome; Clinical depression; Type 2 diabetes mellitus ([place]); Essential (primary) hypertension; HLD (hyperlipidemia); Encounter for chronic pain management; Neurogenic pain; Neuropathic pain; Musculoskeletal pain; Chronic pain of lower extremity (Location of Secondary source of pain) (Bilateral) (R>L); Chronic hip pain (Location of Tertiary source of pain) (Bilateral) (L>R); Osteoarthritis of hip (Location of Tertiary source of pain) (Bilateral) (L>R); Diabetic peripheral neuropathy ([place]); Hypomagnesemia; Tobacco abuse; Viral upper respiratory illness; Chest pain; Abnormal mental state; and Back pain, chronic on her problem list.. Her primarily concern today is the Back Pain   Pain Assessment: Self-Reported Pain Score: 3  Reported level is compatible with observation Pain Type: Chronic pain Pain Location: Back Pain  Orientation: Lower Pain Descriptors / Indicators: Aching Pain Frequency: Constant  The patient comes into the clinics today for pharmacological management of her chronic pain. I last saw this patient on 10/21/2015. The patient  reports that she does not use illicit drugs. Her body mass index is 28.7 kg/(m^2).  Date of Last Visit: 10/21/15 Service Provided on Last Visit: Med Refill  Controlled Substance Pharmacotherapy Assessment & REMS (Risk Evaluation and Mitigation Strategy)  Analgesic: MS Contin (morphine ER) 15 mg every 12 hours (30 mg/day) Pill Count: Morphine 15 mg #23/60 Filled 12/27/15. MME/day: 30 mg/day Pharmacokinetics: Onset of action (Liberation/Absorption): Within expected pharmacological parameters Time to Peak effect (Distribution): Timing and results are as within normal expected parameters Duration of action (Metabolism/Excretion): Within normal limits for medication Pharmacodynamics: Analgesic Effect: More than 50%  Activity Facilitation: Medication(s) allow patient to sit, stand, walk, and do the basic ADLs Perceived Effectiveness: Described as relatively effective, allowing for increase in activities of daily living (ADL) Side-effects or Adverse reactions: None reported Monitoring: [place] PMP: Online review of the past 24-monthperiod conducted. Compliant with practice rules and regulations UDS Results/interpretation: The patient's last UDS was done on 10/21/2015 and it came back within normal limits with no unexpected results. Medication Assessment Form: Reviewed. Patient indicates being compliant with therapy Treatment compliance: Compliant Risk Assessment: Aberrant Behavior: None observed today Substance Use Disorder (SUD) Risk Level: Low-to-moderate Risk of opioid abuse or dependence: 0.7-3.0% with doses ? 36 MME/day and 6.1-26% with doses ? 120 MME/day. Opioid Risk Tool (ORT) Score: Total Score: 1 Low Risk for SUD (Score <3) Depression Scale Score: PHQ-2: PHQ-2  Total Score: 1 15.4% Probability of major depressive disorder (1) PHQ-9: PHQ-9 Total Score: 3 No depression (0-4)  Pharmacologic Plan: No change in therapy, at this time  Laboratory Chemistry  Inflammation Markers Lab Results  Component Value  Date   ESRSEDRATE 18 10/21/2015   CRP 1.2* 10/21/2015    Renal Function Lab Results  Component Value Date   BUN 22 01/02/2016   CREATININE 1.13 01/02/2016   GFRAA >60 10/21/2015   GFRNONAA 57* 10/21/2015    Hepatic Function Lab Results  Component Value Date   AST 14 01/02/2016   ALT 11 01/02/2016   ALBUMIN 4.3 01/02/2016    Electrolytes Lab Results  Component Value Date   NA 138 01/02/2016   K 3.9 01/02/2016   CL 101 01/02/2016   CALCIUM 9.2 01/02/2016   MG 1.5* 10/21/2015    Pain Modulating Vitamins No results found for: [place], VD125OH2TOT, EV0350KX3, GH8299BZ1, VITAMINB12  Coagulation Parameters No results found for: INR, LABPROT  Note: I personally reviewed the above data. Results made available to patient.  Recent Diagnostic Imaging  No results found.  Meds  The patient has a current medication list which includes the following prescription(s): acetaminophen, blood glucose meter kit and supplies, one touch ultra 2, clonidine, gabapentin, glipizide, hydrochlorothiazide, lisinopril, magnesium oxide, metformin, morphine, morphine, morphine, sertraline, simvastatin, tizanidine, and gabapentin.  Current Outpatient Prescriptions on File Prior to Visit  Medication Sig  . acetaminophen (RA ACETAMINOPHEN) 650 MG CR tablet Take 1,300 mg by mouth daily.   . blood glucose meter kit and supplies Dispense One touch meter.  E11.9  . Blood Glucose Monitoring Suppl (ONE TOUCH ULTRA 2) w/Device KIT   . cloNIDine (CATAPRES) 0.3 MG tablet Take 0.3 mg by mouth 2 (two) times daily.  Marland Kitchen gabapentin (NEURONTIN) 600 MG tablet Take 1 tablet (600 mg total) by mouth every 6 (six) hours.  Marland Kitchen glipiZIDE (GLUCOTROL) 5 MG tablet Take 1 tablet (5 mg  total) by mouth daily before breakfast.  . hydrochlorothiazide (HYDRODIURIL) 25 MG tablet Take 1 tablet (25 mg total) by mouth daily.  Marland Kitchen lisinopril (PRINIVIL,ZESTRIL) 40 MG tablet Take 1 tablet (40 mg total) by mouth daily.  . Magnesium Oxide 500 MG CAPS Take 1 capsule (500 mg total) by mouth daily.  . metFORMIN (GLUCOPHAGE) 1000 MG tablet Take 1,000 mg by mouth 2 (two) times daily with a meal.  . sertraline (ZOLOFT) 50 MG tablet Please take 25 mg (1/2 tablet) by mouth daily for 7 days, then 50 mg (1 tablet) by mouth daily  . simvastatin (ZOCOR) 40 MG tablet Take 1 tablet (40 mg total) by mouth every evening.  Marland Kitchen tiZANidine (ZANAFLEX) 4 MG tablet Take 4 mg by mouth 3 (three) times daily.   No current facility-administered medications on file prior to visit.    ROS  Constitutional: Denies any fever or chills Gastrointestinal: No reported hemesis, hematochezia, vomiting, or acute GI distress Musculoskeletal: Denies any acute onset joint swelling, redness, loss of ROM, or weakness Neurological: No reported episodes of acute onset apraxia, aphasia, dysarthria, agnosia, amnesia, paralysis, loss of coordination, or loss of consciousness  Allergies  Ms. Dietze is allergic to penicillins and tetracyclines & related.  [place]  Medical:  Ms. Maiorana  has a past medical history of Diabetes mellitus without complication ([place]); Hypertension; Hyperlipidemia; Hypercholesteremia; Spine disorder; Arthritis; Depression; and Colon polyps. Family: family history includes Heart disease in her father and mother; Hypertension in her mother. Surgical:  has past surgical history that includes Back surgery; Abdominal hysterectomy; Cholecystectomy; and Kyphoplasty. Tobacco:  reports that she has been smoking Cigarettes.  She does not have any smokeless tobacco history on file. Alcohol:  reports that she does not drink alcohol. Drug:  reports that she does not use  illicit drugs.  Constitutional Exam  Vitals: Blood  pressure 156/69, pulse 78, temperature 98.6 F (37 C), temperature source Oral, resp. rate 19, height '5\' 3"'$  (1.6 m), weight 162 lb (73.483 kg), SpO2 97 %. General appearance: Well nourished, well developed, and well hydrated. In no acute distress Calculated BMI/Body habitus: Body mass index is 28.7 kg/(m^2). (25-29.9 kg/m2) Overweight - 20% higher incidence of chronic pain Psych/Mental status: Alert and oriented x 3 (person, place, & time) Eyes: PERLA Respiratory: No evidence of acute respiratory distress  Cervical Spine Exam  Inspection: No masses, redness, or swelling Alignment: Symmetrical ROM: Functional: ROM is within functional limits [facility]) Stability: No instability detected Muscle strength & Tone: Functionally intact Sensory: Unimpaired Palpation: No complaints of tenderness  Upper Extremity (UE) Exam    Side: Right upper extremity  Side: Left upper extremity  Inspection: No masses, redness, swelling, or asymmetry  Inspection: No masses, redness, swelling, or asymmetry  ROM:  ROM:  Functional: ROM is within functional limits [facility])  Functional: ROM is within functional limits [facility])  Muscle strength & Tone: Functionally intact  Muscle strength & Tone: Functionally intact  Sensory: Unimpaired  Sensory: Unimpaired  Palpation: Non-contributory  Palpation: Non-contributory   Thoracic Spine Exam  Inspection: No masses, redness, or swelling Alignment: Symmetrical ROM: Functional: ROM is within functional limits [facility]) Stability: No instability detected Sensory: Unimpaired Muscle strength & Tone: Functionally intact Palpation: No complaints of tenderness  Lumbar Spine Exam  Inspection: No masses, redness, or swelling Alignment: Symmetrical ROM: Functional: Decreased ROM Stability: No instability detected Muscle strength & Tone: Functionally intact Sensory: Unimpaired Palpation: Tender Provocative Tests: Lumbar Hyperextension and rotation test: Positive for bilateral lumbar  facet pain. Patrick's Maneuver: deferred  Gait & Posture Assessment  Ambulation: Unassisted Gait: Unaffected Posture: WNL  Lower Extremity Exam    Side: Right lower extremity  Side: Left lower extremity  Inspection: No masses, redness, swelling, or asymmetry ROM:  Inspection: No masses, redness, swelling, or asymmetry ROM:  Functional: ROM is within functional limits [facility])  Functional: ROM is within functional limits [facility])  Muscle strength & Tone: Functionally intact  Muscle strength & Tone: Functionally intact  Sensory: Unimpaired  Sensory: Unimpaired  Palpation: Non-contributory  Palpation: Non-contributory   Assessment & Plan  Primary Diagnosis & Pertinent Problem List: The primary encounter diagnosis was Chronic pain. Diagnoses of Encounter for therapeutic drug level monitoring, Long term current use of opiate analgesic, Chronic low back pain (Location of Primary Source of Pain) (Bilateral) (L>R), Chronic lumbar radicular pain and numbness (Location of Secondary source of pain) (Bilateral) (R>L), Primary osteoarthritis of both hips, Neurogenic pain, Chronic hip pain, unspecified laterality, Chronic pain of lower extremity, unspecified laterality, and Failed back surgical syndrome were also pertinent to this visit.  Visit Diagnosis: 1. Chronic pain   2. Encounter for therapeutic drug level monitoring   3. Long term current use of opiate analgesic   4. Chronic low back pain (Location of Primary Source of Pain) (Bilateral) (L>R)   5. Chronic lumbar radicular pain and numbness (Location of Secondary source of pain) (Bilateral) (R>L)   6. Primary osteoarthritis of both hips   7. Neurogenic pain   8. Chronic hip pain, unspecified laterality   9. Chronic pain of lower extremity, unspecified laterality   10. Failed back surgical syndrome     Problems updated and reviewed during this visit: Problem  Opiate use (30 MME/Day)  Back Pain, Chronic  Abnormal Mental State     Problem-specific Plan(s): No problem-specific assessment &  plan notes found for this encounter.  No new assessment & plan notes have been filed under this hospital service since the last note was generated. Service: Pain Management   Plan of Care   Problem List Items Addressed This Visit      High   Chronic hip pain (Location of Tertiary source of pain) (Bilateral) (L>R) (Chronic)   Relevant Medications   gabapentin (NEURONTIN) 800 MG tablet   morphine (MS CONTIN) 15 MG 12 hr tablet   morphine (MS CONTIN) 15 MG 12 hr tablet   morphine (MS CONTIN) 15 MG 12 hr tablet   Other Relevant Orders   HIP INJECTION   Chronic low back pain (Location of Primary Source of Pain) (Bilateral) (L>R) (Chronic)   Relevant Medications   morphine (MS CONTIN) 15 MG 12 hr tablet   morphine (MS CONTIN) 15 MG 12 hr tablet   morphine (MS CONTIN) 15 MG 12 hr tablet   Other Relevant Orders   LUMBAR FACET(MEDIAL BRANCH NERVE BLOCK) MBNB   Chronic lumbar radicular pain and numbness (Location of Secondary source of pain) (Bilateral) (R>L) (Chronic)   Relevant Orders   LUMBAR EPIDURAL STEROID INJECTION   LUMBAR EPIDURAL STEROID INJECTION   Lumbar Transforaminal epidural without steroid   Chronic pain - Primary (Chronic)   Relevant Medications   gabapentin (NEURONTIN) 800 MG tablet   morphine (MS CONTIN) 15 MG 12 hr tablet   morphine (MS CONTIN) 15 MG 12 hr tablet   morphine (MS CONTIN) 15 MG 12 hr tablet   Chronic pain of lower extremity (Location of Secondary source of pain) (Bilateral) (R>L) (Chronic)   Relevant Orders   LUMBAR EPIDURAL STEROID INJECTION   LUMBAR EPIDURAL STEROID INJECTION   Lumbar Transforaminal epidural without steroid   Failed back surgical syndrome (Chronic)   Relevant Medications   morphine (MS CONTIN) 15 MG 12 hr tablet   morphine (MS CONTIN) 15 MG 12 hr tablet   morphine (MS CONTIN) 15 MG 12 hr tablet   Neurogenic pain (Chronic)   Relevant Medications   gabapentin  (NEURONTIN) 800 MG tablet   Osteoarthritis of hip (Location of Tertiary source of pain) (Bilateral) (L>R) (Chronic)   Relevant Medications   morphine (MS CONTIN) 15 MG 12 hr tablet   morphine (MS CONTIN) 15 MG 12 hr tablet   morphine (MS CONTIN) 15 MG 12 hr tablet   Other Relevant Orders   HIP INJECTION     Medium   Encounter for therapeutic drug level monitoring   Long term current use of opiate analgesic (Chronic)   Relevant Orders   ToxASSURE Select 13 (MW), Urine       Pharmacotherapy (Medications Ordered): Meds ordered this encounter  Medications  . gabapentin (NEURONTIN) 800 MG tablet    Sig: Take 1 tablet (800 mg total) by mouth every 6 (six) hours.    Dispense:  120 tablet    Refill:  2    Do not place this medication, or any other prescription from our practice, on "Automatic Refill". Patient may have prescription filled one day early if pharmacy is closed on scheduled refill date.  . morphine (MS CONTIN) 15 MG 12 hr tablet    Sig: Take 1 tablet (15 mg total) by mouth every 12 (twelve) hours.    Dispense:  60 tablet    Refill:  0    Do not place this medication, or any other prescription from our practice, on "Automatic Refill". Patient may have prescription filled one day early  if pharmacy is closed on scheduled refill date. Do not fill until: 01/19/16 To last until: 02/18/16  . morphine (MS CONTIN) 15 MG 12 hr tablet    Sig: Take 1 tablet (15 mg total) by mouth every 12 (twelve) hours.    Dispense:  60 tablet    Refill:  0    Do not place this medication, or any other prescription from our practice, on "Automatic Refill". Patient may have prescription filled one day early if pharmacy is closed on scheduled refill date. Do not fill until: 02/18/16 To last until: 03/19/16  . morphine (MS CONTIN) 15 MG 12 hr tablet    Sig: Take 1 tablet (15 mg total) by mouth every 12 (twelve) hours.    Dispense:  60 tablet    Refill:  0    Do not place this medication, or any other  prescription from our practice, on "Automatic Refill". Patient may have prescription filled one day early if pharmacy is closed on scheduled refill date. Do not fill until: 03/19/16 To last until: 04/18/16    [facility] & Procedure Ordered: Orders Placed This Encounter  Procedures  . LUMBAR EPIDURAL STEROID INJECTION  . LUMBAR FACET(MEDIAL BRANCH NERVE BLOCK) MBNB  . HIP INJECTION  . LUMBAR EPIDURAL STEROID INJECTION  . Lumbar Transforaminal epidural without steroid  . ToxASSURE Select 13 (MW), Urine    Imaging Ordered: None  Interventional Therapies: Scheduled:  None at this point.    Considering:   1. Diagnostic bilateral intra-articular hip joint injection under fluoroscopic guidance, with or without sedation.  2. Bilateral diagnostic lumbar facet block under fluoroscopic guidance and IV sedation.  3. Right L4-5 lumbar epidural steroid injection under fluoroscopic guidance, without without sedation.  4. Palliative bilateral L5-S1 transforaminal epidural steroid injection under fluoroscopic guidance and IV sedation.  5. Palliative caudal epidural steroid injection under fluoroscopic guidance, with or without sedation.    PRN Procedures:   1. Diagnostic bilateral intra-articular hip joint injection under fluoroscopic guidance, with or without sedation.  2. Bilateral diagnostic lumbar facet block under fluoroscopic guidance and IV sedation.  3. Right L4-5 lumbar epidural steroid injection under fluoroscopic guidance, without without sedation.  4. Palliative bilateral L5-S1 transforaminal epidural steroid injection under fluoroscopic guidance and IV sedation.  5. Palliative caudal epidural steroid injection under fluoroscopic guidance, with or without sedation.    Referral(s) or Consult(s): None at this time.  New Prescriptions   GABAPENTIN (NEURONTIN) 800 MG TABLET    Take 1 tablet (800 mg total) by mouth every 6 (six) hours.    Medications administered during this visit: Ms.  Swango had no medications administered during this visit.  Requested PM Follow-up: Return in about 2 months (around 03/30/2016) for Medication Management, (3-Mo), Procedure (PRN - Patient will call).  Future Appointments Date Time Provider [place]  01/16/2016 9:30 AM Wende Bushy, MD [facility]  02/13/2016 3:30 PM Leone Haven, MD [facility] None  03/30/2016 10:20 AM Milinda Pointer, MD [facility] None    Primary Care Physician: Tommi Rumps, MD Location: [facility] Note by: Kathlen Brunswick. Dossie Arbour, M.D, DABA, DABAPM, DABPM, DABIPP, FIPP  Pain Score Disclaimer: We use the NRS-11 scale. This is a self-reported, subjective measurement of pain severity with only modest accuracy. It is used primarily to identify changes within a particular patient. It must be understood that outpatient pain scales are significantly less accurate that those used for research, where they can be applied under ideal controlled circumstances with minimal exposure to  variables. In reality, the score is likely to be a combination of pain intensity and pain affect, where pain affect describes the degree of emotional arousal or changes in action readiness caused by the sensory experience of pain. Factors such as social and work situation, setting, emotional state, anxiety levels, expectation, and prior pain experience may influence pain perception and show large inter-individual differences that may also be affected by time variables.  Patient instructions provided during this appointment: There are no Patient Instructions on file for this visit.

## 2016-01-15 NOTE — Progress Notes (Signed)
Safety precautions to be maintained throughout the outpatient stay will include: orient to surroundings, keep bed in low position, maintain call bell within reach at all times, provide assistance with transfer out of bed and ambulation. Morphine 15 mg #23/60 Filled 12/27/15.

## 2016-01-16 ENCOUNTER — Encounter: Payer: Self-pay | Admitting: Cardiology

## 2016-01-16 ENCOUNTER — Ambulatory Visit (INDEPENDENT_AMBULATORY_CARE_PROVIDER_SITE_OTHER): Payer: Medicare Other | Admitting: Cardiology

## 2016-01-16 VITALS — BP 140/88 | HR 79 | Ht 63.0 in | Wt 162.2 lb

## 2016-01-16 DIAGNOSIS — F17200 Nicotine dependence, unspecified, uncomplicated: Secondary | ICD-10-CM

## 2016-01-16 DIAGNOSIS — R079 Chest pain, unspecified: Secondary | ICD-10-CM | POA: Diagnosis not present

## 2016-01-16 DIAGNOSIS — R0602 Shortness of breath: Secondary | ICD-10-CM

## 2016-01-16 DIAGNOSIS — I10 Essential (primary) hypertension: Secondary | ICD-10-CM | POA: Diagnosis not present

## 2016-01-16 MED ORDER — ASPIRIN EC 81 MG PO TBEC: 81.0000 mg | DELAYED_RELEASE_TABLET | Freq: Every day | ORAL | Status: DC

## 2016-01-16 MED ORDER — NITROGLYCERIN 0.4 MG SL SUBL: 0.4000 mg | SUBLINGUAL_TABLET | SUBLINGUAL | Status: DC | PRN

## 2016-01-16 NOTE — Patient Instructions (Addendum)
Medication Instructions:  Your physician has recommended you make the following change in your medication:  1. START Aspirin 81 mg Once Daily  Samples of this drug were given to the patient, quantity 64 tablets, Lot Number [id number]  2. Nitroglycerin as needed for chest pain. Please read detailed instructions below regarding this medication.   Labwork: None ordered  Testing/Procedures: Your physician has requested that you have an echocardiogram. Echocardiography is a painless test that uses sound waves to create images of your heart. It provides your doctor with information about the size and shape of your heart and how well your heart's chambers and valves are working. This procedure takes approximately one hour. There are no restrictions for this procedure.  Date & Time: ___________________________________________________  [facility]  Your caregiver has ordered a Stress Test with nuclear imaging. The purpose of this test is to evaluate the blood supply to your heart muscle. This procedure is referred to as a "Non-Invasive Stress Test." This is because other than having an IV started in your vein, nothing is inserted or "invades" your body. Cardiac stress tests are done to find areas of poor blood flow to the heart by determining the extent of coronary artery disease (CAD). Some patients exercise on a treadmill, which naturally increases the blood flow to your heart, while others who are  unable to walk on a treadmill due to physical limitations have a pharmacologic/chemical stress agent called Lexiscan . This medicine will mimic walking on a treadmill by temporarily increasing your coronary blood flow.   Please note: these test may take anywhere between 2-4 hours to complete  PLEASE REPORT TO [facility]  THE VOLUNTEERS AT THE FIRST DESK WILL DIRECT YOU WHERE TO GO  Date of Procedure:__Wednesday Jan 22, 2016 at 08:30AM_____  Arrival Time for Procedure:__Arrive at 08:15AM  to register___  Instructions regarding medication:   __X__ : Hold diabetes medication Glipizide the morning of procedure  __X__:  Hold Metformin the morning of your procedure.   PLEASE NOTIFY THE OFFICE AT LEAST 24 HOURS IN ADVANCE IF YOU ARE UNABLE TO KEEP YOUR APPOINTMENT.  [phone] AND  PLEASE NOTIFY NUCLEAR MEDICINE AT [facility] AT LEAST 24 HOURS IN ADVANCE IF YOU ARE UNABLE TO KEEP YOUR APPOINTMENT. [phone]  How to prepare for your Myoview test:   Do not eat or drink after midnight  No caffeine for 24 hours prior to test  No smoking 24 hours prior to test.  Your medication may be taken with water.  If your doctor stopped a medication because of this test, do not take that medication.  Ladies, please do not wear dresses.  Skirts or pants are appropriate. Please wear a short sleeve shirt.  No perfume, cologne or lotion.  Wear comfortable walking shoes. No heels!    Follow-Up: Your physician recommends that you schedule a follow-up appointment after testing to review results.  Date & Time: _________________________________________________________________   Any Other Special Instructions Will Be Listed Below (If Applicable).     If you need a refill on your cardiac medications before your next appointment, please call your pharmacy.  Nitroglycerin sublingual tablets What is this medicine? NITROGLYCERIN (nye troe GLI ser in) is a type of vasodilator. It relaxes blood vessels, increasing the blood and oxygen supply to your heart. This medicine is used to relieve chest pain caused by angina. It is also used to prevent chest pain before activities like climbing stairs, going outdoors in cold weather, or sexual activity. This medicine  may be used for other purposes; ask your health care provider or pharmacist if you have questions. What should I tell my health care provider before I take this medicine? They need to know if you have any of these  conditions: -anemia -head injury, recent stroke, or bleeding in the brain -liver disease -previous heart attack -an unusual or allergic reaction to nitroglycerin, other medicines, foods, dyes, or preservatives -pregnant or trying to get pregnant -breast-feeding How should I use this medicine? Take this medicine by mouth as needed. At the first sign of an angina attack (chest pain or tightness) place one tablet under your tongue. You can also take this medicine 5 to 10 minutes before an event likely to produce chest pain. Follow the directions on the prescription label. Let the tablet dissolve under the tongue. Do not swallow whole. Replace the dose if you accidentally swallow it. It will help if your mouth is not dry. Saliva around the tablet will help it to dissolve more quickly. Do not eat or drink, smoke or chew tobacco while a tablet is dissolving. If you are not better within 5 minutes after taking ONE dose of nitroglycerin, call 9-1-1 immediately to seek emergency medical care. Do not take more than 3 nitroglycerin tablets over 15 minutes. If you take this medicine often to relieve symptoms of angina, your doctor or health care professional may provide you with different instructions to manage your symptoms. If symptoms do not go away after following these instructions, it is important to call 9-1-1 immediately. Do not take more than 3 nitroglycerin tablets over 15 minutes. Talk to your pediatrician regarding the use of this medicine in children. Special care may be needed. Overdosage: If you think you have taken too much of this medicine contact a poison control center or emergency room at once. NOTE: This medicine is only for you. Do not share this medicine with others. What if I miss a dose? This does not apply. This medicine is only used as needed. What may interact with this medicine? Do not take this medicine with any of the following medications: -certain migraine medicines like  ergotamine and dihydroergotamine (DHE) -medicines used to treat erectile dysfunction like sildenafil, tadalafil, and vardenafil -riociguat This medicine may also interact with the following medications: -alteplase -aspirin -heparin -medicines for high blood pressure -medicines for mental depression -other medicines used to treat angina -phenothiazines like chlorpromazine, mesoridazine, prochlorperazine, thioridazine This list may not describe all possible interactions. Give your health care provider a list of all the medicines, herbs, non-prescription drugs, or dietary supplements you use. Also tell them if you smoke, drink alcohol, or use illegal drugs. Some items may interact with your medicine. What should I watch for while using this medicine? Tell your doctor or health care professional if you feel your medicine is no longer working. Keep this medicine with you at all times. Sit or lie down when you take your medicine to prevent falling if you feel dizzy or faint after using it. Try to remain calm. This will help you to feel better faster. If you feel dizzy, take several deep breaths and lie down with your feet propped up, or bend forward with your head resting between your knees. You may get drowsy or dizzy. Do not drive, use machinery, or do anything that needs mental alertness until you know how this drug affects you. Do not stand or sit up quickly, especially if you are an older patient. This reduces the risk of dizzy or  fainting spells. Alcohol can make you more drowsy and dizzy. Avoid alcoholic drinks. Do not treat yourself for coughs, colds, or pain while you are taking this medicine without asking your doctor or health care professional for advice. Some ingredients may increase your blood pressure. What side effects may I notice from receiving this medicine? Side effects that you should report to your doctor or health care professional as soon as possible: -blurred vision -dry  mouth -skin rash -sweating -the feeling of extreme pressure in the head -unusually weak or tired Side effects that usually do not require medical attention (report to your doctor or health care professional if they continue or are bothersome): -flushing of the face or neck -headache -irregular heartbeat, palpitations -nausea, vomiting This list may not describe all possible side effects. Call your doctor for medical advice about side effects. You may report side effects to FDA at 1-800-FDA-1088. Where should I keep my medicine? Keep out of the reach of children. Store at room temperature between 20 and 25 degrees C (68 and 77 degrees F). Store in Retail buyer. Protect from light and moisture. Keep tightly closed. Throw away any unused medicine after the expiration date. NOTE: This sheet is a summary. It may not cover all possible information. If you have questions about this medicine, talk to your doctor, pharmacist, or health care provider.    2016, Elsevier/Gold Standard. (2013-06-08 17:57:36)   Echocardiogram An echocardiogram, or echocardiography, uses sound waves (ultrasound) to produce an image of your heart. The echocardiogram is simple, painless, obtained within a short period of time, and offers valuable information to your health care provider. The images from an echocardiogram can provide information such as:  Evidence of coronary artery disease (CAD).  Heart size.  Heart muscle function.  Heart valve function.  Aneurysm detection.  Evidence of a past heart attack.  Fluid buildup around the heart.  Heart muscle thickening.  Assess heart valve function. LET [facility] CARE PROVIDER KNOW ABOUT:  Any allergies you have.  All medicines you are taking, including vitamins, herbs, eye drops, creams, and over-the-counter medicines.  Previous problems you or members of your family have had with the use of anesthetics.  Any blood disorders you have.  Previous  surgeries you have had.  Medical conditions you have.  Possibility of pregnancy, if this applies. BEFORE THE PROCEDURE  No special preparation is needed. Eat and drink normally.  PROCEDURE   In order to produce an image of your heart, gel will be applied to your chest and a wand-like tool (transducer) will be moved over your chest. The gel will help transmit the sound waves from the transducer. The sound waves will harmlessly bounce off your heart to allow the heart images to be captured in real-time motion. These images will then be recorded.  You may need an IV to receive a medicine that improves the quality of the pictures. AFTER THE PROCEDURE You may return to your normal schedule including diet, activities, and medicines, unless your health care provider tells you otherwise.   This information is not intended to replace advice given to you by your health care provider. Make sure you discuss any questions you have with your health care provider.   Document Released: 08/07/2000 Document Revised: 08/31/2014 Document Reviewed: 04/17/2013 Elsevier Interactive Patient Education 2016 Elsevier Inc.   Pharmacologic Stress Electrocardiogram A pharmacologic stress electrocardiogram is a heart (cardiac) test that uses nuclear imaging to evaluate the blood supply to your heart. This test may also  be called a pharmacologic stress electrocardiography. Pharmacologic means that a medicine is used to increase your heart rate and blood pressure.  This stress test is done to find areas of poor blood flow to the heart by determining the extent of coronary artery disease (CAD). Some people exercise on a treadmill, which naturally increases the blood flow to the heart. For those people unable to exercise on a treadmill, a medicine is used. This medicine stimulates your heart and will cause your heart to beat harder and more quickly, as if you were exercising.  Pharmacologic stress tests can help  determine:  The adequacy of blood flow to your heart during increased levels of activity in order to clear you for discharge home.  The extent of coronary artery blockage caused by CAD.  Your prognosis if you have suffered a heart attack.  The effectiveness of cardiac procedures done, such as an angioplasty, which can increase the circulation in your coronary arteries.  Causes of chest pain or pressure. LET [facility] CARE PROVIDER KNOW ABOUT:  Any allergies you have.  All medicines you are taking, including vitamins, herbs, eye drops, creams, and over-the-counter medicines.  Previous problems you or members of your family have had with the use of anesthetics.  Any blood disorders you have.  Previous surgeries you have had.  Medical conditions you have.  Possibility of pregnancy, if this applies.  If you are currently breastfeeding. RISKS AND COMPLICATIONS Generally, this is a safe procedure. However, as with any procedure, complications can occur. Possible complications include:  You develop pain or pressure in the following areas:  Chest.  Jaw or neck.  Between your shoulder blades.  Radiating down your left arm.  Headache.  Dizziness or light-headedness.  Shortness of breath.  Increased or irregular heartbeat.  Low blood pressure.  Nausea or vomiting.  Flushing.  Redness going up the arm and slight pain during injection of medicine.  Heart attack (rare). BEFORE THE PROCEDURE   Avoid all forms of caffeine for 24 hours before your test or as directed by your health care provider. This includes coffee, tea (even decaffeinated tea), caffeinated sodas, chocolate, cocoa, and certain pain medicines.  Follow your health care provider's instructions regarding eating and drinking before the test.  Take your medicines as directed at regular times with water unless instructed otherwise. Exceptions may include:  If you have diabetes, ask how you are to take  your insulin or pills. It is common to adjust insulin dosing the morning of the test.  If you are taking beta-blocker medicines, it is important to talk to your health care provider about these medicines well before the date of your test. Taking beta-blocker medicines may interfere with the test. In some cases, these medicines need to be changed or stopped 24 hours or more before the test.  If you wear a nitroglycerin patch, it may need to be removed prior to the test. Ask your health care provider if the patch should be removed before the test.  If you use an inhaler for any breathing condition, bring it with you to the test.  If you are an outpatient, bring a snack so you can eat right after the stress phase of the test.  Do not smoke for 4 hours prior to the test or as directed by your health care provider.  Do not apply lotions, powders, creams, or oils on your chest prior to the test.  Wear comfortable shoes and clothing. Let your health care provider  know if you were unable to complete or follow the preparations for your test. PROCEDURE   Multiple patches (electrodes) will be put on your chest. If needed, small areas of your chest may be shaved to get better contact with the electrodes. Once the electrodes are attached to your body, multiple wires will be attached to the electrodes, and your heart rate will be monitored.  An IV access will be started. A nuclear trace (isotope) is given. The isotope may be given intravenously, or it may be swallowed. Nuclear refers to several types of radioactive isotopes, and the nuclear isotope lights up the arteries so that the nuclear images are clear. The isotope is absorbed by your body. This results in low radiation exposure.  A resting nuclear image is taken to show how your heart functions at rest.  A medicine is given through the IV access.  A second scan is done about 1 hour after the medicine injection and determines how your heart  functions under stress.  During this stress phase, you will be connected to an electrocardiogram machine. Your blood pressure and oxygen levels will be monitored. AFTER THE PROCEDURE   Your heart rate and blood pressure will be monitored after the test.  You may return to your normal schedule, including diet,activities, and medicines, unless your health care provider tells you otherwise.   This information is not intended to replace advice given to you by your health care provider. Make sure you discuss any questions you have with your health care provider.   Document Released: 12/27/2008 Document Revised: 08/15/2013 Document Reviewed: 04/17/2013 Elsevier Interactive Patient Education [employer]

## 2016-01-16 NOTE — Progress Notes (Signed)
Cardiology Office Note   Date:  01/16/2016   ID:  Tammy Acosta, DOB 04-May-1956, MRN [id number]  Referring Doctor:  Tommi Rumps, MD   Cardiologist:   Wende Bushy, MD   Reason for consultation:  Chief Complaint  Patient presents with  . other    Ref by Dr. Caryl Bis for abnormal EKG. Meds reviewed by the patient verbally. Pt. c/o chest pain, shortness of breath and fatigue.       History of Present Illness: Tammy Acosta is a 60 y.o. female who presents forChest pain. This has been going on for 2 months now. She describes it as sharp pain eventually becoming heavy, 4 out of 10 severity. Lasting 30 minutes but sometimes goes on for a couple of hours. She usually just waits it out. Chest pain is in the center of the chest, occasionally radiating up to the neck into the back. Mostly associated with emotional stress. She also notes shortness of breath with exertion. Also sometimes, she will note chest pain with exertion.  No palpitations, no loss of consciousness. No fever, cough, colds, abdominal pain. No orthopnea, PND, edema.   ROS:  Please see the history of present illness. Aside from mentioned under HPI, all other systems are reviewed and negative.     Past Medical History  Diagnosis Date  . Diabetes mellitus without complication ([place])   . Hypertension   . Hyperlipidemia   . Hypercholesteremia   . Spine disorder   . Arthritis   . Depression   . Colon polyps     Past Surgical History  Procedure Laterality Date  . Back surgery    . Abdominal hysterectomy    . Cholecystectomy    . Kyphoplasty       reports that she has been smoking Cigarettes.  She has a 10 pack-year smoking history. She does not have any smokeless tobacco history on file. She reports that she does not drink alcohol or use illicit drugs.   family history includes Heart disease in her father and mother; Hypertension in her mother. Onset of heart disease in father in his 19s.  Grandfather in his 7s, grandmother in her 34s.  Current Outpatient Prescriptions  Medication Sig Dispense Refill  . acetaminophen (RA ACETAMINOPHEN) 650 MG CR tablet Take 1,300 mg by mouth daily.     . blood glucose meter kit and supplies Dispense One touch meter.  E11.9 1 each 0  . Blood Glucose Monitoring Suppl (ONE TOUCH ULTRA 2) w/Device KIT     . cloNIDine (CATAPRES) 0.3 MG tablet Take 0.3 mg by mouth 2 (two) times daily.    Marland Kitchen gabapentin (NEURONTIN) 800 MG tablet Take 1 tablet (800 mg total) by mouth every 6 (six) hours. 120 tablet 2  . glipiZIDE (GLUCOTROL) 5 MG tablet Take 1 tablet (5 mg total) by mouth daily before breakfast. 30 tablet 3  . hydrochlorothiazide (HYDRODIURIL) 25 MG tablet Take 1 tablet (25 mg total) by mouth daily. 90 tablet 3  . lisinopril (PRINIVIL,ZESTRIL) 40 MG tablet Take 1 tablet (40 mg total) by mouth daily. 90 tablet 3  . Magnesium Oxide 500 MG CAPS Take 1 capsule (500 mg total) by mouth daily. 100 capsule PRN  . metFORMIN (GLUCOPHAGE) 1000 MG tablet Take 1,000 mg by mouth 2 (two) times daily with a meal.    . morphine (MS CONTIN) 15 MG 12 hr tablet Take 1 tablet (15 mg total) by mouth every 12 (twelve) hours. 60 tablet 0  . sertraline (  ZOLOFT) 50 MG tablet Please take 25 mg (1/2 tablet) by mouth daily for 7 days, then 50 mg (1 tablet) by mouth daily 30 tablet 3  . simvastatin (ZOCOR) 40 MG tablet Take 1 tablet (40 mg total) by mouth every evening. 90 tablet 1  . tiZANidine (ZANAFLEX) 4 MG tablet Take 4 mg by mouth 3 (three) times daily.    Marland Kitchen aspirin EC 81 MG tablet Take 1 tablet (81 mg total) by mouth daily. 90 tablet 3  . nitroGLYCERIN (NITROSTAT) 0.4 MG SL tablet Place 1 tablet (0.4 mg total) under the tongue every 5 (five) minutes as needed for chest pain. 25 tablet 6   No current facility-administered medications for this visit.    Allergies: Penicillins and Tetracyclines & related    PHYSICAL EXAM: VS:  BP 140/88 mmHg  Pulse 79  Ht 5' 3"  (1.6 m)   Wt 162 lb 4 oz (73.596 kg)  BMI 28.75 kg/m2  SpO2 97% , Body mass index is 28.75 kg/(m^2). Wt Readings from Last 3 Encounters:  01/16/16 162 lb 4 oz (73.596 kg)  01/15/16 162 lb (73.483 kg)  01/10/16 161 lb 3.2 oz (73.12 kg)    GENERAL:  well developed, well nourished, not in acute distress HEENT: normocephalic, pink conjunctivae, anicteric sclerae, no xanthelasma, normal dentition, oropharynx clear NECK:  no neck vein engorgement, JVP normal, no hepatojugular reflux, carotid upstroke brisk and symmetric, no bruit, no thyromegaly, no lymphadenopathy LUNGS:  good respiratory effort, clear to auscultation bilaterally CV:  PMI not displaced, no thrills, no lifts, S1 and S2 within normal limits, no palpable S3 or S4, no murmurs, no rubs, no gallops ABD:  Soft, nontender, nondistended, normoactive bowel sounds, no abdominal aortic bruit, no hepatomegaly, no splenomegaly MS: nontender back, no kyphosis, no scoliosis, no joint deformities EXT:  2+ DP/PT pulses, no edema, no varicosities, no cyanosis, no clubbing SKIN: warm, nondiaphoretic, normal turgor, no ulcers NEUROPSYCH: alert, oriented to person, place, and time, sensory/motor grossly intact, normal mood, appropriate affect  Recent Labs: 08/05/2015: Hemoglobin 13.5; Platelets 326 10/21/2015: Magnesium 1.5* 01/02/2016: ALT 11; BUN 22; Creatinine, Ser 1.13; Potassium 3.9; Sodium 138   Lipid Panel No results found for: CHOL, TRIG, HDL, CHOLHDL, VLDL, LDLCALC, LDLDIRECT   Other studies Reviewed:  EKG:  The ekg from 01/16/2016 was personally reviewed by me and it revealed sinus rhythm, 78 BPM  Additional studies/ records that were reviewed personally reviewed by me today include: None available   ASSESSMENT AND PLAN:  Chest pain, possible angina Shortness of breath Risk factors for CAD are multiple: Age, postmenopausal state, diabetes, hypertension, smoking history, family history. Recommend further evaluation with pharmacologic  nuclear stress test, echocardiogram Patient prescribed ASA, and NTG SL prn for chest pain. Patient instructed to call [phone] for unrelenting chest pain.  Hypertension PCP is monitoring this. Advised blood pressure log.  Tobacco use We discussed the importance of smoking cessation and different strategies for quitting.    Current medicines are reviewed at length with the patient today.  The patient does not have concerns regarding medicines.  Labs/ tests ordered today include:  Orders Placed This Encounter  Procedures  . NM Myocar Multi W/Spect W/Wall Motion / EF  . EKG 12-Lead  . ECHOCARDIOGRAM COMPLETE    I had a lengthy and detailed discussion with the patient regarding diagnoses, prognosis, diagnostic options, treatment options, and side effects of medications.   I counseled the patient on importance of lifestyle modification including heart healthy diet, regular physical activity ,  and smoking cessation.   Disposition:   FU with undersigned after tests   Signed, Wende Bushy, MD  01/16/2016 10:06 AM    [place]

## 2016-01-21 ENCOUNTER — Telehealth: Payer: Self-pay | Admitting: Cardiology

## 2016-01-21 NOTE — Telephone Encounter (Signed)
Spoke with patient and reviewed instructions for stress test tomorrow. Patient verbalized understanding of instructions and had no further questions.

## 2016-01-22 ENCOUNTER — Encounter
Admission: RE | Admit: 2016-01-22 | Discharge: 2016-01-22 | Disposition: A | Discharge status: Home / Self Care | Payer: Medicare Other | Source: Ambulatory Visit | Attending: Cardiology | Admitting: Cardiology

## 2016-01-22 DIAGNOSIS — R079 Chest pain, unspecified: Secondary | ICD-10-CM | POA: Diagnosis not present

## 2016-01-22 DIAGNOSIS — R0602 Shortness of breath: Secondary | ICD-10-CM | POA: Diagnosis not present

## 2016-01-22 LAB — NM MYOCAR MULTI W/SPECT W/WALL MOTION / EF
CHL CUP NUCLEAR SSS: 0
CHL CUP RESTING HR STRESS: 60 {beats}/min
CSEPPHR: 100 {beats}/min
LV dias vol: 53 mL (ref 46–106)
LV sys vol: 23 mL
Percent HR: 62 %
SDS: 0
SRS: 0
TID: 0.95

## 2016-01-22 MED ORDER — TECHNETIUM TC 99M TETROFOSMIN IV KIT
30.0000 | PACK | Freq: Once | INTRAVENOUS | Status: AC | PRN
  Administered 2016-01-22: 32.748 via INTRAVENOUS

## 2016-01-22 MED ORDER — TECHNETIUM TC 99M TETROFOSMIN IV KIT
13.0000 | PACK | Freq: Once | INTRAVENOUS | Status: AC | PRN
  Administered 2016-01-22: 14.146 via INTRAVENOUS

## 2016-01-22 MED ORDER — REGADENOSON 0.4 MG/5ML IV SOLN
0.4000 mg | Freq: Once | INTRAVENOUS | Status: AC
  Administered 2016-01-22: 0.4 mg via INTRAVENOUS
  Filled 2016-01-22: qty 5

## 2016-01-23 ENCOUNTER — Telehealth: Payer: Self-pay | Admitting: Cardiology

## 2016-01-23 LAB — TOXASSURE SELECT 13 (MW), URINE

## 2016-01-23 NOTE — Telephone Encounter (Signed)
S/w pt who inquires as to whether she should keep echo appt as she had myoview yesterday. Explained each test to pt and advised her to keep June echo appt.  She understands we will call w/myoview results and is appreciative of the call w/no further questions.

## 2016-01-23 NOTE — Telephone Encounter (Signed)
Pt is calling stating she did a NM stress test yesterday Is asking if that test is good does she need to still do the echo Please advise.

## 2016-01-31 ENCOUNTER — Other Ambulatory Visit: Payer: Self-pay

## 2016-01-31 ENCOUNTER — Ambulatory Visit (INDEPENDENT_AMBULATORY_CARE_PROVIDER_SITE_OTHER): Payer: Medicare Other

## 2016-01-31 DIAGNOSIS — R079 Chest pain, unspecified: Secondary | ICD-10-CM | POA: Diagnosis not present

## 2016-01-31 DIAGNOSIS — R0602 Shortness of breath: Secondary | ICD-10-CM | POA: Diagnosis not present

## 2016-01-31 LAB — ECHOCARDIOGRAM COMPLETE
Ao-asc: 23 cm
E decel time: 292 msec
EERAT: 9.26
FS: 38 % (ref 28–44)
IVS/LV PW RATIO, ED: 1.07
LA ID, A-P, ES: 36 mm
LA diam end sys: 36 mm
LA vol A4C: 34 ml
LA vol index: 27.6 mL/m2
LADIAMINDEX: 1.97 cm/m2
LAVOL: 50.5 mL
LV PW d: 10.7 mm — AB (ref 0.6–1.1)
LV SIMPSON'S DISK: 52
LV TDI E'LATERAL: 7.18
LV TDI E'MEDIAL: 6.31
LV dias vol: 81 mL (ref 46–106)
LV sys vol index: 21 mL/m2
LVDIAVOLIN: 44 mL/m2
LVEEAVG: 9.26
LVEEMED: 9.26
LVELAT: 7.18 cm/s
LVOT VTI: 18.5 cm
LVOT area: 3.46 cm2
LVOT peak vel: 106 cm/s
LVOTD: 21 mm
LVOTSV: 64 mL
LVSYSVOL: 39 mL (ref 14–42)
MV Dec: 292
MVPKAVEL: 104 m/s
MVPKEVEL: 66.5 m/s
Stroke v: 42 ml
TAPSE: 16.1 mm

## 2016-02-03 ENCOUNTER — Telehealth: Payer: Self-pay | Admitting: Family Medicine

## 2016-02-03 MED ORDER — GLIPIZIDE 5 MG PO TABS: 5.0000 mg | ORAL_TABLET | Freq: Every day | ORAL | Status: DC

## 2016-02-03 NOTE — Telephone Encounter (Signed)
Pt called about needing a refill for glipiZIDE (GLUCOTROL) 5 MG tablet.   [facility] [address] [facility] [place] [id number] Thank you!  Call pt @ [phone].

## 2016-02-13 ENCOUNTER — Ambulatory Visit (INDEPENDENT_AMBULATORY_CARE_PROVIDER_SITE_OTHER): Payer: Medicare Other | Admitting: Family Medicine

## 2016-02-13 ENCOUNTER — Ambulatory Visit: Payer: Medicare Other | Admitting: Cardiology

## 2016-02-13 ENCOUNTER — Encounter: Payer: Self-pay | Admitting: Family Medicine

## 2016-02-13 VITALS — BP 123/79 | HR 92 | Temp 98.4°F | Wt 163.0 lb

## 2016-02-13 DIAGNOSIS — F419 Anxiety disorder, unspecified: Secondary | ICD-10-CM

## 2016-02-13 DIAGNOSIS — E119 Type 2 diabetes mellitus without complications: Secondary | ICD-10-CM

## 2016-02-13 DIAGNOSIS — R079 Chest pain, unspecified: Secondary | ICD-10-CM | POA: Diagnosis not present

## 2016-02-13 DIAGNOSIS — F418 Other specified anxiety disorders: Secondary | ICD-10-CM | POA: Diagnosis not present

## 2016-02-13 DIAGNOSIS — F32A Depression, unspecified: Secondary | ICD-10-CM

## 2016-02-13 DIAGNOSIS — F329 Major depressive disorder, single episode, unspecified: Secondary | ICD-10-CM

## 2016-02-13 MED ORDER — SERTRALINE HCL 100 MG PO TABS: 100.0000 mg | ORAL_TABLET | Freq: Every day | ORAL | Status: DC

## 2016-02-13 NOTE — Assessment & Plan Note (Signed)
Blood glucose seems to be at goal. We'll check an A1c today. Continue current medications.

## 2016-02-13 NOTE — Progress Notes (Signed)
Patient ID: Tammy Acosta Acosta, female   DOB: [birth year], 60 y.o.   MRN: [id number]  Marikay AlarEric Sonnenberg, MD Phone: [phone]  Tammy Acosta is a 60 y.o. female who presents today for f/u.  Depression/anxiety: Patient notes this is significantly better. Taking Zoloft and is interested in going up 200 mg. Doesn't get as down as she used to though does sometimes get blue. She is staying busy. No SI. Has not seen a Veterinary surgeoncounselor.  She saw cardiology for chest pain. Found to have a low risk stress test. Notes still intermittent chest discomfort mostly when she feels anxious. No shortness of breath or diaphoresis. Discomfort is centrally located the results when she feels less anxious.  DIABETES Disease Monitoring: Blood Sugar ranges-less than 150 Polyuria/phagia/dipsia- no      Medications: Compliance- taking metformin and glipizide Hypoglycemic symptoms- does note getting a little shaky if she does not take her glipizide with food  PMH: Smoker, not ready to quit smoking   ROS see history of present illness  Objective  Physical Exam Filed Vitals:   02/13/16 1527  BP: 123/79  Pulse: 92  Temp: 98.4 F (36.9 C)    BP Readings from Last 3 Encounters:  02/13/16 123/79  01/16/16 140/88  01/15/16 156/69   Wt Readings from Last 3 Encounters:  02/13/16 163 lb (73.936 kg)  01/16/16 162 lb 4 oz (73.596 kg)  01/15/16 162 lb (73.483 kg)    Physical Exam  Constitutional: She is well-developed, well-nourished, and in no distress.  HENT:  Head: Normocephalic and atraumatic.  Right Ear: External ear normal.  Left Ear: External ear normal.  Cardiovascular: Normal rate, regular rhythm and normal heart sounds.   Pulmonary/Chest: Effort normal and breath sounds normal.  Neurological: She is alert. Gait normal.  Skin: Skin is warm and dry. She is not diaphoretic.  Psychiatric: Mood and affect normal.     Assessment/Plan: Please see individual problem list.  Chest pain Rare now.  Atypical. Suspect likely related to her anxiety. Hadn't low risk stress test and echo. Saw cardiology. Unlikely cardiac in nature. We'll continue to treat her anxiety. Given return precautions.  Anxiety and depression Significantly improved. We'll increase Zoloft to 100 mg daily. She will take two 50 mg tablets until she runs out of her current prescription. She'll continue to monitor. Given return precautions.  Type 2 diabetes mellitus (HCC) Blood glucose seems to be at goal. We'll check an A1c today. Continue current medications.    Orders Placed This Encounter  Procedures  . HgB A1c    Meds ordered this encounter  Medications  . sertraline (ZOLOFT) 100 MG tablet    Sig: Take 1 tablet (100 mg total) by mouth daily.    Dispense:  90 tablet    Refill:  3    Marikay AlarEric Sonnenberg, MD [facility] [place]

## 2016-02-13 NOTE — Assessment & Plan Note (Signed)
Rare now. Atypical. Suspect likely related to her anxiety. Hadn't low risk stress test and echo. Saw cardiology. Unlikely cardiac in nature. We'll continue to treat her anxiety. Given return precautions.

## 2016-02-13 NOTE — Patient Instructions (Signed)
Nice to see you. I am glad you are doing better. We will increase her Zoloft to 100 mg daily. We will check an A1c and call you with the results. If you develop thoughts of harming herself or others, or persistent chest pain please seek medical attention.

## 2016-02-13 NOTE — Assessment & Plan Note (Signed)
Significantly improved. We'll increase Zoloft to 100 mg daily. She will take two 50 mg tablets until she runs out of her current prescription. She'll continue to monitor. Given return precautions.

## 2016-02-14 LAB — HEMOGLOBIN A1C: HEMOGLOBIN A1C: 7.1 % — AB (ref 4.6–6.5)

## 2016-02-24 ENCOUNTER — Telehealth: Payer: Self-pay | Admitting: Family Medicine

## 2016-02-24 MED ORDER — METFORMIN HCL 1000 MG PO TABS: 1000.0000 mg | ORAL_TABLET | Freq: Two times a day (BID) | ORAL | Status: DC

## 2016-02-24 NOTE — Telephone Encounter (Signed)
Pt called needing a refill for metFORMIN (GLUCOPHAGE) 1000 MG tablet  [facility] [address] [place] [facility] [id number]  Call pt @ [phone]. Thank you!

## 2016-02-24 NOTE — Telephone Encounter (Signed)
Spoke with patient to confirm dose, refilled for 3 months. thanks

## 2016-03-30 ENCOUNTER — Ambulatory Visit: Payer: Medicare Other | Attending: Pain Medicine | Admitting: Pain Medicine

## 2016-03-30 ENCOUNTER — Encounter: Payer: Self-pay | Admitting: Pain Medicine

## 2016-03-30 VITALS — BP 143/58 | HR 73 | Temp 98.5°F | Ht 63.0 in | Wt 156.0 lb

## 2016-03-30 DIAGNOSIS — M791 Myalgia: Secondary | ICD-10-CM

## 2016-03-30 DIAGNOSIS — R200 Anesthesia of skin: Secondary | ICD-10-CM | POA: Diagnosis not present

## 2016-03-30 DIAGNOSIS — M79606 Pain in leg, unspecified: Secondary | ICD-10-CM

## 2016-03-30 DIAGNOSIS — M79605 Pain in left leg: Secondary | ICD-10-CM | POA: Insufficient documentation

## 2016-03-30 DIAGNOSIS — E1140 Type 2 diabetes mellitus with diabetic neuropathy, unspecified: Secondary | ICD-10-CM | POA: Insufficient documentation

## 2016-03-30 DIAGNOSIS — F329 Major depressive disorder, single episode, unspecified: Secondary | ICD-10-CM | POA: Insufficient documentation

## 2016-03-30 DIAGNOSIS — M545 Low back pain: Secondary | ICD-10-CM | POA: Diagnosis not present

## 2016-03-30 DIAGNOSIS — F419 Anxiety disorder, unspecified: Secondary | ICD-10-CM | POA: Diagnosis not present

## 2016-03-30 DIAGNOSIS — J069 Acute upper respiratory infection, unspecified: Secondary | ICD-10-CM | POA: Insufficient documentation

## 2016-03-30 DIAGNOSIS — Z7984 Long term (current) use of oral hypoglycemic drugs: Secondary | ICD-10-CM | POA: Insufficient documentation

## 2016-03-30 DIAGNOSIS — Z79891 Long term (current) use of opiate analgesic: Secondary | ICD-10-CM

## 2016-03-30 DIAGNOSIS — M79604 Pain in right leg: Secondary | ICD-10-CM | POA: Diagnosis not present

## 2016-03-30 DIAGNOSIS — M7918 Myalgia, other site: Secondary | ICD-10-CM

## 2016-03-30 DIAGNOSIS — I10 Essential (primary) hypertension: Secondary | ICD-10-CM | POA: Diagnosis not present

## 2016-03-30 DIAGNOSIS — Z7982 Long term (current) use of aspirin: Secondary | ICD-10-CM | POA: Diagnosis not present

## 2016-03-30 DIAGNOSIS — R4182 Altered mental status, unspecified: Secondary | ICD-10-CM | POA: Insufficient documentation

## 2016-03-30 DIAGNOSIS — F17210 Nicotine dependence, cigarettes, uncomplicated: Secondary | ICD-10-CM | POA: Diagnosis not present

## 2016-03-30 DIAGNOSIS — Z90710 Acquired absence of both cervix and uterus: Secondary | ICD-10-CM | POA: Insufficient documentation

## 2016-03-30 DIAGNOSIS — F1190 Opioid use, unspecified, uncomplicated: Secondary | ICD-10-CM | POA: Diagnosis not present

## 2016-03-30 DIAGNOSIS — E785 Hyperlipidemia, unspecified: Secondary | ICD-10-CM | POA: Diagnosis not present

## 2016-03-30 DIAGNOSIS — M160 Bilateral primary osteoarthritis of hip: Secondary | ICD-10-CM

## 2016-03-30 DIAGNOSIS — M792 Neuralgia and neuritis, unspecified: Secondary | ICD-10-CM

## 2016-03-30 DIAGNOSIS — M5416 Radiculopathy, lumbar region: Secondary | ICD-10-CM | POA: Diagnosis not present

## 2016-03-30 DIAGNOSIS — G8929 Other chronic pain: Secondary | ICD-10-CM

## 2016-03-30 DIAGNOSIS — Z9049 Acquired absence of other specified parts of digestive tract: Secondary | ICD-10-CM | POA: Insufficient documentation

## 2016-03-30 MED ORDER — MORPHINE SULFATE ER 15 MG PO TBCR: 15.0000 mg | EXTENDED_RELEASE_TABLET | Freq: Two times a day (BID) | ORAL | 0 refills | Status: DC

## 2016-03-30 MED ORDER — GABAPENTIN 800 MG PO TABS: 800.0000 mg | ORAL_TABLET | Freq: Four times a day (QID) | ORAL | 2 refills | Status: DC

## 2016-03-30 MED ORDER — TIZANIDINE HCL 4 MG PO TABS: 4.0000 mg | ORAL_TABLET | Freq: Three times a day (TID) | ORAL | 2 refills | Status: DC

## 2016-03-30 NOTE — Progress Notes (Signed)
Pill Count Morphine 15 mg 56/60; filled 03/28/16

## 2016-03-30 NOTE — Progress Notes (Signed)
Patient's Name: Tammy Acosta  Patient type: Established  MRN: [id number]  Service setting: Ambulatory outpatient  DOB: 03/03/1956  Location: [facility]  DOS: 03/30/2016  Primary Care Physician: Tommi Rumps, MD  Note by: Kathlen Brunswick. Dossie Arbour, M.D  Referring Physician: Leone Haven, MD  Specialty: Interventional Pain Management  Last Visit to Pain Management: 01/15/2016   Primary Reason(s) for Visit: Encounter for prescription drug management (Level of risk: moderate) CC: Back Pain (lower)   HPI  Tammy Acosta is a 60 y.o. year old, female patient, who returns today as an established patient. She has Chronic pain; Failed back surgical syndrome; Chronic lumbar radicular pain and numbness (Location of Secondary source of pain) (Bilateral) (R>L); Opiate use (30 MME/Day); Opioid dependence ([place]); Long term prescription opiate use; Long term current use of opiate analgesic; Encounter for therapeutic drug level monitoring; Chronic low back pain (Location of Primary Source of Pain) (Bilateral) (L>R); Chronic pain syndrome; Anxiety and depression; Type 2 diabetes mellitus ([place]); Essential (primary) hypertension; Hyperlipidemia; Encounter for chronic pain management; Neurogenic pain; Neuropathic pain; Musculoskeletal pain; Chronic pain of lower extremity (Location of Secondary source of pain) (Bilateral) (R>L); Chronic hip pain (Location of Tertiary source of pain) (Bilateral) (L>R); Osteoarthritis of hip (Location of Tertiary source of pain) (Bilateral) (L>R); Diabetic peripheral neuropathy ([place]); Hypomagnesemia; Tobacco abuse; Viral upper respiratory illness; Chest pain; Altered mental status; Chronic back pain; and Depression on her problem list.. Her primarily concern today is the Back Pain (lower)   Pain Assessment: Self-Reported Pain Score: 4              Reported level is compatible with observation       Pain Type: Chronic pain Pain Location: Back Pain Orientation:  Lower Pain Descriptors / Indicators: Aching, Burning, Constant Pain Frequency: Constant  The patient comes into the clinics today for pharmacological management of her chronic pain. I last saw this patient on 01/15/2016. The patient  reports that she does not use drugs. Her body mass index is 27.63 kg/m.  Date of Last Visit: 01/15/16 Service Provided on Last Visit: Med Refill  Controlled Substance Pharmacotherapy Assessment & REMS (Risk Evaluation and Mitigation Strategy)  Analgesic: MS Contin (morphine ER) 15 mg every 12 hours (30 mg/day) MME/day: 30 mg/day Pill Count: Morphine 15 mg 56/60; filled 03/28/16. Pharmacokinetics: Onset of action (Liberation/Absorption): Within expected pharmacological parameters Time to Peak effect (Distribution): Timing and results are as within normal expected parameters Duration of action (Metabolism/Excretion): Within normal limits for medication Pharmacodynamics: Analgesic Effect: More than 50% Activity Facilitation: Medication(s) allow patient to sit, stand, walk, and do the basic ADLs Perceived Effectiveness: Described as relatively effective, allowing for increase in activities of daily living (ADL) Side-effects or Adverse reactions: None reported Monitoring: [place] PMP: Online review of the past 4-monthperiod conducted. Compliant with practice rules and regulations Last UDS on record: ToxAssure Select 13  Date Value Ref Range Status  01/15/2016 FINAL  Final    Comment:    ==================================================================== TOXASSURE SELECT 13 (MW) ==================================================================== Test                             Result       Flag       Units Drug Present and Declared for Prescription Verification   Morphine                       1784  EXPECTED   ng/mg creat    Potential sources of large amounts of morphine in the absence of    codeine include administration of morphine or use of  heroin. ==================================================================== Test                      Result    Flag   Units      Ref Range   Creatinine              151              mg/dL      >=37 ==================================================================== Declared Medications:  The flagging and interpretation on this report are based on the  following declared medications.  Unexpected results may arise from  inaccuracies in the declared medications.  **Note: The testing scope of this panel includes these medications:  Morphine (MS Contin)  **Note: The testing scope of this panel does not include following  reported medications:  Acetaminophen (Tylenol)  Clonidine (Catapres)  Gabapentin (Neurontin)  Glipizide  Hydrochlorothiazide (HCTZ)  Lisinopril  Magnesium Oxide  Metformin  Sertraline (Zoloft)  Simvastatin (Zocor)  Tizanidine (Zanaflex) ==================================================================== For clinical consultation, please call [phone]. ====================================================================    UDS interpretation: Compliant          Medication Assessment Form: Reviewed. Patient indicates being compliant with therapy Treatment compliance: Compliant Risk Assessment: Aberrant Behavior: None observed today Substance Use Disorder (SUD) Risk Level: No change since last visit Risk of opioid abuse or dependence: 0.7-3.0% with doses ? 36 MME/day and 6.1-26% with doses ? 120 MME/day. Opioid Risk Tool (ORT) Score: Total Score: 1    Depression Scale Score: PHQ-2: PHQ-2 Total Score: 0       PHQ-9: PHQ-9 Total Score: 0        Pharmacologic Plan: No change in therapy, at this time  Laboratory Chemistry  Inflammation Markers Lab Results  Component Value Date   ESRSEDRATE 18 10/21/2015   CRP 1.2 (H) 10/21/2015    Renal Function Lab Results  Component Value Date   BUN 22 01/02/2016   CREATININE 1.13 01/02/2016   GFRAA >60  10/21/2015   GFRNONAA 57 (L) 10/21/2015    Hepatic Function Lab Results  Component Value Date   AST 14 01/02/2016   ALT 11 01/02/2016   ALBUMIN 4.3 01/02/2016    Electrolytes Lab Results  Component Value Date   NA 138 01/02/2016   K 3.9 01/02/2016   CL 101 01/02/2016   CALCIUM 9.2 01/02/2016   MG 1.5 (L) 10/21/2015    Pain Modulating Vitamins No results found for: Jerry Caras [id number], MA7637RQ4, ZY4250XR9, 25OHVITD1, 25OHVITD2, 25OHVITD3, VITAMINB12  Coagulation Parameters Lab Results  Component Value Date   PLT 326 08/05/2015    Cardiovascular Lab Results  Component Value Date   HGB 13.5 08/05/2015   HCT 40.4 08/05/2015    Note: Lab results reviewed.  Recent Diagnostic Imaging  Nm Myocar Multi W/spect W/wall Motion / Ef  Result Date: 01/22/2016 Pharmacological myocardial perfusion imaging study with no significant  ischemia Normal wall motion, EF estimated at 76% No EKG changes concerning for ischemia at peak stress or in recovery. Low risk scan Signed, Dossie Arbour, MD, Ph.D [facility]    Meds  The patient has a current medication list which includes the following prescription(s): acetaminophen, aspirin ec, blood glucose meter kit and supplies, one touch ultra 2, clonidine, gabapentin, glipizide, hydrochlorothiazide, lisinopril, magnesium oxide, metformin, morphine, morphine, morphine, nitroglycerin, sertraline, simvastatin, and tizanidine.  Current Outpatient  Prescriptions on File Prior to Visit  Medication Sig  . acetaminophen (RA ACETAMINOPHEN) 650 MG CR tablet Take 1,300 mg by mouth daily.   Marland Kitchen aspirin EC 81 MG tablet Take 1 tablet (81 mg total) by mouth daily.  . blood glucose meter kit and supplies Dispense One touch meter.  E11.9  . Blood Glucose Monitoring Suppl (ONE TOUCH ULTRA 2) w/Device KIT   . cloNIDine (CATAPRES) 0.3 MG tablet Take 0.3 mg by mouth 2 (two) times daily.  Marland Kitchen glipiZIDE (GLUCOTROL) 5 MG tablet Take 1 tablet (5 mg total) by mouth  daily before breakfast.  . hydrochlorothiazide (HYDRODIURIL) 25 MG tablet Take 1 tablet (25 mg total) by mouth daily.  Marland Kitchen lisinopril (PRINIVIL,ZESTRIL) 40 MG tablet Take 1 tablet (40 mg total) by mouth daily.  . Magnesium Oxide 500 MG CAPS Take 1 capsule (500 mg total) by mouth daily.  . metFORMIN (GLUCOPHAGE) 1000 MG tablet Take 1 tablet (1,000 mg total) by mouth 2 (two) times daily with a meal.  . nitroGLYCERIN (NITROSTAT) 0.4 MG SL tablet Place 1 tablet (0.4 mg total) under the tongue every 5 (five) minutes as needed for chest pain.  Marland Kitchen sertraline (ZOLOFT) 100 MG tablet Take 1 tablet (100 mg total) by mouth daily.  . simvastatin (ZOCOR) 40 MG tablet Take 1 tablet (40 mg total) by mouth every evening.   No current facility-administered medications on file prior to visit.     ROS  Constitutional: Denies any fever or chills Gastrointestinal: No reported hemesis, hematochezia, vomiting, or acute GI distress Musculoskeletal: Denies any acute onset joint swelling, redness, loss of ROM, or weakness Neurological: No reported episodes of acute onset apraxia, aphasia, dysarthria, agnosia, amnesia, paralysis, loss of coordination, or loss of consciousness  Allergies  Ms. Burciaga is allergic to penicillins and tetracyclines & related.  [place]  Medical:  Ms. Vasbinder  has a past medical history of Arthritis; Colon polyps; Depression; Diabetes mellitus without complication ([place]); Hypercholesteremia; Hyperlipidemia; Hypertension; and Spine disorder. Family: family history includes Heart disease in her father and mother; Hypertension in her mother. Surgical:  has a past surgical history that includes Back surgery; Abdominal hysterectomy; Cholecystectomy; and Kyphoplasty. Tobacco:  reports that she has been smoking Cigarettes.  She has a 10.00 pack-year smoking history. She does not have any smokeless tobacco history on file. Alcohol:  reports that she does not drink alcohol. Drug:  reports that she does  not use drugs.  Constitutional Exam  Vitals: Blood pressure (!) 143/58, pulse 73, temperature 98.5 F (36.9 C), temperature source Oral, height 5' 3"  (1.6 m), weight 156 lb (70.8 kg), SpO2 95 %. General appearance: Well nourished, well developed, and well hydrated. In no acute distress Calculated BMI/Body habitus: Body mass index is 27.63 kg/m.       Psych/Mental status: Alert and oriented x 3 (person, place, & time) Eyes: PERLA Respiratory: No evidence of acute respiratory distress  Cervical Spine Exam  Inspection: No masses, redness, or swelling Alignment: Symmetrical Functional ROM: ROM appears unrestricted Stability: No instability detected Muscle strength & Tone: Functionally intact Sensory: Unimpaired Palpation: Non-contributory  Upper Extremity (UE) Exam    Side: Right upper extremity  Side: Left upper extremity  Inspection: No masses, redness, swelling, or asymmetry  Inspection: No masses, redness, swelling, or asymmetry  Functional ROM: ROM appears unrestricted  Functional ROM: ROM appears unrestricted  Muscle strength & Tone: Functionally intact  Muscle strength & Tone: Functionally intact  Sensory: Unimpaired  Sensory: Unimpaired  Palpation: Non-contributory  Palpation: Non-contributory  Thoracic Spine Exam  Inspection: No masses, redness, or swelling Alignment: Symmetrical Functional ROM: ROM appears unrestricted Stability: No instability detected Sensory: Unimpaired Muscle strength & Tone: Functionally intact Palpation: Non-contributory  Lumbar Spine Exam  Inspection: No masses, redness, or swelling Alignment: Symmetrical Functional ROM: ROM appears unrestricted Stability: No instability detected Muscle strength & Tone: Functionally intact Sensory: Unimpaired Palpation: Non-contributory Provocative Tests: Lumbar Hyperextension and rotation test: evaluation deferred today       Patrick's Maneuver: evaluation deferred today              Gait & Posture  Assessment  Ambulation: Unassisted Gait: Relatively normal for age and body habitus Posture: WNL   Lower Extremity Exam    Side: Right lower extremity  Side: Left lower extremity  Inspection: No masses, redness, swelling, or asymmetry  Inspection: No masses, redness, swelling, or asymmetry  Functional ROM: ROM appears unrestricted  Functional ROM: ROM appears unrestricted  Muscle strength & Tone: Functionally intact  Muscle strength & Tone: Functionally intact  Sensory: Unimpaired  Sensory: Unimpaired  Palpation: Non-contributory  Palpation: Non-contributory    Assessment & Plan  Primary Diagnosis & Pertinent Problem List: The primary encounter diagnosis was Chronic pain. Diagnoses of Long term current use of opiate analgesic, Opiate use (30 MME/Day), Chronic low back pain (Location of Primary Source of Pain) (Bilateral) (L>R), Chronic pain of lower extremity, unspecified laterality, Primary osteoarthritis of both hips, Neurogenic pain, and Musculoskeletal pain were also pertinent to this visit.  Visit Diagnosis: 1. Chronic pain   2. Long term current use of opiate analgesic   3. Opiate use (30 MME/Day)   4. Chronic low back pain (Location of Primary Source of Pain) (Bilateral) (L>R)   5. Chronic pain of lower extremity, unspecified laterality   6. Primary osteoarthritis of both hips   7. Neurogenic pain   8. Musculoskeletal pain     Problems updated and reviewed during this visit: Problem  Hyperlipidemia  Chronic Back Pain  Altered Mental Status  Depression    Problem-specific Plan(s): No problem-specific Assessment & Plan notes found for this encounter.  No new Assessment & Plan notes have been filed under this hospital service since the last note was generated. Service: Pain Management   Plan of Care   Problem List Items Addressed This Visit      High   Chronic low back pain (Location of Primary Source of Pain) (Bilateral) (L>R) (Chronic)   Relevant Medications    morphine (MS CONTIN) 15 MG 12 hr tablet   morphine (MS CONTIN) 15 MG 12 hr tablet   morphine (MS CONTIN) 15 MG 12 hr tablet   tiZANidine (ZANAFLEX) 4 MG tablet   Chronic pain - Primary (Chronic)   Relevant Medications   morphine (MS CONTIN) 15 MG 12 hr tablet   morphine (MS CONTIN) 15 MG 12 hr tablet   morphine (MS CONTIN) 15 MG 12 hr tablet   gabapentin (NEURONTIN) 800 MG tablet   tiZANidine (ZANAFLEX) 4 MG tablet   Chronic pain of lower extremity (Location of Secondary source of pain) (Bilateral) (R>L) (Chronic)   Musculoskeletal pain (Chronic)   Relevant Medications   tiZANidine (ZANAFLEX) 4 MG tablet   Neurogenic pain (Chronic)   Relevant Medications   gabapentin (NEURONTIN) 800 MG tablet   Osteoarthritis of hip (Location of Tertiary source of pain) (Bilateral) (L>R) (Chronic)   Relevant Medications   morphine (MS CONTIN) 15 MG 12 hr tablet   morphine (MS CONTIN) 15 MG 12 hr tablet  morphine (MS CONTIN) 15 MG 12 hr tablet   tiZANidine (ZANAFLEX) 4 MG tablet     Medium   Long term current use of opiate analgesic (Chronic)   Opiate use (30 MME/Day) (Chronic)    Other Visit Diagnoses   None.      Pharmacotherapy (Medications Ordered): Meds ordered this encounter  Medications  . morphine (MS CONTIN) 15 MG 12 hr tablet    Sig: Take 1 tablet (15 mg total) by mouth every 12 (twelve) hours.    Dispense:  60 tablet    Refill:  0    Do not place this medication, or any other prescription from our practice, on "Automatic Refill". Patient may have prescription filled one day early if pharmacy is closed on scheduled refill date. Do not fill until: 04/18/16 To last until: 05/18/16  . morphine (MS CONTIN) 15 MG 12 hr tablet    Sig: Take 1 tablet (15 mg total) by mouth every 12 (twelve) hours.    Dispense:  60 tablet    Refill:  0    Do not place this medication, or any other prescription from our practice, on "Automatic Refill". Patient may have prescription filled one day early  if pharmacy is closed on scheduled refill date. Do not fill until: 05/18/16 To last until: 06/17/16  . morphine (MS CONTIN) 15 MG 12 hr tablet    Sig: Take 1 tablet (15 mg total) by mouth every 12 (twelve) hours.    Dispense:  60 tablet    Refill:  0    Do not place this medication, or any other prescription from our practice, on "Automatic Refill". Patient may have prescription filled one day early if pharmacy is closed on scheduled refill date. Do not fill until: 06/17/16 To last until: 07/17/16  . gabapentin (NEURONTIN) 800 MG tablet    Sig: Take 1 tablet (800 mg total) by mouth every 6 (six) hours.    Dispense:  120 tablet    Refill:  2    Do not place this medication, or any other prescription from our practice, on "Automatic Refill". Patient may have prescription filled one day early if pharmacy is closed on scheduled refill date.  Marland Kitchen tiZANidine (ZANAFLEX) 4 MG tablet    Sig: Take 1 tablet (4 mg total) by mouth 3 (three) times daily.    Dispense:  90 tablet    Refill:  2    Do not add this medication to the electronic "Automatic Refill" notification system. Patient may have prescription filled one day early if pharmacy is closed on scheduled refill date.    Lab-work & Procedure Ordered: No orders of the defined types were placed in this encounter.   Imaging Ordered: None  Interventional Therapies: Scheduled:  None at this point.    Considering:   1. Diagnostic bilateral intra-articular hip joint injection under fluoroscopic guidance, with or without sedation.  2. Bilateral diagnostic lumbar facet block under fluoroscopic guidance and IV sedation.  3. Right L4-5 lumbar epidural steroid injection under fluoroscopic guidance, without without sedation.  4. Palliative bilateral L5-S1 transforaminal epidural steroid injection under fluoroscopic guidance and IV sedation.  5. Palliative caudal epidural steroid injection under fluoroscopic guidance, with or without sedation.     PRN Procedures:   1. Diagnostic bilateral intra-articular hip joint injection under fluoroscopic guidance, with or without sedation.  2. Bilateral diagnostic lumbar facet block under fluoroscopic guidance and IV sedation.  3. Right L4-5 lumbar epidural steroid injection under fluoroscopic guidance, without without  sedation.  4. Palliative bilateral L5-S1 transforaminal epidural steroid injection under fluoroscopic guidance and IV sedation.  5. Palliative caudal epidural steroid injection under fluoroscopic guidance, with or without sedation.    Referral(s) or Consult(s): None at this time.  New Prescriptions   No medications on file    Medications administered during this visit: Ms. Tsuchiya had no medications administered during this visit.  Requested PM Follow-up: Return in 3 months (on 06/29/2016) for (3-Mo) Med-Mgmt, (PRN) Procedure.  Future Appointments Date Time Provider [place]  05/14/2016 2:30 PM Leone Haven, MD [facility] None  06/24/2016 1:00 PM Milinda Pointer, MD [facility] None    Primary Care Physician: Tommi Rumps, MD Location: [facility] Note by: Kathlen Brunswick. Dossie Arbour, M.D, DABA, DABAPM, DABPM, DABIPP, FIPP  Pain Score Disclaimer: We use the NRS-11 scale. This is a self-reported, subjective measurement of pain severity with only modest accuracy. It is used primarily to identify changes within a particular patient. It must be understood that outpatient pain scales are significantly less accurate that those used for research, where they can be applied under ideal controlled circumstances with minimal exposure to variables. In reality, the score is likely to be a combination of pain intensity and pain affect, where pain affect describes the degree of emotional arousal or changes in action readiness caused by the sensory experience of pain. Factors such as social and work situation, setting, emotional state, anxiety levels,  expectation, and prior pain experience may influence pain perception and show large inter-individual differences that may also be affected by time variables.  Patient instructions provided during this appointment: There are no Patient Instructions on file for this visit.

## 2016-05-12 ENCOUNTER — Other Ambulatory Visit: Payer: Self-pay | Admitting: Family Medicine

## 2016-05-14 ENCOUNTER — Ambulatory Visit: Payer: Medicare Other | Admitting: Family Medicine

## 2016-06-24 ENCOUNTER — Encounter: Payer: Self-pay | Admitting: Pain Medicine

## 2016-06-24 ENCOUNTER — Ambulatory Visit: Payer: Medicare Other | Attending: Pain Medicine | Admitting: Pain Medicine

## 2016-06-24 VITALS — BP 156/97 | HR 91 | Temp 98.6°F | Resp 16 | Ht 63.0 in | Wt 158.0 lb

## 2016-06-24 DIAGNOSIS — Z881 Allergy status to other antibiotic agents status: Secondary | ICD-10-CM | POA: Diagnosis not present

## 2016-06-24 DIAGNOSIS — I10 Essential (primary) hypertension: Secondary | ICD-10-CM | POA: Diagnosis not present

## 2016-06-24 DIAGNOSIS — G629 Polyneuropathy, unspecified: Secondary | ICD-10-CM | POA: Diagnosis not present

## 2016-06-24 DIAGNOSIS — Z7982 Long term (current) use of aspirin: Secondary | ICD-10-CM | POA: Insufficient documentation

## 2016-06-24 DIAGNOSIS — G894 Chronic pain syndrome: Secondary | ICD-10-CM | POA: Diagnosis not present

## 2016-06-24 DIAGNOSIS — F17200 Nicotine dependence, unspecified, uncomplicated: Secondary | ICD-10-CM | POA: Insufficient documentation

## 2016-06-24 DIAGNOSIS — E7800 Pure hypercholesterolemia, unspecified: Secondary | ICD-10-CM | POA: Insufficient documentation

## 2016-06-24 DIAGNOSIS — Z86010 Personal history of colonic polyps: Secondary | ICD-10-CM | POA: Diagnosis not present

## 2016-06-24 DIAGNOSIS — B0222 Postherpetic trigeminal neuralgia: Secondary | ICD-10-CM

## 2016-06-24 DIAGNOSIS — Z9049 Acquired absence of other specified parts of digestive tract: Secondary | ICD-10-CM | POA: Insufficient documentation

## 2016-06-24 DIAGNOSIS — Z90710 Acquired absence of both cervix and uterus: Secondary | ICD-10-CM | POA: Diagnosis not present

## 2016-06-24 DIAGNOSIS — M792 Neuralgia and neuritis, unspecified: Secondary | ICD-10-CM

## 2016-06-24 DIAGNOSIS — F329 Major depressive disorder, single episode, unspecified: Secondary | ICD-10-CM | POA: Insufficient documentation

## 2016-06-24 DIAGNOSIS — E119 Type 2 diabetes mellitus without complications: Secondary | ICD-10-CM | POA: Diagnosis not present

## 2016-06-24 DIAGNOSIS — Z7984 Long term (current) use of oral hypoglycemic drugs: Secondary | ICD-10-CM | POA: Diagnosis not present

## 2016-06-24 DIAGNOSIS — M1610 Unilateral primary osteoarthritis, unspecified hip: Secondary | ICD-10-CM | POA: Diagnosis not present

## 2016-06-24 DIAGNOSIS — Z79891 Long term (current) use of opiate analgesic: Secondary | ICD-10-CM | POA: Insufficient documentation

## 2016-06-24 DIAGNOSIS — M7918 Myalgia, other site: Secondary | ICD-10-CM

## 2016-06-24 DIAGNOSIS — Z880 Allergy status to penicillin: Secondary | ICD-10-CM | POA: Insufficient documentation

## 2016-06-24 DIAGNOSIS — M791 Myalgia: Secondary | ICD-10-CM | POA: Diagnosis not present

## 2016-06-24 DIAGNOSIS — B029 Zoster without complications: Secondary | ICD-10-CM | POA: Insufficient documentation

## 2016-06-24 HISTORY — DX: Postherpetic trigeminal neuralgia: B02.22

## 2016-06-24 MED ORDER — MORPHINE SULFATE ER 15 MG PO TBCR: 15.0000 mg | EXTENDED_RELEASE_TABLET | Freq: Two times a day (BID) | ORAL | 0 refills | Status: DC

## 2016-06-24 MED ORDER — TIZANIDINE HCL 4 MG PO TABS: 4.0000 mg | ORAL_TABLET | Freq: Three times a day (TID) | ORAL | 2 refills | Status: DC

## 2016-06-24 MED ORDER — GABAPENTIN 800 MG PO TABS: 800.0000 mg | ORAL_TABLET | Freq: Four times a day (QID) | ORAL | 2 refills | Status: DC

## 2016-06-24 MED ORDER — MAGNESIUM OXIDE -MG SUPPLEMENT 500 MG PO CAPS: 1.0000 | ORAL_CAPSULE | Freq: Every day | ORAL | 2 refills | Status: DC

## 2016-06-24 NOTE — Patient Instructions (Signed)

## 2016-06-24 NOTE — Progress Notes (Signed)
Patient's Name: Tammy Acosta  MRN: [id number]  Referring Provider: Leone Haven, MD  DOB: Jan 22, 1956  PCP: Tommi Rumps, MD  DOS: 06/24/2016  Note by: Kathlen Brunswick. Dossie Arbour, MD  Service setting: Ambulatory outpatient  Specialty: Interventional Pain Management  Location: [facility] (AMB) Pain Management Facility    Patient type: Established   Primary Reason(s) for Visit: Encounter for prescription drug management (Level of risk: moderate) CC: Back Pain (lower)  HPI  Ms. Lapka is a 60 y.o. year old, female patient, who comes today for an initial evaluation. She has Chronic pain; Failed back surgical syndrome; Chronic lumbar radicular pain and numbness (Location of Secondary source of pain) (Bilateral) (R>L); Opiate use (30 MME/Day); Opioid dependence ([place]); Long term prescription opiate use; Long term current use of opiate analgesic; Encounter for therapeutic drug level monitoring; Chronic low back pain (Location of Primary Source of Pain) (Bilateral) (L>R); Chronic pain syndrome; Anxiety and depression; Type 2 diabetes mellitus ([place]); Essential (primary) hypertension; Hyperlipidemia; Encounter for chronic pain management; Neurogenic pain; Neuropathic pain; Musculoskeletal pain; Chronic pain of lower extremity (Location of Secondary source of pain) (Bilateral) (R>L); Chronic hip pain (Location of Tertiary source of pain) (Bilateral) (L>R); Osteoarthritis of hip (Location of Tertiary source of pain) (Bilateral) (L>R); Diabetic peripheral neuropathy ([place]); Hypomagnesemia; Tobacco abuse; Chest pain; Shingles rash over at the left V1 distribution; and Trigeminal herpes zoster (left V1 distribution) on her problem list.. Her primarily concern today is the Back Pain (lower)  Pain Assessment: Self-Reported Pain Score: 2 /10             Reported level is compatible with observation.       Pain Type: Chronic pain Pain Location: Back Pain Orientation: Lower Pain Descriptors / Indicators: Aching,  Constant, Sharp, Shooting Pain Frequency: Constant  Ms. Mollenhauer was last seen on 03/30/2016 for medication management. During today's appointment we reviewed Ms. Carrozza's chronic pain status, as well as her outpatient medication regimen. The patient comes in today with a recent case of the shingles on the left V1 distribution over the face. The lesions are not in the cornea, but she does have a lesion close by. The patient is currently not having any vision problems but she was warned that if she starts developing any pain in the eye or difficulty with her vision, she should immediately go to an ophthalmologist. She was provided with antiviral agents by her primary care physician, but she has declined the use of steroids due to her severe diabetes and what the steroids due to her blood sugar. She has continued taking her medications, especially the gabapentin that seems to help with that neuropathic pain.  The patient  reports that she does not use drugs. Her body mass index is 27.99 kg/m.  Further details on both, my assessment(s), as well as the proposed treatment plan, please see below.  Controlled Substance Pharmacotherapy Assessment REMS (Risk Evaluation and Mitigation Strategy)  Analgesic:MS Contin (morphine ER) 15 mg every 12 hours (30 mg/day) MME/day:30 mg/day  Ignatius Specking, RN  06/24/2016  1:02 PM  Sign at close encounter Nursing Pain Medication Assessment:  Safety precautions to be maintained throughout the outpatient stay will include: orient to surroundings, keep bed in low position, maintain call bell within reach at all times, provide assistance with transfer out of bed and ambulation.  Medication Inspection Compliance: Pill count conducted under aseptic conditions, in front of the patient. Neither the pills nor the bottle was removed from the patient's sight at any time. Once  count was completed pills were immediately returned to the patient in their original bottle. Pill Count:  32 of 60 pills remain Bottle Appearance: Standard pharmacy container. Clearly labeled. Medication: See above Filled Date: 10 / 18 / 2017   Pharmacokinetics: Liberation and absorption (onset of action): WNL Distribution (time to peak effect): WNL Metabolism and excretion (duration of action): WNL         Pharmacodynamics: Desired effects: Analgesia: The patient reports >50% benefit. Reported improvement in function: The patient reports medication allows her to accomplish basic ADLs. Clinically meaningful improvement in function (CMIF): Sustained CMIF goals met Perceived effectiveness: Described as relatively effective, allowing for increase in activities of daily living (ADL) Undesirable effects: Side-effects or Adverse reactions: None reported Monitoring: [place] PMP: Online review of the past 71-monthperiod conducted. Compliant with practice rules and regulations List of all UDS test(s) done:  Lab Results  Component Value Date   TOXASSSELUR FINAL 01/15/2016   TOXASSSELUR FINAL 10/21/2015   TOXASSSELUR FINAL 07/22/2015   Last UDS on record: ToxAssure Select 13  Date Value Ref Range Status  01/15/2016 FINAL  Final    Comment:    ==================================================================== TOXASSURE SELECT 13 (MW) ==================================================================== Test                             Result       Flag       Units Drug Present and Declared for Prescription Verification   Morphine                       1784         EXPECTED   ng/mg creat    Potential sources of large amounts of morphine in the absence of    codeine include administration of morphine or use of heroin. ==================================================================== Test                      Result    Flag   Units      Ref Range   Creatinine              151              mg/dL      >=20 ==================================================================== Declared  Medications:  The flagging and interpretation on this report are based on the  following declared medications.  Unexpected results may arise from  inaccuracies in the declared medications.  **Note: The testing scope of this panel includes these medications:  Morphine (MS Contin)  **Note: The testing scope of this panel does not include following  reported medications:  Acetaminophen (Tylenol)  Clonidine (Catapres)  Gabapentin (Neurontin)  Glipizide  Hydrochlorothiazide (HCTZ)  Lisinopril  Magnesium Oxide  Metformin  Sertraline (Zoloft)  Simvastatin (Zocor)  Tizanidine (Zanaflex) ==================================================================== For clinical consultation, please call ([phone] ====================================================================    UDS interpretation: Compliant          Medication Assessment Form: Reviewed. Patient indicates being compliant with therapy Treatment compliance: Compliant Risk Assessment Profile: Aberrant behavior: See prior evaluations. None observed or detected today Comorbid factors increasing risk of overdose: See prior notes. No additional risks detected today Risk of substance use disorder (SUD): Low Opioid Risk Tool (ORT) Total Score: 1  Interpretation Table:  Score <3 = Low Risk for SUD  Score between 4-7 = Moderate Risk for SUD  Score >8 = High Risk for Opioid Abuse   Risk Mitigation Strategies:  Patient Counseling: Covered Patient-Prescriber Agreement (PPA): Present and active  Notification to other healthcare providers: Done  Pharmacologic Plan: No change in therapy, at this time  Laboratory Chemistry  Inflammation Markers Lab Results  Component Value Date   ESRSEDRATE 18 10/21/2015   CRP 1.2 (H) 10/21/2015   Renal Function Lab Results  Component Value Date   BUN 22 01/02/2016   CREATININE 1.13 01/02/2016   GFRAA >60 10/21/2015   GFRNONAA 57 (L) 10/21/2015   Hepatic Function Lab Results   Component Value Date   AST 14 01/02/2016   ALT 11 01/02/2016   ALBUMIN 4.3 01/02/2016   Electrolytes Lab Results  Component Value Date   NA 138 01/02/2016   K 3.9 01/02/2016   CL 101 01/02/2016   CALCIUM 9.2 01/02/2016   MG 1.5 (L) 10/21/2015   Pain Modulating Vitamins No results found for: Maralyn Sago [id number], GY6948NI6, EV0350KX3, 25OHVITD1, 25OHVITD2, 25OHVITD3, VITAMINB12 Coagulation Parameters Lab Results  Component Value Date   PLT 326 08/05/2015   Cardiovascular Lab Results  Component Value Date   HGB 13.5 08/05/2015   HCT 40.4 08/05/2015   Note: Lab results reviewed.  Recent Diagnostic Imaging Review  Nm Myocar Multi W/spect W/wall Motion / Ef  Result Date: 01/22/2016 Pharmacological myocardial perfusion imaging study with no significant  ischemia Normal wall motion, EF estimated at 76% No EKG changes concerning for ischemia at peak stress or in recovery. Low risk scan Signed, Esmond Plants, MD, Ph.D [facility]   Note: Imaging results reviewed.  Meds  The patient has a current medication list which includes the following prescription(s): acetaminophen, aspirin ec, blood glucose meter kit and supplies, one touch ultra 2, clonidine, gabapentin, glipizide, hydrochlorothiazide, lisinopril, magnesium oxide, metformin, morphine, morphine, morphine, nitroglycerin, sertraline, simvastatin, and tizanidine.  Current Outpatient Prescriptions on File Prior to Visit  Medication Sig  . acetaminophen (RA ACETAMINOPHEN) 650 MG CR tablet Take 1,300 mg by mouth daily.   Marland Kitchen aspirin EC 81 MG tablet Take 1 tablet (81 mg total) by mouth daily.  . blood glucose meter kit and supplies Dispense One touch meter.  E11.9  . Blood Glucose Monitoring Suppl (ONE TOUCH ULTRA 2) w/Device KIT   . cloNIDine (CATAPRES) 0.1 MG tablet take 1 tablet by mouth twice a day if needed  . glipiZIDE (GLUCOTROL) 5 MG tablet Take 1 tablet (5 mg total) by mouth daily before breakfast.  . hydrochlorothiazide  (HYDRODIURIL) 25 MG tablet Take 1 tablet (25 mg total) by mouth daily.  Marland Kitchen lisinopril (PRINIVIL,ZESTRIL) 40 MG tablet Take 1 tablet (40 mg total) by mouth daily.  . metFORMIN (GLUCOPHAGE) 1000 MG tablet Take 1 tablet (1,000 mg total) by mouth 2 (two) times daily with a meal.  . nitroGLYCERIN (NITROSTAT) 0.4 MG SL tablet Place 1 tablet (0.4 mg total) under the tongue every 5 (five) minutes as needed for chest pain.  Marland Kitchen sertraline (ZOLOFT) 100 MG tablet Take 1 tablet (100 mg total) by mouth daily.  . simvastatin (ZOCOR) 40 MG tablet Take 1 tablet (40 mg total) by mouth every evening.   No current facility-administered medications on file prior to visit.    ROS  Constitutional: Denies any fever or chills Gastrointestinal: No reported hemesis, hematochezia, vomiting, or acute GI distress Musculoskeletal: Denies any acute onset joint swelling, redness, loss of ROM, or weakness Neurological: No reported episodes of acute onset apraxia, aphasia, dysarthria, agnosia, amnesia, paralysis, loss of coordination, or loss of consciousness  Allergies  Ms. Orihuela is allergic to penicillins and tetracyclines &  related.  [place]  Drug: Ms. Kellogg  reports that she does not use drugs. Alcohol:  reports that she does not drink alcohol. Tobacco:  reports that she has been smoking Cigarettes.  She has a 10.00 pack-year smoking history. She has never used smokeless tobacco. Medical:  has a past medical history of Altered mental status (02/04/2014); Arthritis; Colon polyps; Depression; Diabetes mellitus without complication ([employer]); Hypercholesteremia; Hyperlipidemia; Hypertension; Shingles; Spine disorder; and Viral upper respiratory illness (12/03/2015). Family: family history includes Heart disease in her father and mother; Hypertension in her mother.  Past Surgical History:  Procedure Laterality Date  . ABDOMINAL HYSTERECTOMY    . BACK SURGERY    . CHOLECYSTECTOMY    . KYPHOPLASTY     Constitutional Exam   General appearance: Well nourished, well developed, and well hydrated. In no apparent acute distress Vitals:   06/24/16 1248  BP: (!) 156/97  Pulse: 91  Resp: 16  Temp: 98.6 F (37 C)  TempSrc: Oral  SpO2: 99%  Weight: 158 lb (71.7 kg)  Height: 5' 3" (1.6 m)   BMI Assessment: Estimated body mass index is 27.99 kg/m as calculated from the following:   Height as of this encounter: 5' 3" (1.6 m).   Weight as of this encounter: 158 lb (71.7 kg).  BMI interpretation table: BMI level Category Range association with higher incidence of chronic pain  <18 kg/m2 Underweight   18.5-24.9 kg/m2 Ideal body weight   25-29.9 kg/m2 Overweight Increased incidence by 20%  30-34.9 kg/m2 Obese (Class I) Increased incidence by 68%  35-39.9 kg/m2 Severe obesity (Class II) Increased incidence by 136%  >40 kg/m2 Extreme obesity (Class III) Increased incidence by 254%   BMI Readings from Last 4 Encounters:  06/24/16 27.99 kg/m  03/30/16 27.63 kg/m  02/13/16 28.87 kg/m  01/16/16 28.74 kg/m   Wt Readings from Last 4 Encounters:  06/24/16 158 lb (71.7 kg)  03/30/16 156 lb (70.8 kg)  02/13/16 163 lb (73.9 kg)  01/16/16 162 lb 4 oz (73.6 kg)  Psych/Mental status: Alert, oriented x 3 (person, place, & time) Eyes: PERLA Respiratory: No evidence of acute respiratory distress  Cervical Spine Exam  Inspection: The patient seems to have lesions from the shingles on the left upper portion of the face coloring the V1 distribution. She has a lesion close to the eye, but not in the cornea. She denies any problems with the patient. Alignment: Symmetrical Functional ROM: Unrestricted ROM Stability: No instability detected Muscle strength & Tone: Functionally intact Sensory: Unimpaired Palpation: Non-contributory  Upper Extremity (UE) Exam    Side: Right upper extremity  Side: Left upper extremity  Inspection: No masses, redness, swelling, or asymmetry  Inspection: No masses, redness, swelling, or  asymmetry  Functional ROM: Unrestricted ROM         Functional ROM: Unrestricted ROM          Muscle strength & Tone: Functionally intact  Muscle strength & Tone: Functionally intact  Sensory: Unimpaired  Sensory: Unimpaired  Palpation: Non-contributory  Palpation: Non-contributory   Thoracic Spine Exam  Inspection: No masses, redness, or swelling Alignment: Symmetrical Functional ROM: Unrestricted ROM Stability: No instability detected Sensory: Unimpaired Muscle strength & Tone: Functionally intact Palpation: Non-contributory  Lumbar Spine Exam  Inspection: No masses, redness, or swelling Alignment: Symmetrical Functional ROM: Unrestricted ROM Stability: No instability detected Muscle strength & Tone: Functionally intact Sensory: Unimpaired Palpation: Non-contributory Provocative Tests: Lumbar Hyperextension and rotation test: evaluation deferred today       Patrick's Maneuver:  evaluation deferred today              Gait & Posture Assessment  Ambulation: Unassisted Gait: Relatively normal for age and body habitus Posture: WNL   Lower Extremity Exam    Side: Right lower extremity  Side: Left lower extremity  Inspection: No masses, redness, swelling, or asymmetry  Inspection: No masses, redness, swelling, or asymmetry  Functional ROM: Unrestricted ROM          Functional ROM: Unrestricted ROM          Muscle strength & Tone: Functionally intact  Muscle strength & Tone: Functionally intact  Sensory: Unimpaired  Sensory: Unimpaired  Palpation: Non-contributory  Palpation: Non-contributory   Assessment  Primary Diagnosis & Pertinent Problem List: The primary encounter diagnosis was Neuropathic pain. Diagnoses of Chronic pain syndrome, Hypomagnesemia, Musculoskeletal pain, Neurogenic pain, Trigeminal herpes zoster, and Trigeminal herpes zoster (left V1 distribution) were also pertinent to this visit.  Visit Diagnosis: 1. Neuropathic pain   2. Chronic pain syndrome   3.  Hypomagnesemia   4. Musculoskeletal pain   5. Neurogenic pain   6. Trigeminal herpes zoster   7. Trigeminal herpes zoster (left V1 distribution)    Plan of Care  Pharmacotherapy (Medications Ordered): Meds ordered this encounter  Medications  . morphine (MS CONTIN) 15 MG 12 hr tablet    Sig: Take 1 tablet (15 mg total) by mouth every 12 (twelve) hours.    Dispense:  60 tablet    Refill:  0    Do not place this medication, or any other prescription from our practice, on "Automatic Refill". Patient may have prescription filled one day early if pharmacy is closed on scheduled refill date. Do not fill until: 07/17/16 To last until: 08/16/16  . morphine (MS CONTIN) 15 MG 12 hr tablet    Sig: Take 1 tablet (15 mg total) by mouth every 12 (twelve) hours.    Dispense:  60 tablet    Refill:  0    Do not place this medication, or any other prescription from our practice, on "Automatic Refill". Patient may have prescription filled one day early if pharmacy is closed on scheduled refill date. Do not fill until: 08/16/16 To last until: 09/15/16  . morphine (MS CONTIN) 15 MG 12 hr tablet    Sig: Take 1 tablet (15 mg total) by mouth every 12 (twelve) hours.    Dispense:  60 tablet    Refill:  0    Do not place this medication, or any other prescription from our practice, on "Automatic Refill". Patient may have prescription filled one day early if pharmacy is closed on scheduled refill date. Do not fill until: 09/15/16 To last until: 10/15/16  . Magnesium Oxide 500 MG CAPS    Sig: Take 1 capsule (500 mg total) by mouth daily.    Dispense:  30 capsule    Refill:  2    Do not place this medication, or any other prescription from our practice, on "Automatic Refill". Patient may have prescription filled one day early if pharmacy is closed on scheduled refill date.  Marland Kitchen tiZANidine (ZANAFLEX) 4 MG tablet    Sig: Take 1 tablet (4 mg total) by mouth 3 (three) times daily.    Dispense:  90 tablet     Refill:  2    Do not add this medication to the electronic "Automatic Refill" notification system. Patient may have prescription filled one day early if pharmacy is closed on scheduled refill date.  Marland Kitchen  gabapentin (NEURONTIN) 800 MG tablet    Sig: Take 1 tablet (800 mg total) by mouth every 6 (six) hours.    Dispense:  120 tablet    Refill:  2    Do not place this medication, or any other prescription from our practice, on "Automatic Refill". Patient may have prescription filled one day early if pharmacy is closed on scheduled refill date.   New Prescriptions   No medications on file   Medications administered during this visit: Ms. Crager had no medications administered during this visit. Lab-work, Procedure(s), & Referral(s) Ordered: No orders of the defined types were placed in this encounter.  Imaging & Referral(s) Ordered: None   Interventional Therapies: Pending/Scheduled/Planned:   None at this time.    Considering:  Diagnostic bilateral intra-articular hip joint injection under fluoroscopic guidance, with or without sedation.  Bilateral diagnostic lumbar facet block under fluoroscopic guidance and IV sedation.  Right L4-5 lumbar epidural steroid injection under fluoroscopic guidance, without without sedation.  Palliative bilateral L5-S1 transforaminal epidural steroid injection under fluoroscopic guidance and IV sedation.  Palliative caudal epidural steroid injection under fluoroscopic guidance, with or without sedation.    PRN Procedures:  Diagnostic bilateral intra-articular hip joint injection under fluoroscopic guidance, with or without sedation.  Bilateral diagnostic lumbar facet block under fluoroscopic guidance and IV sedation.  Right L4-5 lumbar epidural steroid injection under fluoroscopic guidance, without without sedation.  Palliative bilateral L5-S1 transforaminal epidural steroid injection under fluoroscopic guidance and IV sedation.  Palliative caudal epidural  steroid injection under fluoroscopic guidance, with or without sedation.    Requested PM Follow-up: Return in 3 months (on 09/24/2016) for Med-Mgmt.  Future Appointments Date Time Provider [place]  09/24/2016 1:30 PM Milinda Pointer, MD [facility] None   Primary Care Physician: Tommi Rumps, MD Location: [facility] Note by: Kathlen Brunswick. Dossie Arbour, M.D, DABA, DABAPM, DABPM, DABIPP, FIPP  Pain Score Disclaimer: We use the NRS-11 scale. This is a self-reported, subjective measurement of pain severity with only modest accuracy. It is used primarily to identify changes within a particular patient. It must be understood that outpatient pain scales are significantly less accurate that those used for research, where they can be applied under ideal controlled circumstances with minimal exposure to variables. In reality, the score is likely to be a combination of pain intensity and pain affect, where pain affect describes the degree of emotional arousal or changes in action readiness caused by the sensory experience of pain. Factors such as social and work situation, setting, emotional state, anxiety levels, expectation, and prior pain experience may influence pain perception and show large inter-individual differences that may also be affected by time variables.  Patient instructions provided during this appointment: Patient Instructions  Pain Management Discharge Instructions  General Discharge Instructions :  If you need to reach your doctor call: Monday-Friday 8:00 am - 4:00 pm at [phone] or toll free [phone].  After clinic hours [phone] to have operator reach doctor.  Bring all of your medication bottles to all your appointments in the pain clinic.  To cancel or reschedule your appointment with Pain Management please remember to call 24 hours in advance to avoid a fee.  Refer to the educational materials which you have been given on:  General Risks, I had my Procedure. Discharge Instructions, Post Sedation.  Post Procedure Instructions:  The drugs you were given will stay in your system until tomorrow, so for the next 24 hours you should not drive, make any legal  decisions or drink any alcoholic beverages.  You may eat anything you prefer, but it is better to start with liquids then soups and crackers, and gradually work up to solid foods.  Please notify your doctor immediately if you have any unusual bleeding, trouble breathing or pain that is not related to your normal pain.  Depending on the type of procedure that was done, some parts of your body may feel week and/or numb.  This usually clears up by tonight or the next day.  Walk with the use of an assistive device or accompanied by an adult for the 24 hours.  You may use ice on the affected area for the first 24 hours.  Put ice in a Ziploc bag and cover with a towel and place against area 15 minutes on 15 minutes off.  You may switch to heat after 24 hours.

## 2016-06-24 NOTE — Progress Notes (Signed)
Nursing Pain Medication Assessment:  Safety precautions to be maintained throughout the outpatient stay will include: orient to surroundings, keep bed in low position, maintain call bell within reach at all times, provide assistance with transfer out of bed and ambulation.  Medication Inspection Compliance: Pill count conducted under aseptic conditions, in front of the patient. Neither the pills nor the bottle was removed from the patient's sight at any time. Once count was completed pills were immediately returned to the patient in their original bottle. Pill Count: 32 of 60 pills remain Bottle Appearance: Standard pharmacy container. Clearly labeled. Medication: See above Filled Date: 10 / 18 / 2017

## 2016-06-26 ENCOUNTER — Encounter: Payer: Self-pay | Admitting: Pain Medicine

## 2016-07-09 ENCOUNTER — Telehealth: Payer: Self-pay | Admitting: *Deleted

## 2016-07-09 NOTE — Telephone Encounter (Signed)
Patient states script dated to fill 07-17-2016. Her last fill was on 06-10-2016 at [facility]. Patient did not fill script dated for September due to not having the money for co-pay. States she tore up the script. She will be out of medications 07-10-2016 and next script not due to fill until 07-17-16. Will consult with Dr. Laban EmperorNaveira as to what to tell patient regarding this matter.

## 2016-07-10 ENCOUNTER — Other Ambulatory Visit: Payer: Self-pay | Admitting: Family Medicine

## 2016-08-12 ENCOUNTER — Other Ambulatory Visit: Payer: Self-pay | Admitting: Family Medicine

## 2016-08-25 ENCOUNTER — Ambulatory Visit: Payer: Medicare Other

## 2016-09-24 ENCOUNTER — Ambulatory Visit: Payer: Medicare Other | Attending: Pain Medicine | Admitting: Pain Medicine

## 2016-09-24 ENCOUNTER — Encounter: Payer: Self-pay | Admitting: Pain Medicine

## 2016-09-24 VITALS — BP 184/99 | HR 87 | Temp 98.5°F | Resp 16 | Ht 63.0 in | Wt 160.0 lb

## 2016-09-24 DIAGNOSIS — B0222 Postherpetic trigeminal neuralgia: Secondary | ICD-10-CM

## 2016-09-24 DIAGNOSIS — Z7984 Long term (current) use of oral hypoglycemic drugs: Secondary | ICD-10-CM | POA: Diagnosis not present

## 2016-09-24 DIAGNOSIS — M7918 Myalgia, other site: Secondary | ICD-10-CM

## 2016-09-24 DIAGNOSIS — Z7982 Long term (current) use of aspirin: Secondary | ICD-10-CM | POA: Diagnosis not present

## 2016-09-24 DIAGNOSIS — Z881 Allergy status to other antibiotic agents status: Secondary | ICD-10-CM | POA: Diagnosis not present

## 2016-09-24 DIAGNOSIS — M791 Myalgia: Secondary | ICD-10-CM

## 2016-09-24 DIAGNOSIS — Z90710 Acquired absence of both cervix and uterus: Secondary | ICD-10-CM | POA: Diagnosis not present

## 2016-09-24 DIAGNOSIS — F329 Major depressive disorder, single episode, unspecified: Secondary | ICD-10-CM | POA: Insufficient documentation

## 2016-09-24 DIAGNOSIS — G894 Chronic pain syndrome: Secondary | ICD-10-CM | POA: Diagnosis not present

## 2016-09-24 DIAGNOSIS — Z981 Arthrodesis status: Secondary | ICD-10-CM | POA: Diagnosis not present

## 2016-09-24 DIAGNOSIS — M25552 Pain in left hip: Secondary | ICD-10-CM | POA: Diagnosis not present

## 2016-09-24 DIAGNOSIS — I10 Essential (primary) hypertension: Secondary | ICD-10-CM | POA: Diagnosis not present

## 2016-09-24 DIAGNOSIS — Z880 Allergy status to penicillin: Secondary | ICD-10-CM | POA: Diagnosis not present

## 2016-09-24 DIAGNOSIS — M545 Low back pain: Secondary | ICD-10-CM | POA: Diagnosis not present

## 2016-09-24 DIAGNOSIS — M160 Bilateral primary osteoarthritis of hip: Secondary | ICD-10-CM | POA: Diagnosis not present

## 2016-09-24 DIAGNOSIS — B029 Zoster without complications: Secondary | ICD-10-CM | POA: Diagnosis not present

## 2016-09-24 DIAGNOSIS — F17200 Nicotine dependence, unspecified, uncomplicated: Secondary | ICD-10-CM | POA: Diagnosis not present

## 2016-09-24 DIAGNOSIS — E119 Type 2 diabetes mellitus without complications: Secondary | ICD-10-CM | POA: Insufficient documentation

## 2016-09-24 DIAGNOSIS — M792 Neuralgia and neuritis, unspecified: Secondary | ICD-10-CM

## 2016-09-24 DIAGNOSIS — Z8249 Family history of ischemic heart disease and other diseases of the circulatory system: Secondary | ICD-10-CM | POA: Insufficient documentation

## 2016-09-24 DIAGNOSIS — Z86010 Personal history of colonic polyps: Secondary | ICD-10-CM | POA: Insufficient documentation

## 2016-09-24 DIAGNOSIS — Z9049 Acquired absence of other specified parts of digestive tract: Secondary | ICD-10-CM | POA: Diagnosis not present

## 2016-09-24 DIAGNOSIS — Z79891 Long term (current) use of opiate analgesic: Secondary | ICD-10-CM | POA: Diagnosis not present

## 2016-09-24 DIAGNOSIS — G8929 Other chronic pain: Secondary | ICD-10-CM

## 2016-09-24 DIAGNOSIS — E7800 Pure hypercholesterolemia, unspecified: Secondary | ICD-10-CM | POA: Insufficient documentation

## 2016-09-24 MED ORDER — MAGNESIUM OXIDE -MG SUPPLEMENT 500 MG PO CAPS: 1.0000 | ORAL_CAPSULE | Freq: Every day | ORAL | 1 refills | Status: DC

## 2016-09-24 MED ORDER — TIZANIDINE HCL 4 MG PO TABS: 4.0000 mg | ORAL_TABLET | Freq: Three times a day (TID) | ORAL | 1 refills | Status: DC

## 2016-09-24 MED ORDER — OXYCODONE HCL 5 MG PO TABS: 5.0000 mg | ORAL_TABLET | Freq: Three times a day (TID) | ORAL | 0 refills | Status: DC

## 2016-09-24 MED ORDER — GABAPENTIN 800 MG PO TABS: 800.0000 mg | ORAL_TABLET | Freq: Four times a day (QID) | ORAL | 1 refills | Status: DC

## 2016-09-24 NOTE — Progress Notes (Deleted)
Nursing Pain Medication Assessment:  Safety precautions to be maintained throughout the outpatient stay will include: orient to surroundings, keep bed in low position, maintain call bell within reach at all times, provide assistance with transfer out of bed and ambulation.  Medication Inspection Compliance: {Blank single:19197::"Ms. Hoes did not comply with our request to bring her pills to be counted. She was reminded that bringing the medication bottles, even when empty, is a requirement.","Pill count conducted under aseptic conditions, in front of the patient. Neither the pills nor the bottle was removed from the patient's sight at any time. Once count was completed pills were immediately returned to the patient in their original bottle."} Pill Count: {Blank single:19197::"No pills available to be counted today.","*** of *** pills remain"} Bottle Appearance: {Blank single:19197::"No label. Patient informed that medications must be transported in properly and accurately labeled containers.","Non-pharmacy container. Patient reminded that prescription medications must be kept in original, labeled, pharmacy bottle.","Old prescription bottle. Patient reminded that medications should always be kept in the newest prescription bottle.","Standard pharmacy container. Clearly labeled."} Medication: {Blank single:19197::"Buprenorphine (Suboxone)","Butorphanol (Stadol)","Hydrocodone/APAP","Hydromorphone (Dilaudid)","Methadone","Morphine ER (MSContin)","Morphine IR","Oxycodone ER (OxyContin)","Oxycodone IR","Oxycodone/APAP","Oxymorphone (Opana)","Tapentadol (Nucynta)","Tramadol (Ultram)","See above"} Filled Date: {Blank single:19197::"01","02","03","04","05","06","07","08","09","10","11","12"} / *** / {Blank single:19197::"2020","2019","2018","2017"}

## 2016-09-24 NOTE — Progress Notes (Signed)
Patient's Name: Tammy Acosta  MRN: [id number]  Referring Provider: Leone Haven, MD  DOB: [birth year]  PCP: Leone Haven, MD  DOS: 09/24/2016  Note by: Kathlen Brunswick. Dossie Arbour, MD  Service setting: Ambulatory outpatient  Specialty: Interventional Pain Management  Location: [facility] (AMB) Pain Management Facility    Patient type: Established   Primary Reason(s) for Visit: Encounter for prescription drug management (Level of risk: moderate) CC: No chief complaint on file.  HPI  Tammy Acosta is a 61 y.o. year old, female patient, who comes today for a medication management evaluation. She has Failed back surgical syndrome; Chronic lumbar radicular pain and numbness (Location of Secondary source of pain) (Bilateral) (R>L); Opiate use (30 MME/Day); Opioid dependence ([place]); Long term prescription opiate use; Long term current use of opiate analgesic; Encounter for therapeutic drug level monitoring; Chronic low back pain (Location of Primary Source of Pain) (Bilateral) (L>R); Chronic pain syndrome; Anxiety and depression; Type 2 diabetes mellitus ([place]); Essential (primary) hypertension; Hyperlipidemia; Encounter for chronic pain management; Neurogenic pain; Neuropathic pain; Musculoskeletal pain; Chronic pain of lower extremity (Location of Secondary source of pain) (Bilateral) (R>L); Chronic hip pain (Location of Tertiary source of pain) (Bilateral) (L>R); Osteoarthritis of hip (Location of Tertiary source of pain) (Bilateral) (L>R); Diabetic peripheral neuropathy ([place]); Hypomagnesemia; Tobacco abuse; Chest pain; Shingles rash over at the left V1 distribution; and Trigeminal herpes zoster (left V1 distribution) on her problem list. Her primarily concern today is the No chief complaint on file.  Pain Assessment: Self-Reported Pain Score: 7  (has not taken meds in over a week )/10 Clinically the patient looks like a 5/10 Reported level is inconsistent with clinical observations. Information on the proper  use of the pain scale provided to the patient today Pain Type: Chronic pain Pain Location: Back Pain Orientation: Lower Pain Descriptors / Indicators: Constant, Aching, Pins and needles, Discomfort (Unable to afford medications) Pain Frequency: Constant  Tammy Acosta was last seen on 06/24/2016 for medication management. During today's appointment we reviewed Ms. Mccort's chronic pain status, as well as her outpatient medication regimen. Unfortunately, the patient is having some financial problems and was not able to afford the pain medication. She comes in today her and she has tapered herself completely off of the morphine. She did go through some withdrawals but she also brought back to of the 3 prescriptions that I gave her. We have taken position of those, voided them, and scanned them into the electronic medical record. Today she is having a lot of pain in the lower back and hip area. Physical exam shows her to have bilateral hip joint pain as well as right-sided sacroiliac joint pain. The worst seems to be the left hip and therefore we will bring her back for a diagnostic intra-articular hip joint injection with local anesthetics only. She has severe diabetes and is concerned about her blood sugar going up with the steroids. Ejection with the local anesthetic only, we will simply determine whether or not the pain is coming from the hip joint itself or if it's referred from the lower back. Today we have also ordered some x-rays of both of her hip joints and all for lower back where she is also having some pain. Today we attempted to call her pharmacy to see if there were any options that would be less expensive. Unfortunately, we were informed that unless there is a prescription they cannot go into the system to determine the cost of the medication. Therefore, today we have  given her a trial with oxycodone IR 5 mg to be taken 1 tablet by mouth 3 times a day to see if the cost of this medication is less.  I will see her back in one month for assessment on the medication and determine whether or not she can afford it.  The patient  reports that she does not use drugs. Her body mass index is 28.34 kg/m.  Further details on both, my assessment(s), as well as the proposed treatment plan, please see below.  Controlled Substance Pharmacotherapy Assessment REMS (Risk Evaluation and Mitigation Strategy)  Analgesic: Oxycodone IR 5 mg 1 tablet by mouth 3 times a day (15 mg/day of oxycodone) MME/day: 22.5 mg/day.  Ignatius Specking, RN  09/24/2016  1:40 PM  Sign at close encounter Nursing Pain Medication Assessment:  Safety precautions to be maintained throughout the outpatient stay will include: orient to surroundings, keep bed in low position, maintain call bell within reach at all times, provide assistance with transfer out of bed and ambulation.  Medication Inspection Compliance: Ms. Cyphers did not comply with our request to bring her pills to be counted. She was reminded that bringing the medication bottles, even when empty, is a requirement. Pill/Patch Count: None available to be counted. Bottle Appearance: No container available. Did not bring bottle(s) to appointment. Medication: None brought in. Filled Date: N/A Last Medication intake:  09/11/16   Pt brought back  Two Prescriptions- can not afford medications and has been weaning off   Pharmacokinetics: Liberation and absorption (onset of action): WNL Distribution (time to peak effect): WNL Metabolism and excretion (duration of action): WNL         Pharmacodynamics: Desired effects: Analgesia: Ms. Scifres reports >50% benefit. Functional ability: Patient reports that medication allows her to accomplish basic ADLs Clinically meaningful improvement in function (CMIF): Sustained CMIF goals met Perceived effectiveness: Described as relatively effective, allowing for increase in activities of daily living (ADL) Undesirable  effects: Side-effects or Adverse reactions: None reported Monitoring: [place] PMP: Online review of the past 23-monthperiod conducted. Compliant with practice rules and regulations List of all UDS test(s) done:  Lab Results  Component Value Date   TOXASSSELUR FINAL 01/15/2016   TOXASSSELUR FINAL 10/21/2015   TOXASSSELUR FINAL 07/22/2015   Last UDS on record: ToxAssure Select 13  Date Value Ref Range Status  01/15/2016 FINAL  Final    Comment:    ==================================================================== TOXASSURE SELECT 13 (MW) ==================================================================== Test                             Result       Flag       Units Drug Present and Declared for Prescription Verification   Morphine                       1784         EXPECTED   ng/mg creat    Potential sources of large amounts of morphine in the absence of    codeine include administration of morphine or use of heroin. ==================================================================== Test                      Result    Flag   Units      Ref Range   Creatinine              151  mg/dL      >=20 ==================================================================== Declared Medications:  The flagging and interpretation on this report are based on the  following declared medications.  Unexpected results may arise from  inaccuracies in the declared medications.  **Note: The testing scope of this panel includes these medications:  Morphine (MS Contin)  **Note: The testing scope of this panel does not include following  reported medications:  Acetaminophen (Tylenol)  Clonidine (Catapres)  Gabapentin (Neurontin)  Glipizide  Hydrochlorothiazide (HCTZ)  Lisinopril  Magnesium Oxide  Metformin  Sertraline (Zoloft)  Simvastatin (Zocor)  Tizanidine (Zanaflex) ==================================================================== For clinical consultation, please call [phone]. ====================================================================    UDS interpretation: Compliant          Medication Assessment Form: Reviewed. Patient indicates being compliant with therapy Treatment compliance: Compliant Risk Assessment Profile: Aberrant behavior: See prior evaluations. None observed or detected today Comorbid factors increasing risk of overdose: See prior notes. No additional risks detected today Risk of substance use disorder (SUD): Low Opioid Risk Tool (ORT) Total Score: 1  Interpretation Table:  Score <3 = Low Risk for SUD  Score between 4-7 = Moderate Risk for SUD  Score >8 = High Risk for Opioid Abuse   Risk Mitigation Strategies:  Patient Counseling: Covered Patient-Prescriber Agreement (PPA): Present and active  Notification to other healthcare providers: Done  Pharmacologic Plan: No change in therapy, at this time  Laboratory Chemistry  Inflammation Markers Lab Results  Component Value Date   ESRSEDRATE 18 10/21/2015   CRP 1.2 (H) 10/21/2015   Renal Function Lab Results  Component Value Date   BUN 22 01/02/2016   CREATININE 1.13 01/02/2016   GFRAA >60 10/21/2015   GFRNONAA 57 (L) 10/21/2015   Hepatic Function Lab Results  Component Value Date   AST 14 01/02/2016   ALT 11 01/02/2016   ALBUMIN 4.3 01/02/2016   Electrolytes Lab Results  Component Value Date   NA 138 01/02/2016   K 3.9 01/02/2016   CL 101 01/02/2016   CALCIUM 9.2 01/02/2016   MG 1.5 (L) 10/21/2015   Pain Modulating Vitamins No results found for: Maralyn Sago [id number], LA4536IW8, EH2122QM2, 25OHVITD1, 25OHVITD2, 25OHVITD3, VITAMINB12 Coagulation Parameters Lab Results  Component Value Date   PLT 326 08/05/2015   Cardiovascular Lab Results  Component Value Date   HGB 13.5 08/05/2015   HCT 40.4 08/05/2015   Note: Lab results reviewed.  Recent Diagnostic Imaging Review  Nm Myocar Multi W/spect W/wall Motion / Ef  Result Date:  01/22/2016 Pharmacological myocardial perfusion imaging study with no significant  ischemia Normal wall motion, EF estimated at 76% No EKG changes concerning for ischemia at peak stress or in recovery. Low risk scan Signed, Esmond Plants, MD, Ph.D [facility]   Note: Imaging results reviewed.          Meds  The patient has a current medication list which includes the following prescription(s): acetaminophen, aspirin ec, blood glucose meter kit and supplies, one touch ultra 2, clonidine, gabapentin, glipizide, hydrochlorothiazide, lisinopril, magnesium oxide, metformin, morphine, nitroglycerin, oxycodone, sertraline, simvastatin, and tizanidine.  Current Outpatient Prescriptions on File Prior to Visit  Medication Sig  . acetaminophen (RA ACETAMINOPHEN) 650 MG CR tablet Take 1,300 mg by mouth daily.   Marland Kitchen aspirin EC 81 MG tablet Take 1 tablet (81 mg total) by mouth daily.  . blood glucose meter kit and supplies Dispense One touch meter.  E11.9  . Blood Glucose Monitoring Suppl (ONE TOUCH ULTRA 2) w/Device KIT   . cloNIDine (CATAPRES) 0.1 MG tablet  take 1 tablet by mouth twice a day if needed  . glipiZIDE (GLUCOTROL) 5 MG tablet take 1 tablet by mouth daily BEFORE BREAKFAST  . hydrochlorothiazide (HYDRODIURIL) 25 MG tablet Take 1 tablet (25 mg total) by mouth daily.  Marland Kitchen lisinopril (PRINIVIL,ZESTRIL) 40 MG tablet Take 1 tablet (40 mg total) by mouth daily.  . metFORMIN (GLUCOPHAGE) 1000 MG tablet take 1 tablet by mouth twice a day with food  . morphine (MS CONTIN) 15 MG 12 hr tablet Take 1 tablet (15 mg total) by mouth every 12 (twelve) hours.  . nitroGLYCERIN (NITROSTAT) 0.4 MG SL tablet Place 1 tablet (0.4 mg total) under the tongue every 5 (five) minutes as needed for chest pain.  Marland Kitchen sertraline (ZOLOFT) 100 MG tablet Take 1 tablet (100 mg total) by mouth daily.  . simvastatin (ZOCOR) 40 MG tablet take 1 tablet by mouth every evening   No current facility-administered medications on file prior to  visit.    ROS  Constitutional: Denies any fever or chills Gastrointestinal: No reported hemesis, hematochezia, vomiting, or acute GI distress Musculoskeletal: Denies any acute onset joint swelling, redness, loss of ROM, or weakness Neurological: No reported episodes of acute onset apraxia, aphasia, dysarthria, agnosia, amnesia, paralysis, loss of coordination, or loss of consciousness  Allergies  Ms. Berni is allergic to penicillins and tetracyclines & related.  [place]  Drug: Ms. Goodson  reports that she does not use drugs. Alcohol:  reports that she does not drink alcohol. Tobacco:  reports that she has been smoking Cigarettes.  She has a 10.00 pack-year smoking history. She has never used smokeless tobacco. Medical:  has a past medical history of Altered mental status (02/04/2014); Arthritis; Colon polyps; Depression; Diabetes mellitus without complication ([place]); Hypercholesteremia; Hyperlipidemia; Hypertension; Shingles; Spine disorder; and Viral upper respiratory illness (12/03/2015). Family: family history includes Heart disease in her father and mother; Hypertension in her mother.  Past Surgical History:  Procedure Laterality Date  . ABDOMINAL HYSTERECTOMY    . BACK SURGERY    . CHOLECYSTECTOMY    . KYPHOPLASTY     Constitutional Exam  General appearance: Well nourished, well developed, and well hydrated. In no apparent acute distress Vitals:   09/24/16 1322  BP: (!) 184/99  Pulse: 87  Resp: 16  Temp: 98.5 F (36.9 C)  TempSrc: Oral  SpO2: 97%  Weight: 160 lb (72.6 kg)  Height: 5' 3"  (1.6 m)   BMI Assessment: Estimated body mass index is 28.34 kg/m as calculated from the following:   Height as of this encounter: 5' 3"  (1.6 m).   Weight as of this encounter: 160 lb (72.6 kg).  BMI interpretation table: BMI level Category Range association with higher incidence of chronic pain  <18 kg/m2 Underweight   18.5-24.9 kg/m2 Ideal body weight   25-29.9 kg/m2 Overweight  Increased incidence by 20%  30-34.9 kg/m2 Obese (Class I) Increased incidence by 68%  35-39.9 kg/m2 Severe obesity (Class II) Increased incidence by 136%  >40 kg/m2 Extreme obesity (Class III) Increased incidence by 254%   BMI Readings from Last 4 Encounters:  09/24/16 28.34 kg/m  06/24/16 27.99 kg/m  03/30/16 27.63 kg/m  02/13/16 28.87 kg/m   Wt Readings from Last 4 Encounters:  09/24/16 160 lb (72.6 kg)  06/24/16 158 lb (71.7 kg)  03/30/16 156 lb (70.8 kg)  02/13/16 163 lb (73.9 kg)  Psych/Mental status: Alert, oriented x 3 (person, place, & time)       Eyes: PERLA Respiratory: No evidence of acute respiratory  distress  Cervical Spine Exam  Inspection: No masses, redness, or swelling Alignment: Symmetrical Functional ROM: Unrestricted ROM Stability: No instability detected Muscle strength & Tone: Functionally intact Sensory: Unimpaired Palpation: Non-contributory  Upper Extremity (UE) Exam    Side: Right upper extremity  Side: Left upper extremity  Inspection: No masses, redness, swelling, or asymmetry  Inspection: No masses, redness, swelling, or asymmetry  Functional ROM: Unrestricted ROM          Functional ROM: Unrestricted ROM          Muscle strength & Tone: Functionally intact  Muscle strength & Tone: Functionally intact  Sensory: Unimpaired  Sensory: Unimpaired  Palpation: Non-contributory  Palpation: Non-contributory   Thoracic Spine Exam  Inspection: No masses, redness, or swelling Alignment: Symmetrical Functional ROM: Unrestricted ROM Stability: No instability detected Sensory: Unimpaired Muscle strength & Tone: Functionally intact Palpation: Non-contributory  Lumbar Spine Exam  Inspection: Well healed scar from previous spine surgery detected Alignment: Symmetrical Functional ROM: Decreased ROM Stability: No instability detected Muscle strength & Tone: Functionally intact Sensory: Movement-associated pain Palpation: Complains of area being  tender to palpation Provocative Tests: Lumbar Hyperextension and rotation test: Positive bilaterally for facet joint pain. Patrick's Maneuver: Positive for right-sided S-I joint pain and for bilateral hip joint pain.  Gait & Posture Assessment  Ambulation: Limited Gait: Antalgic Posture: Antalgic   Lower Extremity Exam    Side: Right lower extremity  Side: Left lower extremity  Inspection: No masses, redness, swelling, or asymmetry  Inspection: No masses, redness, swelling, or asymmetry  Functional ROM: Unrestricted ROM          Functional ROM: Unrestricted ROM          Muscle strength & Tone: Functionally intact  Muscle strength & Tone: Functionally intact  Sensory: Unimpaired  Sensory: Unimpaired  Palpation: Non-contributory  Palpation: Non-contributory   Assessment  Primary Diagnosis & Pertinent Problem List: The primary encounter diagnosis was Chronic left hip pain. Diagnoses of Primary osteoarthritis of both hips, Chronic low back pain (Location of Primary Source of Pain) (Bilateral) (L>R), Musculoskeletal pain, Neurogenic pain, Trigeminal herpes zoster, Trigeminal herpes zoster (left V1 distribution), Neuropathic pain, Hypomagnesemia, and Chronic pain syndrome were also pertinent to this visit.  Status Diagnosis  Worsening Worsening Worsening 1. Chronic left hip pain   2. Primary osteoarthritis of both hips   3. Chronic low back pain (Location of Primary Source of Pain) (Bilateral) (L>R)   4. Musculoskeletal pain   5. Neurogenic pain   6. Trigeminal herpes zoster   7. Trigeminal herpes zoster (left V1 distribution)   8. Neuropathic pain   9. Hypomagnesemia   10. Chronic pain syndrome      Plan of Care  Pharmacotherapy (Medications Ordered): Meds ordered this encounter  Medications  . tiZANidine (ZANAFLEX) 4 MG tablet    Sig: Take 1 tablet (4 mg total) by mouth 3 (three) times daily.    Dispense:  270 tablet    Refill:  1    Do not add this medication to the  electronic "Automatic Refill" notification system. Patient may have prescription filled one day early if pharmacy is closed on scheduled refill date.  . gabapentin (NEURONTIN) 800 MG tablet    Sig: Take 1 tablet (800 mg total) by mouth every 6 (six) hours.    Dispense:  360 tablet    Refill:  1    Do not place this medication, or any other prescription from our practice, on "Automatic Refill". Patient may have prescription filled one  day early if pharmacy is closed on scheduled refill date.  . Magnesium Oxide 500 MG CAPS    Sig: Take 1 capsule (500 mg total) by mouth daily.    Dispense:  90 capsule    Refill:  1    Do not place this medication, or any other prescription from our practice, on "Automatic Refill". Patient may have prescription filled one day early if pharmacy is closed on scheduled refill date.  Marland Kitchen oxyCODONE (OXY IR/ROXICODONE) 5 MG immediate release tablet    Sig: Take 1 tablet (5 mg total) by mouth every 8 (eight) hours.    Dispense:  90 tablet    Refill:  0    Do not place this medication, or any other prescription from our practice, on "Automatic Refill". Patient may have prescription filled one day early if pharmacy is closed on scheduled refill date. Do not fill until: 09/24/16 To last until: 10/24/16   New Prescriptions   OXYCODONE (OXY IR/ROXICODONE) 5 MG IMMEDIATE RELEASE TABLET    Take 1 tablet (5 mg total) by mouth every 8 (eight) hours.   Medications administered today: Ms. Ditullio had no medications administered during this visit. Lab-work, procedure(s), and/or referral(s): Orders Placed This Encounter  Procedures  . HIP INJECTION  . DG HIP UNILAT W OR W/O PELVIS 2-3 VIEWS LEFT  . DG Lumbar Spine Complete W/Bend  . DG HIP UNILAT W OR W/O PELVIS 2-3 VIEWS RIGHT   Imaging and/or referral(s): DG HIP UNILAT W OR W/O PELVIS 2-3 VIEWS LEFT DG LUMBAR SPINE COMPLETE W/BEND 6+V DG HIP UNILAT W OR W/O PELVIS 2-3 VIEWS RIGHT  Interventional therapies: Planned,  scheduled, and/or pending:   Diagnostic Left intra-articular hip joint injection (W/O STEROIDS)   Considering:   Diagnostic bilateral intra-articular hip joint injection under fluoroscopic guidance, with or without sedation.  Bilateral diagnostic lumbar facet block under fluoroscopic guidance and IV sedation.  Right L4-5 lumbar epidural steroid injection under fluoroscopic guidance, without without sedation.  Palliative bilateral L5-S1 transforaminal epidural steroid injection under fluoroscopic guidance and IV sedation.  Palliative caudal epidural steroid injection under fluoroscopic guidance, with or without sedation.    Palliative PRN treatment(s):   Diagnostic bilateral intra-articular hip joint injection under fluoroscopic guidance, with or without sedation.  Bilateral diagnostic lumbar facet block under fluoroscopic guidance and IV sedation.  Right L4-5 lumbar epidural steroid injection under fluoroscopic guidance, without without sedation.  Palliative bilateral L5-S1 transforaminal epidural steroid injection under fluoroscopic guidance and IV sedation.  Palliative caudal epidural steroid injection under fluoroscopic guidance, with or without sedation.    Provider-requested follow-up: Return in about 1 month (around 10/22/2016) for (MD) Med-Mgmt.  Future Appointments Date Time Provider [place]  10/14/2016 3:00 PM Denisa L O'Brien-Blaney, LPN [id number] None   Primary Care Physician: Leone Haven, MD Location: [facility] Note by: Kathlen Brunswick. Dossie Arbour, M.D, DABA, DABAPM, DABPM, DABIPP, FIPP Date: 09/24/2016; Time: 4:11 PM  Pain Score Disclaimer: We use the NRS-11 scale. This is a self-reported, subjective measurement of pain severity with only modest accuracy. It is used primarily to identify changes within a particular patient. It must be understood that outpatient pain scales are significantly less accurate that those used for research, where  they can be applied under ideal controlled circumstances with minimal exposure to variables. In reality, the score is likely to be a combination of pain intensity and pain affect, where pain affect describes the degree of emotional arousal or changes in action readiness caused by  the sensory experience of pain. Factors such as social and work situation, setting, emotional state, anxiety levels, expectation, and prior pain experience may influence pain perception and show large inter-individual differences that may also be affected by time variables.  Patient instructions provided during this appointment: Patient Instructions  Let us know if able to afford new prescription for pain.

## 2016-09-24 NOTE — Progress Notes (Signed)
Nursing Pain Medication Assessment:  Safety precautions to be maintained throughout the outpatient stay will include: orient to surroundings, keep bed in low position, maintain call bell within reach at all times, provide assistance with transfer out of bed and ambulation.  Medication Inspection Compliance: Tammy Acosta did not comply with our request to bring her pills to be counted. She was reminded that bringing the medication bottles, even when empty, is a requirement. Pill/Patch Count: None available to be counted. Bottle Appearance: No container available. Did not bring bottle(s) to appointment. Medication: None brought in. Filled Date: N/A Last Medication intake:  09/11/16   Pt brought back  Two Prescriptions- can not afford medications and has been weaning off

## 2016-09-24 NOTE — Patient Instructions (Signed)
Let us know if able to afford new prescription for pain.

## 2016-09-28 ENCOUNTER — Telehealth: Payer: Self-pay | Admitting: Pain Medicine

## 2016-09-28 NOTE — Telephone Encounter (Signed)
I called patient to set up her return appt, she does not want to do that as she cannot get the medications anyway they are $170 per script. I offered to set her up an appt to see about different medications and she said she cant pay the $50 copay so she will call to sched if she needs appt

## 2016-10-01 ENCOUNTER — Telehealth: Payer: Self-pay | Admitting: *Deleted

## 2016-10-14 ENCOUNTER — Ambulatory Visit: Payer: Medicare Other

## 2016-10-14 ENCOUNTER — Other Ambulatory Visit: Payer: Self-pay | Admitting: Family Medicine

## 2016-11-04 ENCOUNTER — Telehealth: Payer: Self-pay | Admitting: *Deleted

## 2016-11-06 ENCOUNTER — Ambulatory Visit (INDEPENDENT_AMBULATORY_CARE_PROVIDER_SITE_OTHER): Payer: Medicare Other | Admitting: Family Medicine

## 2016-11-06 ENCOUNTER — Encounter: Payer: Self-pay | Admitting: Family Medicine

## 2016-11-06 ENCOUNTER — Other Ambulatory Visit: Payer: Self-pay | Admitting: Radiology

## 2016-11-06 ENCOUNTER — Ambulatory Visit: Payer: Medicare Other

## 2016-11-06 VITALS — BP 116/84 | HR 95 | Temp 98.6°F | Resp 16 | Ht 61.0 in | Wt 158.0 lb

## 2016-11-06 DIAGNOSIS — E1142 Type 2 diabetes mellitus with diabetic polyneuropathy: Secondary | ICD-10-CM | POA: Diagnosis not present

## 2016-11-06 DIAGNOSIS — Z1239 Encounter for other screening for malignant neoplasm of breast: Secondary | ICD-10-CM

## 2016-11-06 DIAGNOSIS — R5383 Other fatigue: Secondary | ICD-10-CM | POA: Diagnosis not present

## 2016-11-06 DIAGNOSIS — T300 Burn of unspecified body region, unspecified degree: Secondary | ICD-10-CM | POA: Insufficient documentation

## 2016-11-06 DIAGNOSIS — M792 Neuralgia and neuritis, unspecified: Secondary | ICD-10-CM

## 2016-11-06 DIAGNOSIS — M545 Low back pain, unspecified: Secondary | ICD-10-CM

## 2016-11-06 DIAGNOSIS — Z0001 Encounter for general adult medical examination with abnormal findings: Secondary | ICD-10-CM

## 2016-11-06 DIAGNOSIS — B0222 Postherpetic trigeminal neuralgia: Secondary | ICD-10-CM

## 2016-11-06 DIAGNOSIS — Z0000 Encounter for general adult medical examination without abnormal findings: Secondary | ICD-10-CM

## 2016-11-06 DIAGNOSIS — M791 Myalgia: Secondary | ICD-10-CM

## 2016-11-06 DIAGNOSIS — M7918 Myalgia, other site: Secondary | ICD-10-CM

## 2016-11-06 DIAGNOSIS — G8929 Other chronic pain: Secondary | ICD-10-CM

## 2016-11-06 LAB — COMPREHENSIVE METABOLIC PANEL
ALK PHOS: 65 U/L (ref 39–117)
ALT: 9 U/L (ref 0–35)
AST: 12 U/L (ref 0–37)
Albumin: 4 g/dL (ref 3.5–5.2)
BILIRUBIN TOTAL: 0.1 mg/dL — AB (ref 0.2–1.2)
BUN: 20 mg/dL (ref 6–23)
CO2: 24 meq/L (ref 19–32)
Calcium: 9.5 mg/dL (ref 8.4–10.5)
Chloride: 106 mEq/L (ref 96–112)
Creatinine, Ser: 1.1 mg/dL (ref 0.40–1.20)
GFR: 53.55 mL/min — AB (ref >60.00)
GLUCOSE: 204 mg/dL — AB (ref 70–99)
Potassium: 4 mEq/L (ref 3.5–5.1)
SODIUM: 137 meq/L (ref 135–145)
TOTAL PROTEIN: 7.1 g/dL (ref 6.0–8.3)

## 2016-11-06 LAB — CBC WITH DIFFERENTIAL/PLATELET
Basophils Absolute: 0.1 10*3/uL (ref 0.0–0.1)
Basophils Relative: 0.6 % (ref 0.0–3.0)
Eosinophils Absolute: 0.2 10*3/uL (ref 0.0–0.7)
Eosinophils Relative: 2.1 % (ref 0.0–5.0)
HCT: 38.1 % (ref 36.0–46.0)
HEMOGLOBIN: 12.7 g/dL (ref 12.0–15.0)
Lymphocytes Relative: 41.4 % (ref 12.0–46.0)
Lymphs Abs: 3.6 10*3/uL (ref 0.7–4.0)
MCHC: 33.4 g/dL (ref 30.0–36.0)
MCV: 91.9 fl (ref 78.0–100.0)
MONOS PCT: 6 % (ref 3.0–12.0)
Monocytes Absolute: 0.5 10*3/uL (ref 0.1–1.0)
NEUTROS PCT: 49.9 % (ref 43.0–77.0)
Neutro Abs: 4.3 10*3/uL (ref 1.4–7.7)
PLATELETS: 288 10*3/uL (ref 150.0–400.0)
RBC: 4.14 Mil/uL (ref 3.87–5.11)
RDW: 14.9 % (ref 11.5–15.5)
WBC: 8.6 10*3/uL (ref 4.0–10.5)

## 2016-11-06 LAB — TSH: TSH: 1.98 u[IU]/mL (ref 0.35–4.50)

## 2016-11-06 LAB — HEMOGLOBIN A1C: Hgb A1c MFr Bld: 10.9 % — ABNORMAL HIGH (ref 4.6–6.5)

## 2016-11-06 LAB — VITAMIN B12: Vitamin B-12: 159 pg/mL — ABNORMAL LOW (ref 211–911)

## 2016-11-06 MED ORDER — GABAPENTIN 800 MG PO TABS: 800.0000 mg | ORAL_TABLET | Freq: Four times a day (QID) | ORAL | 1 refills | Status: DC

## 2016-11-06 MED ORDER — BUPROPION HCL ER (SMOKING DET) 150 MG PO TB12: ORAL_TABLET | ORAL | 3 refills | Status: DC

## 2016-11-06 MED ORDER — TIZANIDINE HCL 4 MG PO TABS: 4.0000 mg | ORAL_TABLET | Freq: Three times a day (TID) | ORAL | 1 refills | Status: DC

## 2016-11-06 MED ORDER — SILVER SULFADIAZINE 1 % EX CREA: 1.0000 "application " | TOPICAL_CREAM | Freq: Every day | CUTANEOUS | 0 refills | Status: DC

## 2016-11-06 NOTE — Assessment & Plan Note (Addendum)
Chronic issue. Has been relatively stable with the pain. Respond somewhat to gabapentin Zanaflex. Narcotics were not beneficial. She has 4+/5 strength in her left quadricep and hamstring which may be related to the discomfort that she has. Discussed given the strength change over the last 3 months that we should obtain an MRI or at least refer her to a surgeon to discuss further management though she reports she had an MRI last year which I cannot see and would prefer not to undergo another MRI or have a referral at this time. We will request her MRI report. She'll continue her current medications and do the exercises that were provided to her. If she has any worsening of this she will seek medical attention. Given return precautions.

## 2016-11-06 NOTE — Assessment & Plan Note (Signed)
Potentially could be related to uncontrolled diabetes versus her depression and lack of sleep. She'll continue her Zoloft. We'll check an A1c. We'll check other lab work as outlined below to evaluate this issue. She's been started on bupropion for smoking cessation and this will likely help with her depression as well. She'll continue to monitor this issue.

## 2016-11-06 NOTE — Assessment & Plan Note (Signed)
Uncontrolled. Needs an A1c checked. She'll have other lab work as outlined below. Continue current medications.

## 2016-11-06 NOTE — Progress Notes (Signed)
I have reviewed the above annual wellness visit and agree.  Marikay AlarEric Riot Waterworth, M.D.

## 2016-11-06 NOTE — Progress Notes (Signed)
Pre visit review using our clinic review tool, if applicable. No additional management support is needed unless otherwise documented below in the visit note. 
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
## 2016-11-06 NOTE — Patient Instructions (Addendum)
Nice to see you. We will check some lab work today to evaluate for a cause of your fatigue. We will start you on Wellbutrin for your smoking cessation. Please continue to check your blood sugars and continue your diabetes medicine. We will refill your gabapentin and Zanaflex. You need to keep the area in her low back cleansed with open water. Do not use a heating pad anymore. If you develop numbness, weakness, loss of bowel or bladder function, numbness between her legs, or any new or changing symptoms please seek medical attention immediately.   Back Exercises The following exercises strengthen the muscles that help to support the back. They also help to keep the lower back flexible. Doing these exercises can help to prevent back pain or lessen existing pain. If you have back pain or discomfort, try doing these exercises 2-3 times each day or as told by your health care provider. When the pain goes away, do them once each day, but increase the number of times that you repeat the steps for each exercise (do more repetitions). If you do not have back pain or discomfort, do these exercises once each day or as told by your health care provider. Exercises Single Knee to Chest   Repeat these steps 3-5 times for each leg: 1. Lie on your back on a firm bed or the floor with your legs extended. 2. Bring one knee to your chest. Your other leg should stay extended and in contact with the floor. 3. Hold your knee in place by grabbing your knee or thigh. 4. Pull on your knee until you feel a gentle stretch in your lower back. 5. Hold the stretch for 10-30 seconds. 6. Slowly release and straighten your leg. Pelvic Tilt   Repeat these steps 5-10 times: 1. Lie on your back on a firm bed or the floor with your legs extended. 2. Bend your knees so they are pointing toward the ceiling and your feet are flat on the floor. 3. Tighten your lower abdominal muscles to press your lower back against the floor.  This motion will tilt your pelvis so your tailbone points up toward the ceiling instead of pointing to your feet or the floor. 4. With gentle tension and even breathing, hold this position for 5-10 seconds. Cat-Cow   Repeat these steps until your lower back becomes more flexible: 1. Get into a hands-and-knees position on a firm surface. Keep your hands under your shoulders, and keep your knees under your hips. You may place padding under your knees for comfort. 2. Let your head hang down, and point your tailbone toward the floor so your lower back becomes rounded like the back of a cat. 3. Hold this position for 5 seconds. 4. Slowly lift your head and point your tailbone up toward the ceiling so your back forms a sagging arch like the back of a cow. 5. Hold this position for 5 seconds. Press-Ups   Repeat these steps 5-10 times: 1. Lie on your abdomen (face-down) on the floor. 2. Place your palms near your head, about shoulder-width apart. 3. While you keep your back as relaxed as possible and keep your hips on the floor, slowly straighten your arms to raise the top half of your body and lift your shoulders. Do not use your back muscles to raise your upper torso. You may adjust the placement of your hands to make yourself more comfortable. 4. Hold this position for 5 seconds while you keep your back relaxed. 5.  Slowly return to lying flat on the floor. Bridges   Repeat these steps 10 times: 1. Lie on your back on a firm surface. 2. Bend your knees so they are pointing toward the ceiling and your feet are flat on the floor. 3. Tighten your buttocks muscles and lift your buttocks off of the floor until your waist is at almost the same height as your knees. You should feel the muscles working in your buttocks and the back of your thighs. If you do not feel these muscles, slide your feet 1-2 inches farther away from your buttocks. 4. Hold this position for 3-5 seconds. 5. Slowly lower your hips  to the starting position, and allow your buttocks muscles to relax completely. If this exercise is too easy, try doing it with your arms crossed over your chest. Abdominal Crunches   Repeat these steps 5-10 times: 1. Lie on your back on a firm bed or the floor with your legs extended. 2. Bend your knees so they are pointing toward the ceiling and your feet are flat on the floor. 3. Cross your arms over your chest. 4. Tip your chin slightly toward your chest without bending your neck. 5. Tighten your abdominal muscles and slowly raise your trunk (torso) high enough to lift your shoulder blades a tiny bit off of the floor. Avoid raising your torso higher than that, because it can put too much stress on your low back and it does not help to strengthen your abdominal muscles. 6. Slowly return to your starting position. Back Lifts  Repeat these steps 5-10 times: 1. Lie on your abdomen (face-down) with your arms at your sides, and rest your forehead on the floor. 2. Tighten the muscles in your legs and your buttocks. 3. Slowly lift your chest off of the floor while you keep your hips pressed to the floor. Keep the back of your head in line with the curve in your back. Your eyes should be looking at the floor. 4. Hold this position for 3-5 seconds. 5. Slowly return to your starting position. Contact a health care provider if:  Your back pain or discomfort gets much worse when you do an exercise.  Your back pain or discomfort does not lessen within 2 hours after you exercise. If you have any of these problems, stop doing these exercises right away. Do not do them again unless your health care provider says that you can. Get help right away if:  You develop sudden, severe back pain. If this happens, stop doing the exercises right away. Do not do them again unless your health care provider says that you can. This information is not intended to replace advice given to you by your health care  provider. Make sure you discuss any questions you have with your health care provider. Document Released: 09/17/2004 Document Revised: 12/18/2015 Document Reviewed: 10/04/2014 Elsevier Interactive Patient Education  2017 [employer].   These are the goals we discussed: Goals    . Quit smoking / using tobacco       This is a list of the screening recommended for you and due dates:  Health Maintenance  Topic Date Due  .  Hepatitis C: One time screening is recommended by Center for Disease Control  (CDC) for  adults born from [birth year] through 1966.   Jan 16, 1956  . Complete foot exam   03/03/1965  . HIV Screening  03/03/1970  . Tetanus Vaccine  03/03/1974  . Pap Smear  03/03/1976  .  Mammogram  03/03/2005  . Colon Cancer Screening  12/12/2012  . Hemoglobin A1C  08/14/2016  . Flu Shot  11/21/2016*  . Eye exam for diabetics  02/05/2017  . Pneumococcal vaccine (2) 10/29/2020  *Topic was postponed. The date shown is not the original due date.

## 2016-11-06 NOTE — Progress Notes (Signed)
Subjective:   Tammy Acosta is a 61 y.o. female who presents for an Initial Medicare Annual Wellness Visit.  Review of Systems    No ROS.  Medicare Wellness Visit.  Cardiac Risk Factors include: advanced age (>16mn, >>58women)     Objective:    Today's Vitals   11/06/16 1450  BP: 116/84  Pulse: 95  Resp: 16  Temp: 98.6 F (37 C)  TempSrc: Oral  SpO2: 97%  Weight: 158 lb (71.7 kg)  Height: _0  (1.549 m)   Body mass index is 29.85 kg/m.   Current Medications (verified) Outpatient Encounter Prescriptions as of 11/06/2016  Medication Sig  . acetaminophen (RA ACETAMINOPHEN) 650 MG CR tablet Take 1,300 mg by mouth daily.   .Marland Kitchenaspirin EC 81 MG tablet Take 1 tablet (81 mg total) by mouth daily.  . blood glucose meter kit and supplies Dispense One touch meter.  E11.9  . Blood Glucose Monitoring Suppl (ONE TOUCH ULTRA 2) w/Device KIT   . cloNIDine (CATAPRES) 0.1 MG tablet take 1 tablet by mouth twice a day if needed  . gabapentin (NEURONTIN) 800 MG tablet Take 1 tablet (800 mg total) by mouth every 6 (six) hours.  .Marland KitchenglipiZIDE (GLUCOTROL) 5 MG tablet take 1 tablet by mouth every morning before BREAKFAST  . hydrochlorothiazide (HYDRODIURIL) 25 MG tablet Take 1 tablet (25 mg total) by mouth daily.  .Marland Kitchenlisinopril (PRINIVIL,ZESTRIL) 40 MG tablet Take 1 tablet (40 mg total) by mouth daily.  . Magnesium Oxide 500 MG CAPS Take 1 capsule (500 mg total) by mouth daily.  . metFORMIN (GLUCOPHAGE) 1000 MG tablet take 1 tablet by mouth twice a day with food  . simvastatin (ZOCOR) 40 MG tablet take 1 tablet by mouth every evening  . tiZANidine (ZANAFLEX) 4 MG tablet Take 1 tablet (4 mg total) by mouth 3 (three) times daily.  . [DISCONTINUED] gabapentin (NEURONTIN) 800 MG tablet Take 1 tablet (800 mg total) by mouth every 6 (six) hours.  . [DISCONTINUED] tiZANidine (ZANAFLEX) 4 MG tablet Take 1 tablet (4 mg total) by mouth 3 (three) times daily.  .Marland KitchenbuPROPion (ZYBAN) 150 MG 12 hr tablet  Take 150 mg (one tablet) by mouth daily for 3 days, then take 150 mg (one tablet) by mouth twice daily  . morphine (MS CONTIN) 15 MG 12 hr tablet Take 1 tablet (15 mg total) by mouth every 12 (twelve) hours.  . nitroGLYCERIN (NITROSTAT) 0.4 MG SL tablet Place 1 tablet (0.4 mg total) under the tongue every 5 (five) minutes as needed for chest pain. (Patient not taking: Reported on 11/06/2016)  . oxyCODONE (OXY IR/ROXICODONE) 5 MG immediate release tablet Take 1 tablet (5 mg total) by mouth every 8 (eight) hours.  . sertraline (ZOLOFT) 100 MG tablet Take 1 tablet (100 mg total) by mouth daily. (Patient not taking: Reported on 11/06/2016)  . silver sulfADIAZINE (SILVADENE) 1 % cream Apply 1 application topically daily.  . [DISCONTINUED] buPROPion (ZYBAN) 150 MG 12 hr tablet Take 150 mg (one tablet) by mouth daily for 3 days, then take 150 mg (one tablet) by mouth twice daily   No facility-administered encounter medications on file as of 11/06/2016.     Allergies (verified) Penicillins and Tetracyclines & related   History: Past Medical History:  Diagnosis Date  . Altered mental status 02/04/2014  . Arthritis   . Colon polyps   . Depression   . Diabetes mellitus without complication ([place]   . Hypercholesteremia   . Hyperlipidemia   .  Hypertension   . Shingles    10/17  . Spine disorder   . Viral upper respiratory illness 12/03/2015   Past Surgical History:  Procedure Laterality Date  . ABDOMINAL HYSTERECTOMY    . BACK SURGERY    . CHOLECYSTECTOMY    . KYPHOPLASTY     Family History  Problem Relation Age of Onset  . Heart disease Mother   . Hypertension Mother   . Cancer Mother     breast  . Heart disease Father   . Arthritis      Parent, other relative  . Colon cancer      Parent  . Breast cancer      Parent  . Lung cancer      Other relative  . Diabetes      Other relative  . Uterine cancer      Other relative  . Cancer Maternal Aunt     breast  . Cancer Maternal  Aunt     ovarian   Social History   Occupational History  . Not on file.   Social History Main Topics  . Smoking status: Current Every Day Smoker    Packs/day: 0.25    Years: 40.00    Types: Cigarettes  . Smokeless tobacco: Never Used     Comment: smokes when stressed "Not many"  . Alcohol use No  . Drug use: No  . Sexual activity: No    Tobacco Counseling Ready to quit: Not Answered Counseling given: Not Answered   Activities of Daily Living In your present state of health, do you have any difficulty performing the following activities: 11/06/2016  Hearing? N  Vision? N  Difficulty concentrating or making decisions? N  Walking or climbing stairs? N  Dressing or bathing? N  Doing errands, shopping? N  Preparing Food and eating ? N  Using the Toilet? N  In the past six months, have you accidently leaked urine? N  Do you have problems with loss of bowel control? N  Managing your Medications? N  Managing your Finances? N  Housekeeping or managing your Housekeeping? N  Some recent data might be hidden    Immunizations and Health Maintenance Immunization History  Administered Date(s) Administered  . Influenza,inj,Quad PF,36+ Mos 10/30/2015  . Pneumococcal Polysaccharide-23 10/30/2015   Health Maintenance Due  Topic Date Due  . Hepatitis C Screening  May 13, 1956  . FOOT EXAM  03/03/1965  . HIV Screening  03/03/1970  . TETANUS/TDAP  03/03/1974  . PAP SMEAR  03/03/1976  . MAMMOGRAM  03/03/2005  . COLONOSCOPY  12/12/2012  . HEMOGLOBIN A1C  08/14/2016    Patient Care Team: Leone Haven, MD as PCP - General (Family Medicine)  Indicate any recent Medical Services you may have received from other than [facility] providers in the past year (date may be approximate).     Assessment:   This is a routine wellness examination for Tammy Acosta. The goal of the wellness visit is to assist the patient how to close the gaps in care and create a preventative care plan for the  patient.   Osteoporosis risk reviewed.  Medications reviewed; taking without issues or barriers.  Safety issues reviewed; smoke detectors in the home. No firearms in the home.  Wears seatbelts when driving or riding with others. Patient does wear sunscreen or protective clothing when in direct sunlight. No violence in the home.  Patient is alert, normal appearance, oriented to person/place/and time. Correctly identified the president of the [place], recall  of 3/3 words, and performing simple calculations.  Patient displays appropriate judgement and can read correct time from watch face.  No new identified risk were noted.  No failures at ADL's or IADL's.   BMI- discussed the importance of a healthy diet, water intake and exercise. Educational material provided.   HTN- followed by PCP.  Dental- every six months.  Eye- Visual acuity not assessed per patient preference since they have regular follow up with the ophthalmologist.  Wears corrective lenses.  Sleep patterns- Sleeps 2-3 hours at night.  Wakes feeling fatigued.  Patient Concerns: None at this time. Follow up with PCP as needed.  Hearing/Vision screen Hearing Screening Comments: Patient is able to hear conversational tones without difficulty.  No issues reported.  Vision Screening Comments: Followed by [facility] Wears corrective lenses Last OV 01/2016 Visual acuity not assessed per patient preference since they have regular follow up with the ophthalmologist  Dietary issues and exercise activities discussed: Current Exercise Habits: Home exercise routine, Type of exercise: walking, Time (Minutes): 40, Frequency (Times/Week): 7, Weekly Exercise (Minutes/Week): 280, Intensity: Mild  Goals    . Quit smoking / using tobacco      Depression Screen PHQ 2/9 Scores 11/06/2016 09/24/2016 06/24/2016 03/30/2016 02/13/2016 01/15/2016 01/10/2016  PHQ - 2 Score 0 0 0 0 _0 PHQ- 9 Score - - - - _1 Exception  Documentation - - Patient refusal - - - -    Fall Risk Fall Risk  11/06/2016 09/24/2016 06/24/2016 03/30/2016 01/15/2016  Falls in the past year? Yes No No No No  Number falls in past yr: 1 - - - -  Risk for fall due to : (No Data) - - - -  Risk for fall due to (comments): L leg weakness - - - -  Follow up Falls prevention discussed - - - -    Cognitive Function: MMSE - Mini Mental State Exam 11/06/2016  Orientation to time 5  Orientation to Place 5  Registration 3  Attention/ Calculation 5  Recall 3  Language- name 2 objects 2  Language- repeat 1  Language- follow 3 step command 3  Language- read & follow direction 1  Write a sentence 1  Copy design 1  Total score 30        Screening Tests Health Maintenance  Topic Date Due  . Hepatitis C Screening  1956/07/04  . FOOT EXAM  03/03/1965  . HIV Screening  03/03/1970  . TETANUS/TDAP  03/03/1974  . PAP SMEAR  03/03/1976  . MAMMOGRAM  03/03/2005  . COLONOSCOPY  12/12/2012  . HEMOGLOBIN A1C  08/14/2016  . INFLUENZA VACCINE  11/21/2016 (Originally 03/24/2016)  . OPHTHALMOLOGY EXAM  02/05/2017  . PNEUMOCOCCAL POLYSACCHARIDE VACCINE (2) 10/29/2020      Plan:   End of life planning; Advanced aging; Advanced directives discussed.  No HCPOA/Living Will.  Additional information provided to help them start the conversation with family.  Copy of HCPOA/Living Will requested upon completion. Time spent on this topic is 18 minutes.  Medicare Attestation I have personally reviewed: The patient's medical and social history Their use of alcohol, tobacco or illicit drugs Their current medications and supplements The patient's functional ability including ADLs,fall risks, home safety risks, cognitive, and hearing and visual impairment Diet and physical activities Evidence for depression   The patient's weight, height, BMI, and visual acuity have been recorded in the chart.  I have made referrals and provided education to the patient based  on review of the above and I have provided the patient with a written personalized care plan for preventive services.    During the course of the visit, Tammy Acosta was educated and counseled about the following appropriate screening and preventive services:   Vaccines to include Pneumoccal, Influenza, Hepatitis B, Td, Zostavax, HCV  Colorectal cancer screening-due, educational material provided  Bone density screening-UTD  Diabetes-followed by PCP  Glaucoma screening-annual eye exam  Mammography-due/ordered  Nutrition counseling  Smoking cessation counseling-ready to quit, education provided  Patient Instructions (the written plan) were given to the patient.    Varney Biles, LPN   05/21/8003

## 2016-11-10 ENCOUNTER — Other Ambulatory Visit: Payer: Self-pay | Admitting: Family Medicine

## 2016-11-10 NOTE — Telephone Encounter (Signed)
Pt called requesting new rx's sent to her new pharmacy. Pt is needing metFORMIN (GLUCOPHAGE) 1000 MG tablet, lisinopril (PRINIVIL,ZESTRIL) 40 MG tablet, hydrochlorothiazide (HYDRODIURIL) 25 MG tablet, cloNIDine (CATAPRES) 0.1 MG tablet, and simvastatin (ZOCOR) 40 MG tablet. Please advise, thank you!  Pharmacy - [id number] - [place], [place] - [address]  Call pt @ [phone]

## 2016-11-11 MED ORDER — SIMVASTATIN 40 MG PO TABS: 40.0000 mg | ORAL_TABLET | Freq: Every evening | ORAL | 0 refills | Status: DC

## 2016-11-11 MED ORDER — METFORMIN HCL 1000 MG PO TABS: 1000.0000 mg | ORAL_TABLET | Freq: Two times a day (BID) | ORAL | 3 refills | Status: DC

## 2016-11-11 MED ORDER — HYDROCHLOROTHIAZIDE 25 MG PO TABS: 25.0000 mg | ORAL_TABLET | Freq: Every day | ORAL | 3 refills | Status: DC

## 2016-11-11 MED ORDER — CLONIDINE HCL 0.1 MG PO TABS: ORAL_TABLET | ORAL | 1 refills | Status: DC

## 2016-11-11 MED ORDER — LISINOPRIL 40 MG PO TABS: 40.0000 mg | ORAL_TABLET | Freq: Every day | ORAL | 3 refills | Status: DC

## 2016-11-11 NOTE — Telephone Encounter (Signed)
Last OV 11/06/16 last filled Metformin 07/10/16 60 3rf Simvastatin 10/14/16 90 0rf was sent to wrong pharmacy Hydrochlorothiazide 10/30/15 90 3rf Klonopin 08/12/16 60 1rf Lisinopril 12/03/15 90 3rf

## 2016-11-13 ENCOUNTER — Encounter: Payer: Self-pay | Admitting: Family Medicine

## 2016-11-13 ENCOUNTER — Ambulatory Visit (INDEPENDENT_AMBULATORY_CARE_PROVIDER_SITE_OTHER): Payer: Medicare Other | Admitting: Family Medicine

## 2016-11-13 VITALS — BP 92/62 | HR 77 | Temp 98.6°F | Wt 162.2 lb

## 2016-11-13 DIAGNOSIS — T300 Burn of unspecified body region, unspecified degree: Secondary | ICD-10-CM

## 2016-11-13 DIAGNOSIS — I10 Essential (primary) hypertension: Secondary | ICD-10-CM | POA: Diagnosis not present

## 2016-11-13 DIAGNOSIS — E538 Deficiency of other specified B group vitamins: Secondary | ICD-10-CM

## 2016-11-13 DIAGNOSIS — E1142 Type 2 diabetes mellitus with diabetic polyneuropathy: Secondary | ICD-10-CM | POA: Diagnosis not present

## 2016-11-13 MED ORDER — CYANOCOBALAMIN 1000 MCG/ML IJ SOLN
1000.0000 ug | Freq: Once | INTRAMUSCULAR | Status: AC
  Administered 2016-11-13: 1000 ug via INTRAMUSCULAR

## 2016-11-13 NOTE — Assessment & Plan Note (Addendum)
Uncontrolled. Discussed adding insulin or an oral medication though she would prefer to work on her diet and exercise and follow up in 3 months to have it rechecked. Discussed dietary changes.

## 2016-11-13 NOTE — Assessment & Plan Note (Signed)
Appears to be well-healing. Discussed continuing Silvadene cream and that this would help reduce her risk of infection. She'll continue to monitor and if not continuing to continuing to improve she will be reevaluated.

## 2016-11-13 NOTE — Addendum Note (Signed)
Addended by: Inetta FermoHENDRICKS, JESSICA S on: 11/13/2016 01:47 PM   Modules accepted: Orders

## 2016-11-13 NOTE — Assessment & Plan Note (Signed)
BP borderline low today. Patient has been getting somewhat lightheaded. No syncopal episodes. We will have her taper off of her clonidine and monitor her blood pressure. She'll return in 2 weeks to have it rechecked.

## 2016-11-13 NOTE — Patient Instructions (Signed)
Nice to see you. We are going to taper you office or clonidine. She will take 0.1 mg once daily for 7 days and then discontinue. We'll continue your other blood pressure medicines. He'll continue to monitor your blood pressure at home. If your blood pressure does not come up to at least greater than 100/60 with this decreased dose and tapering off please let us know. If you have persistent lightheadedness please let us know as well. Please work on diet and exercise. Please continue the cream for her burn until it has healed.  If you develop persistent lightheadedness, passing out, have fevers or drainage from the burn, or have any new symptoms please seek medical attention.

## 2016-11-13 NOTE — Progress Notes (Signed)
  Marikay AlarEric Kaylani Fromme, MD Phone: [phone]  Oliver HumDeborah H Howden is a 61 y.o. female who presents today for follow-up.  Patient noted to have a burn last visit on her low back from using a heating pad. Notes she has not been using a heating pad and it is not as tender. The cream that was prescribed she has been using twice daily and has been beneficial. She notes no fevers or drainage.  Diabetes: A1c was worsened at last visit. She has been having increased urination and thirst. He had been taking her glipizide and metformin though had not been eating well. She was eating lots of carbohydrates and milk gravy. She is eating more baked foods and less carbs. No soft drinks. Does walk her dog for 45 minutes 3 times a day.  She has been on clonidine twice daily, HCTZ, and lisinopril for her blood pressure. She notes intermittent lightheadedness at times typically when standing up. She notes no syncopal episodes with this. Her blood pressure today was found to be 92/62.  PMH: smoker   ROS see history of present illness  Objective  Physical Exam Vitals:   11/13/16 1303  BP: 92/62  Pulse: 77  Temp: 98.6 F (37 C)    BP Readings from Last 3 Encounters:  11/13/16 92/62  11/06/16 116/84  09/24/16 (!) 184/99   Wt Readings from Last 3 Encounters:  11/13/16 162 lb 3.2 oz (73.6 kg)  11/06/16 158 lb (71.7 kg)  09/24/16 160 lb (72.6 kg)    Physical Exam  Constitutional: No distress.  Cardiovascular: Normal rate, regular rhythm and normal heart sounds.   Pulmonary/Chest: Effort normal and breath sounds normal.  Musculoskeletal: She exhibits no edema.  Neurological: She is alert. Gait normal.  Skin: Skin is warm and dry. She is not diaphoretic.  Scattered patches of burns on lower back appears to be well-healingno surrounding erythema or drainage or tenderness      Assessment/Plan: Please see individual problem list.  Essential (primary) hypertension BP borderline low today. Patient has  been getting somewhat lightheaded. No syncopal episodes. We will have her taper off of her clonidine and monitor her blood pressure. She'll return in 2 weeks to have it rechecked.   Type 2 diabetes mellitus (HCC) Uncontrolled. Discussed adding insulin or an oral medication though she would prefer to work on her diet and exercise and follow up in 3 months to have it rechecked. Discussed dietary changes.  Superficial burn Appears to be well-healing. Discussed continuing Silvadene cream and that this would help reduce her risk of infection. She'll continue to monitor and if not continuing to continuing to improve she will be reevaluated.  Marikay AlarEric Evony Rezek, MD [facility]

## 2016-11-13 NOTE — Progress Notes (Signed)
Pre visit review using our clinic review tool, if applicable. No additional management support is needed unless otherwise documented below in the visit note. 

## 2016-11-19 ENCOUNTER — Ambulatory Visit: Payer: Medicare Other

## 2016-11-23 ENCOUNTER — Telehealth: Payer: Self-pay | Admitting: Family Medicine

## 2016-11-23 MED ORDER — ONETOUCH ULTRASOFT LANCETS MISC: 12 refills | Status: DC

## 2016-11-23 MED ORDER — GLUCOSE BLOOD VI STRP: ORAL_STRIP | 12 refills | Status: DC

## 2016-11-23 NOTE — Telephone Encounter (Signed)
Sent to pharmacy 

## 2016-11-23 NOTE — Telephone Encounter (Signed)
Pt called requesting refills on her test strips and lancets. Please advise, thank you!  Call pt @ [phone]  Pharmacy - [id number] - [place], [place] - [address]

## 2016-11-25 ENCOUNTER — Ambulatory Visit (INDEPENDENT_AMBULATORY_CARE_PROVIDER_SITE_OTHER): Payer: Medicare Other

## 2016-11-25 VITALS — BP 133/86 | HR 89 | Resp 16

## 2016-11-25 DIAGNOSIS — E538 Deficiency of other specified B group vitamins: Secondary | ICD-10-CM

## 2016-11-25 DIAGNOSIS — I10 Essential (primary) hypertension: Secondary | ICD-10-CM

## 2016-11-25 NOTE — Progress Notes (Signed)
Patient comes in for  2 nd B 12 injection and blood pressure check. .  She has discontinued clonidine as instructed since last visit.     Injected right deltoid.  Patient tolerated injection well.    Patient states she has been monitoring blood pressure at home blood pressure was 179/101 on 11/24/16 , then re checked 154/84.  She states she had just got done walking her dog and had not rested before checking blood pressure.  Today checked blood pressure 159/73 at 1200 pm.   She states she has been feeling fine no dizziness, chest pain, or headache.   I advised patient prior to checking blood pressure she needs to sit and rest at least for 5 minutes.  She will continue to monitor her blood pressure at home and call us if readings are high above 130/90.  Per verbal orders from Dr Birdie Sons patient needs to bring her home blood pressure machine and can compare to our readings when she gets next B 12 injection.   Please advise.

## 2016-11-26 ENCOUNTER — Other Ambulatory Visit: Payer: Self-pay | Admitting: Family Medicine

## 2016-11-26 DIAGNOSIS — R928 Other abnormal and inconclusive findings on diagnostic imaging of breast: Secondary | ICD-10-CM

## 2016-11-26 MED ORDER — CYANOCOBALAMIN 1000 MCG/ML IJ SOLN
1000.0000 ug | Freq: Once | INTRAMUSCULAR | Status: AC
  Administered 2016-11-25: 1000 ug via INTRAMUSCULAR

## 2016-11-26 NOTE — Progress Notes (Signed)
I have reviewed the above note and agree. Patient with decently well-controlled blood pressures in the office. Agree with checking blood pressures at home and bringing in her monitor next week when she comes in for B12 injection. She was advised to contact us if blood pressures consistently greater than 130/90.  [name], M.D.

## 2016-11-30 ENCOUNTER — Encounter: Payer: Self-pay | Admitting: Family Medicine

## 2016-12-02 ENCOUNTER — Ambulatory Visit: Payer: Medicare Other

## 2016-12-09 ENCOUNTER — Ambulatory Visit (INDEPENDENT_AMBULATORY_CARE_PROVIDER_SITE_OTHER): Payer: Medicare Other | Admitting: *Deleted

## 2016-12-09 ENCOUNTER — Other Ambulatory Visit: Payer: Self-pay | Admitting: Family Medicine

## 2016-12-09 DIAGNOSIS — E538 Deficiency of other specified B group vitamins: Secondary | ICD-10-CM

## 2016-12-09 DIAGNOSIS — I10 Essential (primary) hypertension: Secondary | ICD-10-CM

## 2016-12-09 MED ORDER — CYANOCOBALAMIN 1000 MCG/ML IJ SOLN
1000.0000 ug | Freq: Once | INTRAMUSCULAR | Status: AC
  Administered 2016-12-09: 1000 ug via INTRAMUSCULAR

## 2016-12-09 NOTE — Progress Notes (Signed)
Patient presented for B 12 injection to left deltoid , patient voiced no concerns or showed any sign of distress during injection.  Patient also presented for BP check and comparison with home cuff, patient no longer has working cuff but BP was attained to nursing standards Left arm 118/88 pulse 91 and right arm was attained after adequate wait time , right arm BP 124/84 pulse 89.  Patient current medications for BP HCTZ 25 mg and Lisinopril 40 mg.

## 2016-12-10 ENCOUNTER — Ambulatory Visit: Admission: RE | Admit: 2016-12-10 | Payer: Medicare Other | Source: Ambulatory Visit

## 2016-12-10 ENCOUNTER — Other Ambulatory Visit: Payer: Medicare Other

## 2016-12-10 NOTE — Progress Notes (Signed)
Patient notified and voiced understanding.

## 2016-12-10 NOTE — Progress Notes (Signed)
I have reviewed the above note and agree. BP well controlled. She should continue current medications and start monitoring her blood pressure at home again.  [name], M.D.

## 2016-12-18 ENCOUNTER — Ambulatory Visit: Payer: Medicare Other | Admitting: Family Medicine

## 2017-01-06 ENCOUNTER — Other Ambulatory Visit: Payer: Self-pay

## 2017-01-06 MED ORDER — SIMVASTATIN 40 MG PO TABS: 40.0000 mg | ORAL_TABLET | Freq: Every evening | ORAL | 1 refills | Status: DC

## 2017-01-06 MED ORDER — GLIPIZIDE 5 MG PO TABS: ORAL_TABLET | ORAL | 1 refills | Status: DC

## 2017-01-12 ENCOUNTER — Ambulatory Visit (INDEPENDENT_AMBULATORY_CARE_PROVIDER_SITE_OTHER): Payer: Medicare Other

## 2017-01-12 DIAGNOSIS — E538 Deficiency of other specified B group vitamins: Secondary | ICD-10-CM

## 2017-01-12 MED ORDER — CYANOCOBALAMIN 1000 MCG/ML IJ SOLN
1000.0000 ug | Freq: Once | INTRAMUSCULAR | Status: AC
  Administered 2017-01-12: 1000 ug via INTRAMUSCULAR

## 2017-01-12 NOTE — Progress Notes (Signed)
Patient came in for a b12 injection.  Patient received injection in Right deltoid.  Patient tolerated well.

## 2017-01-12 NOTE — Progress Notes (Signed)
I have reviewed the above note and agree.  Sayf Kerner, M.D.  

## 2017-02-15 ENCOUNTER — Ambulatory Visit (INDEPENDENT_AMBULATORY_CARE_PROVIDER_SITE_OTHER): Payer: Medicare Other | Admitting: Family Medicine

## 2017-02-15 ENCOUNTER — Encounter: Payer: Self-pay | Admitting: Family Medicine

## 2017-02-15 VITALS — BP 160/88 | HR 89 | Temp 98.7°F | Wt 160.4 lb

## 2017-02-15 DIAGNOSIS — E1142 Type 2 diabetes mellitus with diabetic polyneuropathy: Secondary | ICD-10-CM

## 2017-02-15 DIAGNOSIS — I10 Essential (primary) hypertension: Secondary | ICD-10-CM | POA: Diagnosis not present

## 2017-02-15 DIAGNOSIS — F329 Major depressive disorder, single episode, unspecified: Secondary | ICD-10-CM

## 2017-02-15 DIAGNOSIS — Z720 Tobacco use: Secondary | ICD-10-CM

## 2017-02-15 DIAGNOSIS — F32A Depression, unspecified: Secondary | ICD-10-CM

## 2017-02-15 DIAGNOSIS — M545 Low back pain, unspecified: Secondary | ICD-10-CM

## 2017-02-15 DIAGNOSIS — E538 Deficiency of other specified B group vitamins: Secondary | ICD-10-CM

## 2017-02-15 DIAGNOSIS — F419 Anxiety disorder, unspecified: Secondary | ICD-10-CM

## 2017-02-15 DIAGNOSIS — G8929 Other chronic pain: Secondary | ICD-10-CM

## 2017-02-15 LAB — POCT GLYCOSYLATED HEMOGLOBIN (HGB A1C): HEMOGLOBIN A1C: 7.6

## 2017-02-15 MED ORDER — CYANOCOBALAMIN 1000 MCG/ML IJ SOLN
1000.0000 ug | Freq: Once | INTRAMUSCULAR | Status: AC
  Administered 2017-02-15: 1000 ug via INTRAMUSCULAR

## 2017-02-15 NOTE — Assessment & Plan Note (Signed)
Chronic issue. Has been relatively stable. She'll continue gabapentin and Zanaflex. She'll continue to monitor. Handicap form filled out.

## 2017-02-15 NOTE — Assessment & Plan Note (Signed)
Has improved with addition of Wellbutrin. She'll continue this and Zoloft.

## 2017-02-15 NOTE — Assessment & Plan Note (Signed)
Improved control. Continue current medications. Check A1c.

## 2017-02-15 NOTE — Patient Instructions (Signed)
Nice to see you. We will contact you with your A1c result. Please continue to monitor your blood pressure and if it is consistently greater than 130/90 please let us know. Please continue to monitor your blood sugars. Please continue the Zoloft and Wellbutrin. If you develop thoughts of harming herself or you develop numbness, weakness, loss of bowel or bladder function, numbness in legs, or any new or changing symptoms please seek medical attention immediately.

## 2017-02-15 NOTE — Assessment & Plan Note (Signed)
Smoking has decreased to a couple of cigarettes monthly. Encouraged her to quit.

## 2017-02-15 NOTE — Progress Notes (Signed)
  Tommi Rumps, MD Phone: [phone]  Tammy Acosta is a 61 y.o. female who presents today for f/u  HYPERTENSION  Disease Monitoring  Home BP Monitoring 126/80, typically in this range when checking outside of the office Chest pain- no    Dyspnea- no Medications  Compliance-  taking lisinopril, HCTZ.  Edema- no  DIABETES Disease Monitoring: Blood Sugar ranges-120-180 Polyuria/phagia/dipsia- no      Medications: Compliance- taking glipizide, metformin Hypoglycemic symptoms- no  Depression: Patient notes this is significantly better. Currently taking Zoloft and Wellbutrin. No SI. Notes she is smoking significantly less since starting on Wellbutrin.  Chronic low back pain. Notes hurts mostly when she is up walking around. Does have spinal stenosis. Does not want to take any chronic narcotics for this. Gabapentin and Zanaflex do help. Occasionally feels as though her legs are weak. No numbness. No loss of bowel or bladder function. No saddle anesthesia.   PMH: Smoker   ROS see history of present illness  Objective  Physical Exam Vitals:   02/15/17 1541  BP: (!) 160/88  Pulse: 89  Temp: 98.7 F (37.1 C)    BP Readings from Last 3 Encounters:  02/15/17 (!) 160/88  11/25/16 133/86  11/13/16 92/62   Wt Readings from Last 3 Encounters:  02/15/17 160 lb 6.4 oz (72.8 kg)  11/13/16 162 lb 3.2 oz (73.6 kg)  11/06/16 158 lb (71.7 kg)    Physical Exam  Constitutional: No distress.  Cardiovascular: Normal rate, regular rhythm and normal heart sounds.   Pulmonary/Chest: Effort normal and breath sounds normal.  Musculoskeletal:  No midline spine tenderness, no midline spine step-off, mild muscular back tenderness in her lumbar spine  Neurological: She is alert. Gait normal.  5/5 strength bilateral quads, hamstrings, plantar flexion, and dorsiflexion, sensation light touch intact bilaterally lower extremities  Skin: She is not diaphoretic.     Assessment/Plan:  Please see individual problem list.  Essential (primary) hypertension At goal at home. Elevated today. Given that it has been at goal at home we will not make any changes to her medications. We will check a BMP. We'll have her return in 1 week for recheck. She'll continue to monitor at home.  Type 2 diabetes mellitus (HCC) Improved control. Continue current medications. Check A1c.  Chronic low back pain (Location of Primary Source of Pain) (Bilateral) (L>R) Chronic issue. Has been relatively stable. She'll continue gabapentin and Zanaflex. She'll continue to monitor. Handicap form filled out.  Anxiety and depression Has improved with addition of Wellbutrin. She'll continue this and Zoloft.  Tobacco abuse Smoking has decreased to a couple of cigarettes monthly. Encouraged her to quit.   Orders Placed This Encounter  Procedures  . Comp Met (CMET)  . POCT HgB A1C    Meds ordered this encounter  Medications  . cyanocobalamin ((VITAMIN B-12)) injection 1,000 mcg   Tommi Rumps, MD [place]

## 2017-02-15 NOTE — Assessment & Plan Note (Signed)
At goal at home. Elevated today. Given that it has been at goal at home we will not make any changes to her medications. We will check a BMP. We'll have her return in 1 week for recheck. She'll continue to monitor at home.

## 2017-02-16 LAB — COMPREHENSIVE METABOLIC PANEL
ALBUMIN: 4 g/dL (ref 3.5–5.2)
ALT: 8 U/L (ref 0–35)
AST: 10 U/L (ref 0–37)
Alkaline Phosphatase: 62 U/L (ref 39–117)
BUN: 14 mg/dL (ref 6–23)
CALCIUM: 9.5 mg/dL (ref 8.4–10.5)
CHLORIDE: 103 meq/L (ref 96–112)
CO2: 30 mEq/L (ref 19–32)
CREATININE: 1.26 mg/dL — AB (ref 0.40–1.20)
GFR: 45.74 mL/min — ABNORMAL LOW (ref >60.00)
Glucose, Bld: 207 mg/dL — ABNORMAL HIGH (ref 70–99)
Potassium: 4.7 mEq/L (ref 3.5–5.1)
Sodium: 139 mEq/L (ref 135–145)
Total Bilirubin: 0.1 mg/dL — ABNORMAL LOW (ref 0.2–1.2)
Total Protein: 6.9 g/dL (ref 6.0–8.3)

## 2017-02-17 ENCOUNTER — Other Ambulatory Visit: Payer: Self-pay | Admitting: Family Medicine

## 2017-02-17 DIAGNOSIS — N179 Acute kidney failure, unspecified: Secondary | ICD-10-CM

## 2017-02-17 MED ORDER — GLIPIZIDE 5 MG PO TABS: 5.0000 mg | ORAL_TABLET | Freq: Two times a day (BID) | ORAL | 1 refills | Status: DC

## 2017-02-23 ENCOUNTER — Other Ambulatory Visit: Payer: Medicare Other

## 2017-02-25 ENCOUNTER — Other Ambulatory Visit: Payer: Medicare Other

## 2017-02-26 ENCOUNTER — Other Ambulatory Visit: Payer: Medicare Other

## 2017-03-10 ENCOUNTER — Ambulatory Visit (INDEPENDENT_AMBULATORY_CARE_PROVIDER_SITE_OTHER): Payer: Medicare Other | Admitting: *Deleted

## 2017-03-10 ENCOUNTER — Other Ambulatory Visit: Payer: Self-pay | Admitting: *Deleted

## 2017-03-10 ENCOUNTER — Other Ambulatory Visit (INDEPENDENT_AMBULATORY_CARE_PROVIDER_SITE_OTHER): Payer: Medicare Other

## 2017-03-10 VITALS — BP 160/88 | HR 75 | Resp 18

## 2017-03-10 DIAGNOSIS — I10 Essential (primary) hypertension: Secondary | ICD-10-CM

## 2017-03-10 DIAGNOSIS — N179 Acute kidney failure, unspecified: Secondary | ICD-10-CM

## 2017-03-10 LAB — BASIC METABOLIC PANEL
BUN: 14 mg/dL (ref 6–23)
CALCIUM: 9.5 mg/dL (ref 8.4–10.5)
CHLORIDE: 102 meq/L (ref 96–112)
CO2: 26 meq/L (ref 19–32)
Creatinine, Ser: 1.12 mg/dL (ref 0.40–1.20)
GFR: 52.39 mL/min — ABNORMAL LOW (ref >60.00)
GLUCOSE: 195 mg/dL — AB (ref 70–99)
POTASSIUM: 4.3 meq/L (ref 3.5–5.1)
SODIUM: 137 meq/L (ref 135–145)

## 2017-03-10 MED ORDER — GLIPIZIDE 5 MG PO TABS: 5.0000 mg | ORAL_TABLET | Freq: Two times a day (BID) | ORAL | 1 refills | Status: DC

## 2017-03-10 NOTE — Progress Notes (Signed)
Patient came into office for 1 week BP re-check BP was attained in left arm only due to veinous puncture to right arm in antecubital spot. Left BP 160/88 pulse 75.

## 2017-03-11 ENCOUNTER — Other Ambulatory Visit: Payer: Self-pay | Admitting: Family Medicine

## 2017-03-11 NOTE — Progress Notes (Signed)
Needs additional medication.  Defer to PCP upon return.   Everlene OtherJayce Corbet Hanley DO [facility] [place]

## 2017-03-12 ENCOUNTER — Telehealth: Payer: Self-pay | Admitting: Family Medicine

## 2017-03-12 MED ORDER — SERTRALINE HCL 100 MG PO TABS: 100.0000 mg | ORAL_TABLET | Freq: Every day | ORAL | 1 refills | Status: DC

## 2017-03-12 NOTE — Telephone Encounter (Signed)
Sent to pharmacy 

## 2017-03-12 NOTE — Telephone Encounter (Addendum)
Refill sertraline (ZOLOFT) 100 MG tablet ,[facility] [address]. Patient out of medication.

## 2017-03-15 NOTE — Progress Notes (Signed)
Blood pressure remains elevated. She needs an additional medication. I can send in amlodipine once you speak with her. Thanks.

## 2017-03-16 ENCOUNTER — Other Ambulatory Visit: Payer: Self-pay | Admitting: *Deleted

## 2017-03-16 DIAGNOSIS — M792 Neuralgia and neuritis, unspecified: Secondary | ICD-10-CM

## 2017-03-16 DIAGNOSIS — B0222 Postherpetic trigeminal neuralgia: Secondary | ICD-10-CM

## 2017-03-16 NOTE — Progress Notes (Signed)
Patient notified she is willing to try Amlodipine fo rBP but ask only 30 day supply be sent pharmacy refills ok.

## 2017-03-16 NOTE — Progress Notes (Signed)
I apologize, there is an interaction between simvastatin and amlodipine that could lead to muscle breakdown. We could try carvedilol if she is willing. Thanks.

## 2017-03-16 NOTE — Progress Notes (Signed)
Set pharmacy for [facility] patient requested.

## 2017-03-17 NOTE — Progress Notes (Signed)
Tried to reach patient by phone no answer and [patient has a voice mail that has not been setup.

## 2017-03-18 ENCOUNTER — Ambulatory Visit: Payer: Medicare Other

## 2017-03-18 MED ORDER — CARVEDILOL 3.125 MG PO TABS: 3.1250 mg | ORAL_TABLET | Freq: Two times a day (BID) | ORAL | 3 refills | Status: DC

## 2017-03-18 NOTE — Addendum Note (Signed)
Addended by: Glori LuisSONNENBERG, Nishawn Rotan G on: 03/18/2017 04:49 PM   Modules accepted: Orders

## 2017-03-18 NOTE — Progress Notes (Signed)
Carvedilol sent to [facility].

## 2017-03-18 NOTE — Progress Notes (Signed)
Notified patient and ask if she was willing to try Carvedilol she stated no at first due to expense, advised patient that [facility] and she is willing to try if script written for  30 days only. May have to put in note to pharmacy request 30 day supply only, because [facility] requesting 90.

## 2017-03-24 ENCOUNTER — Ambulatory Visit: Payer: Medicare Other

## 2017-04-13 ENCOUNTER — Other Ambulatory Visit: Payer: Self-pay | Admitting: Family Medicine

## 2017-05-12 ENCOUNTER — Other Ambulatory Visit: Payer: Self-pay | Admitting: Family Medicine

## 2017-05-14 ENCOUNTER — Other Ambulatory Visit: Payer: Self-pay | Admitting: *Deleted

## 2017-05-14 DIAGNOSIS — M7918 Myalgia, other site: Secondary | ICD-10-CM

## 2017-05-14 NOTE — Telephone Encounter (Signed)
Pt requested a medication refill for tizanidine  Pharmacy [facility] [place]

## 2017-05-14 NOTE — Telephone Encounter (Signed)
Pharmacy called back to follow up on the pt refill. Thank you!

## 2017-05-17 MED ORDER — TIZANIDINE HCL 4 MG PO TABS: 4.0000 mg | ORAL_TABLET | Freq: Three times a day (TID) | ORAL | 1 refills | Status: DC

## 2017-05-17 NOTE — Telephone Encounter (Signed)
Last OV 02/15/17 last filled 11/06/16 270 1rf

## 2017-05-18 ENCOUNTER — Ambulatory Visit: Payer: Medicare Other | Admitting: Family Medicine

## 2017-06-08 ENCOUNTER — Other Ambulatory Visit: Payer: Self-pay | Admitting: Family Medicine

## 2017-06-18 ENCOUNTER — Other Ambulatory Visit: Payer: Self-pay | Admitting: Family Medicine

## 2017-06-18 DIAGNOSIS — B0222 Postherpetic trigeminal neuralgia: Secondary | ICD-10-CM

## 2017-06-18 DIAGNOSIS — M792 Neuralgia and neuritis, unspecified: Secondary | ICD-10-CM

## 2017-06-18 NOTE — Telephone Encounter (Signed)
Last OV 02/15/17 last filled 11/06/16 360 1rf

## 2017-06-18 NOTE — Telephone Encounter (Signed)
Pt needs refill on gabapentin (NEURONTIN) 800 MG tablet [facility] [address] [place]

## 2017-06-19 MED ORDER — GABAPENTIN 800 MG PO TABS: 800.0000 mg | ORAL_TABLET | Freq: Four times a day (QID) | ORAL | 1 refills | Status: DC

## 2017-07-01 ENCOUNTER — Encounter: Payer: Self-pay | Admitting: Family Medicine

## 2017-07-01 ENCOUNTER — Ambulatory Visit (INDEPENDENT_AMBULATORY_CARE_PROVIDER_SITE_OTHER): Payer: Medicare Other | Admitting: Family Medicine

## 2017-07-01 VITALS — BP 160/100 | HR 76 | Temp 98.3°F | Wt 165.0 lb

## 2017-07-01 DIAGNOSIS — Z23 Encounter for immunization: Secondary | ICD-10-CM

## 2017-07-01 DIAGNOSIS — E1142 Type 2 diabetes mellitus with diabetic polyneuropathy: Secondary | ICD-10-CM | POA: Diagnosis not present

## 2017-07-01 DIAGNOSIS — F419 Anxiety disorder, unspecified: Secondary | ICD-10-CM

## 2017-07-01 DIAGNOSIS — F329 Major depressive disorder, single episode, unspecified: Secondary | ICD-10-CM

## 2017-07-01 DIAGNOSIS — I10 Essential (primary) hypertension: Secondary | ICD-10-CM | POA: Diagnosis not present

## 2017-07-01 NOTE — Assessment & Plan Note (Signed)
Uncontrolled.  Patient on max dose hydrochlorothiazide and lisinopril.  She is unable to afford any additional medications at this time.  She will contact us when she can afford medication.

## 2017-07-01 NOTE — Assessment & Plan Note (Signed)
Currently on gabapentin.  She tried to come off of this previously and had 2 episodes that potentially could have been seizures versus withdrawal symptoms.  I discussed this at length with the patient.  Discussed given the concern for seizures that she should not drive and advised of the [place] policy preventing driving for 6 months after a seizure episode.  We will submit DMV paperwork advising them of this.  She will discuss with her son as she does not have anybody to transport her where she currently lives.  She will not discontinue the gabapentin.  I advised neurology evaluation though she declined given that she cannot afford this at this time.

## 2017-07-01 NOTE — Patient Instructions (Signed)
Nice to you. I would advise that you do not drive given your possible seizure when you came off the gabapentin.  You will need to not drive seizure-free for 6 months.  I would suggest seeing neurology.  Please let us know if you would like to do that. When you are able to afford an additional blood pressure medication please let us know. Please check and see how many Zoloft tablets you have at home and let us know so we can advise on dosing.

## 2017-07-01 NOTE — Assessment & Plan Note (Signed)
Slightly worse since coming off the Wellbutrin.  No SI or HI.  Discussed continuing Zoloft.  Could consider increasing the dose though she is not able to afford any medication right now.  She will let us know how many tablets she has on hand at home to determine if the dosage can be changed prior to her getting paid so she can afford the new dosage.

## 2017-07-01 NOTE — Progress Notes (Signed)
Marikay AlarEric Julious Langlois, MD Phone: [phone]  Oliver HumDeborah H Acosta is a 61 y.o. female who presents today for follow-up.  Hypertension: Does not check.  She stopped the carvedilol given it caused itching.  She is taking HCTZ and lisinopril.  No chest pain, shortness of breath, or edema.  Notes her depression is slightly worse.  She is fairly anxious.  She has had a lot of stress in her life.  She discontinued Wellbutrin because she could not afford it.  She is taking Zoloft.  No SI or HI.  Not much of an appetite.  Is not checking her blood sugars.  She is taking metformin.  She skips the glipizide if she does not eat.  Rarely gets hypoglycemic.  She tried coming off her gabapentin over a month ago.  She stopped it cold [place].  She reports stopping it she had 2 episodes where she would start to tremble and feel like she was going to fall.  She would sit down and then have trouble remembering what happened.  She notes a small amount of urine incontinence with this.  She notes postictal phase as well.  Notes one witnessed episode while in a store.  She did not seek evaluation following this.  She noted no chest pain or palpitations or cardiac symptoms.  She notes no prior history of seizures though does note her father had seizures.  She reports she remembers parts of the episodes though other parts she does not remember.  She started back on her gabapentin and has not had any issues since then.  Has not had any episodes in over a month.  Social History   Tobacco Use  Smoking Status Current Some Day Smoker  . Packs/day: 0.25  . Years: 40.00  . Pack years: 10.00  . Types: Cigarettes  Smokeless Tobacco Never Used  Tobacco Comment   smokes when stressed "Not many"     ROS see history of present illness  Objective  Physical Exam Vitals:   07/01/17 1109  BP: (!) 160/100  Pulse: 76  Temp: 98.3 F (36.8 C)  SpO2: 98%    BP Readings from Last 3 Encounters:  07/01/17 (!) 160/100  03/10/17  (!) 160/88  02/15/17 (!) 160/88   Wt Readings from Last 3 Encounters:  07/01/17 165 lb (74.8 kg)  02/15/17 160 lb 6.4 oz (72.8 kg)  11/13/16 162 lb 3.2 oz (73.6 kg)    Physical Exam  Constitutional: No distress.  Cardiovascular: Normal rate, regular rhythm and normal heart sounds.  Pulmonary/Chest: Effort normal and breath sounds normal.  Neurological: She is alert. Gait normal.  Slight decreased V1 sensation on the left side, otherwise CN 2-12 intact, 5/5 strength in bilateral biceps, triceps, grip, quads, hamstrings, plantar and dorsiflexion, sensation to light touch intact in bilateral UE and LE  Skin: Skin is warm and dry. She is not diaphoretic.     Assessment/Plan: Please see individual problem list.  Essential (primary) hypertension Uncontrolled.  Patient on max dose hydrochlorothiazide and lisinopril.  She is unable to afford any additional medications at this time.  She will contact us when she can afford medication.  Type 2 diabetes mellitus (HCC) Continue current regimen.  She will avoid glipizide if she does not eat.  Diabetic peripheral neuropathy (HCC) Currently on gabapentin.  She tried to come off of this previously and had 2 episodes that potentially could have been seizures versus withdrawal symptoms.  I discussed this at length with the patient.  Discussed given the concern  for seizures that she should not drive and advised of the [place] policy preventing driving for 6 months after a seizure episode.  We will submit DMV paperwork advising them of this.  She will discuss with her son as she does not have anybody to transport her where she currently lives.  She will not discontinue the gabapentin.  I advised neurology evaluation though she declined given that she cannot afford this at this time.  Anxiety and depression Slightly worse since coming off the Wellbutrin.  No SI or HI.  Discussed continuing Zoloft.  Could consider increasing the dose though she is  not able to afford any medication right now.  She will let us know how many tablets she has on hand at home to determine if the dosage can be changed prior to her getting paid so she can afford the new dosage.   Gavin PoundDeborah was seen today for follow-up.  Diagnoses and all orders for this visit:  Need for immunization against influenza -     Flu Vaccine QUAD 36+ mos IM  Essential (primary) hypertension  Type 2 diabetes mellitus with diabetic polyneuropathy, without long-term current use of insulin (HCC)  Diabetic peripheral neuropathy (HCC)  Anxiety and depression    Orders Placed This Encounter  Procedures  . Flu Vaccine QUAD 36+ mos IM    No orders of the defined types were placed in this encounter.    Marikay AlarEric Natallie Ravenscroft, MD [facility]

## 2017-07-01 NOTE — Assessment & Plan Note (Signed)
Continue current regimen.  She will avoid glipizide if she does not eat.

## 2017-07-12 ENCOUNTER — Telehealth: Payer: Self-pay | Admitting: Family Medicine

## 2017-07-12 MED ORDER — METFORMIN HCL 1000 MG PO TABS: 1000.0000 mg | ORAL_TABLET | Freq: Two times a day (BID) | ORAL | 2 refills | Status: DC

## 2017-07-12 NOTE — Telephone Encounter (Signed)
Copied from CRM [phone]. Topic: Quick Communication - See Telephone Encounter >> Jul 12, 2017 11:28 AM Windy KalataMichael, Cloris Flippo L, NT wrote: CRM for notification. See Telephone encounter for:  07/12/17.  Pt states [facility].. She needs her metformin refill sent to that pharmacy. Pt has already contacted pharmacy and said office needed to send over request.

## 2017-07-12 NOTE — Telephone Encounter (Signed)
Called pt to notify of prescription being refilled, but no answer and no voicemail set up.

## 2017-07-19 ENCOUNTER — Telehealth: Payer: Self-pay | Admitting: *Deleted

## 2017-07-19 NOTE — Telephone Encounter (Signed)
Copied from CRM [id number]. Topic: Inquiry >> Jul 19, 2017 11:01 AM Alexander BergeronBarksdale, Harvey B wrote: Reason for CRM: pt called to and is needing to have communicate with the nurse of Dr. Birdie SonsSonnenberg or the doctor himself. She states that she is experiencing seizures and is wanting some counsel/consultation about the matter  Patient needs letter and paperwork signed has an appointment per [facility].

## 2017-07-20 ENCOUNTER — Encounter: Payer: Self-pay | Admitting: Family Medicine

## 2017-07-20 ENCOUNTER — Ambulatory Visit (INDEPENDENT_AMBULATORY_CARE_PROVIDER_SITE_OTHER): Payer: Medicare Other | Admitting: Family Medicine

## 2017-07-20 ENCOUNTER — Other Ambulatory Visit: Payer: Self-pay

## 2017-07-20 VITALS — BP 138/86 | HR 85 | Temp 99.0°F | Wt 165.6 lb

## 2017-07-20 DIAGNOSIS — J069 Acute upper respiratory infection, unspecified: Secondary | ICD-10-CM | POA: Diagnosis not present

## 2017-07-20 DIAGNOSIS — R569 Unspecified convulsions: Secondary | ICD-10-CM | POA: Diagnosis not present

## 2017-07-20 DIAGNOSIS — E1142 Type 2 diabetes mellitus with diabetic polyneuropathy: Secondary | ICD-10-CM

## 2017-07-20 LAB — COMPREHENSIVE METABOLIC PANEL
ALT: 9 U/L (ref 0–35)
AST: 11 U/L (ref 0–37)
Albumin: 4.3 g/dL (ref 3.5–5.2)
Alkaline Phosphatase: 74 U/L (ref 39–117)
BILIRUBIN TOTAL: 0.3 mg/dL (ref 0.2–1.2)
BUN: 23 mg/dL (ref 6–23)
CALCIUM: 9.9 mg/dL (ref 8.4–10.5)
CHLORIDE: 101 meq/L (ref 96–112)
CO2: 26 meq/L (ref 19–32)
Creatinine, Ser: 1.22 mg/dL — ABNORMAL HIGH (ref 0.40–1.20)
GFR: 47.41 mL/min — AB (ref >60.00)
GLUCOSE: 308 mg/dL — AB (ref 70–99)
Potassium: 4.8 mEq/L (ref 3.5–5.1)
Sodium: 135 mEq/L (ref 135–145)
Total Protein: 7.8 g/dL (ref 6.0–8.3)

## 2017-07-20 LAB — LDL CHOLESTEROL, DIRECT: LDL DIRECT: 107 mg/dL

## 2017-07-20 LAB — LIPID PANEL
Cholesterol: 288 mg/dL — ABNORMAL HIGH (ref 0–200)
HDL: 35.3 mg/dL — AB (ref >39.00)
Total CHOL/HDL Ratio: 8

## 2017-07-20 LAB — HEMOGLOBIN A1C: HEMOGLOBIN A1C: 9.2 % — AB (ref 4.6–6.5)

## 2017-07-20 NOTE — Progress Notes (Signed)
Tommi Rumps, MD Phone: [phone]  Tammy Acosta is a 61 y.o. female who presents today for follow-up.  Patient notes she has had no further seizure activity since we last saw her.  She has been taking the gabapentin as prescribed.  She has been working on moving closer to her son and has a plan in place that needs a letter for her current apartment complex.  She has had odd smells at times that last about a minute though no loss of consciousness or seizure-like activity otherwise.  She is afraid to go down and up the stairs at her apartment complex.  She has not been driving.  Patient notes sinus issues over the last week.  Does have a history of allergies though notes some sinus congestion.  Clear mucus that is thick out of her nose.  Some postnasal drip.  Some cough.  No fevers.  Has previously responded well to Flonase.  Diabetes: due for lab work.  She is taking her medications.  Social History   Tobacco Use  Smoking Status Current Some Day Smoker  . Packs/day: 0.25  . Years: 40.00  . Pack years: 10.00  . Types: Cigarettes  Smokeless Tobacco Never Used  Tobacco Comment   smokes when stressed "Not many"     ROS see history of present illness  Objective  Physical Exam Vitals:   07/20/17 1009 07/20/17 1056  BP: (!) 142/90 138/86  Pulse: 85   Temp: 99 F (37.2 C)   SpO2: 95%     BP Readings from Last 3 Encounters:  07/20/17 138/86  07/01/17 (!) 160/100  03/10/17 (!) 160/88   Wt Readings from Last 3 Encounters:  07/20/17 165 lb 9.6 oz (75.1 kg)  07/01/17 165 lb (74.8 kg)  02/15/17 160 lb 6.4 oz (72.8 kg)    Physical Exam  Constitutional: No distress.  HENT:  Head: Normocephalic and atraumatic.  Mouth/Throat: Oropharynx is clear and moist. No oropharyngeal exudate.  Normal TMs, frontal sinuses tender to percussion  Eyes: Conjunctivae are normal. Pupils are equal, round, and reactive to light.  Neck: Neck supple.  Cardiovascular: Normal rate,  regular rhythm and normal heart sounds.  Pulmonary/Chest: Effort normal and breath sounds normal.  Lymphadenopathy:    She has no cervical adenopathy.  Neurological: She is alert. Gait normal.  Skin: Skin is warm and dry. She is not diaphoretic.     Assessment/Plan: Please see individual problem list.  Seizure [facility]) Patient with prior seizure-like activity in the setting of coming off of gabapentin without taper.  Does have family history of seizures.  She is interested in seeing neurology at this point though would like to wait until after Christmas given financial issues.  She is not interested in starting on medication for this at this time.  The would like to wait until she sees neurology at this point.  She has DMV forms to be filled out.  She needs a letter to get out of release so she can move closer to her son who can transport her.  Advised not to drive until seizure-free for 6 months.  Neurology referral placed.  Upper respiratory infection Viral versus bacterial at this point.  Patient has allergies to penicillins and doxycycline.  Discussed risk of Levaquin and patient wanted to hold off on this.  Discussed possibility of trying clindamycin though patient wanted to trial supportive care first.  She will use Flonase over-the-counter as she has tolerate this previously.  If not improving can trial clindamycin.  Type 2 diabetes mellitus ([place]) Labs ordered.    [name] was seen today for follow-up.  Diagnoses and all orders for this visit:  Seizure [facility]) -     Ambulatory referral to Neurology  Upper respiratory tract infection, unspecified type  Type 2 diabetes mellitus with diabetic polyneuropathy, without long-term current use of insulin (HCC) -     HgB A1c -     Comp Met (CMET) -     Lipid Profile    Orders Placed This Encounter  Procedures  . HgB A1c  . Comp Met (CMET)  . Lipid Profile  . Ambulatory referral to Neurology    Referral Priority:   Routine     Referral Type:   Consultation    Referral Reason:   Specialty Services Required    Requested Specialty:   Neurology    Number of Visits Requested:   1    No orders of the defined types were placed in this encounter.    Tommi Rumps, MD [place]

## 2017-07-20 NOTE — Patient Instructions (Signed)
Nice to see you. We will get you into see neurology. Please try Flonase for your sinuses.  If not improving we could try clindamycin. Please do not drive until you have been seizure-free for 6 months.

## 2017-07-20 NOTE — Assessment & Plan Note (Signed)
Patient with prior seizure-like activity in the setting of coming off of gabapentin without taper.  Does have family history of seizures.  She is interested in seeing neurology at this point though would like to wait until after Christmas given financial issues.  She is not interested in starting on medication for this at this time.  The would like to wait until she sees neurology at this point.  She has DMV forms to be filled out.  She needs a letter to get out of release so she can move closer to her son who can transport her.  Advised not to drive until seizure-free for 6 months.  Neurology referral placed.

## 2017-07-20 NOTE — Assessment & Plan Note (Signed)
Viral versus bacterial at this point.  Patient has allergies to penicillins and doxycycline.  Discussed risk of Levaquin and patient wanted to hold off on this.  Discussed possibility of trying clindamycin though patient wanted to trial supportive care first.  She will use Flonase over-the-counter as she has tolerate this previously.  If not improving can trial clindamycin.

## 2017-07-20 NOTE — Assessment & Plan Note (Signed)
Labs ordered.

## 2017-07-28 ENCOUNTER — Telehealth: Payer: Self-pay | Admitting: Family Medicine

## 2017-07-28 NOTE — Telephone Encounter (Signed)
Please let the patient know that there are stronger cholesterol medications and there is a medication specifically for elevated triglycerides that are on the $4 list at [facility].  If the patient is willing I can send these to her pharmacy to start on.  If she wants to wait until it is financially feasible that is fine as well.  Thanks.

## 2017-07-28 NOTE — Telephone Encounter (Signed)
-----   Message from Allena KatzCaroline E Welles, [facility] sent at 07/27/2017  8:14 PM EST ----- FYI atorvastatin 10, 20, and 40 mg tabs are on the $4 list, so is fenofibrate 145 mg thankfully!  ----- Message ----- From: Glori LuisSonnenberg, Willoughby Doell G, MD Sent: 07/27/2017   4:52 PM To: Allena Katzaroline E Welles, RPH, #  Noted.  When patient is financially able to afford other medications she can let us know.  I will check with [place] Citizen[place]oline as well to see if there is any medication assistance we can provide the patient.  Thanks.

## 2017-07-29 NOTE — Telephone Encounter (Signed)
Patient states [facility], she states she will call us after she receives her check and will let us know

## 2017-09-03 ENCOUNTER — Telehealth: Payer: Self-pay | Admitting: Family Medicine

## 2017-09-03 NOTE — Telephone Encounter (Signed)
Please advise 

## 2017-09-03 NOTE — Telephone Encounter (Signed)
Copied from CRM [phone]. Topic: Quick Communication - See Telephone Encounter >> Sep 03, 2017  4:56 PM Cipriano BunkerLambe, Annette S wrote: CRM for notification. See Telephone encounter for:   DMV did not receive fax and revoked her license.  She is needing help with this . Please call pt.   09/03/17.

## 2017-09-06 NOTE — Telephone Encounter (Signed)
Informed patient that her form were faxed but they have been sent to scanning so I do not have a copy of this until the scanning department scans it into her chart. Informed patient she can drop off a copy and I will call.

## 2017-09-08 ENCOUNTER — Telehealth: Payer: Self-pay | Admitting: Family Medicine

## 2017-09-08 MED ORDER — SERTRALINE HCL 100 MG PO TABS: 100.0000 mg | ORAL_TABLET | Freq: Every day | ORAL | 1 refills | Status: DC

## 2017-09-08 NOTE — Telephone Encounter (Signed)
[facility]

## 2017-09-08 NOTE — Telephone Encounter (Signed)
Pt dropped off forms to be faxed  Placed in Dr. Kermit BaloSonnenbergs color folder up front  Please fax asap

## 2017-09-08 NOTE — Telephone Encounter (Signed)
Mailed a copy to patients address in chart, [facility], fax is responding as busy and unable to deliver per fax.

## 2017-09-08 NOTE — Telephone Encounter (Signed)
Copied from CRM [phone]. Topic: Quick Communication - Rx Refill/Question >> Sep 08, 2017 10:33 AM Oneal GroutSebastian, Jennifer S wrote: Medication:  sertraline (ZOLOFT) 100 MG tablet    Has the patient contacted their pharmacy? No, new pharmacy   (Agent: If no, request that the patient contact the pharmacy for the refill.)   Preferred Pharm[facility] (with phone number or street name[facility] [address] [place]   Agent: Please be advised that RX refills may take up to 3 business days. We ask that you follow-up with your pharmacy.

## 2017-10-13 ENCOUNTER — Other Ambulatory Visit: Payer: Self-pay | Admitting: Family Medicine

## 2017-10-15 ENCOUNTER — Other Ambulatory Visit: Payer: Self-pay

## 2017-10-15 ENCOUNTER — Ambulatory Visit (INDEPENDENT_AMBULATORY_CARE_PROVIDER_SITE_OTHER): Payer: Medicare Other | Admitting: Family Medicine

## 2017-10-15 ENCOUNTER — Encounter: Payer: Self-pay | Admitting: Family Medicine

## 2017-10-15 VITALS — BP 142/80 | HR 75 | Temp 98.6°F | Wt 164.4 lb

## 2017-10-15 DIAGNOSIS — Z1239 Encounter for other screening for malignant neoplasm of breast: Secondary | ICD-10-CM

## 2017-10-15 DIAGNOSIS — R2230 Localized swelling, mass and lump, unspecified upper limb: Secondary | ICD-10-CM | POA: Insufficient documentation

## 2017-10-15 DIAGNOSIS — I10 Essential (primary) hypertension: Secondary | ICD-10-CM | POA: Diagnosis not present

## 2017-10-15 DIAGNOSIS — Z1231 Encounter for screening mammogram for malignant neoplasm of breast: Secondary | ICD-10-CM | POA: Diagnosis not present

## 2017-10-15 DIAGNOSIS — R569 Unspecified convulsions: Secondary | ICD-10-CM | POA: Diagnosis not present

## 2017-10-15 DIAGNOSIS — R2232 Localized swelling, mass and lump, left upper limb: Secondary | ICD-10-CM

## 2017-10-15 DIAGNOSIS — E1142 Type 2 diabetes mellitus with diabetic polyneuropathy: Secondary | ICD-10-CM | POA: Diagnosis not present

## 2017-10-15 NOTE — Assessment & Plan Note (Signed)
Relatively stable on gabapentin.  She will continue this.

## 2017-10-15 NOTE — Assessment & Plan Note (Signed)
Remains uncontrolled.  Patient is willing to trial additional medication.  I discussed sending in amlodipine.  I attempted to send this and after she left and it was noted there is a warning regarding amlodipine and simvastatin.  I will have the CMA contact the patient to determine if she is okay switching her cholesterol medication to one that is more compatible with amlodipine.

## 2017-10-15 NOTE — Patient Instructions (Signed)
Nice to see you. You should not be driving. We will get you referred to an orthopedist. You will need to return for an A1c.  You should have somebody transport you to this appointment. Please schedule an appointment with neurology.

## 2017-10-15 NOTE — Assessment & Plan Note (Addendum)
Seems to have improved control.  She will return in a week or 2 for lab work with an A1c.  Foot exam completed.  Refer to ophthalmology for eye exam.

## 2017-10-15 NOTE — Progress Notes (Signed)
Tammy Alar, MD Phone: [phone]  Tammy Acosta is a 62 y.o. female who presents today for f/u.  HYPERTENSION  Disease Monitoring  Home BP Monitoring not checking Chest pain- no    Dyspnea- no Medications  Compliance-  Taking hctz, lisinopril.   Edema- no  DIABETES Disease Monitoring: Blood Sugar ranges-114 on last check Polyuria/phagia/dipsia- no       Medications: Compliance- taking glipizide, metformin Hypoglycemic symptoms- no  Seizure: Patient with apparent seizures late last year.  Occurred in the setting of discontinuing her gabapentin suddenly.  No prior history of seizures.  She did not keep her appointment with neurology due to an acute illness that she had.  She has not had any recurrence.  She does still occasionally drive despite being previously advised to not drive due to safety concerns and forms being sent to the [facility]  Patient notes a nodule on her left radial aspect index finger PIP joint this been present for about 3 months.  Slightly enlarged.  Some pain if she bumps into it.  Notes her thumb hurts as well.   Social History   Tobacco Use  Smoking Status Current Some Day Smoker  . Packs/day: 0.25  . Years: 40.00  . Pack years: 10.00  . Types: Cigarettes  Smokeless Tobacco Never Used  Tobacco Comment   smokes when stressed "Not many"     ROS see history of present illness  Objective  Physical Exam Vitals:   10/15/17 1419  BP: (!) 142/80  Pulse: 75  Temp: 98.6 F (37 C)  SpO2: 96%    BP Readings from Last 3 Encounters:  10/15/17 (!) 142/80  07/20/17 138/86  07/01/17 (!) 160/100   Wt Readings from Last 3 Encounters:  10/15/17 164 lb 6.4 oz (74.6 kg)  07/20/17 165 lb 9.6 oz (75.1 kg)  07/01/17 165 lb (74.8 kg)    Physical Exam  Constitutional: No distress.  Cardiovascular: Normal rate, regular rhythm and normal heart sounds.  Pulmonary/Chest: Effort normal and breath sounds normal.  Musculoskeletal: She exhibits no edema.    Minimally tender nonerythematous nodule over the radial aspect of the left index finger PIP joint, possibly attached to the joint line though this is difficult to tell, there is no warmth or induration  Neurological: She is alert. Gait normal.  Skin: Skin is warm and dry. She is not diaphoretic.   Diabetic Foot Exam - Simple   Simple Foot Form Diabetic Foot exam was performed with the following findings:  Yes 10/15/2017  2:45 PM  Visual Inspection See comments:  Yes Sensation Testing See comments:  Yes Pulse Check Posterior Tibialis and Dorsalis pulse intact bilaterally:  Yes Comments Thickened great toenails bilaterally, no other deformities noted, decreased monofilament testing on plantar surfaces of her foot bilaterally, intact light touch      Assessment/Plan: Please see individual problem list.  Essential (primary) hypertension Remains uncontrolled.  Patient is willing to trial additional medication.  I discussed sending in amlodipine.  I attempted to send this and after she left and it was noted there is a warning regarding amlodipine and simvastatin.  I will have the CMA contact the patient to determine if she is okay switching her cholesterol medication to one that is more compatible with amlodipine.  Type 2 diabetes mellitus (HCC) Seems to have improved control.  She will return in a week or 2 for lab work with an A1c.  Foot exam completed.  Refer to ophthalmology for eye exam.  Diabetic peripheral  neuropathy (HCC) Relatively stable on gabapentin.  She will continue this.  Seizure [facility](HCC) Patient with seizure activity in the setting of coming off gabapentin previously.  She has not had any issues since going back on this.  She has continued to drive intermittently.  I advised her not to drive again and discussed [facility] if she were to be caught driving there could be potential consequences related to this.  She will contact neurology to  reschedule her appointment.  She will continue on her gabapentin.  Nodule of finger Unknown cause.  Will refer to orthopedics.   Orders Placed This Encounter  Procedures  . Ambulatory referral to Ophthalmology    Referral Priority:   Routine    Referral Type:   Consultation    Referral Reason:   Specialty Services Required    Requested Specialty:   Ophthalmology    Number of Visits Requested:   1  . Ambulatory referral to Orthopedic Surgery    Referral Priority:   Routine    Referral Type:   Surgical    Referral Reason:   Specialty Services Required    Requested Specialty:   Orthopedic Surgery    Number of Visits Requested:   1  . POCT HgB A1C    Standing Status:   Future    Standing Expiration Date:   12/13/2017    No orders of the defined types were placed in this encounter.  Patients last mammogram was apparently in 2008 and felt to be benign though she was supposed to have recall 6 months later for follow-up.  This is not done and she has not followed up on this.  We will order mammograms and ultrasounds as indicated in the imaging tab.  She also notes that she has not had a Pap smear or colonoscopy anytime recently.  I discussed having her come back for a physical exam at her next visit.  Tammy AlarEric Aden Youngman, MD [facility]

## 2017-10-15 NOTE — Assessment & Plan Note (Signed)
Unknown cause.  Will refer to orthopedics.

## 2017-10-15 NOTE — Assessment & Plan Note (Signed)
Patient with seizure activity in the setting of coming off gabapentin previously.  She has not had any issues since going back on this.  She has continued to drive intermittently.  I advised her not to drive again and discussed given that [facility] if she were to be caught driving there could be potential consequences related to this.  She will contact neurology to reschedule her appointment.  She will continue on her gabapentin.

## 2017-10-18 ENCOUNTER — Telehealth: Payer: Self-pay

## 2017-10-18 DIAGNOSIS — E785 Hyperlipidemia, unspecified: Secondary | ICD-10-CM

## 2017-10-18 NOTE — Telephone Encounter (Signed)
Patient notified and states she does not want a colonoscopy. She states she is ok with switching to lipitor and has been scheduled for 1 month lab recheck. Please place orders. Patient is aware you are out of the office. She is ok with starting amlodipine and lipitor

## 2017-10-18 NOTE — Telephone Encounter (Signed)
Copied from CRM [id number]. Topic: General - Other >> Oct 18, 2017  9:30 AM Darletta MollLander, Lumin L wrote: Reason for CRM: [facility] visit that he'd prescribe a new script to take in addition to what she's already taking for high bp but was not prescribed anything. She would like to confirm that he didn't forget.

## 2017-10-18 NOTE — Telephone Encounter (Signed)
Patient notified please see other message

## 2017-10-18 NOTE — Telephone Encounter (Signed)
-----   Message from Glori LuisEric G Sonnenberg, MD sent at 10/15/2017  7:17 PM EST ----- Can you call the patient and see if she is interested in having a colonoscopy done?  I briefly discussed this with her and having her come back for a physical though I wanted to see if she wanted to be referred for colonoscopy prior to her physical.  Can you please also let her know that the blood pressure medication that I was going to send in can potentially increase exposure to simvastatin which could result in muscle aches, possible muscle breakdown, or liver issues when used in combination.  I wanted to see if she would be willing to change the simvastatin to Lipitor which does not have this warning.  If so I can send in amlodipine and Lipitor.  Thanks.

## 2017-10-19 MED ORDER — ATORVASTATIN CALCIUM 40 MG PO TABS: 40.0000 mg | ORAL_TABLET | Freq: Every day | ORAL | 3 refills | Status: DC

## 2017-10-19 MED ORDER — AMLODIPINE BESYLATE 5 MG PO TABS: 5.0000 mg | ORAL_TABLET | Freq: Every day | ORAL | 3 refills | Status: DC

## 2017-10-19 NOTE — Addendum Note (Signed)
Addended by: Glori LuisSONNENBERG, ERIC G on: 10/19/2017 03:01 PM   Modules accepted: Orders

## 2017-10-19 NOTE — Telephone Encounter (Addendum)
Sent to pharmacy. Orders placed. She should discontinue the simvastatin once she picks up the new medications. Thanks.

## 2017-10-20 NOTE — Telephone Encounter (Signed)
Patient notified

## 2017-10-21 ENCOUNTER — Ambulatory Visit: Payer: Medicare Other

## 2017-11-02 ENCOUNTER — Ambulatory Visit (INDEPENDENT_AMBULATORY_CARE_PROVIDER_SITE_OTHER): Payer: Medicare Other

## 2017-11-02 DIAGNOSIS — E1142 Type 2 diabetes mellitus with diabetic polyneuropathy: Secondary | ICD-10-CM

## 2017-11-02 LAB — POCT GLYCOSYLATED HEMOGLOBIN (HGB A1C): HEMOGLOBIN A1C: 13.2

## 2017-11-02 NOTE — Progress Notes (Signed)
Patient's A1c is significantly worse than prior.  It is at a level where I would recommend that she needs insulin though it appears she is unwilling to do this.  Please give her my recommendation that we start her on insulin.  Please see how much cost will be an issue for her as it has been a limiting step in her medication management previously.  I will send this message to Ray[place] CitizenCaroline as well to get her input to figure out the best oral regimen to place this patient on.  I believe it would be difficult for her to afford a medicine such as [place] PepperJardiance, Tradjenta, or Trulicity.  I wonder if we would be able to get her any medication assistance.

## 2017-11-02 NOTE — Progress Notes (Signed)
Patient comes in today for an  POCT A1C. Informed patient we will call her with results. Patient states she does not want to do insulin. She states if its still elevated she is ok with changing oral medication but not insulin.

## 2017-11-03 ENCOUNTER — Telehealth: Payer: Self-pay

## 2017-11-03 NOTE — Telephone Encounter (Signed)
-----   Message from Glori LuisEric G Sonnenberg, MD sent at 11/02/2017  5:13 PM EDT ----- Rayfield Citizenaroline,   Please see my note. Can you look at her chart and give me some recommendations for low cost oral medications for diabetes? She has had cost issues before and I think she may not be able to afford some of my preferred medications. Thanks.   Minerva AreolaEric

## 2017-11-03 NOTE — Telephone Encounter (Signed)
Patient notified and is scheduled with Rayfield Citizenaroline on march 25th at 4pm. Patient would like to know if she starts insulin will she be able to discontinue any of the other medications? She states she doesn't know if she is able to give herself injections.

## 2017-11-03 NOTE — Telephone Encounter (Signed)
Patient notified and would like to meet with caroline first

## 2017-11-03 NOTE — Telephone Encounter (Signed)
Noted  

## 2017-11-03 NOTE — Telephone Encounter (Signed)
The insulin would be in addition to some of her other medications.  We would likely stop the glipizide if we started on insulin.  We could have her come in for insulin teaching if she decides to go that direction.  Thanks.

## 2017-11-08 ENCOUNTER — Other Ambulatory Visit: Payer: Medicare Other

## 2017-11-10 ENCOUNTER — Other Ambulatory Visit: Payer: Self-pay | Admitting: Family Medicine

## 2017-11-10 DIAGNOSIS — M7918 Myalgia, other site: Secondary | ICD-10-CM

## 2017-11-15 ENCOUNTER — Ambulatory Visit: Payer: Medicare Other | Admitting: Pharmacist

## 2017-11-17 ENCOUNTER — Other Ambulatory Visit: Payer: Medicare Other

## 2017-11-22 ENCOUNTER — Ambulatory Visit: Payer: Medicare Other | Admitting: Pharmacist

## 2017-11-24 ENCOUNTER — Other Ambulatory Visit: Payer: Medicare Other

## 2017-12-08 ENCOUNTER — Other Ambulatory Visit: Payer: Self-pay | Admitting: Family Medicine

## 2018-01-03 ENCOUNTER — Telehealth: Payer: Self-pay | Admitting: Pharmacist

## 2018-01-03 MED ORDER — GLUCOSE BLOOD VI STRP: ORAL_STRIP | 12 refills | Status: DC

## 2018-01-03 MED ORDER — ONETOUCH ULTRASOFT LANCETS MISC: 12 refills | Status: DC

## 2018-01-03 MED ORDER — ONETOUCH ULTRA 2 W/DEVICE KIT: PACK | 0 refills | Status: DC

## 2018-01-03 NOTE — Telephone Encounter (Signed)
Pt returned my call. Scheduled for 5/20 at [facility]. Asked patient to bring in financial documentation and blood glucose data. Patient requests that refills are sent for her test strips and lancets (onetouch). Will send in refills as patient was referred to me by Dr. Birdie Sons on 11/03/2017.   Allena Katz, Pharm.D., BCPS PGY2 Ambulatory Care Pharmacy Resident Phone: [phone]

## 2018-01-03 NOTE — Telephone Encounter (Signed)
Called patient to follow up on DM and reschedule her missed appointment. No answer, left HIPAA-compliant VM requesting she return my call.  Allena Katz, Pharm.D., BCPS PGY2 Ambulatory Care Pharmacy Resident Phone: [phone]

## 2018-01-03 NOTE — Addendum Note (Signed)
Addended by: Devota Pace E on: 01/03/2018 11:23 AM   Modules accepted: Orders

## 2018-01-06 ENCOUNTER — Ambulatory Visit: Payer: Self-pay

## 2018-01-06 ENCOUNTER — Emergency Department
Admission: EM | Admit: 2018-01-06 | Discharge: 2018-01-06 | Disposition: A | Discharge status: Home / Self Care | Payer: Medicare Other | Attending: Emergency Medicine | Admitting: Emergency Medicine

## 2018-01-06 ENCOUNTER — Other Ambulatory Visit: Payer: Self-pay

## 2018-01-06 ENCOUNTER — Encounter: Payer: Self-pay | Admitting: Emergency Medicine

## 2018-01-06 DIAGNOSIS — R739 Hyperglycemia, unspecified: Secondary | ICD-10-CM

## 2018-01-06 DIAGNOSIS — R42 Dizziness and giddiness: Secondary | ICD-10-CM | POA: Diagnosis not present

## 2018-01-06 DIAGNOSIS — Z79899 Other long term (current) drug therapy: Secondary | ICD-10-CM | POA: Insufficient documentation

## 2018-01-06 DIAGNOSIS — R112 Nausea with vomiting, unspecified: Secondary | ICD-10-CM | POA: Diagnosis not present

## 2018-01-06 DIAGNOSIS — K859 Acute pancreatitis without necrosis or infection, unspecified: Secondary | ICD-10-CM

## 2018-01-06 DIAGNOSIS — F17210 Nicotine dependence, cigarettes, uncomplicated: Secondary | ICD-10-CM | POA: Diagnosis not present

## 2018-01-06 DIAGNOSIS — E1165 Type 2 diabetes mellitus with hyperglycemia: Secondary | ICD-10-CM | POA: Insufficient documentation

## 2018-01-06 DIAGNOSIS — Z7982 Long term (current) use of aspirin: Secondary | ICD-10-CM | POA: Diagnosis not present

## 2018-01-06 DIAGNOSIS — Z7984 Long term (current) use of oral hypoglycemic drugs: Secondary | ICD-10-CM | POA: Diagnosis not present

## 2018-01-06 DIAGNOSIS — I10 Essential (primary) hypertension: Secondary | ICD-10-CM | POA: Insufficient documentation

## 2018-01-06 LAB — URINALYSIS, COMPLETE (UACMP) WITH MICROSCOPIC
BILIRUBIN URINE: NEGATIVE
Glucose, UA: >=500 mg/dL — AB
Hgb urine dipstick: NEGATIVE
Ketones, ur: NEGATIVE mg/dL
Leukocytes, UA: NEGATIVE
Nitrite: NEGATIVE
PROTEIN: NEGATIVE mg/dL
SPECIFIC GRAVITY, URINE: 1.022 (ref 1.005–1.030)
pH: 5 (ref 5.0–8.0)

## 2018-01-06 LAB — CBC
HCT: 40.4 % (ref 35.0–47.0)
HEMOGLOBIN: 14 g/dL (ref 12.0–16.0)
MCH: 31.1 pg (ref 26.0–34.0)
MCHC: 34.6 g/dL (ref 32.0–36.0)
MCV: 89.8 fL (ref 80.0–100.0)
Platelets: 236 10*3/uL (ref 150–440)
RBC: 4.49 MIL/uL (ref 3.80–5.20)
RDW: 14.6 % — AB (ref 11.5–14.5)
WBC: 8.3 10*3/uL (ref 3.6–11.0)

## 2018-01-06 LAB — HEPATIC FUNCTION PANEL
ALK PHOS: 102 U/L (ref 38–126)
ALT: 15 U/L (ref 14–54)
AST: 19 U/L (ref 15–41)
Albumin: 4 g/dL (ref 3.5–5.0)
BILIRUBIN TOTAL: 0.7 mg/dL (ref 0.3–1.2)
Bilirubin, Direct: <0.1 mg/dL — ABNORMAL LOW (ref 0.1–0.5)
Total Protein: 8.1 g/dL (ref 6.5–8.1)

## 2018-01-06 LAB — BASIC METABOLIC PANEL
ANION GAP: 15 (ref 5–15)
BUN: 40 mg/dL — AB (ref 6–20)
CALCIUM: 9.3 mg/dL (ref 8.9–10.3)
CO2: 22 mmol/L (ref 22–32)
Chloride: 86 mmol/L — ABNORMAL LOW (ref 101–111)
Creatinine, Ser: 1.5 mg/dL — ABNORMAL HIGH (ref 0.44–1.00)
GFR calc Af Amer: 42 mL/min — ABNORMAL LOW (ref >60)
GFR, EST NON AFRICAN AMERICAN: 36 mL/min — AB (ref >60)
GLUCOSE: 605 mg/dL — AB (ref 65–99)
Potassium: 4.4 mmol/L (ref 3.5–5.1)
SODIUM: 123 mmol/L — AB (ref 135–145)

## 2018-01-06 LAB — TROPONIN I: Troponin I: <0.03 ng/mL (ref <0.03)

## 2018-01-06 LAB — GLUCOSE, CAPILLARY
Glucose-Capillary: 219 mg/dL — ABNORMAL HIGH (ref 65–99)
Glucose-Capillary: 390 mg/dL — ABNORMAL HIGH (ref 65–99)

## 2018-01-06 LAB — LIPASE, BLOOD: LIPASE: 76 U/L — AB (ref 11–51)

## 2018-01-06 MED ORDER — ONDANSETRON 4 MG PO TBDP: 4.0000 mg | ORAL_TABLET | Freq: Three times a day (TID) | ORAL | 0 refills | Status: DC | PRN

## 2018-01-06 MED ORDER — SODIUM CHLORIDE 0.9 % IV BOLUS
1000.0000 mL | Freq: Once | INTRAVENOUS | Status: AC
Start: 2018-01-06 — End: 2018-01-06
  Administered 2018-01-06: 1000 mL via INTRAVENOUS

## 2018-01-06 MED ORDER — SODIUM CHLORIDE 0.9 % IV BOLUS
1000.0000 mL | Freq: Once | INTRAVENOUS | Status: AC
  Administered 2018-01-06: 1000 mL via INTRAVENOUS

## 2018-01-06 MED ORDER — INSULIN ASPART 100 UNIT/ML ~~LOC~~ SOLN
7.0000 [IU] | Freq: Once | SUBCUTANEOUS | Status: AC
Start: 2018-01-06 — End: 2018-01-06
  Administered 2018-01-06: 7 [IU] via INTRAVENOUS
  Filled 2018-01-06: qty 1

## 2018-01-06 MED ORDER — INSULIN ASPART 100 UNIT/ML ~~LOC~~ SOLN
10.0000 [IU] | Freq: Once | SUBCUTANEOUS | Status: AC
  Administered 2018-01-06: 10 [IU] via INTRAVENOUS
  Filled 2018-01-06: qty 1

## 2018-01-06 NOTE — Telephone Encounter (Signed)
FYI Patient has been triaged and informed to go to ED and she states she is waiting on her son.

## 2018-01-06 NOTE — ED Provider Notes (Signed)
[facility] Emergency Department Provider Note  ____________________________________________  Time seen: Approximately 7:24 PM  I have reviewed the triage vital signs and the nursing notes.   HISTORY  Chief Complaint Hyperglycemia   HPI Tammy Acosta is a 62 y.o. female with a history of diabetes on oral glycemic agents, hypertension, hyperlipidemia who presents for evaluation of elevated blood glucose.  Patient reports that she has been feeling unwell with abdominal pain, dizziness, and diarrhea for several days.  She kept checking her sugars at home which were always normal.  She was concerned that her machine was not working so she called her PCP for a new machine.  When she got her machine yesterday she checked her sugar which read as "high". This morning BG continue to be high.  Patient is complaining of diffuse crampy abdominal pain and diarrhea.  She reports that she has diarrhea every time she eats.  The diarrhea is watery with no melena.  She has had nausea but no vomiting.  Currently she reports that her abdominal pain is 2 out of 10.  She denies dysuria or hematuria.  She reports that she felt warm yesterday and she thought she had a fever. No cough or URI sx, no CP or SOB.  No recent antibiotic use or history of C. difficile.  Past Medical History:  Diagnosis Date  . Altered mental status 02/04/2014  . Arthritis   . Colon polyps   . Depression   . Diabetes mellitus without complication ([place])   . Hypercholesteremia   . Hyperlipidemia   . Hypertension   . Shingles    10/17  . Spine disorder   . Viral upper respiratory illness 12/03/2015    Patient Active Problem List   Diagnosis Date Noted  . Nodule of finger 10/15/2017  . Seizure ([place]) 07/20/2017  . Upper respiratory infection 07/20/2017  . Superficial burn 11/06/2016  . Fatigue 11/06/2016  . Shingles rash over at the left V1 distribution 06/24/2016  . Trigeminal herpes zoster (left V1  distribution) 06/24/2016  . Chest pain 01/02/2016  . Tobacco abuse 10/30/2015  . Hypomagnesemia 10/22/2015  . Osteoarthritis of hip (Location of Tertiary source of pain) (Bilateral) (L>R) 10/21/2015  . Diabetic peripheral neuropathy ([place]) 10/21/2015  . Chronic low back pain (Location of Primary Source of Pain) (Bilateral) (L>R) 06/18/2015  . Essential (primary) hypertension 05/21/2015  . Hyperlipidemia 02/13/2014  . Anxiety and depression 02/04/2014  . Type 2 diabetes mellitus ([place]) 02/04/2014    Past Surgical History:  Procedure Laterality Date  . ABDOMINAL HYSTERECTOMY    . BACK SURGERY    . CHOLECYSTECTOMY    . KYPHOPLASTY      Prior to Admission medications   Medication Sig Start Date End Date Taking? Authorizing Provider  acetaminophen (RA ACETAMINOPHEN) 650 MG CR tablet Take 1,300 mg by mouth daily.     [provider]  amLODipine (NORVASC) 5 MG tablet Take 1 tablet (5 mg total) by mouth daily. 10/19/17   Leone Haven, MD  aspirin EC 81 MG tablet Take 1 tablet (81 mg total) by mouth daily. 01/16/16   Wende Bushy, MD  atorvastatin (LIPITOR) 40 MG tablet Take 1 tablet (40 mg total) by mouth daily. 10/19/17   Leone Haven, MD  blood glucose meter kit and supplies Dispense One touch meter.  E11.9 10/03/15   Leone Haven, MD  Blood Glucose Monitoring Suppl (ONE TOUCH ULTRA 2) w/Device KIT Dispense 1 meter to use to test  blood glucose once daily. Dx code: E11.42. 01/03/18   Leone Haven, MD  gabapentin (NEURONTIN) 800 MG tablet Take 1 tablet (800 mg total) by mouth every 6 (six) hours. 06/19/17 12/16/17  Leone Haven, MD  glipiZIDE (GLUCOTROL) 5 MG tablet Take 1 tablet (5 mg total) by mouth 2 (two) times daily before a meal. 03/10/17   Leone Haven, MD  glucose blood (ONE TOUCH ULTRA TEST) test strip Use as directed to test blood glucose once daily. Dx code: E11.42. 01/03/18   Leone Haven, MD  hydrochlorothiazide (HYDRODIURIL) 25 MG tablet  TAKE 1 TABLET BY MOUTH EVERY DAY 11/10/17   Leone Haven, MD  Lancets [facility] ULTRASOFT) lancets Use as instructed to test blood glucose once daily. Dx code: E11.42. 01/03/18   Leone Haven, MD  lisinopril (PRINIVIL,ZESTRIL) 40 MG tablet TAKE 1 TABLET BY MOUTH EVERY DAY 12/08/17   Leone Haven, MD  metFORMIN (GLUCOPHAGE) 1000 MG tablet TAKE 1 TABLET BY MOUTH TWICE DAILY WITH A MEAL 12/08/17   Leone Haven, MD  naproxen sodium (ALEVE) 220 MG tablet Take 220 mg by mouth 2 (two) times daily.    [provider]  nitroGLYCERIN (NITROSTAT) 0.4 MG SL tablet Place 1 tablet (0.4 mg total) under the tongue every 5 (five) minutes as needed for chest pain. 01/16/16   Wende Bushy, MD  ondansetron (ZOFRAN ODT) 4 MG disintegrating tablet Take 1 tablet (4 mg total) by mouth every 8 (eight) hours as needed for nausea or vomiting. 01/06/18   Alfred Levins, [place], MD  sertraline (ZOLOFT) 100 MG tablet Take 1 tablet (100 mg total) by mouth daily. 09/08/17   Leone Haven, MD  tiZANidine (ZANAFLEX) 4 MG tablet TAKE 1 TABLET BY MOUTH THREE TIMES DAILY 11/10/17   Leone Haven, MD    Allergies Penicillins and Tetracyclines & related  Family History  Problem Relation Age of Onset  . Heart disease Mother   . Hypertension Mother   . Cancer Mother        breast  . Heart disease Father   . Arthritis Unknown        Parent, other relative  . Colon cancer Unknown        Parent  . Breast cancer Unknown        Parent  . Lung cancer Unknown        Other relative  . Diabetes Unknown        Other relative  . Uterine cancer Unknown        Other relative  . Cancer Maternal Aunt        breast  . Cancer Maternal Aunt        ovarian    Social History Social History   Tobacco Use  . Smoking status: Current Some Day Smoker    Packs/day: 0.25    Years: 40.00    Pack years: 10.00    Types: Cigarettes  . Smokeless tobacco: Never Used  . Tobacco comment: smokes when stressed  "Not many"  Substance Use Topics  . Alcohol use: No  . Drug use: No    Review of Systems  Constitutional: + subjective fever, and dizziness Eyes: Negative for visual changes. ENT: Negative for sore throat. Neck: No neck pain  Cardiovascular: Negative for chest pain. Respiratory: Negative for shortness of breath. Gastrointestinal: + abdominal pain, nausea, and  Diarrhea. No vomiting Genitourinary: Negative for dysuria. Musculoskeletal: Negative for back pain. Skin: Negative for rash. Neurological: Negative for  headaches, weakness or numbness. Psych: No SI or HI  ____________________________________________   PHYSICAL EXAM:  VITAL SIGNS: ED Triage Vitals  Enc Vitals Group     BP 01/06/18 1850 113/63     Pulse Rate 01/06/18 1850 84     Resp 01/06/18 1850 16     Temp 01/06/18 1850 98.8 F (37.1 C)     Temp Source 01/06/18 1850 Oral     SpO2 01/06/18 1850 96 %     Weight 01/06/18 1852 156 lb (70.8 kg)     Height 01/06/18 1852 5' 2"  (1.575 m)     Head Circumference --      Peak Flow --      Pain Score 01/06/18 1852 0     Pain Loc --      Pain Edu? --      Excl. in [place]? --     Constitutional: Alert and oriented. Well appearing and in no apparent distress. HEENT:      Head: Normocephalic and atraumatic.         Eyes: Conjunctivae are normal. Sclera is non-icteric.       Mouth/Throat: Mucous membranes are moist.       Neck: Supple with no signs of meningismus. Cardiovascular: Regular rate and rhythm. No murmurs, gallops, or rubs. 2+ symmetrical distal pulses are present in all extremities. No JVD. Respiratory: Normal respiratory effort. Lungs are clear to auscultation bilaterally. No wheezes, crackles, or rhonchi.  Gastrointestinal: Soft, non tender, and non distended with positive bowel sounds. No rebound or guarding. Musculoskeletal: Nontender with normal range of motion in all extremities. No edema, cyanosis, or erythema of extremities. Neurologic: Normal speech and  language. Face is symmetric. Moving all extremities. No gross focal neurologic deficits are appreciated. Skin: Skin is warm, dry and intact. No rash noted. Psychiatric: Mood and affect are normal. Speech and behavior are normal.  ____________________________________________   LABS (all labs ordered are listed, but only abnormal results are displayed)  Labs Reviewed  BASIC METABOLIC PANEL - Abnormal; Notable for the following components:      Result Value   Sodium 123 (*)    Chloride 86 (*)    Glucose, Bld 605 (*)    BUN 40 (*)    Creatinine, Ser 1.50 (*)    GFR calc non Af Amer 36 (*)    GFR calc Af Amer 42 (*)    All other components within normal limits  CBC - Abnormal; Notable for the following components:   RDW 14.6 (*)    All other components within normal limits  URINALYSIS, COMPLETE (UACMP) WITH MICROSCOPIC - Abnormal; Notable for the following components:   Color, Urine STRAW (*)    APPearance CLEAR (*)    Glucose, UA >=500 (*)    Bacteria, UA RARE (*)    All other components within normal limits  GLUCOSE, CAPILLARY - Abnormal; Notable for the following components:   Glucose-Capillary >600 (*)    All other components within normal limits  GLUCOSE, CAPILLARY - Abnormal; Notable for the following components:   Glucose-Capillary 592 (*)    All other components within normal limits  LIPASE, BLOOD - Abnormal; Notable for the following components:   Lipase 76 (*)    All other components within normal limits  HEPATIC FUNCTION PANEL - Abnormal; Notable for the following components:   Bilirubin, Direct <0.1 (*)    All other components within normal limits  GLUCOSE, CAPILLARY - Abnormal; Notable for the following components:  Glucose-Capillary 390 (*)    All other components within normal limits  GLUCOSE, CAPILLARY - Abnormal; Notable for the following components:   Glucose-Capillary 219 (*)    All other components within normal limits  BLOOD GAS, VENOUS  TROPONIN I    CBG MONITORING, ED   ____________________________________________  EKG  ED ECG REPORT I, Rudene Re, the attending physician, personally viewed and interpreted this ECG.  Normal sinus rhythm, rate of 75, normal intervals, normal axis, no ST elevations or depressions.  Normal EKG. ____________________________________________  RADIOLOGY  none ____________________________________________   PROCEDURES  Procedure(s) performed: None Procedures Critical Care performed:  None ____________________________________________   INITIAL IMPRESSION / ASSESSMENT AND PLAN / ED COURSE   62 y.o. female with a history of diabetes on oral glycemic agents, hypertension, hyperlipidemia who presents for evaluation of elevated blood glucose in the setting of 2 days of crampy abdominal pain, diarrhea, subjective fevers. Patient is not on insulin but has an appointment on Monday with an endocrinologist.  Abdomen is soft with mild suprapubic ttp, vitals are within normal limits.  Will check labs for any evidence of DKA. Low suspicion for C. Diff since diarrhea does not seem to be as frequent based on history.  Will check for dehydration.  Will check lipase LFTs and CBC.  At this time patient has mild tenderness suprapubically, will check UA for UTI. No RLQ ttp.    _________________________ 11:37 PM on 01/06/2018 ----------------------------------------- Labs showing mildly elevated lipase consistent with mild pancreatitis.  Patient reports that her pain is fully resolved at this time.  She is tolerating p.o. with no nausea or vomiting.  Her repeat blood glucose is 219.  Patient is status post cholecystectomy.  She does not drink.  I explained to her that she will need an outpatient CT scan to evaluate for possible pancreatic mass as an alternative etiology for her pancreatitis.  Discussed return precautions for pain that is not well controlled or nausea and vomiting.  Patient will follow-up with her  primary care doctor tomorrow morning to be started on insulin.    As part of my medical decision making, I reviewed the following data within the [place] notes reviewed and incorporated, Labs reviewed , EKG interpreted  Old EKG reviewed, Old chart reviewed, Notes from prior ED visits and [place] Controlled Substance Database    Pertinent labs & imaging results that were available during my care of the patient were reviewed by me and considered in my medical decision making (see chart for details).    ____________________________________________   FINAL CLINICAL IMPRESSION(S) / ED DIAGNOSES  Final diagnoses:  Hyperglycemia  Acute pancreatitis, unspecified complication status, unspecified pancreatitis type      NEW MEDICATIONS STARTED DURING THIS VISIT:  ED Discharge Orders        Ordered    ondansetron (ZOFRAN ODT) 4 MG disintegrating tablet  Every 8 hours PRN     01/06/18 2345       Note:  This document was prepared using Dragon voice recognition software and may include unintentional dictation errors.    Rudene Re, MD 01/06/18 [phone]

## 2018-01-06 NOTE — ED Notes (Signed)
Report given to Vanessa RN.

## 2018-01-06 NOTE — ED Triage Notes (Signed)
Pt to ED via POV. Pt states that her blood sugar has been high for "a while". Pt states that her meter has been reading "high". Pt states that she is having abdominal pain and is unable to eat without her stomach hurting. Pt also c/o diarrhea. Pt is in NAD at this time.

## 2018-01-06 NOTE — Discharge Instructions (Signed)
Please make sure to call your doctor tomorrow to be started on insulin.  Take Zofran as needed for nausea.  You will need a CT scan within a week to evaluate for possible causes of your pancreatitis which include cancer. Pls make sure to discuss that with your doctor.  Return to the emergency room for new or worsening abdominal pain, several episodes of vomiting.

## 2018-01-06 NOTE — Telephone Encounter (Signed)
Patient called in with c/o "high blood sugar." She says "my meter has been broken, it was not registering my blood sugar. I spoke to [place] on Monday and she ordered me a new meter. I checked it yesterday and it was registering >600. I feel bad and have been feeling this way all week." I asked why did she wait until now to call, she says "I was trying to wait until my appointment on Monday to see [place] Citizen." I asked about her symptoms, she says "diarrhea, dizzy, weakness (can hardly walk), difficulty breathing." According to protocol, go to ED. Patient advised to go to ED, she says "I will call my son to come take me. He's on his way from work." Care advice given, patient verbalized understanding.  Reason for Disposition . Blood glucose > 500 mg/dl (16.1 mmol/l)  Answer Assessment - Initial Assessment Questions 1. BLOOD GLUCOSE: "What is your blood glucose level?"      Over 600 on meter 2. ONSET: "When did you check the blood glucose?"     Yesterday 3. USUAL RANGE: "What is your glucose level usually?" (e.g., usual fasting morning value, usual evening value)     I don't know because my meter was bad 4. KETONES: "Do you check for ketones (urine or blood test strips)?" If yes, ask: "What does the test show now?"     N/A 5. TYPE 1 or 2:  "Do you know what type of diabetes you have?"  (e.g., Type 1, Type 2, Gestational; doesn't know)      Type 2 6. INSULIN: "Do you take insulin?" If yes, ask: "Have you missed any shots recently?"     No 7. DIABETES PILLS: "Do you take any pills for your diabetes?" If yes, ask: "Have you missed taking any pills recently?"     Yes 8. OTHER SYMPTOMS: "Do you have any symptoms?" (e.g., fever, frequent urination, difficulty breathing, dizziness, weakness, vomiting)     Weakness, diarrhea, dizzy, difficulty breathing 9. PREGNANCY: "Is there any chance you are pregnant?" "When was your last menstrual period?"    No  Protocols used: DIABETES - HIGH BLOOD  SUGAR-A-AH

## 2018-01-07 ENCOUNTER — Other Ambulatory Visit: Payer: Self-pay | Admitting: Family Medicine

## 2018-01-07 ENCOUNTER — Ambulatory Visit (INDEPENDENT_AMBULATORY_CARE_PROVIDER_SITE_OTHER): Payer: Medicare Other | Admitting: Family Medicine

## 2018-01-07 ENCOUNTER — Telehealth: Payer: Self-pay | Admitting: Family Medicine

## 2018-01-07 ENCOUNTER — Encounter: Payer: Self-pay | Admitting: Family Medicine

## 2018-01-07 VITALS — BP 116/70 | HR 91 | Temp 98.0°F | Wt 155.6 lb

## 2018-01-07 DIAGNOSIS — R748 Abnormal levels of other serum enzymes: Secondary | ICD-10-CM

## 2018-01-07 DIAGNOSIS — M792 Neuralgia and neuritis, unspecified: Secondary | ICD-10-CM

## 2018-01-07 DIAGNOSIS — B0222 Postherpetic trigeminal neuralgia: Secondary | ICD-10-CM

## 2018-01-07 DIAGNOSIS — E1142 Type 2 diabetes mellitus with diabetic polyneuropathy: Secondary | ICD-10-CM

## 2018-01-07 DIAGNOSIS — Z87898 Personal history of other specified conditions: Secondary | ICD-10-CM

## 2018-01-07 DIAGNOSIS — J309 Allergic rhinitis, unspecified: Secondary | ICD-10-CM | POA: Diagnosis not present

## 2018-01-07 DIAGNOSIS — K859 Acute pancreatitis without necrosis or infection, unspecified: Secondary | ICD-10-CM | POA: Diagnosis not present

## 2018-01-07 LAB — COMPREHENSIVE METABOLIC PANEL
ALBUMIN: 3.8 g/dL (ref 3.5–5.2)
ALT: 14 U/L (ref 0–35)
AST: 13 U/L (ref 0–37)
Alkaline Phosphatase: 85 U/L (ref 39–117)
BUN: 25 mg/dL — ABNORMAL HIGH (ref 6–23)
CALCIUM: 9.2 mg/dL (ref 8.4–10.5)
CHLORIDE: 98 meq/L (ref 96–112)
CO2: 24 mEq/L (ref 19–32)
CREATININE: 1.12 mg/dL (ref 0.40–1.20)
GFR: 52.25 mL/min — ABNORMAL LOW (ref >60.00)
Glucose, Bld: 382 mg/dL — ABNORMAL HIGH (ref 70–99)
POTASSIUM: 4.8 meq/L (ref 3.5–5.1)
Sodium: 132 mEq/L — ABNORMAL LOW (ref 135–145)
Total Bilirubin: 0.3 mg/dL (ref 0.2–1.2)
Total Protein: 7.4 g/dL (ref 6.0–8.3)

## 2018-01-07 LAB — GLUCOSE, CAPILLARY: GLUCOSE-CAPILLARY: 592 mg/dL — AB (ref 65–99)

## 2018-01-07 LAB — LIPASE: LIPASE: 82 U/L — AB (ref 11.0–59.0)

## 2018-01-07 LAB — GLUCOSE, POCT (MANUAL RESULT ENTRY): POC Glucose: 394 mg/dl — AB (ref 70–99)

## 2018-01-07 MED ORDER — BASAGLAR KWIKPEN 100 UNIT/ML ~~LOC~~ SOPN: 10.0000 [IU] | PEN_INJECTOR | Freq: Every day | SUBCUTANEOUS | 0 refills | Status: DC

## 2018-01-07 NOTE — Patient Instructions (Signed)
Nice to see you. We are going to start on 10 units of basiglar.  Use this once daily.  Please monitor your blood sugars on this.  We will have you see Rayfield Citizen on Monday. If your abdominal pain worsens or you become more ill or your blood sugar goes up again please be evaluated over the weekend.

## 2018-01-07 NOTE — Telephone Encounter (Signed)
Will Dr. Birdie Sons work her in today for elevated blood sugars patient was seen at ER last night. Please advise.

## 2018-01-07 NOTE — Telephone Encounter (Signed)
Patient is scheduled   

## 2018-01-07 NOTE — Telephone Encounter (Unsigned)
Copied from CRM [id number]. Topic: Appointment Scheduling - Same Day Appointment >> Jan 07, 2018  8:35 AM Mare Loan F wrote: Pt was triaged yesterday by the [facility] and was told to go to the ER which the patient did for elevated blood sugar-pt called today to state that the hospital got it down to the 200s but has gone back up  Into the 300s and was told she needed to see her pcp today Which is Birdie Sons please work in   [employer] number [phone]

## 2018-01-09 DIAGNOSIS — J309 Allergic rhinitis, unspecified: Secondary | ICD-10-CM | POA: Insufficient documentation

## 2018-01-09 DIAGNOSIS — R748 Abnormal levels of other serum enzymes: Secondary | ICD-10-CM | POA: Insufficient documentation

## 2018-01-09 NOTE — Addendum Note (Signed)
Addended by: Glori Luis on: 01/09/2018 09:03 AM   Modules accepted: Orders

## 2018-01-09 NOTE — Assessment & Plan Note (Signed)
Upper respiratory symptoms consistent with allergies.  She will monitor for now while we sort out her other medical issues.

## 2018-01-09 NOTE — Assessment & Plan Note (Signed)
A1c very uncontrolled at last visit.  She was hesitant to start on insulin at that time.  CBGs up into the 600s recently.  Patient is now ready to start on insulin.  We will start her on basiglar 10 units daily.  1 of our LPNs taught the patient how to use the insulin.  She has follow-up with our clinical pharmacist on Monday and she will keep that.

## 2018-01-09 NOTE — Assessment & Plan Note (Signed)
Patient with mildly elevated lipase.  History certainly does fit with mild pancreatitis with epigastric discomfort on eating.  I discussed clear liquid diet.  We will repeat a lipase.  She will need imaging in the near future.  This will be ordered to be scheduled for sometime next week.  She is given return precautions.

## 2018-01-09 NOTE — Progress Notes (Addendum)
Tommi Rumps, MD Phone: [phone]  Tammy Acosta is a 62 y.o. female who presents today for f/u.  CC: Diabetes, ED follow-up  Patient was evaluated in the emergency department the day prior to this office visit for elevated glucose and feeling poorly.  She noted she had not been feeling well for some time and felt her glucometer was not accurate.  She obtained a new glucometer and it started to read as high and would not give her a number.  She felt like she is going to pass out with that.  Had some blurry vision.  She additionally had some epigastric pain while eating.  Had some diarrhea.  She had not gotten out of bed in a couple of days.  No vomiting.  Had some nausea.  No blood in her stool.  Has polydipsia.  No polyuria.  She was seen in the ED and her sugar was found to be over 600.  She was given fluids and insulin.  Her lipase was minimally elevated consistent with mild pancreatitis.  Imaging was not performed.  Since getting discharged from the ED she does feel somewhat better.  Her vision has returned to normal.  She no longer feels like she is going to pass out.  She has been pushing clear liquids and only had pain in her stomach when she ate something this morning.  In the past she has been hesitant to start on insulin though she is ready to do this now.  She reports she has also had some sinus congestion and sneezing and blowing clear mucus out of her nose with some rhinorrhea.  She has a history of allergies.  No fevers.  Social History   Tobacco Use  Smoking Status Current Some Day Smoker  . Packs/day: 0.25  . Years: 40.00  . Pack years: 10.00  . Types: Cigarettes  Smokeless Tobacco Never Used  Tobacco Comment   smokes when stressed "Not many"     ROS see history of present illness  Objective  Physical Exam Vitals:   01/07/18 1310  BP: 116/70  Pulse: 91  Temp: 98 F (36.7 C)  SpO2: 98%    BP Readings from Last 3 Encounters:  01/07/18 116/70    01/06/18 131/80  10/15/17 (!) 142/80   Wt Readings from Last 3 Encounters:  01/07/18 155 lb 9.6 oz (70.6 kg)  01/06/18 156 lb (70.8 kg)  10/15/17 164 lb 6.4 oz (74.6 kg)    Physical Exam  Constitutional: No distress.  HENT:  Head: Normocephalic and atraumatic.  Mouth/Throat: Oropharynx is clear and moist.  Eyes: Pupils are equal, round, and reactive to light. Conjunctivae are normal.  Cardiovascular: Normal rate, regular rhythm and normal heart sounds.  Pulmonary/Chest: Effort normal and breath sounds normal.  Abdominal: Soft. Bowel sounds are normal. She exhibits no distension and no mass. There is tenderness (Minimal epigastric). There is no rebound and no guarding.  Musculoskeletal: She exhibits no edema.  Neurological: She is alert.  Skin: Skin is warm and dry. She is not diaphoretic.     Assessment/Plan: Please see individual problem list.  Type 2 diabetes mellitus ([place]) A1c very uncontrolled at last visit.  She was hesitant to start on insulin at that time.  CBGs up into the 600s recently.  Patient is now ready to start on insulin.  We will start her on basiglar 10 units daily.  1 of our LPNs taught the patient how to use the insulin.  She has follow-up with our  clinical pharmacist on Monday and she will keep that.  Elevated lipase Patient with mildly elevated lipase.  History certainly does fit with mild pancreatitis with epigastric discomfort on eating.  I discussed clear liquid diet.  We will repeat a lipase.  She will need imaging in the near future.  This will be ordered to be scheduled for sometime next week.  She is given return precautions.  Allergic rhinitis Upper respiratory symptoms consistent with allergies.  She will monitor for now while we sort out her other medical issues.  Health maintenance: Patient with history of prior abnormal mammogram with last mammogram in 2008.  Follow-up mammogram and ultrasound previously ordered though not scheduled.  We will  reorder these and have them scheduled.  Orders Placed This Encounter  Procedures  . MM DIAG BREAST TOMO BILATERAL    Standing Status:   Future    Standing Expiration Date:   03/12/2019    Order Specific Question:   Reason for Exam (SYMPTOM  OR DIAGNOSIS REQUIRED)    Answer:   Prior abnormal mammogram    Order Specific Question:   Preferred imaging location?    Answer:   [facility]  . US BREAST LTD UNI LEFT INC AXILLA    Standing Status:   Future    Standing Expiration Date:   03/12/2019    Order Specific Question:   Reason for Exam (SYMPTOM  OR DIAGNOSIS REQUIRED)    Answer:   Prior abnormal mammogram    Order Specific Question:   Preferred imaging location?    Answer:   [facility]  . US BREAST LTD UNI RIGHT INC AXILLA    Standing Status:   Future    Standing Expiration Date:   03/12/2019    Order Specific Question:   Reason for Exam (SYMPTOM  OR DIAGNOSIS REQUIRED)    Answer:   Prior abnormal mammogram    Order Specific Question:   Preferred imaging location?    Answer:   [facility]  . CT Abdomen Pelvis W Contrast    Standing Status:   Future    Standing Expiration Date:   04/12/2019    Order Specific Question:   If indicated for the ordered procedure, I authorize the administration of contrast media per Radiology protocol    Answer:   Yes    Order Specific Question:   Preferred imaging location?    Answer:   [facility]    Order Specific Question:   Is Oral Contrast requested for this exam?    Answer:   Yes, Per Radiology protocol    Order Specific Question:   Radiology Contrast Protocol - do NOT remove file path    Answer:   [email or web address]  . Comp Met (CMET)  . Lipase  . POCT Glucose (CBG)    Meds ordered this encounter  Medications  . Insulin Glargine (BASAGLAR KWIKPEN) 100 UNIT/ML SOPN    Sig: Inject 0.1 mLs (10 Units total) into the skin at bedtime.    Dispense:  3 mL    Refill:  0     Tommi Rumps,  MD [place]

## 2018-01-10 ENCOUNTER — Ambulatory Visit (INDEPENDENT_AMBULATORY_CARE_PROVIDER_SITE_OTHER): Payer: Medicare Other | Admitting: Pharmacist

## 2018-01-10 ENCOUNTER — Other Ambulatory Visit: Payer: Self-pay

## 2018-01-10 ENCOUNTER — Encounter: Payer: Self-pay | Admitting: Pharmacist

## 2018-01-10 VITALS — BP 108/62 | Wt 154.0 lb

## 2018-01-10 DIAGNOSIS — I10 Essential (primary) hypertension: Secondary | ICD-10-CM | POA: Diagnosis not present

## 2018-01-10 DIAGNOSIS — E1142 Type 2 diabetes mellitus with diabetic polyneuropathy: Secondary | ICD-10-CM | POA: Diagnosis not present

## 2018-01-10 DIAGNOSIS — E785 Hyperlipidemia, unspecified: Secondary | ICD-10-CM

## 2018-01-10 DIAGNOSIS — B0222 Postherpetic trigeminal neuralgia: Secondary | ICD-10-CM

## 2018-01-10 DIAGNOSIS — M792 Neuralgia and neuritis, unspecified: Secondary | ICD-10-CM

## 2018-01-10 MED ORDER — PEN NEEDLES 32G X 5 MM MISC: 1.0000 "pen " | Freq: Every day | 4 refills | Status: AC

## 2018-01-10 MED ORDER — GABAPENTIN 800 MG PO TABS: 800.0000 mg | ORAL_TABLET | Freq: Three times a day (TID) | ORAL | 1 refills | Status: DC

## 2018-01-10 NOTE — Patient Instructions (Addendum)
Thank you for coming in to see [place] today! We are going to work together to get your sugars down consistently to the 200s.   1. Increase your Basaglar to 12 units injected every morning. Start tomorrow morning.  2. Continue checking your blood sugar before you eat, and you can check a second time randomly throughout the day. 3. Stop taking your glipizide after tonight.  4. Some diet changes we talked about were to decrease your carbohydrate (pasta, bread, potatoes) intake to about a fistful a serving. If you want to eat a sandwich, use 1 piece of bread. You can also eat as many vegetables as you can get ahold of (frozen vegetables are OK if you can't afford fresh). Meat is OK to eat, as long as it's not fried.  5. We have called in a prescription for pen needles to the [facility] on [address]  We will follow up with you via phone call in the next week and adjust your insulin if we need to. Come see me in clinic in 4 weeks.

## 2018-01-10 NOTE — Telephone Encounter (Signed)
Last OV 01/07/18 last filled 06/19/17 360 1rf

## 2018-01-10 NOTE — Progress Notes (Signed)
S:     Chief Complaint  Patient presents with  . Medication Management    DM    Patient arrives in fair spirits, ambulating without assistance.  Presents for diabetes evaluation, education, and management at the request of Dr. Caryl Bis (referred on 11/02/17), after POCT A1C returned significantly elevated. Last seen by primary care provider on 01/07/18 - at that time patient was seen for follow up post ED visit (01/06/18, severe hyperglycemia, mild pancreatitis). Dr. Caryl Bis initiated basal insulin, and ensured that she would attend her scheduled Rx clinic visit to further adjust insulin.   Patient arrives expressing displeasure that despite initiation of basal insulin (Basaglar), she is unable to get her blood sugars below 290. She is worried about high blood sugars and does not want to go to the hospital again. Patient lives alone, within minutes of her son. She moved from [facility] after he insisted that she live nearby him and his family (wife, twin sons, 7-8).    Patient reports Diabetes was diagnosed ~6 years ago, in 2013.   Family/Social History: great aunt had DM Smoker, currently smokes 1/2-1 pack per week (2-4 cigarettes/day), unknown pack year history. Not interested in quitting at this time but expresses that she understands she needs to quit for health benefits. Patient attributes life stress as her reason for continuing smoking.   Insurance coverage/medication affordability: [employer], expresses extreme financial stress. Brings in income documentation for discussion.   Patient reports adherence with medications.  Current diabetes medications include: glipizide 5 mg BID, Basaglar (insulin glargine) 10 units injected daily, metformin 1000 mg BID  Current hypertension medications include: amlodipine 5 mg daily, HCTZ 25 daily, lisinopril 40 mg daily  Patient denies hypoglycemic events.   Patient reported dietary habits: Eats 1 meals/day Breakfast:  honey nut cheerios with 2% milk Lunch: none Dinner: whatever she can get from the food bank, green beans, corn, chef [employer]ardee, mac and cheese Snacks: strawberries, pineapple, watermelon Drinks: water, sweet tea Patient indicates that she cannot afford to buy food from the grocery store, so her diet is poor. She obtains whatever food she can get from the food bank [employer]). Her son helps her out when she needs, but she is reluctant to accept too much help. She adamantly denies eating fast food. Patient is visibly disheartened with her inability to afford "diabetes friendly" diet.    Patient reported exercise habits: limited by back pain (h/o back surgery); does PT exercises that she was taught when she was recovering from back surgery, does yard work.     Patient denies nocturia. Patient says that she does not sleep. She is unable to get to sleep, wakes up 3-4 times throughout the night.  Patient denies pain/burning on urination.  Patient reports neuropathy in feet, bottom of the soles of her feet; occasional tingling in the fingertips, resolved with an arm shake.  Patient reports visual changes, blurry vision, dizziness, and difficulty balancing.  Patient reports self foot exams.     O:  Physical Exam  Constitutional: She appears well-developed and well-nourished.     Review of Systems  All other systems reviewed and are negative.    Lab Results  Component Value Date   HGBA1C 13.2 11/02/2017   Vitals:   01/10/18 1406  BP: 108/62    Lipid Panel     Component Value Date/Time   CHOL 288 (H) 07/20/2017 1101   TRIG (H) 07/20/2017 1101    710.0 Triglyceride is over 400; calculations  on Lipids are invalid.   HDL 35.30 (L) 07/20/2017 1101   CHOLHDL 8 07/20/2017 1101   LDLDIRECT 107.0 07/20/2017 1101    Home fasting CBG: 290-400s, excursions to "HIGH"     Clinical ASCVD: NO; Major ASCVD events: none High-risk conditions: Age 5 or older, DM, HTN, current  smoking  ASCVD risk factors: age 47-75  A/P: Patient is not a candidate for LIS/SS benefits, as her monthly income is >$1500.    #Diabetes longstanding currently uncontrolled with A1c significantly elevated and recent hospital visit for BG >600. Patient denies hypoglycemic events and is able to verbalize appropriate hypoglycemia management plan. Patient reports adherence with medication. Control is suboptimal due to dietary indiscretions, lact of exercise, suspected insulin resistance, and suboptimal therapy. - Increased Basaglar to 12 units injected every morning. - Discontinued glipizide - Spent significant time counseling patient on pathophysiology, mechanisms, and complications of diabetes, mechanisms of antidiabetic medications, detection and managenent of hypoglycemia, and dietary changes that she could realistically make given her financial situation.  - Goal is to have FBGs in the 200s - Next A1C anticipated 02/12/18 or later.    #ASCVD risk - primary prevention in patient aged 31-75 with DM, baseline LDL 70-189, moderate intensity statin indicated. Triglycerides likely elevated due to uncontrolled diabetes.  - Continued Aspirin 81 mg  - Continued atorvastatin 40 mg.   #Hypertension longstanding currently controlled, with in office BP <130/80.  Patient reports adherence with medication. - Continue amlodipine 5 mg daily, hctz 25 mg daily, and lisinopril 40 mg daily   Written patient instructions provided.  Total time in face to face counseling 60 minutes.    Will follow up with patient via phone call in 1-2 weeks. Follow up in Pharmacist Clinic Visit 4 weeks.   Patient seen with Cleotis Lema, PharmD Candidate  Carlean Jews, Pharm.D., BCPS, CPP PGY2 Ambulatory Care Pharmacy Resident Phone: [phone]

## 2018-01-10 NOTE — Progress Notes (Signed)
Patient states she has not had a pap smear in a long time, she does not want a pap at this time. Patient states her colonoscopy has been a couple of years. She does not remember where the last one was done. I will schedule mammogram

## 2018-01-10 NOTE — Progress Notes (Signed)
It looks like she has order in for diagnostic mammogram? This will need to be scheduled by [facility]

## 2018-01-11 NOTE — Assessment & Plan Note (Signed)
#  Hypertension longstanding currently controlled, with in office BP <130/80.  Patient reports adherence with medication. - Continue amlodipine 5 mg daily, hctz 25 mg daily, and lisinopril 40 mg daily

## 2018-01-11 NOTE — Progress Notes (Signed)
I have reviewed the above note and agree. I was available to the pharmacist for consultation.  Dago Jungwirth, MD  

## 2018-01-11 NOTE — Assessment & Plan Note (Signed)
#  Diabetes longstanding currently uncontrolled with A1c significantly elevated and recent hospital visit for BG >600. Patient denies hypoglycemic events and is able to verbalize appropriate hypoglycemia management plan. Patient reports adherence with medication. Control is suboptimal due to dietary indiscretions, lact of exercise, suspected insulin resistance, and suboptimal therapy. - Increased Basaglar to 12 units injected every morning. - Discontinued glipizide - Spent significant time counseling patient on pathophysiology, mechanisms, and complications of diabetes, mechanisms of antidiabetic medications, detection and managenent of hypoglycemia, and dietary changes that she could realistically make given her financial situation.  - Goal is to have FBGs in the 200s - Next A1C anticipated 02/12/18 or later.

## 2018-01-11 NOTE — Assessment & Plan Note (Signed)
 #  ASCVD risk - primary prevention in patient aged 63-75 with DM, baseline LDL 70-189, moderate intensity statin indicated. Triglycerides likely elevated due to uncontrolled diabetes.  - Continued Aspirin 81 mg  - Continued atorvastatin 40 mg.

## 2018-01-12 ENCOUNTER — Ambulatory Visit (INDEPENDENT_AMBULATORY_CARE_PROVIDER_SITE_OTHER): Payer: Medicare Other | Admitting: Family Medicine

## 2018-01-12 ENCOUNTER — Encounter: Payer: Self-pay | Admitting: Family Medicine

## 2018-01-12 VITALS — BP 106/62 | HR 74 | Temp 98.5°F | Ht 61.0 in | Wt 155.8 lb

## 2018-01-12 DIAGNOSIS — E1142 Type 2 diabetes mellitus with diabetic polyneuropathy: Secondary | ICD-10-CM | POA: Diagnosis not present

## 2018-01-12 DIAGNOSIS — T300 Burn of unspecified body region, unspecified degree: Secondary | ICD-10-CM

## 2018-01-12 DIAGNOSIS — R1013 Epigastric pain: Secondary | ICD-10-CM

## 2018-01-12 DIAGNOSIS — R748 Abnormal levels of other serum enzymes: Secondary | ICD-10-CM | POA: Diagnosis not present

## 2018-01-12 DIAGNOSIS — R569 Unspecified convulsions: Secondary | ICD-10-CM

## 2018-01-12 DIAGNOSIS — Z1211 Encounter for screening for malignant neoplasm of colon: Secondary | ICD-10-CM

## 2018-01-12 LAB — LIPASE: LIPASE: 88 U/L — AB (ref 11.0–59.0)

## 2018-01-12 NOTE — Progress Notes (Signed)
  [name], MD Phone: [phone]  Tammy Acosta is a 62 y.o. female who presents today for f/u.  CC: pancreatitis, DM, seizure  DIABETES Disease Monitoring: Blood Sugar ranges-213 this am Polyuria/phagia/dipsia- improved quite a bit on insulin      Optho- due Medications: Compliance- taking metformin, basaglar Hypoglycemic symptoms- no  Abdominal pain: This has improved.  Mild pancreatitis previously.  No nausea or vomiting.  No blood in her stool.  No diarrhea.  She had 2 seizures late last year after suddenly self discontinuing her gabapentin which is used for peripheral neuropathy.  She subsequently resumed it and has not had any additional issues since then.  No seizure activity.  Does have a family history of seizures in her father.  She did not see neurology due to cost concerns.  Social History   Tobacco Use  Smoking Status Current Some Day Smoker  . Packs/day: 0.25  . Years: 40.00  . Pack years: 10.00  . Types: Cigarettes  Smokeless Tobacco Never Used  Tobacco Comment   smokes when stressed "Not many"     ROS see history of present illness  Objective  Physical Exam Vitals:   01/12/18 1415  BP: 106/62  Pulse: 74  Temp: 98.5 F (36.9 C)  SpO2: 94%    BP Readings from Last 3 Encounters:  01/12/18 106/62  01/10/18 108/62  01/07/18 116/70   Wt Readings from Last 3 Encounters:  01/12/18 155 lb 12.8 oz (70.7 kg)  01/10/18 154 lb (69.9 kg)  01/07/18 155 lb 9.6 oz (70.6 kg)    Physical Exam  Constitutional: No distress.  Cardiovascular: Normal rate, regular rhythm and normal heart sounds.  Pulmonary/Chest: Effort normal and breath sounds normal.  Musculoskeletal: She exhibits no edema.  Neurological: She is alert.  CN 2-12 intact, 5/5 strength in bilateral biceps, triceps, grip, quads, hamstrings, plantar and dorsiflexion, sensation to light touch intact in bilateral UE and LE, normal gait  Skin: Skin is warm and dry. She is not diaphoretic.       Assessment/Plan: Please see individual problem list.  Type 2 diabetes mellitus (HCC) Blood glucose has been been improving with insulin.  She will continue on her current dose of basiglar and follow-up with our clinical pharmacist as planned.  Seizure [facility]) It has been greater than 6 months since her seizure episode.  She has remained on gabapentin.  She has not seen neurology.  She has DMV paperwork that needs to be filled out and this will be faxed back to the [facility].  Elevated lipase Likely mild pancreatitis.  Symptoms have resolved.  We will check a lipase.  We will proceed with CT imaging abdomen and pelvis to rule out mass lesion.   Health Maintenance: Refer to GI for colonoscopy.  Orders Placed This Encounter  Procedures  . Lipase  . Ambulatory referral to Gastroenterology    Referral Priority:   Routine    Referral Type:   Consultation    Referral Reason:   Specialty Services Required    Number of Visits Requested:   1    No orders of the defined types were placed in this encounter.    [name], MD [facility]

## 2018-01-12 NOTE — Assessment & Plan Note (Signed)
It has been greater than 6 months since her seizure episode.  She has remained on gabapentin.  She has not seen neurology.  She has DMV paperwork that needs to be filled out and this will be faxed back to the [facility].

## 2018-01-12 NOTE — Assessment & Plan Note (Signed)
Likely mild pancreatitis.  Symptoms have resolved.  We will check a lipase.  We will proceed with CT imaging abdomen and pelvis to rule out mass lesion.

## 2018-01-12 NOTE — Patient Instructions (Signed)
Nice to see you. Please monitor your diet and blood sugars. We will check lab work and contact you with the results. We will get your paperwork filled out as well.

## 2018-01-12 NOTE — Assessment & Plan Note (Signed)
Blood glucose has been been improving with insulin.  She will continue on her current dose of basiglar and follow-up with our clinical pharmacist as planned.

## 2018-01-13 ENCOUNTER — Telehealth: Payer: Self-pay

## 2018-01-13 NOTE — Telephone Encounter (Signed)
Patient brought in [facility] forms to be filled out. I have faxed and mailed forms to [facility]. Notified patient that a copy of the forms are at the front desk for her to pick up. Patient verbalized understanding.

## 2018-01-14 LAB — BLOOD GAS, VENOUS
ACID-BASE DEFICIT: 1.5 mmol/L (ref 0.0–2.0)
BICARBONATE: 24.8 mmol/L (ref 20.0–28.0)
Patient temperature: 37
pCO2, Ven: 47 mmHg (ref 44.0–60.0)
pH, Ven: 7.33 (ref 7.250–7.430)

## 2018-01-18 ENCOUNTER — Encounter: Payer: Self-pay | Admitting: Family Medicine

## 2018-01-18 ENCOUNTER — Ambulatory Visit
Admission: RE | Admit: 2018-01-18 | Discharge: 2018-01-18 | Disposition: A | Discharge status: Home / Self Care | Payer: Medicare Other | Source: Ambulatory Visit | Attending: Family Medicine | Admitting: Family Medicine

## 2018-01-18 ENCOUNTER — Telehealth: Payer: Self-pay | Admitting: Pharmacist

## 2018-01-18 DIAGNOSIS — R109 Unspecified abdominal pain: Secondary | ICD-10-CM | POA: Diagnosis not present

## 2018-01-18 DIAGNOSIS — I700 Atherosclerosis of aorta: Secondary | ICD-10-CM | POA: Insufficient documentation

## 2018-01-18 DIAGNOSIS — K859 Acute pancreatitis without necrosis or infection, unspecified: Secondary | ICD-10-CM | POA: Insufficient documentation

## 2018-01-18 DIAGNOSIS — Z981 Arthrodesis status: Secondary | ICD-10-CM | POA: Diagnosis not present

## 2018-01-18 DIAGNOSIS — M4856XS Collapsed vertebra, not elsewhere classified, lumbar region, sequela of fracture: Secondary | ICD-10-CM | POA: Insufficient documentation

## 2018-01-18 DIAGNOSIS — Z9049 Acquired absence of other specified parts of digestive tract: Secondary | ICD-10-CM | POA: Insufficient documentation

## 2018-01-18 MED ORDER — BASAGLAR KWIKPEN 100 UNIT/ML ~~LOC~~ SOPN: 16.0000 [IU] | PEN_INJECTOR | Freq: Every day | SUBCUTANEOUS | 0 refills | Status: DC

## 2018-01-18 MED ORDER — IOPAMIDOL (ISOVUE-300) INJECTION 61%
100.0000 mL | Freq: Once | INTRAVENOUS | Status: AC | PRN
  Administered 2018-01-18: 100 mL via INTRAVENOUS

## 2018-01-18 NOTE — Telephone Encounter (Signed)
Called patient to follow up on CBGs since last insulin change. Patient reports adherence to Basaglar 12 units daily and metformin 1000 mg BID however she has been taken off metformin until Thursday as she rec'd IV contrast with recent imaging study.   Patient reports she had a CBG high of 402 after memorial day weekend. Reports it has been running in the 200s otherwise. She reads the following CBGs to me:  Fastings: 213, 256, 210, 218 Randoms: 213, 280, 294  Plan:Increase Basaglar to 16 units daily. Will follow up with patient next week over the phone.   Allena Katz, Pharm.D., BCPS PGY2 Ambulatory Care Pharmacy Resident Phone: [phone]

## 2018-01-25 ENCOUNTER — Ambulatory Visit
Admission: RE | Admit: 2018-01-25 | Discharge: 2018-01-25 | Disposition: A | Discharge status: Home / Self Care | Payer: Medicare Other | Source: Ambulatory Visit | Attending: Family Medicine | Admitting: Family Medicine

## 2018-01-25 DIAGNOSIS — Z87898 Personal history of other specified conditions: Secondary | ICD-10-CM

## 2018-01-25 DIAGNOSIS — R928 Other abnormal and inconclusive findings on diagnostic imaging of breast: Secondary | ICD-10-CM | POA: Diagnosis not present

## 2018-01-26 ENCOUNTER — Telehealth: Payer: Self-pay | Admitting: Pharmacist

## 2018-01-26 MED ORDER — BASAGLAR KWIKPEN 100 UNIT/ML ~~LOC~~ SOPN: 20.0000 [IU] | PEN_INJECTOR | Freq: Every day | SUBCUTANEOUS | 0 refills | Status: DC

## 2018-01-26 NOTE — Telephone Encounter (Addendum)
[facility], patient's application is still in process. Requested expedited review. Per representative, unlikely that insulin will arrive to patient before Friday. Discussed with Nida BoatmanJessica Hendricks - will arrange sample for patient.   Patient denies hypoglycemia. Reports the below CBG readings:  Fastings = 180, 189, 165, 273 (wasn't able to take metformin with CT scan) Evenings = 217, 217   PLAN:  Increase Basaglar to 20 units daily Patient to come pick up sample from [place] Patient to follow up with me on 6/17 in person  Allena KatzCaroline E Williette Loewe, Pharm.D., BCPS PGY2 Ambulatory Care Pharmacy Resident Phone: [phone]

## 2018-01-26 NOTE — Telephone Encounter (Signed)
Sample received, label placed on sample. Patient notified sample is ready.

## 2018-02-02 ENCOUNTER — Other Ambulatory Visit: Payer: Self-pay | Admitting: Family Medicine

## 2018-02-02 DIAGNOSIS — M7918 Myalgia, other site: Secondary | ICD-10-CM

## 2018-02-03 NOTE — Telephone Encounter (Signed)
Last OV 01/10/18 last filled 11/10/17 360 0rf

## 2018-02-07 ENCOUNTER — Ambulatory Visit: Payer: Medicare Other | Admitting: Pharmacist

## 2018-02-09 ENCOUNTER — Telehealth: Payer: Self-pay | Admitting: Family Medicine

## 2018-02-09 ENCOUNTER — Telehealth: Payer: Self-pay | Admitting: Pharmacist

## 2018-02-09 NOTE — Telephone Encounter (Signed)
Copied from CRM [id number]. Topic: Quick Communication - See Telephone Encounter >> Feb 09, 2018 10:14 AM Tammy Acosta, Rosey Batheresa D wrote: CRM for notification. See Telephone encounter for: 02/09/18. Patient called and said that she would like to talk to Dr. Rochele RaringSonnenber CMA about getting sample for her insulin since her medication assistant has not gotten approved. Please call patient back, thanks.

## 2018-02-09 NOTE — Telephone Encounter (Signed)
Patient calls as she is going to be out of insulin samples before the weekend. Spoke with [employer] [employer] yesterday. They state that no decision has been made on her application yet, requested expedited review. Regardless, unlikely that insulin will ship to office before Friday. Patient inquires about sample availability. Asked her to call the office and ask to speak with Nida BoatmanJessica Hendricks about this as I am not in the office.  Will route to RobinsJessica.   Allena Katzaroline E Keishia Ground, Pharm.D., BCPS PGY2 Ambulatory Care Pharmacy Resident Phone: [phone]

## 2018-02-09 NOTE — Telephone Encounter (Signed)
Thank you :)

## 2018-02-09 NOTE — Telephone Encounter (Signed)
Yes I will place a label with her info on it and I will let her know

## 2018-02-09 NOTE — Telephone Encounter (Signed)
Patient notified that sample is ready

## 2018-02-09 NOTE — Telephone Encounter (Signed)
Please advise 

## 2018-02-11 ENCOUNTER — Telehealth: Payer: Self-pay | Admitting: Pharmacist

## 2018-02-11 NOTE — Telephone Encounter (Signed)
Patient approved for basaglar via [name] effective 02/09/2018 through 08/23/2018. Product should ship to provider's office. Patient notified. She reports CBGs have improved and she is largely seeing numbers in the 100s. Denies hypoglycemia. Confirms adherent to Basaglar 20 units daily. Will follow up with patient at our appointment on Monday.   Allena Katzaroline E Jaleigh Mccroskey, Pharm.D., BCPS PGY2 Ambulatory Care Pharmacy Resident Phone: [phone]

## 2018-02-14 ENCOUNTER — Ambulatory Visit (INDEPENDENT_AMBULATORY_CARE_PROVIDER_SITE_OTHER): Payer: Medicare Other | Admitting: Pharmacist

## 2018-02-14 ENCOUNTER — Other Ambulatory Visit: Payer: Self-pay | Admitting: Family Medicine

## 2018-02-14 VITALS — BP 96/68 | HR 96 | Wt 153.6 lb

## 2018-02-14 DIAGNOSIS — E1142 Type 2 diabetes mellitus with diabetic polyneuropathy: Secondary | ICD-10-CM

## 2018-02-14 LAB — POCT GLYCOSYLATED HEMOGLOBIN (HGB A1C): Hemoglobin A1C: 11.2 % — AB (ref 4.0–5.6)

## 2018-02-14 MED ORDER — ONETOUCH ULTRASOFT LANCETS MISC: 12 refills | Status: DC

## 2018-02-14 MED ORDER — BASAGLAR KWIKPEN 100 UNIT/ML ~~LOC~~ SOPN: 22.0000 [IU] | PEN_INJECTOR | Freq: Every day | SUBCUTANEOUS | 0 refills | Status: DC

## 2018-02-14 MED ORDER — GLUCOSE BLOOD VI STRP: ORAL_STRIP | 12 refills | Status: DC

## 2018-02-14 NOTE — Progress Notes (Signed)
S:     Chief Complaint  Patient presents with  . Medication Management    Diabetes    Patient arrives in fair spirits, ambulating without assistance but with some difficulty.  Presents for diabetes evaluation, education, and management at the request of Dr. Birdie SonsSonnenberg (referred on 11/02/2017). Last seen by primary care provider on 01/12/2018.    Fell down after slipping on wet steps 1.5 weeks ago and hit right knee. Feels pain on inside of knee after she has been sitting still for a while but can walk. No swelling or bruising. Denies syncope, dizziness, CP.  Denies wanting to see Dr. Birdie SonsSonnenberg about it today, agrees to make appointment if pain does not improve.   Patient reports she needs prescriptions for test strips and lancets. Reports they're usually 160s-180s fasting and  180-200 in the evenings. Giving insulin shots in the mornings. Denies low blood glucose. Reports she feels like she has more energy. Not interested in quitting smoking at this time.  Insurance coverage/medication affordability: Just got approved for [employer] via [employer] patient assistance program. Using samples until shipment comes in.   Patient reports adherence with medications.  Current diabetes medications include: Basaglar 20 units daily, metformin 1000 mg BID Current HTN medications include: amlodipine 5 mg daily, HCTZ 25 mg daily, lisinopril 40 mg daily,   Patient denies hypoglycemic events.  Patient reported dietary habits: Eats what is available   Patient reported exercise habits:    Patient denies nocturia.  Patient denies pain/burning on urination.  Patient reports neuropathy. Improved from previous.  Patient reports visual changes. Blurriness, bilaterally. Improved somewhat.  Patient reports self foot exams. Denies issues.    O:  Physical Exam  Constitutional: She appears well-developed and well-nourished.     Review of Systems  All other systems reviewed and are negative.    Lab  Results  Component Value Date   HGBA1C 11.2 (A) 02/14/2018   Vitals:   02/14/18 1408  BP: 96/68  Pulse: 96    Lipid Panel     Component Value Date/Time   CHOL 288 (H) 07/20/2017 1101   TRIG (H) 07/20/2017 1101    710.0 Triglyceride is over 400; calculations on Lipids are invalid.   HDL 35.30 (L) 07/20/2017 1101   CHOLHDL 8 07/20/2017 1101   LDLDIRECT 107.0 07/20/2017 1101    Clinical ASCVD: YES - atherosclerosis of aorta in problem list; Major ASCVD events: none High-risk conditions: Age 63 or older, DM, HTN, current smoking  ASCVD risk factors: age 63-75  A/P:  #Diabetes longstanding currently uncontrolled with A1c significantly improved however still too high as expected. Patient denies hypoglycemic events and is able to verbalize appropriate hypoglycemia management plan. Patient reports adherence with medication. Control is suboptimal due to dietary indiscretions, lact of exercise, suspected insulin resistance, and suboptimal therapy. -A1C today - Increased Basaglar to 22 units injected every morning. Patient to increase dose by 1 unit/day until fasting BG are in low 100s.  - Continue metformin 1000 mg BID  #ASCVD risk - Secondary prevention in patient aged 63-75 with DM, baseline LDL 70-189, moderate intensity statin indicated. Triglycerides likely elevated due to uncontrolled diabetes.  - Continued Aspirin 81 mg  - Continued atorvastatin 40 mg.   #Hypertension longstanding currently controlled, with in office BP <130/80.  Patient reports adherence with medication. - Continue amlodipine 5 mg daily, hctz 25 mg daily, and lisinopril 40 mg daily. Can consider lowering doses at next visit.   Written patient instructions provided.  Total time in face to face counseling 40 minutes.    Follow up in Pharmacist Clinic Visit as needed.   Allena Katz, Pharm.D., BCPS, CPP PGY2 Ambulatory Care Pharmacy Resident Phone: [phone]

## 2018-02-14 NOTE — Patient Instructions (Addendum)
Good to see you! Your a1C was 11.2. This is better! We still have work to do.   1. Increase your insulin to Basaglar 22 units once a day. Go up by 1 unit each day until your fasting blood sugars are in the low 100s.  We will call you once the shipment arrives from the drug company for your free insulin.   Make an appointment to see Dr. Birdie SonsSonnenberg if the knee pain does not improve.

## 2018-02-15 ENCOUNTER — Telehealth: Payer: Self-pay

## 2018-02-15 NOTE — Telephone Encounter (Signed)
Received basaglar form patients assistance with [name].Called patient and notified her the basaglar is ready to be picked up.

## 2018-02-15 NOTE — Assessment & Plan Note (Signed)
#  ASCVD risk - secondary prevention in patient aged 63-75 with DM, baseline LDL 70-189, moderate intensity statin indicated. Triglycerides likely elevated due to uncontrolled diabetes.  - Continued Aspirin 81 mg  - Continued atorvastatin 40 mg.   #Hypertension longstanding currently controlled, with in office BP <130/80.  Patient reports adherence with medication. - Continue amlodipine 5 mg daily, hctz 25 mg daily, and lisinopril 40 mg daily. Can consider lowering doses at next visit.

## 2018-02-15 NOTE — Assessment & Plan Note (Signed)
#  Diabetes longstanding currently uncontrolled with A1c significantly improved however still too high as expected. Patient denies hypoglycemic events and is able to verbalize appropriate hypoglycemia management plan. Patient reports adherence with medication. Control is suboptimal due to dietary indiscretions, lact of exercise, suspected insulin resistance, and suboptimal therapy. -A1C today - Increased Basaglar to 22 units injected every morning. Patient to increase dose by 1 unit/day until fasting BG are in low 100s.  - Continue metformin 1000 mg BID

## 2018-02-17 ENCOUNTER — Telehealth: Payer: Self-pay

## 2018-02-17 NOTE — Telephone Encounter (Signed)
Please advise 

## 2018-02-17 NOTE — Telephone Encounter (Signed)
She can be put in a same day slot for this.

## 2018-02-17 NOTE — Telephone Encounter (Signed)
Copied from CRM [id number]. Topic: Appointment Scheduling - Scheduling Inquiry for Clinic >> Feb 17, 2018 11:25 AM Lorrine KinMcGee, Demi B, NT wrote: Reason for CRM: Patient calling and is requesting appointment for knee pain after a fall x 1.5 weeks ago. Informed her that he did not have anything available in the month of July that it would be August or later. Did not want to wait until then to be seen. Requests to only see Dr Birdie SonsSonnenberg since he is her doctor. CB#: [phone]

## 2018-02-17 NOTE — Progress Notes (Signed)
I have reviewed the above note and agree. I was available to the pharmacist for consultation.  Delshawn Stech, MD  

## 2018-02-17 NOTE — Telephone Encounter (Signed)
scheduled

## 2018-02-21 ENCOUNTER — Ambulatory Visit (INDEPENDENT_AMBULATORY_CARE_PROVIDER_SITE_OTHER): Payer: Medicare Other

## 2018-02-21 ENCOUNTER — Encounter: Payer: Self-pay | Admitting: Family Medicine

## 2018-02-21 ENCOUNTER — Ambulatory Visit (INDEPENDENT_AMBULATORY_CARE_PROVIDER_SITE_OTHER): Payer: Medicare Other | Admitting: Family Medicine

## 2018-02-21 VITALS — BP 90/62 | HR 96 | Temp 98.4°F | Ht 62.0 in | Wt 152.2 lb

## 2018-02-21 DIAGNOSIS — M79651 Pain in right thigh: Secondary | ICD-10-CM | POA: Diagnosis not present

## 2018-02-21 DIAGNOSIS — M25561 Pain in right knee: Secondary | ICD-10-CM

## 2018-02-21 DIAGNOSIS — S8991XA Unspecified injury of right lower leg, initial encounter: Secondary | ICD-10-CM | POA: Diagnosis not present

## 2018-02-21 DIAGNOSIS — S79921A Unspecified injury of right thigh, initial encounter: Secondary | ICD-10-CM | POA: Diagnosis not present

## 2018-02-21 DIAGNOSIS — M7989 Other specified soft tissue disorders: Secondary | ICD-10-CM | POA: Diagnosis not present

## 2018-02-21 NOTE — Progress Notes (Signed)
Tammy AlarEric Sonnenberg, MD Phone: [phone]  Oliver HumDeborah H Acosta is a 62 y.o. female who presents today for same day visit.   CC: right knee pain  She notes she fell while walking down the stairs 2.5 weeks ago.  The stairs were wet and she slipped with her left leg going out in front of her and her right leg bending up underneath her and bending at her knee.  She impacted her right knee.  She notes no head injury or loss of consciousness.  She felt the knee would get better though it has not.  Hurts more if she has been sitting still and then gets up to move.  Does radiate some up her thigh.  Tried Aleve with little benefit.  Social History   Tobacco Use  Smoking Status Current Some Day Smoker  . Packs/day: 0.25  . Years: 40.00  . Pack years: 10.00  . Types: Cigarettes  Smokeless Tobacco Never Used  Tobacco Comment   smokes when stressed "Not many"     ROS see history of present illness  Objective  Physical Exam Vitals:   02/21/18 1506  BP: 90/62  Pulse: 96  Temp: 98.4 F (36.9 C)  SpO2: 96%    BP Readings from Last 3 Encounters:  02/21/18 90/62  02/14/18 96/68  01/12/18 106/62   Wt Readings from Last 3 Encounters:  02/21/18 152 lb 3.2 oz (69 kg)  02/14/18 153 lb 9.6 oz (69.7 kg)  01/12/18 155 lb 12.8 oz (70.7 kg)    Physical Exam  Constitutional: No distress.  Cardiovascular: Normal rate, regular rhythm and normal heart sounds.  Pulmonary/Chest: Effort normal and breath sounds normal.  Musculoskeletal: She exhibits no edema.  Right knee with slight medial swelling, there is tenderness over the medial joint line, slight tenderness over the patella and medial bony structures, no erythema or warmth, no ligamentous laxity though testing is somewhat limited by discomfort, she does have pain on McMurray's though no palpable clicking  Neurological: She is alert.  Skin: Skin is warm and dry. She is not diaphoretic.     Assessment/Plan: Please see individual problem  list.  Acute pain of right knee Patient with injury to right knee.  I suspect a meniscal issue though we need to rule out fracture given her persistent discomfort.  X-rays will be obtained.  Once those return we will determine the best management.   Orders Placed This Encounter  Procedures  . DG Knee Complete 4 Views Right    Standing Status:   Future    Number of Occurrences:   1    Standing Expiration Date:   04/25/2019    Order Specific Question:   Reason for Exam (SYMPTOM  OR DIAGNOSIS REQUIRED)    Answer:   right knee pain medially, occasionally radiates up femur more recently, fall 2.5 weeks ago    Order Specific Question:   Preferred imaging location?    Answer:   [facility]    Order Specific Question:   Radiology Contrast Protocol - do NOT remove file path    Answer:   \\[email or web address]\Radiant\DXFluoroContrastProtocols.pdf  . DG FEMUR, MIN 2 VIEWS RIGHT    Standing Status:   Future    Number of Occurrences:   1    Standing Expiration Date:   04/25/2019    Order Specific Question:   Reason for Exam (SYMPTOM  OR DIAGNOSIS REQUIRED)    Answer:   right knee pain medially, occasionally radiates up femur more recently, fall 2.5  weeks ago    Order Specific Question:   Preferred imaging location?    Answer:   [facility] Specific Question:   Radiology Contrast Protocol - do NOT remove file path    Answer:   \\[email or web address]\Radiant\DXFluoroContrastProtocols.pdf    No orders of the defined types were placed in this encounter.    Tammy Alar, MD [facility]

## 2018-02-21 NOTE — Patient Instructions (Signed)
Nice to see you. We will get x-rays of your knee and right leg and then determine the next step in management. If your pain worsens please let us know.

## 2018-02-22 ENCOUNTER — Other Ambulatory Visit: Payer: Self-pay | Admitting: Family Medicine

## 2018-02-22 DIAGNOSIS — M25561 Pain in right knee: Secondary | ICD-10-CM | POA: Insufficient documentation

## 2018-02-22 MED ORDER — MELOXICAM 7.5 MG PO TABS: 7.5000 mg | ORAL_TABLET | Freq: Every day | ORAL | 0 refills | Status: DC | PRN

## 2018-02-22 NOTE — Assessment & Plan Note (Signed)
Patient with injury to right knee.  I suspect a meniscal issue though we need to rule out fracture given her persistent discomfort.  X-rays will be obtained.  Once those return we will determine the best management.

## 2018-02-23 NOTE — Telephone Encounter (Signed)
Last OV 02/21/18 last filled 02/22/18 30 0rf Patient is requesting 90 day supply

## 2018-02-25 ENCOUNTER — Telehealth: Payer: Self-pay | Admitting: *Deleted

## 2018-02-25 NOTE — Telephone Encounter (Signed)
Copied from CRM [id number]. Topic: General - Other >> Feb 25, 2018 12:25 PM Gaynelle AduPoole, Shalonda wrote: Reason for CRM: Patient is calling stated she is needing a copy of her x-ray for her referral. Please advise

## 2018-02-28 NOTE — Telephone Encounter (Signed)
Could you create a CD please

## 2018-03-01 NOTE — Telephone Encounter (Signed)
FYI

## 2018-03-01 NOTE — Telephone Encounter (Signed)
Patient notified cd is at front desk

## 2018-03-01 NOTE — Telephone Encounter (Signed)
Pt has an appt with orthopaedic on 03-03-18

## 2018-03-04 ENCOUNTER — Other Ambulatory Visit: Payer: Self-pay | Admitting: Family Medicine

## 2018-03-04 DIAGNOSIS — M7918 Myalgia, other site: Secondary | ICD-10-CM

## 2018-03-04 NOTE — Telephone Encounter (Signed)
Last OV 02/21/18 last filled Tizanidine 02/03/18 270 0 rf Hydrochlorothiazide 02/14/18 90 0rf

## 2018-03-06 NOTE — Telephone Encounter (Signed)
Sent to pharmacy. I reviewed her prior lab work again as well.  Patients sodium was slightly low on recent testing. I would like to recheck this to see if it is normal now. I would also like to recheck her lipase as it was minimally elevated previously and we did not find a cause for it on CT imaging. I would like to see if it returned to the normal range. Orders placed. Please get her set up for a lab appointment.

## 2018-03-07 NOTE — Telephone Encounter (Signed)
Patient notified and scheduled 

## 2018-03-08 ENCOUNTER — Other Ambulatory Visit (INDEPENDENT_AMBULATORY_CARE_PROVIDER_SITE_OTHER): Payer: Medicare Other

## 2018-03-08 DIAGNOSIS — E785 Hyperlipidemia, unspecified: Secondary | ICD-10-CM | POA: Diagnosis not present

## 2018-03-08 LAB — HEPATIC FUNCTION PANEL
ALT: 10 U/L (ref 0–35)
AST: 12 U/L (ref 0–37)
Albumin: 3.9 g/dL (ref 3.5–5.2)
Alkaline Phosphatase: 62 U/L (ref 39–117)
BILIRUBIN DIRECT: 0 mg/dL (ref 0.0–0.3)
BILIRUBIN TOTAL: 0.2 mg/dL (ref 0.2–1.2)
Total Protein: 7.2 g/dL (ref 6.0–8.3)

## 2018-03-08 LAB — LDL CHOLESTEROL, DIRECT: LDL DIRECT: 117 mg/dL

## 2018-03-14 ENCOUNTER — Telehealth: Payer: Self-pay

## 2018-03-14 NOTE — Telephone Encounter (Signed)
Please advise 

## 2018-03-14 NOTE — Telephone Encounter (Signed)
Patient states she received a letter from [employer], after already being approved for the insulin, stating that they need proof of out of pocket costs spent this year. What do we need to do for this?

## 2018-03-14 NOTE — Telephone Encounter (Signed)
I am unsure of the requirements for this.  We will need to discuss this with the clinical pharmacist when she is in the office next week.

## 2018-03-15 ENCOUNTER — Other Ambulatory Visit: Payer: Self-pay | Admitting: Family Medicine

## 2018-03-15 DIAGNOSIS — E785 Hyperlipidemia, unspecified: Secondary | ICD-10-CM

## 2018-03-15 MED ORDER — ATORVASTATIN CALCIUM 80 MG PO TABS: 80.0000 mg | ORAL_TABLET | Freq: Every day | ORAL | 1 refills | Status: DC

## 2018-03-15 NOTE — Telephone Encounter (Signed)
Contacted [facility] patient assistance. They confirmed that since patient is enrolled in the program, they do not need proof of out of pocket spend.   I will contact Ms. Jim and let her know.    Catie Feliz Beamravis, PharmD PGY2 Ambulatory Care Pharmacy Resident Phone: [phone]

## 2018-03-15 NOTE — Telephone Encounter (Signed)
noted 

## 2018-04-03 ENCOUNTER — Other Ambulatory Visit: Payer: Self-pay | Admitting: Family Medicine

## 2018-04-18 ENCOUNTER — Ambulatory Visit: Payer: Medicare Other | Admitting: Family Medicine

## 2018-04-21 ENCOUNTER — Other Ambulatory Visit: Payer: Self-pay | Admitting: Family Medicine

## 2018-04-21 MED ORDER — BASAGLAR KWIKPEN 100 UNIT/ML ~~LOC~~ SOPN: 22.0000 [IU] | PEN_INJECTOR | Freq: Every day | SUBCUTANEOUS | 0 refills | Status: DC

## 2018-04-21 NOTE — Telephone Encounter (Signed)
Copied from CRM [id number]. Topic: Quick Communication - Rx Refill/Question >> Apr 21, 2018  9:07 AM Angela NevinWilliams, Candice N wrote: Medication: Insulin Glargine [facility](BASAGLAR KWIKPEN) 100 UNIT/ML SOPN [[id number]][[id number]]   Pt states that she has one pen left and would like for this to be refilled. Please advise.

## 2018-04-26 NOTE — Telephone Encounter (Signed)
Patient states that she is to get this medication through [facility] and that the medication is to be sent to the office but I do not see any documentation for this. She states that she is almost out. Please advise

## 2018-04-26 NOTE — Telephone Encounter (Signed)
Per Devota Pace' note on 6/21/219, Patient approved for Basaglar via [name] [facility] effective 02/09/2018 through 08/23/2018. Prescription was submitted for 4 month supply with 2 refills. She should contact [employer] for refills - [phone].  Catie Feliz Beam, PharmD PGY2 Ambulatory Care Pharmacy Resident Phone: [phone]

## 2018-04-26 NOTE — Telephone Encounter (Signed)
Patient is calling back to check on the status of her medication refill.  She has not heard anything. Please advise.

## 2018-04-27 NOTE — Telephone Encounter (Signed)
Received call from Patient Engagement Center with patient on the line. Patient noted that she received letter from [name] that the provider's office needed to request refills.   Contacted [name]. Prescription on file with [name] reflected a previous medication sig which had since been updated by Devota Pace, PharmD on 02/14/2018 with instructions to titrate as instructed. Thus, patient is due for a refill before [name] expects her to be.   Contacted patient, determined what dose of Basaglar she had titrated to per Devota Pace' instruction. Filled out [name] refill form, faxed to office for Dr. Purvis Sheffield signature; Henrene Pastor, LPN to fax to [facility]. Olegario Messier also setting aside Basaglar samples for patient, as patient states she will run out of medication before the weekend.   Plan - Will f/u with [name] next week regarding refill request paperwork, projected shipping times.  Catie Feliz Beam, PharmD PGY2 Ambulatory Care Pharmacy Resident Phone: [phone]

## 2018-04-28 NOTE — Telephone Encounter (Signed)
Forms were signed.  I will forward to Olegario Messier to make sure she set aside basiglar samples for the patient.

## 2018-04-28 NOTE — Telephone Encounter (Signed)
Forms faxed as requested

## 2018-04-29 NOTE — Telephone Encounter (Signed)
Set aside basaglar samples and faxed from as requested.

## 2018-05-03 ENCOUNTER — Other Ambulatory Visit: Payer: Self-pay | Admitting: Family Medicine

## 2018-05-09 NOTE — Telephone Encounter (Signed)
Pt states she is on her last sample and wants to know if you have heard from [facility] yet?

## 2018-05-09 NOTE — Telephone Encounter (Signed)
By any chance do you know if we got back any information for patients basaglar

## 2018-05-10 NOTE — Telephone Encounter (Signed)
Called and spoke with pt. Pt advised and voiced understanding will pick up sample tomorrow. Sample has been placed in the refrigerator for pick up with pt's name labled on the box.

## 2018-05-10 NOTE — Telephone Encounter (Signed)
Contacted [facility] - they processed the refill paperwork this morning. Asked to expedite shipping, they will do so, but will still be ~7 business days.   I believe we have one more sample pen of Basaglar in the fridge, with that + the remaining pen she has, she should be covered. Could we prepare that last Basaglar pen for Tammy Acosta?  Thanks! Catie Feliz Beamravis, PharmD PGY2 Ambulatory Care Pharmacy Resident Phone: [phone]

## 2018-05-10 NOTE — Telephone Encounter (Signed)
Have you heard anything on this patient Basaglar?

## 2018-05-17 NOTE — Telephone Encounter (Signed)
Since Dr. Birdie SonsSonnenberg was out and I could not get a verbal OK to update the prescription in the chart, I did not update it to reflect her current usage.    Dr. Birdie SonsSonnenberg, could you please update the prescription to state Basaglar 44 units daily?   Thanks!  Catie Feliz Beamravis, PharmD PGY2 Ambulatory Care Pharmacy Resident Phone: [phone]

## 2018-05-17 NOTE — Telephone Encounter (Signed)
Patient is in clinic to pick up Basaglar she says she is on 44 units of basaglar, chart reads 22 units she states you advised her to take 44 units please advise patient if we labeled  Medication 22 units.

## 2018-05-18 MED ORDER — BASAGLAR KWIKPEN 100 UNIT/ML ~~LOC~~ SOPN: 44.0000 [IU] | PEN_INJECTOR | Freq: Every day | SUBCUTANEOUS | 0 refills | Status: DC

## 2018-05-18 NOTE — Addendum Note (Signed)
Addended by: Glori Luis on: 05/18/2018 05:29 PM   Modules accepted: Orders

## 2018-05-18 NOTE — Telephone Encounter (Signed)
Updated to reflect 44 units daily.

## 2018-05-19 NOTE — Telephone Encounter (Signed)
Called notified patient that her Basaglar insulin is in the office and that the dosage has been updated in her chart to reflect the new dosage.

## 2018-06-08 ENCOUNTER — Other Ambulatory Visit: Payer: Self-pay | Admitting: Family Medicine

## 2018-07-01 ENCOUNTER — Other Ambulatory Visit: Payer: Self-pay | Admitting: Family Medicine

## 2018-07-01 DIAGNOSIS — B0222 Postherpetic trigeminal neuralgia: Secondary | ICD-10-CM

## 2018-07-01 DIAGNOSIS — M792 Neuralgia and neuritis, unspecified: Secondary | ICD-10-CM

## 2018-07-12 ENCOUNTER — Other Ambulatory Visit: Payer: Self-pay | Admitting: Family Medicine

## 2018-07-12 DIAGNOSIS — M7918 Myalgia, other site: Secondary | ICD-10-CM

## 2018-07-14 ENCOUNTER — Encounter: Payer: Self-pay | Admitting: Pharmacy Technician

## 2018-07-14 ENCOUNTER — Other Ambulatory Visit: Payer: Self-pay | Admitting: Pharmacy Technician

## 2018-07-14 NOTE — Patient Outreach (Signed)
[facility] Care Management  07/14/2018  Tammy HumDeborah H Acosta [birth year] [id number]   Incoming call received from patient who was returning my call, HIPAA identifiers obtained.    Spoke to [name] to inquire if she was interested in pursuing patient assistance in 2020 for Basaglar through [employer].  Patient agreed to participate and was informed that an application would be mailed. Information was also provided concerning documents needed. Patient verbalized understanding.  Prepared patient portion to be mailed.  Provider portion to be given to [facility] Catie Feliz Beamravis to take with her to clinic.  Will followup with patient in 7-10 business days to confirm receipt of application.  Karmyn Lowman P. Paulette Lynch, CPhT [facility] Care Management [phone]

## 2018-07-14 NOTE — Patient Outreach (Signed)
[facility] Care Management  07/14/2018  Konrad DoloresDeborah H Acosta 04-07-1956 [id number]    Unsuccessful call in reference to 2020 patient assistance application for Basaglar through [employer].  HIPAA compliant voicemail left for patient to return call.  Lagina Reader P. Emmanuelle Coxe, CPhT [facility] Care Management [phone]

## 2018-07-15 ENCOUNTER — Ambulatory Visit (INDEPENDENT_AMBULATORY_CARE_PROVIDER_SITE_OTHER): Payer: Medicare Other | Admitting: Family Medicine

## 2018-07-15 ENCOUNTER — Encounter: Payer: Self-pay | Admitting: Family Medicine

## 2018-07-15 VITALS — BP 120/72 | HR 78 | Temp 98.4°F | Ht 62.0 in | Wt 164.8 lb

## 2018-07-15 DIAGNOSIS — M792 Neuralgia and neuritis, unspecified: Secondary | ICD-10-CM

## 2018-07-15 DIAGNOSIS — E785 Hyperlipidemia, unspecified: Secondary | ICD-10-CM

## 2018-07-15 DIAGNOSIS — I10 Essential (primary) hypertension: Secondary | ICD-10-CM

## 2018-07-15 DIAGNOSIS — G8929 Other chronic pain: Secondary | ICD-10-CM

## 2018-07-15 DIAGNOSIS — Z720 Tobacco use: Secondary | ICD-10-CM | POA: Diagnosis not present

## 2018-07-15 DIAGNOSIS — E1142 Type 2 diabetes mellitus with diabetic polyneuropathy: Secondary | ICD-10-CM | POA: Diagnosis not present

## 2018-07-15 DIAGNOSIS — B0222 Postherpetic trigeminal neuralgia: Secondary | ICD-10-CM

## 2018-07-15 DIAGNOSIS — M545 Low back pain, unspecified: Secondary | ICD-10-CM

## 2018-07-15 DIAGNOSIS — Z8669 Personal history of other diseases of the nervous system and sense organs: Secondary | ICD-10-CM

## 2018-07-15 DIAGNOSIS — Z23 Encounter for immunization: Secondary | ICD-10-CM | POA: Diagnosis not present

## 2018-07-15 DIAGNOSIS — Z800 Family history of malignant neoplasm of digestive organs: Secondary | ICD-10-CM

## 2018-07-15 DIAGNOSIS — Z1159 Encounter for screening for other viral diseases: Secondary | ICD-10-CM | POA: Diagnosis not present

## 2018-07-15 LAB — COMPREHENSIVE METABOLIC PANEL
ALBUMIN: 4.2 g/dL (ref 3.5–5.2)
ALT: 11 U/L (ref 0–35)
AST: 16 U/L (ref 0–37)
Alkaline Phosphatase: 61 U/L (ref 39–117)
BUN: 23 mg/dL (ref 6–23)
CO2: 28 meq/L (ref 19–32)
CREATININE: 1.18 mg/dL (ref 0.40–1.20)
Calcium: 9.7 mg/dL (ref 8.4–10.5)
Chloride: 102 mEq/L (ref 96–112)
GFR: 49.11 mL/min — ABNORMAL LOW (ref >60.00)
GLUCOSE: 130 mg/dL — AB (ref 70–99)
Potassium: 4.6 mEq/L (ref 3.5–5.1)
SODIUM: 141 meq/L (ref 135–145)
Total Bilirubin: 0.3 mg/dL (ref 0.2–1.2)
Total Protein: 7.6 g/dL (ref 6.0–8.3)

## 2018-07-15 LAB — LIPID PANEL
CHOL/HDL RATIO: 6
CHOLESTEROL: 167 mg/dL (ref 0–200)
HDL: 27.4 mg/dL — ABNORMAL LOW (ref >39.00)
NONHDL: 139.23
Triglycerides: 320 mg/dL — ABNORMAL HIGH (ref 0.0–149.0)
VLDL: 64 mg/dL — ABNORMAL HIGH (ref 0.0–40.0)

## 2018-07-15 LAB — LDL CHOLESTEROL, DIRECT: LDL DIRECT: 85 mg/dL

## 2018-07-15 LAB — HEMOGLOBIN A1C: HEMOGLOBIN A1C: 7.3 % — AB (ref 4.6–6.5)

## 2018-07-15 MED ORDER — GABAPENTIN 800 MG PO TABS: 800.0000 mg | ORAL_TABLET | Freq: Four times a day (QID) | ORAL | 1 refills | Status: DC

## 2018-07-15 NOTE — Assessment & Plan Note (Signed)
Seems to be very well controlled.  Continue current medication.  Check A1c.

## 2018-07-15 NOTE — Assessment & Plan Note (Signed)
Status post gamma knife.  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  We will refill gabapentin at 4 times daily as that was beneficial previously.

## 2018-07-15 NOTE — Assessment & Plan Note (Signed)
Chronic issue.  Stable.  Continue current regimen.

## 2018-07-15 NOTE — Assessment & Plan Note (Signed)
Patient is not ready to quit smoking.  Discussed nicotine replacement would be the option for her.  Given her seizure history Wellbutrin is not an option.  Given her history of anxiety and depression Chantix would not be a great option.

## 2018-07-15 NOTE — Progress Notes (Signed)
Tommi Rumps, MD Phone: [phone]  Tammy Acosta is a 62 y.o. female who presents today for f/u.  CC: trigeminal neuralgia, htn, dm, chronic back pain  Trigeminal neuralgia: Patient has a history of this.  She had a gamma knife procedure which took care of the discomfort though left her with numbness in V1, V2, and V3 as well as eye irritation.  She does follow with ophthalmology for that.  She takes the gabapentin and it does help with her symptoms in her face.  Hypertension: Not checking blood pressures.  Taking amlodipine, HCTZ, lisinopril.  No chest pain, shortness of breath, or edema.  Next  Diabetes: 111 fasting.  138 in the PM.  Taking basiglar 44 units daily.  Does not take insulin if it is less than 100 when she checks her sugar.  Taking metformin.  No polyuria or polydipsia.  No hypoglycemia.  Chronic back pain: Takes Aleve and gabapentin for this.  The weather sets this off.  Notes it is stable.  Does have some discomfort now.  No saddle anesthesia or bowel or bladder incontinence.  Patient reports she is status post hysterectomy.  This was done at age 23.  She had a number of abnormal Pap smears prior to the hysterectomy and they recommended removal of her uterus cervix and uterus.  She notes they were precancerous.  She notes her last Pap smear was several years prior to coming here.  She notes no abnormal Pap smears after her hysterectomy.  Patient reports her father has a colon cancer history.  She thinks her last colonoscopy was less than 5 years ago.  Social History   Tobacco Use  Smoking Status Current Some Day Smoker  . Packs/day: 0.25  . Years: 40.00  . Pack years: 10.00  . Types: Cigarettes  Smokeless Tobacco Never Used  Tobacco Comment   smokes when stressed "Not many"     ROS see history of present illness  Objective  Physical Exam Vitals:   07/15/18 0902  BP: 120/72  Pulse: 78  Temp: 98.4 F (36.9 C)  SpO2: 97%    BP Readings from  Last 3 Encounters:  07/15/18 120/72  02/21/18 90/62  02/14/18 96/68   Wt Readings from Last 3 Encounters:  07/15/18 164 lb 12.8 oz (74.8 kg)  02/21/18 152 lb 3.2 oz (69 kg)  02/14/18 153 lb 9.6 oz (69.7 kg)    Physical Exam  Constitutional: No distress.  Cardiovascular: Normal rate, regular rhythm and normal heart sounds.  Pulmonary/Chest: Effort normal and breath sounds normal.  Musculoskeletal: She exhibits no edema.  No midline spine tenderness, no midline spine step-off, no muscular back tenderness  Neurological: She is alert.  5/5 strength bilateral quads, hamstrings, plantar flexion, and dorsiflexion, sensation light touch intact bilateral lower extremities, decreased sensation V1, V2, and V3 on the left side, intact on the right  Skin: Skin is warm and dry. She is not diaphoretic.     Assessment/Plan: Please see individual problem list.  History of trigeminal neuralgia Status post gamma knife.  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  We will refill gabapentin at 4 times daily as that was beneficial previously.  Essential (primary) hypertension Well-controlled.  Continue current regimen.  Type 2 diabetes mellitus (HCC) Seems to be very well controlled.  Continue current medication.  Check A1c.  Chronic low back pain (Location of Primary Source of Pain) (Bilateral) (L>R) Chronic issue.  Stable.  Continue current regimen.  Tobacco abuse  Patient is not ready to quit smoking.  Discussed nicotine replacement would be the option for her.  Given her seizure history Wellbutrin is not an option.  Given her history of anxiety and depression Chantix would not be a great option.   Health Maintenance: Refer to GI for colonoscopy.  Discussed pelvic exam and Pap smear though patient has deferred this and will consider doing this.  She may not meet criteria for having this done given that it has been greater than 20 years since her hysterectomy.  She reports no  additional abnormal Pap smears.  She will consider whether or not she wants to do a pelvic exam with her next visit.  Orders Placed This Encounter  Procedures  . HgB A1c  . Comp Met (CMET)  . Hepatitis C Antibody  . Lipid panel  . Ambulatory referral to Gastroenterology    Referral Priority:   Routine    Referral Type:   Consultation    Referral Reason:   Specialty Services Required    Number of Visits Requested:   1    Meds ordered this encounter  Medications  . gabapentin (NEURONTIN) 800 MG tablet    Sig: Take 1 tablet (800 mg total) by mouth 4 (four) times daily.    Dispense:  360 tablet    Refill:  [place], MD [place]

## 2018-07-15 NOTE — Addendum Note (Signed)
Addended by: Gracelyn NurseBLACKWELL, Dahlton Hinde P on: 07/15/2018 10:24 AM   Modules accepted: Orders

## 2018-07-15 NOTE — Assessment & Plan Note (Signed)
Well-controlled.  Continue current regimen. 

## 2018-07-15 NOTE — Patient Instructions (Signed)
Nice to see you. I have refilled your gabapentin. We will check lab work today and contact you with the results. We will get you to see GI for possible colonoscopy. Please consider whether or not you want to do a pelvic exam with Pap smear at your next visit.

## 2018-07-16 LAB — HEPATITIS C ANTIBODY
Hepatitis C Ab: NONREACTIVE
SIGNAL TO CUT-OFF: 0.02 (ref <1.00)

## 2018-07-18 ENCOUNTER — Other Ambulatory Visit: Payer: Self-pay | Admitting: Family Medicine

## 2018-07-18 MED ORDER — ICOSAPENT ETHYL 1 G PO CAPS: 2.0000 g | ORAL_CAPSULE | Freq: Two times a day (BID) | ORAL | 3 refills | Status: DC

## 2018-07-25 ENCOUNTER — Other Ambulatory Visit: Payer: Self-pay | Admitting: Pharmacy Technician

## 2018-07-25 NOTE — Patient Outreach (Signed)
[facility]) Care Management  07/25/2018  Oliver HumDeborah H Acosta [birth year] [id number]  Incoming call received from patient, HIPAA identifiers verified.   Patient called to inform that she had received her 2020 lilly application for [employer]. She inquired if we had a copy of her insurance card and proof of income from earlier this year. Informed patient that we would use those documents to [facility]. Patient verbalized understanding.   Patient states she will place in the mail this week.  Will followup with patient in 7-10 business days if application has not been received back.  Nikaya Nasby P. Arrian Manson, CPhT [facility] Network Care Management [phone]

## 2018-08-02 ENCOUNTER — Other Ambulatory Visit: Payer: Self-pay | Admitting: Pharmacy Technician

## 2018-08-02 NOTE — Patient Outreach (Signed)
[facility](THN) Care Management  08/02/2018  Tammy Acosta 1956/03/16 [id number]    Received all necessary documents and signatures from both patient and provider for 2020 patient assistance for Basaglar through [employer].   Submitted completed application via fax to [employer].  Will followup mid January to see if a determination has been made as [employer] will not begin processing 2020 applications until 08/25/2018.  [name] P. Audray Rumore, CPhT [facility]icianTri[facility] HealthCare Network Care Management [phone]

## 2018-08-10 ENCOUNTER — Other Ambulatory Visit: Payer: Self-pay | Admitting: Family Medicine

## 2018-08-10 DIAGNOSIS — M7918 Myalgia, other site: Secondary | ICD-10-CM

## 2018-08-11 ENCOUNTER — Other Ambulatory Visit: Payer: Self-pay

## 2018-08-11 MED ORDER — METFORMIN HCL 1000 MG PO TABS: ORAL_TABLET | ORAL | 2 refills | Status: DC

## 2018-08-26 ENCOUNTER — Other Ambulatory Visit: Payer: Self-pay | Admitting: Pharmacy Technician

## 2018-08-26 NOTE — Patient Outreach (Signed)
[facility]) Care Management  08/26/2018  Tammy Acosta May 18, 1956 [id number]   Care coordination call placed to [facility] in regards to patient's medication assistance application for Basaglar.  Spoke to Rafael Gonzalez who said the application was still currently in the pending phase for 2020 and she would have it sent over to processing. She suggested calling back in about 5-7 business days to inquire about an updated.  Will followup in 7-10 business days with company to see if a determination has been made.  Malini Flemings P. Orvile Corona, CPhT Musician Care Management [phone]

## 2018-09-01 ENCOUNTER — Telehealth: Payer: Self-pay

## 2018-09-01 ENCOUNTER — Other Ambulatory Visit: Payer: Self-pay | Admitting: Pharmacy Technician

## 2018-09-01 NOTE — Patient Outreach (Signed)
[facility]) Care Management  09/01/2018  Lasheka Bogdon Smoot [birth year] [id number]  Care coordination call placed to [name] cares in reference to [facility] patient assistance application.  Spoke to [place] who said patient was APPROVED from 08/24/2018-08/24/2019. Artis Buechele Alexanders informed that the shipment was delivered today 09/01/2018 at 238pm. The tracking number is [phone] by UPS. Patient is to be receiving 20pens or  4 boxes which is a 4 month supply.  Will followup with patient in 3-5 business days to confirm she has picked up medication from provider's office.  Angeligue Bowne P. Gottlieb Zuercher, CPhT Musician Care Management [phone]

## 2018-09-01 NOTE — Telephone Encounter (Signed)
ERROR 4 boxes total are ready for patient

## 2018-09-01 NOTE — Telephone Encounter (Signed)
Both samples are ready for pick up   Basaglar  1 box   Labeled and and ready for pick up.   Sent to PCP to review before we call patient to advise to pick up.   Thanks

## 2018-09-02 NOTE — Telephone Encounter (Signed)
Please pull this out of the refrigerator and handed to me for me to review on Monday.  They can then be made available for pickup.

## 2018-09-05 ENCOUNTER — Ambulatory Visit: Payer: Medicare Other | Admitting: Gastroenterology

## 2018-09-06 ENCOUNTER — Other Ambulatory Visit: Payer: Self-pay | Admitting: Family Medicine

## 2018-09-06 DIAGNOSIS — M7918 Myalgia, other site: Secondary | ICD-10-CM

## 2018-09-06 DIAGNOSIS — E785 Hyperlipidemia, unspecified: Secondary | ICD-10-CM

## 2018-09-06 MED ORDER — ATORVASTATIN CALCIUM 80 MG PO TABS: 80.0000 mg | ORAL_TABLET | Freq: Every day | ORAL | 1 refills | Status: DC

## 2018-09-06 MED ORDER — LISINOPRIL 40 MG PO TABS: 40.0000 mg | ORAL_TABLET | Freq: Every day | ORAL | 1 refills | Status: DC

## 2018-09-06 MED ORDER — SERTRALINE HCL 100 MG PO TABS: ORAL_TABLET | ORAL | 1 refills | Status: DC

## 2018-09-06 NOTE — Telephone Encounter (Signed)
Requested medication (s) are due for refill today: yes  Requested medication (s) are on the active medication list: yes  Last refill:  08/11/18 #90  Future visit scheduled: yes   Notes to clinic:  Unable to refill per protocol. Pt requesting a one month supply of Tizanidine    Requested Prescriptions  Pending Prescriptions Disp Refills   tiZANidine (ZANAFLEX) 4 MG tablet 90 tablet 0    Sig: Take 1 tablet (4 mg total) by mouth 3 (three) times daily.     Not Delegated - Cardiovascular:  Alpha-2 Agonists - tizanidine Failed - 09/06/2018 12:35 PM      Failed - This refill cannot be delegated      Passed - Valid encounter within last 6 months    Recent Outpatient Visits          1 month ago Essential (primary) hypertension   [facility] [place], Yehuda Mao, MD   6 months ago Acute pain of right knee   [facility] Glori Luis, MD   6 months ago Type 2 diabetes mellitus with diabetic polyneuropathy, without long-term current use of insulin [facility])   [facility] [place], [place], [place]   7 months ago Epigastric pain   [facility] [place], Yehuda Mao, MD   7 months ago Type 2 diabetes mellitus with diabetic polyneuropathy, without long-term current use of insulin [facility])   [facility] [place], [place], [facility]      Future Appointments            In 1 month O'Brien-Blaney, Vivianne Spence, LPN [facility] [place], [facility]   In 1 month Birdie Sons, Yehuda Mao, MD [facility] [place], [facility]         Signed Prescriptions Disp Refills   sertraline (ZOLOFT) 100 MG tablet 90 tablet 1    Sig: TAKE 1 TABLET(100 MG) BY MOUTH DAILY     Psychiatry:  Antidepressants - SSRI Passed - 09/06/2018 12:35 PM      Passed - Completed PHQ-2 or PHQ-9 in the last 360 days.      Passed - Valid encounter within last 6 months    Recent Outpatient Visits          1 month ago Essential (primary)  hypertension   [facility] [place], Yehuda Mao, MD   6 months ago Acute pain of right knee   [facility] Glori Luis, MD   6 months ago Type 2 diabetes mellitus with diabetic polyneuropathy, without long-term current use of insulin [facility])   [facility] [place], [place], [place]   7 months ago Epigastric pain   [facility] [place], Yehuda Mao, MD   7 months ago Type 2 diabetes mellitus with diabetic polyneuropathy, without long-term current use of insulin [facility])   [facility] [place], [place], [facility]      Future Appointments            In 1 month O'Brien-Blaney, Denisa L, LPN [facility] [place], [facility]   In 1 month Birdie Sons, Yehuda Mao, MD  [facility] [place], [facility]          lisinopril (PRINIVIL,ZESTRIL) 40 MG tablet 90 tablet 1    Sig: Take 1 tablet (40 mg total) by mouth daily.     Cardiovascular:  ACE Inhibitors Passed - 09/06/2018 12:35 PM      Passed - Cr in normal range  and within 180 days    Creatinine  Date Value Ref Range Status  01/09/2013 0.94 0.60 - 1.30 mg/dL Final   Creatinine, Ser  Date Value Ref Range Status  07/15/2018 1.18 0.40 - 1.20 mg/dL Final         Passed - K in normal range and within 180 days    Potassium  Date Value Ref Range Status  07/15/2018 4.6 3.5 - 5.1 mEq/L Final  01/09/2013 4.1 3.5 - 5.1 mmol/L Final         Passed - Patient is not pregnant      Passed - Last BP in normal range    BP Readings from Last 1 Encounters:  07/15/18 120/72         Passed - Valid encounter within last 6 months    Recent Outpatient Visits          1 month ago Essential (primary) hypertension   [facility] [place], Yehuda MaoEric G, MD   6 months ago Acute pain of right knee   [facility] [place] Glori LuisSonnenberg, Eric G, MD   6 months ago Type 2 diabetes mellitus with diabetic polyneuropathy, without long-term current  use of insulin [facility](HCC)   [facility] [place], [place], [place]   7 months ago Epigastric pain   [facility] [place], Yehuda MaoEric G, MD   7 months ago Type 2 diabetes mellitus with diabetic polyneuropathy, without long-term current use of insulin [facility](HCC)   [facility] [place], [place], [facility]      Future Appointments            In 1 month O'Brien-Blaney, Denisa L, LPN [facility] [place], [facility]   In 1 month Birdie SonsSonnenberg, Yehuda MaoEric G, MD [facility] [place], [facility]          atorvastatin (LIPITOR) 80 MG tablet 90 tablet 1    Sig: Take 1 tablet (80 mg total) by mouth daily.     Cardiovascular:  Antilipid - Statins Failed - 09/06/2018 12:35 PM      Failed - LDL in normal range and within 360 days    No results found for: LDLCALC, LDLC, HIRISKLDL       Failed - HDL in normal range and within 360 days    HDL  Date Value Ref Range Status  07/15/2018 27.40 (L) >39.00 mg/dL Final         Failed - Triglycerides in normal range and within 360 days    Triglycerides  Date Value Ref Range Status  07/15/2018 320.0 (H) 0.0 - 149.0 mg/dL Final    Comment:    Normal:  <150 mg/dLBorderline High:  150 - 199 mg/dL         Passed - Total Cholesterol in normal range and within 360 days    Cholesterol  Date Value Ref Range Status  07/15/2018 167 0 - 200 mg/dL Final    Comment:    ATP III Classification       Desirable:  < 200 mg/dL               Borderline High:  200 - 239 mg/dL          High:  > = 161240 mg/dL         Passed - Patient is not pregnant      Passed - Valid encounter within last 12 months    Recent Outpatient Visits  1 month ago Essential (primary) hypertension   [facility] [place], Yehuda Mao, MD   6 months ago Acute pain of right knee   [facility] Glori Luis, MD   6 months ago Type 2 diabetes mellitus with diabetic polyneuropathy, without long-term current  use of insulin [facility])   [facility] [place], [place]   7 months ago Epigastric pain   [facility] [place], Yehuda Mao, MD   7 months ago Type 2 diabetes mellitus with diabetic polyneuropathy, without long-term current use of insulin [facility])   [facility]  [place], [place], [facility]      Future Appointments            In 1 month O'Brien-Blaney, Vivianne Spence, LPN [facility] [place], [facility]   In 1 month Birdie Sons, Yehuda Mao, MD [facility], [facility]

## 2018-09-06 NOTE — Telephone Encounter (Signed)
Copied from CRM [id number]. Topic: Quick Communication - Rx Refill/Question >> Sep 06, 2018 12:07 PM Mickel Baas B, NT wrote: Medication: sertraline (ZOLOFT) 100 MG tablet tiZANidine (ZANAFLEX) 4 MG tablet (requesting only 1 month supply) atorvastatin (LIPITOR) 80 MG tablet lisinopril (PRINIVIL,ZESTRIL) 40 MG tablet  Has the patient contacted their pharmacy? Yes. Changed pharmacies and does not have refills  (Agent: If no, request that the patient contact the pharmacy for the refill.) (Agent: If yes, when and what did the pharmacy advise?)  Preferred Pharmacy (with phone number or street name): [id number] - [place], [place] - [address]  Agent: Please be advised that RX refills may take up to 3 business days. We ask that you follow-up with your pharmacy.

## 2018-09-07 ENCOUNTER — Telehealth: Payer: Self-pay | Admitting: Family Medicine

## 2018-09-07 DIAGNOSIS — M7918 Myalgia, other site: Secondary | ICD-10-CM

## 2018-09-07 MED ORDER — TIZANIDINE HCL 4 MG PO TABS: 4.0000 mg | ORAL_TABLET | Freq: Three times a day (TID) | ORAL | 0 refills | Status: DC

## 2018-09-07 NOTE — Telephone Encounter (Signed)
Pt's last OV 07/15/2018.   Last refilled 08/11/2018 disp 90 with no refills   Sent to PCP to advise.

## 2018-09-07 NOTE — Addendum Note (Signed)
Addended by: Birdie Sons, Yehonatan Grandison G on: 09/07/2018 06:30 PM   Modules accepted: Orders

## 2018-09-07 NOTE — Telephone Encounter (Signed)
Medication not delegated for NT to refill. 

## 2018-09-07 NOTE — Telephone Encounter (Signed)
Copied from CRM [id number]. Topic: Quick Communication - Rx Refill/Question >> Sep 07, 2018  2:23 PM Jilda Roche wrote: Medication: tiZANidine (ZANAFLEX) 4 MG tablet  Has the patient contacted their pharmacy? Yes.   (Agent: If no, request that the patient contact the pharmacy for the refill.) (Agent: If yes, when and what did the pharmacy advise?) Needs refill  Preferred Pharmacy (with phone number or street name): [facility] [address], [place] - [address] [phone] (Phone) [phone] (Fax)    Agent: Please be advised that RX refills may take up to 3 business days. We ask that you follow-up with your pharmacy.

## 2018-09-07 NOTE — Telephone Encounter (Signed)
Sent to pharmacy 

## 2018-09-08 ENCOUNTER — Other Ambulatory Visit: Payer: Self-pay | Admitting: Pharmacy Technician

## 2018-09-08 NOTE — Telephone Encounter (Signed)
Called patient and left a detailed VM that Rx has been refilled. Advised patient to call back if needed.

## 2018-09-08 NOTE — Patient Outreach (Signed)
[facility]) Care Management  09/08/2018  Tammy Acosta 1955-12-14 [id number]  Unsuccessful outreach call placed to patient in regards to her Basaglar patient assistance application through [employer].  Unfortunately patient did not answer the phone, HIPAA compliant voicemail left.  Called to inquire if patient had picked up her Basaglar from the provider's office.  Will followup with outreach #2 in 3-5 business days if call is not returned.  Senie Lanese P. Riddik Senna, CPhT Musician Care Management [phone]

## 2018-09-08 NOTE — Patient Outreach (Signed)
[facility]) Care Management  09/08/2018  AMYRACLE SCELSI [birth year] [id number]   Incoming call received from patient in regards to [facility] patient assistance application through [employer].  Patient was returning my call, HIPAA identifiers verified. Informed patient that 4 boxes of Basglar had been delivered to the provider's office and was waiting on her to pickup. Discussed with patient how to obtain her refills. Patient verbalized understanding and had no further concerns or questions at this time.  Will remove myself from care team as patient assistance is complete.  Issabela Lesko P. Cherlyn Syring, CPhT Musician Care Management [phone]

## 2018-09-12 NOTE — Telephone Encounter (Signed)
Reviewed.  Please contact the patient to pick these up.

## 2018-09-12 NOTE — Telephone Encounter (Signed)
Let me know when your ready to look at this today.

## 2018-09-13 NOTE — Telephone Encounter (Signed)
Pt advised and voiced understanding. She stated that she will come pick it up tomorrow.

## 2018-09-19 NOTE — Telephone Encounter (Signed)
Contacted patient to remind her to pick up patient assistance Basaglar. She verbalized that she would come by the office to pick it up today.   Catie Feliz Beamravis, PharmD PGY2 Ambulatory Care Pharmacy Resident, [facility] Phone: [phone]

## 2018-10-11 ENCOUNTER — Other Ambulatory Visit: Payer: Self-pay | Admitting: Family Medicine

## 2018-10-11 DIAGNOSIS — M7918 Myalgia, other site: Secondary | ICD-10-CM

## 2018-10-11 NOTE — Telephone Encounter (Signed)
Last OV 07/15/2018   Last refilled 09/07/2018 disp 90 with no refills   Sent to PCP for approval

## 2018-10-17 ENCOUNTER — Other Ambulatory Visit: Payer: Self-pay | Admitting: Family Medicine

## 2018-10-17 MED ORDER — AMLODIPINE BESYLATE 5 MG PO TABS: 5.0000 mg | ORAL_TABLET | Freq: Every day | ORAL | 0 refills | Status: DC

## 2018-10-17 NOTE — Telephone Encounter (Signed)
[facility] [address], [place] - [address] [phone] (Phone) [phone] (Fax)

## 2018-10-17 NOTE — Telephone Encounter (Signed)
Copied from CRM [id number]. Topic: Quick Communication - See Telephone Encounter >> Oct 17, 2018  1:28 PM Trula Slade wrote: CRM for notification. See Telephone encounter for: 10/17/18. Patient would like a refill on her amLODipine (NORVASC) 5 MG tablet medication and have it sent to her preferred pharmacy

## 2018-10-17 NOTE — Telephone Encounter (Signed)
Requested Prescriptions  Pending Prescriptions Disp Refills  . amLODipine (NORVASC) 5 MG tablet 90 tablet 0    Sig: Take 1 tablet (5 mg total) by mouth daily.     Cardiovascular:  Calcium Channel Blockers Passed - 10/17/2018  1:50 PM      Passed - Last BP in normal range    BP Readings from Last 1 Encounters:  07/15/18 120/72         Passed - Valid encounter within last 6 months    Recent Outpatient Visits          3 months ago Essential (primary) hypertension   [facility] [place], Yehuda Mao, MD   7 months ago Acute pain of right knee   [facility] Glori Luis, MD   8 months ago Type 2 diabetes mellitus with diabetic polyneuropathy, without long-term current use of insulin [facility])   [facility] [place] Katz, [place]   9 months ago Epigastric pain   [facility]  [place], Yehuda Mao, MD   9 months ago Type 2 diabetes mellitus with diabetic polyneuropathy, without long-term current use of insulin [facility])   [facility]  [place], [place], [facility]      Future Appointments            In 2 weeks O'Brien-Blaney, Vivianne Spence, LPN [facility] [place], [facility]   In 2 weeks Birdie Sons, Yehuda Mao, MD [facility], [facility]

## 2018-10-24 ENCOUNTER — Ambulatory Visit: Payer: Medicare Other | Admitting: Family Medicine

## 2018-11-02 ENCOUNTER — Ambulatory Visit (INDEPENDENT_AMBULATORY_CARE_PROVIDER_SITE_OTHER): Payer: Medicare Other | Admitting: Family Medicine

## 2018-11-02 ENCOUNTER — Ambulatory Visit (INDEPENDENT_AMBULATORY_CARE_PROVIDER_SITE_OTHER): Payer: Medicare Other

## 2018-11-02 ENCOUNTER — Encounter: Payer: Self-pay | Admitting: Family Medicine

## 2018-11-02 ENCOUNTER — Other Ambulatory Visit: Payer: Self-pay

## 2018-11-02 VITALS — BP 112/80 | HR 93 | Temp 97.9°F | Resp 15 | Ht 62.0 in | Wt 162.0 lb

## 2018-11-02 VITALS — BP 112/80 | HR 74 | Temp 97.9°F | Ht 62.0 in | Wt 162.0 lb

## 2018-11-02 DIAGNOSIS — Z0000 Encounter for general adult medical examination without abnormal findings: Secondary | ICD-10-CM

## 2018-11-02 DIAGNOSIS — K047 Periapical abscess without sinus: Secondary | ICD-10-CM | POA: Insufficient documentation

## 2018-11-02 DIAGNOSIS — R569 Unspecified convulsions: Secondary | ICD-10-CM

## 2018-11-02 DIAGNOSIS — E785 Hyperlipidemia, unspecified: Secondary | ICD-10-CM

## 2018-11-02 DIAGNOSIS — I10 Essential (primary) hypertension: Secondary | ICD-10-CM

## 2018-11-02 DIAGNOSIS — E1142 Type 2 diabetes mellitus with diabetic polyneuropathy: Secondary | ICD-10-CM

## 2018-11-02 DIAGNOSIS — M25512 Pain in left shoulder: Secondary | ICD-10-CM

## 2018-11-02 DIAGNOSIS — Z800 Family history of malignant neoplasm of digestive organs: Secondary | ICD-10-CM | POA: Diagnosis not present

## 2018-11-02 LAB — BASIC METABOLIC PANEL
BUN: 31 mg/dL — ABNORMAL HIGH (ref 6–23)
CALCIUM: 9.8 mg/dL (ref 8.4–10.5)
CO2: 25 mEq/L (ref 19–32)
CREATININE: 1.27 mg/dL — AB (ref 0.40–1.20)
Chloride: 103 mEq/L (ref 96–112)
GFR: 42.41 mL/min — ABNORMAL LOW (ref >60.00)
Glucose, Bld: 104 mg/dL — ABNORMAL HIGH (ref 70–99)
Potassium: 5.3 mEq/L — ABNORMAL HIGH (ref 3.5–5.1)
Sodium: 137 mEq/L (ref 135–145)

## 2018-11-02 LAB — HEMOGLOBIN A1C: Hgb A1c MFr Bld: 7.9 % — ABNORMAL HIGH (ref 4.6–6.5)

## 2018-11-02 NOTE — Assessment & Plan Note (Signed)
Well-controlled.  Continue current regimen.  Check BMP. 

## 2018-11-02 NOTE — Progress Notes (Signed)
Tammy Alar, MD Phone: [phone]  Tammy Acosta is a 63 y.o. female who presents today for f/u.  DIABETES Disease Monitoring: Blood Sugar ranges-119 most recently Polyuria/phagia/dipsia- no      Optho- due Medications: Compliance- taking glargine 44 u most days, though does not take if her fasting cbg is low normal Hypoglycemic symptoms- no  HYPERTENSION  Disease Monitoring  Home BP Monitoring not checking Chest pain- no    Dyspnea- no Medications  Compliance-  Taking amlodipine, HCTZ, lisinopril.  Edema- no  HYPERLIPIDEMIA Symptoms Chest pain on exertion:  no   Medications: Compliance- taking lipitor Right upper quadrant pain- no  Muscle aches- no  Left shoulder pain: This is been going on for a while now.  Hurts intermittently depending on how she moves it.  No specific injury.  Hurts posteriorly.  Ibuprofen has been helpful.  Dental infection: She is currently on antibiotics through her dentist.  She is had no fevers.  Notes is hard to talk due to the discomfort.  Ibuprofen helps some with the pain.  She is having her tooth pulled next week.  Seizure history: Patient has had no recurrence of seizures.  She has consistently been taking her gabapentin.  She did not see neurology.  She had the seizures in the setting of suddenly discontinuing her gabapentin.     Social History   Tobacco Use  Smoking Status Current Some Day Smoker  . Packs/day: 0.25  . Years: 40.00  . Pack years: 10.00  . Types: Cigarettes  Smokeless Tobacco Never Used  Tobacco Comment   smokes when stressed "Not many"     ROS see history of present illness  Objective  Physical Exam Vitals:   11/02/18 1050  BP: 112/80  Pulse: 74  Temp: 97.9 F (36.6 C)  SpO2: 93%    BP Readings from Last 3 Encounters:  11/02/18 112/80  11/02/18 112/80  07/15/18 120/72   Wt Readings from Last 3 Encounters:  11/02/18 162 lb (73.5 kg)  11/02/18 162 lb (73.5 kg)  07/15/18 164 lb 12.8 oz  (74.8 kg)    Physical Exam Constitutional:      General: She is not in acute distress.    Appearance: She is not diaphoretic.  HENT:     Mouth/Throat:     Comments: Poor dentition particularly right lower mouth in the area of stated dental infection, tender over right lower jaw with no evidence of swelling or erythema Cardiovascular:     Rate and Rhythm: Normal rate and regular rhythm.     Heart sounds: Normal heart sounds.  Pulmonary:     Effort: Pulmonary effort is normal.     Breath sounds: Normal breath sounds.  Musculoskeletal:     Comments: Full range of motion bilateral shoulders actively and passively, no tenderness bilateral shoulders, bilateral shoulders are symmetric, there is discomfort on active and passive internal range of motion of the left shoulder, otherwise no discomfort, positive empty can on the left, negative empty can on the right, negative speeds bilaterally  Skin:    General: Skin is warm and dry.  Neurological:     Mental Status: She is alert.      Assessment/Plan: Please see individual problem list.  Essential (primary) hypertension Well-controlled.  Continue current regimen.  Check BMP.  Type 2 diabetes mellitus (HCC) Check A1c.  Continue current regimen.  Encouraged her to see ophthalmology.  Family history of colon cancer Refer to GI for colonoscopy.  Left shoulder pain I suspect  this is rotator cuff tendinitis.  Discussed physical therapy versus injection.  She opted to defer both of these at this time given wanting to take care of her dental infection first.  She will let us know when she would like to proceed with intervention for her shoulder pain.  X-ray to be completed today.  Seizure [facility]) She has had no recurrence.  This is in the setting of suddenly discontinuing her gabapentin.  I advised her not to discontinue this without a recommended taper from [place].  Dental infection Has been on antibiotics for about 2 days.  She will monitor on  her current prescription and contact her dentist if she does not start to improve.  She will continue to follow with her dentist for management of this issue.  Hyperlipidemia Continue Lipitor.   Health Maintenance: Patient to return for Pap smear at next visit.  Orders Placed This Encounter  Procedures  . DG Shoulder Left    Standing Status:   Future    Number of Occurrences:   1    Standing Expiration Date:   01/02/2020    Order Specific Question:   Reason for Exam (SYMPTOM  OR DIAGNOSIS REQUIRED)    Answer:   left shoulder pain    Order Specific Question:   Preferred imaging location?    Answer:   [facility] Specific Question:   Radiology Contrast Protocol - do NOT remove file path    Answer:   \\[email or web address].[email or web address]  . HgB A1c  . Basic Metabolic Panel (BMET)  . Ambulatory referral to Gastroenterology    Referral Priority:   Routine    Referral Type:   Consultation    Referral Reason:   Specialty Services Required    Number of Visits Requested:   1    No orders of the defined types were placed in this encounter.    Tammy Alar, MD [facility]

## 2018-11-02 NOTE — Patient Instructions (Addendum)
  Ms. Figeroa , Thank you for taking time to come for your Medicare Wellness Visit. I appreciate your ongoing commitment to your health goals. Please review the following plan we discussed and let me know if I can assist you in the future.   These are the goals we discussed: Goals      Patient Stated   . Quit Smoking (pt-stated)     Reduce the amount of smoking per day     . Weight (lb) < 150 lb (68 kg) (pt-stated)     Goal 145lb Stay as active as possible       This is a list of the screening recommended for you and due dates:  Health Maintenance  Topic Date Due  . HIV Screening  03/03/1970  . Pap Smear  03/03/1976  . Colon Cancer Screening  12/12/2012  . Eye exam for diabetics  02/05/2017  . Complete foot exam   10/15/2018  . Hemoglobin A1C  01/13/2019  . Mammogram  01/26/2020  . Tetanus Vaccine  02/04/2024  . Flu Shot  Completed  . Pneumococcal vaccine  Completed  .  Hepatitis C: One time screening is recommended by Center for Disease Control  (CDC) for  adults born from [birth year] through 1966.   Completed

## 2018-11-02 NOTE — Assessment & Plan Note (Signed)
Refer to GI for colonoscopy.

## 2018-11-02 NOTE — Assessment & Plan Note (Addendum)
Has been on antibiotics for about 2 days.  She will monitor on her current prescription and contact her dentist if she does not start to improve.  She will continue to follow with her dentist for management of this issue.

## 2018-11-02 NOTE — Assessment & Plan Note (Signed)
Check A1c.  Continue current regimen.  Encouraged her to see ophthalmology.

## 2018-11-02 NOTE — Patient Instructions (Signed)
Nice to see you. We will get an x-ray of your left shoulder.  When you are ready to proceed with physical therapy please let us know. Please continue to follow with your dentist for your dental infection.  If you develop worsening symptoms please be reevaluated. We will get lab work today and contact you with the results.

## 2018-11-02 NOTE — Progress Notes (Signed)
Subjective:   Tammy Acosta is a 63 y.o. female who presents for Medicare Annual (Subsequent) preventive examination.  Review of Systems:  No ROS.  Medicare Wellness Visit. Additional risk factors are reflected in the social history. Cardiac Risk Factors include: diabetes mellitus;hypertension;smoking/ tobacco exposure     Objective:     Vitals: BP 112/80 (BP Location: Left Arm, Patient Position: Sitting, Cuff Size: Normal)   Pulse 93   Temp 97.9 F (36.6 C) (Oral)   Resp 15   Ht 5' 2"  (1.575 m)   Wt 162 lb (73.5 kg)   SpO2 93%   BMI 29.63 kg/m   Body mass index is 29.63 kg/m.  Advanced Directives 11/02/2018 11/06/2016 09/24/2016 06/24/2016 03/30/2016 01/15/2016 10/21/2015  Does Patient Have a Medical Advance Directive? No No No No No No No  Does patient want to make changes to medical advance directive? No - Patient declined - - - - - -  Would patient like information on creating a medical advance directive? - Yes (MAU/Ambulatory/Procedural Areas - Information given) No - Patient declined - No - patient declined information - -    Tobacco Social History   Tobacco Use  Smoking Status Current Some Day Smoker  . Packs/day: 0.25  . Years: 40.00  . Pack years: 10.00  . Types: Cigarettes  Smokeless Tobacco Never Used  Tobacco Comment   smokes when stressed "Not many"     Ready to quit: Not Answered Counseling given: Not Answered Comment: smokes when stressed "Not many"   Clinical Intake:  Pre-visit preparation completed: Yes        Diabetes: Yes(Followed by pcp)  How often do you need to have someone help you when you read instructions, pamphlets, or other written materials from your doctor or pharmacy?: 1 - Never  Interpreter Needed?: No     Past Medical History:  Diagnosis Date  . Altered mental status 02/04/2014  . Arthritis   . Colon polyps   . Depression   . Diabetes mellitus without complication ([place])    Pt states she takes Insulin and metformin.   Marland Kitchen Hypercholesteremia   . Hyperlipidemia   . Hypertension   . Shingles    10/17  . Spine disorder   . Viral upper respiratory illness 12/03/2015   Past Surgical History:  Procedure Laterality Date  . ABDOMINAL HYSTERECTOMY    . BACK SURGERY    . CHOLECYSTECTOMY    . KYPHOPLASTY     Family History  Problem Relation Age of Onset  . Heart disease Mother   . Hypertension Mother   . Cancer Mother        breast  . Breast cancer Mother        early 9's  . Heart disease Father   . Arthritis Other        Parent, other relative  . Colon cancer Other        Parent  . Lung cancer Other        Other relative  . Diabetes Other        Other relative  . Uterine cancer Other        Other relative  . Cancer Maternal Aunt        breast  . Breast cancer Maternal Aunt   . Cancer Maternal Aunt        ovarian  . Breast cancer Cousin   . Breast cancer Cousin   . Breast cancer Cousin    Social History  Socioeconomic History  . Marital status: Divorced    Spouse name: Not on file  . Number of children: Not on file  . Years of education: Not on file  . Highest education level: Not on file  Occupational History  . Not on file  Social Needs  . Financial resource strain: Not hard at all  . Food insecurity:    Worry: Never true    Inability: Never true  . Transportation needs:    Medical: No    Non-medical: No  Tobacco Use  . Smoking status: Current Some Day Smoker    Packs/day: 0.25    Years: 40.00    Pack years: 10.00    Types: Cigarettes  . Smokeless tobacco: Never Used  . Tobacco comment: smokes when stressed "Not many"  Substance and Sexual Activity  . Alcohol use: No  . Drug use: No  . Sexual activity: Never  Lifestyle  . Physical activity:    Days per week: 0 days    Minutes per session: Not on file  . Stress: Not on file  Relationships  . Social connections:    Talks on phone: Not on file    Gets together: Not on file    Attends religious service: Not on  file    Active member of club or organization: Not on file    Attends meetings of clubs or organizations: Not on file    Relationship status: Not on file  Other Topics Concern  . Not on file  Social History Narrative  . Not on file    Outpatient Encounter Medications as of 11/02/2018  Medication Sig  . amLODipine (NORVASC) 5 MG tablet Take 1 tablet (5 mg total) by mouth daily.  Marland Kitchen atorvastatin (LIPITOR) 80 MG tablet Take 1 tablet (80 mg total) by mouth daily.  . blood glucose meter kit and supplies Dispense One touch meter.  E11.9  . Blood Glucose Monitoring Suppl (ONE TOUCH ULTRA 2) w/Device KIT Dispense 1 meter to use to test blood glucose once daily. Dx code: E11.42.  Marland Kitchen gabapentin (NEURONTIN) 800 MG tablet Take 1 tablet (800 mg total) by mouth 4 (four) times daily.  Marland Kitchen glucose blood (ONE TOUCH ULTRA TEST) test strip Use as directed to test blood glucose up to three times daily. Dx code: E11.42.  . hydrochlorothiazide (HYDRODIURIL) 25 MG tablet TAKE 1 TABLET BY MOUTH EVERY DAY  . Insulin Glargine (BASAGLAR KWIKPEN) 100 UNIT/ML SOPN Inject 0.44 mLs (44 Units total) into the skin daily.  . Insulin Pen Needle (PEN NEEDLES) 32G X 5 MM MISC 1 pen by Does not apply route daily. Use to inject insulin daily  . Lancets (ONETOUCH ULTRASOFT) lancets Use as instructed to test blood glucose up to three times daily. Dx code: E11.42.  Marland Kitchen lisinopril (PRINIVIL,ZESTRIL) 40 MG tablet Take 1 tablet (40 mg total) by mouth daily.  . metFORMIN (GLUCOPHAGE) 1000 MG tablet TAKE 1 TABLET BY MOUTH TWICE DAILY WITH A MEAL  . naproxen sodium (ALEVE) 220 MG tablet Take 220 mg by mouth 2 (two) times daily.  . nitroGLYCERIN (NITROSTAT) 0.4 MG SL tablet Place 1 tablet (0.4 mg total) under the tongue every 5 (five) minutes as needed for chest pain.  Marland Kitchen sertraline (ZOLOFT) 100 MG tablet TAKE 1 TABLET(100 MG) BY MOUTH DAILY  . tiZANidine (ZANAFLEX) 4 MG tablet TAKE 1 TABLET BY MOUTH THREE TIMES DAILY  . cephALEXin (KEFLEX) 500  MG capsule TAKE 1 CAPSULE BY MOUTH EVERY 6 HOURS FOR 7 DAYS  .  ibuprofen (ADVIL,MOTRIN) 800 MG tablet Take 800 mg by mouth every 8 (eight) hours as needed. for pain  . Icosapent Ethyl (VASCEPA) 1 g CAPS Take 2 capsules (2 g total) by mouth 2 (two) times daily.   No facility-administered encounter medications on file as of 11/02/2018.     Activities of Daily Living In your present state of health, do you have any difficulty performing the following activities: 11/02/2018  Hearing? N  Vision? N  Difficulty concentrating or making decisions? N  Walking or climbing stairs? Y  Comment Chronic back pain  Dressing or bathing? N  Doing errands, shopping? N  Preparing Food and eating ? N  Using the Toilet? N  In the past six months, have you accidently leaked urine? N  Do you have problems with loss of bowel control? N  Managing your Medications? N  Managing your Finances? N  Housekeeping or managing your Housekeeping? Y  Comment Difficult somewhat due to chronic back pain  Some recent data might be hidden    Patient Care Team: Leone Haven, MD as PCP - General (Family Medicine)    Assessment:   This is a routine wellness examination for Tammy Acosta.  Colonoscopy deferred per patient request.   Agrees to schedule diabetic eye exam.  Health Screenings  Colonoscopy -12/13/02 Glaucoma -none Hearing -demonstrates normal hearing during conversation Hemoglobin A1C -07/15/18 Cholesterol -07/15/18 Dental -annual  Social  Alcohol intake -no Smoking history- current Smokers in home? self Illicit drug use? none Exercise -none due to chronic back pain Diet -low carb foods Sexually Active- never   Safety  Patient feels safe at home.  Patient does have smoke detectors at home  Patient does wear sunscreen or protective clothing when in direct sunlight  Patient does wear seat belt when driving or riding with others.   Activities of Daily Living Patient can do their own household  chores moving at a slow pace.  Denies needing assistance with: driving, feeding themselves, getting from bed to chair, getting to the toilet, bathing/showering, dressing, managing money, climbing flight of stairs, or preparing meals.   Depression Screen Patient denies losing interest in daily life, feeling hopeless, or crying easily over simple problems.   Fall Screen Patient denies being afraid of falling or falling in the last year.   Memory Screen Patient denies problems with memory, misplacing items, and is able to balance checkbook/bank accounts.  Patient is alert, normal appearance, oriented to person/place/and time. Correctly identified the president of the [place], recall of 2/3 objects, and performing simple calculations.  Patient displays appropriate judgement and can read correct time from watch face.   Immunizations The following Immunizations are up to date: Influenza, shingles, pneumonia, and tetanus.   Other Providers Patient Care Team: Leone Haven, MD as PCP - General (Family Medicine)  Exercise Activities and Dietary recommendations Current Exercise Habits: The patient does not participate in regular exercise at present  Goals      Patient Stated   . Quit Smoking (pt-stated)     Reduce the amount of smoking per day     . Weight (lb) < 150 lb (68 kg) (pt-stated)     Goal 145lb Stay as active as possible       Fall Risk Fall Risk  11/02/2018 11/06/2016 09/24/2016 06/24/2016 03/30/2016  Falls in the past year? 0 Yes No No No  Number falls in past yr: - 1 - - -  Risk for fall due to : - (No Data) - - -  Risk for fall due to: Comment - L leg weakness - - -  Follow up - Falls prevention discussed - - -   Depression Screen PHQ 2/9 Scores 11/02/2018 07/15/2018 11/06/2016 09/24/2016  PHQ - 2 Score 0 0 0 0  PHQ- 9 Score - - - -  Exception Documentation - - - -     Cognitive Function MMSE - Mini Mental State Exam 11/06/2016  Orientation to time 5  Orientation to  Place 5  Registration 3  Attention/ Calculation 5  Recall 3  Language- name 2 objects 2  Language- repeat 1  Language- follow 3 step command 3  Language- read & follow direction 1  Write a sentence 1  Copy design 1  Total score 30     6CIT Screen 11/02/2018  What Year? 0 points  What month? 0 points  What time? 0 points  Count back from 20 0 points  Months in reverse 0 points  Repeat phrase 0 points  Total Score 0    Immunization History  Administered Date(s) Administered  . Influenza,inj,Quad PF,6+ Mos 10/30/2015, 07/01/2017, 07/15/2018  . Pneumococcal Polysaccharide-23 10/30/2015  . Tdap 02/03/2014   Screening Tests Health Maintenance  Topic Date Due  . HIV Screening  03/03/1970  . PAP SMEAR-Modifier  03/03/1976  . COLONOSCOPY  12/12/2012  . OPHTHALMOLOGY EXAM  02/05/2017  . FOOT EXAM  10/15/2018  . HEMOGLOBIN A1C  01/13/2019  . MAMMOGRAM  01/26/2020  . TETANUS/TDAP  02/04/2024  . INFLUENZA VACCINE  Completed  . PNEUMOCOCCAL POLYSACCHARIDE VACCINE AGE 23-64 HIGH RISK  Completed  . Hepatitis C Screening  Completed      Plan:    End of life planning; Advanced aging; Advanced directives discussed.  No HCPOA/Living Will.  Additional information declined at this time.  I have personally reviewed and noted the following in the patient's chart:   . Medical and social history . Use of alcohol, tobacco or illicit drugs  . Current medications and supplements . Functional ability and status . Nutritional status . Physical activity . Advanced directives . List of other physicians . Hospitalizations, surgeries, and ER visits in previous 12 months . Vitals . Screenings to include cognitive, depression, and falls . Referrals and appointments  In addition, I have reviewed and discussed with patient certain preventive protocols, quality metrics, and best practice recommendations. A written personalized care plan for preventive services as well as general preventive  health recommendations were provided to patient.     Varney Biles, LPN  1/79/1505

## 2018-11-02 NOTE — Assessment & Plan Note (Signed)
She has had no recurrence.  This is in the setting of suddenly discontinuing her gabapentin.  I advised her not to discontinue this without a recommended taper from [place].

## 2018-11-02 NOTE — Assessment & Plan Note (Signed)
-  Continue Lipitor °

## 2018-11-02 NOTE — Assessment & Plan Note (Signed)
I suspect this is rotator cuff tendinitis.  Discussed physical therapy versus injection.  She opted to defer both of these at this time given wanting to take care of her dental infection first.  She will let us know when she would like to proceed with intervention for her shoulder pain.  X-ray to be completed today.

## 2018-11-07 ENCOUNTER — Other Ambulatory Visit: Payer: Self-pay | Admitting: Family Medicine

## 2018-11-07 ENCOUNTER — Other Ambulatory Visit: Payer: Medicare Other

## 2018-11-07 DIAGNOSIS — E875 Hyperkalemia: Secondary | ICD-10-CM

## 2018-11-07 NOTE — Progress Notes (Signed)
I have reviewed the above note and agree.  Manly Nestle, M.D.  

## 2018-11-09 ENCOUNTER — Other Ambulatory Visit: Payer: Medicare Other

## 2018-11-10 ENCOUNTER — Other Ambulatory Visit: Payer: Self-pay | Admitting: Family Medicine

## 2018-11-10 ENCOUNTER — Telehealth: Payer: Self-pay | Admitting: Family Medicine

## 2018-11-10 DIAGNOSIS — M7918 Myalgia, other site: Secondary | ICD-10-CM

## 2018-11-10 MED ORDER — HYDROCHLOROTHIAZIDE 25 MG PO TABS: 25.0000 mg | ORAL_TABLET | Freq: Every day | ORAL | 1 refills | Status: DC

## 2018-11-10 NOTE — Telephone Encounter (Signed)
Rx has been sent to [facility]

## 2018-11-10 NOTE — Telephone Encounter (Signed)
Copied from CRM [id number]. Topic: Quick Communication - Rx Refill/Question >> Nov 10, 2018 12:27 PM Jolayne Haines L wrote: Medication: hydrochlorothiazide (HYDRODIURIL) 25 MG tablet  Has the patient contacted their pharmacy? Yes (Agent: If no, request that the patient contact the pharmacy for the refill.) (Agent: If yes, when and what did the pharmacy advise?)  Preferred Pharmacy (with phone number or street name): [facility] [address], [place] - [address] [place] [address] Phone: [phone] Fax: [phone]    Agent: Please be advised that RX refills may take up to 3 business days. We ask that you follow-up with your pharmacy.

## 2018-11-11 ENCOUNTER — Telehealth: Payer: Self-pay | Admitting: Family Medicine

## 2018-11-11 NOTE — Telephone Encounter (Signed)
Copied from CRM [id number]. Topic: Quick Communication - See Telephone Encounter >> Nov 11, 2018  5:09 PM Jens Som A wrote: CRM for notification. See Telephone encounter for: 11/11/18.  Patient is calling to find out why her appt is to be rescheduled to earlier. Please advise [phone]

## 2018-11-11 NOTE — Telephone Encounter (Signed)
Last OV 11/02/2018  Last refilled 10/11/2018 disp 90 no refills   Next appt 01/04/2019   Sent to PCP for approval

## 2018-11-14 ENCOUNTER — Other Ambulatory Visit (INDEPENDENT_AMBULATORY_CARE_PROVIDER_SITE_OTHER): Payer: Medicare Other

## 2018-11-14 ENCOUNTER — Other Ambulatory Visit: Payer: Self-pay

## 2018-11-14 DIAGNOSIS — E875 Hyperkalemia: Secondary | ICD-10-CM

## 2018-11-14 DIAGNOSIS — E785 Hyperlipidemia, unspecified: Secondary | ICD-10-CM

## 2018-11-14 LAB — HEPATIC FUNCTION PANEL
ALT: 17 U/L (ref 0–35)
AST: 16 U/L (ref 0–37)
Albumin: 4 g/dL (ref 3.5–5.2)
Alkaline Phosphatase: 69 U/L (ref 39–117)
Bilirubin, Direct: 0 mg/dL (ref 0.0–0.3)
Total Bilirubin: 0.2 mg/dL (ref 0.2–1.2)
Total Protein: 7.2 g/dL (ref 6.0–8.3)

## 2018-11-14 LAB — BASIC METABOLIC PANEL
BUN: 21 mg/dL (ref 6–23)
CO2: 26 mEq/L (ref 19–32)
Calcium: 9 mg/dL (ref 8.4–10.5)
Chloride: 100 mEq/L (ref 96–112)
Creatinine, Ser: 1.17 mg/dL (ref 0.40–1.20)
GFR: 46.61 mL/min — ABNORMAL LOW (ref >60.00)
Glucose, Bld: 198 mg/dL — ABNORMAL HIGH (ref 70–99)
Potassium: 4.3 mEq/L (ref 3.5–5.1)
Sodium: 136 mEq/L (ref 135–145)

## 2018-11-14 LAB — LDL CHOLESTEROL, DIRECT: LDL DIRECT: 56 mg/dL

## 2018-11-14 NOTE — Telephone Encounter (Signed)
I do not know what this is regarding. It may be related to her lab appointment. Please check with some one that does scheduling to see what the issue is.

## 2018-11-21 ENCOUNTER — Encounter: Payer: Self-pay | Admitting: *Deleted

## 2018-12-11 ENCOUNTER — Other Ambulatory Visit: Payer: Self-pay | Admitting: Family Medicine

## 2018-12-11 DIAGNOSIS — M7918 Myalgia, other site: Secondary | ICD-10-CM

## 2018-12-12 ENCOUNTER — Other Ambulatory Visit: Payer: Self-pay | Admitting: Family Medicine

## 2018-12-12 DIAGNOSIS — M7918 Myalgia, other site: Secondary | ICD-10-CM

## 2018-12-12 NOTE — Telephone Encounter (Signed)
Last OV  11/02/2018  Last refilled 11/11/2018 disp 90 with no refill   Next OV 01/03/201

## 2018-12-17 ENCOUNTER — Telehealth: Payer: Self-pay | Admitting: Family Medicine

## 2018-12-17 MED ORDER — BASAGLAR KWIKPEN 100 UNIT/ML ~~LOC~~ SOPN: 44.0000 [IU] | PEN_INJECTOR | Freq: Every day | SUBCUTANEOUS | 1 refills | Status: DC

## 2018-12-17 NOTE — Telephone Encounter (Signed)
Please fax prescription for insulin. Placed on your desk.

## 2018-12-19 NOTE — Telephone Encounter (Signed)
Sent to be faxed.  

## 2019-01-02 ENCOUNTER — Other Ambulatory Visit: Payer: Self-pay

## 2019-01-04 ENCOUNTER — Ambulatory Visit (INDEPENDENT_AMBULATORY_CARE_PROVIDER_SITE_OTHER): Payer: Medicare Other | Admitting: Family Medicine

## 2019-01-04 ENCOUNTER — Encounter: Payer: Self-pay | Admitting: Family Medicine

## 2019-01-04 ENCOUNTER — Other Ambulatory Visit: Payer: Self-pay

## 2019-01-04 DIAGNOSIS — M545 Low back pain, unspecified: Secondary | ICD-10-CM

## 2019-01-04 DIAGNOSIS — G8929 Other chronic pain: Secondary | ICD-10-CM | POA: Diagnosis not present

## 2019-01-04 MED ORDER — PREDNISONE 20 MG PO TABS: 40.0000 mg | ORAL_TABLET | Freq: Every day | ORAL | 0 refills | Status: DC

## 2019-01-04 NOTE — Progress Notes (Signed)
Virtual Visit via telephone Note  This visit type was conducted due to national recommendations for restrictions regarding the COVID-19 pandemic (e.g. social distancing).  This format is felt to be most appropriate for this patient at this time.  All issues noted in this document were discussed and addressed.  No physical exam was performed (except for noted visual exam findings with Video Visits).   I connected with Tammy Acosta today at 11:30 AM EDT by telephone and verified that I am speaking with the correct person using two identifiers. Location patient: home Location provider: work or home office Persons participating in the virtual visit: patient, provider  I discussed the limitations, risks, security and privacy concerns of performing an evaluation and management service by telephone and the availability of in person appointments. I also discussed with the patient that there may be a patient responsible charge related to this service. The patient expressed understanding and agreed to proceed.  Interactive audio and video telecommunications were attempted between this provider and patient, however failed, due to patient having technical difficulties OR patient did not have access to video capability.  We continued and completed visit with audio only.  Reason for visit: Same day visit  HPI: Back pain: Patient notes she has continued to have issues with low back pain.  She notes it has slowly gotten worse.  No acute changes.  No injury.  She does note it radiates down the back of her legs though she also has upper leg pain as well.  No numbness.  She does feel her legs will give out on her due to the pain.  No incontinence.  No claudication.  No fevers.  She does note a pins and needle sensation in her back at times.  She does take tizanidine with mild benefit.  Tylenol is not beneficial.  She was on Aleve though we had to stop this related to kidney function.  She has had a back fusion  in the lumbar spine and thoracic spine and has had a kyphoplasty.  She notes her CBGs range between 118 and 132.   ROS: See pertinent positives and negatives per HPI.  Past Medical History:  Diagnosis Date   Altered mental status 02/04/2014   Arthritis    Colon polyps    Depression    Diabetes mellitus without complication ([place])    Pt states she takes Insulin and metformin.   Hypercholesteremia    Hyperlipidemia    Hypertension    Shingles    10/17   Spine disorder    Viral upper respiratory illness 12/03/2015    Past Surgical History:  Procedure Laterality Date   ABDOMINAL HYSTERECTOMY     BACK SURGERY     CHOLECYSTECTOMY     KYPHOPLASTY      Family History  Problem Relation Age of Onset   Heart disease Mother    Hypertension Mother    Cancer Mother        breast   Breast cancer Mother        early 59's   Heart disease Father    Arthritis Other        Parent, other relative   Colon cancer Other        Parent   Lung cancer Other        Other relative   Diabetes Other        Other relative   Uterine cancer Other        Other relative   Cancer  Maternal Aunt        breast   Breast cancer Maternal Aunt    Cancer Maternal Aunt        ovarian   Breast cancer Cousin    Breast cancer Cousin    Breast cancer Cousin     SOCIAL HX: Smoker   Current Outpatient Medications:    amLODipine (NORVASC) 5 MG tablet, Take 1 tablet (5 mg total) by mouth daily., Disp: 90 tablet, Rfl: 0   atorvastatin (LIPITOR) 80 MG tablet, Take 1 tablet (80 mg total) by mouth daily., Disp: 90 tablet, Rfl: 1   blood glucose meter kit and supplies, Dispense One touch meter.  E11.9, Disp: 1 each, Rfl: 0   Blood Glucose Monitoring Suppl (ONE TOUCH ULTRA 2) w/Device KIT, Dispense 1 meter to use to test blood glucose once daily. Dx code: E11.42., Disp: 1 each, Rfl: 0   gabapentin (NEURONTIN) 800 MG tablet, Take 1 tablet (800 mg total) by mouth 4 (four) times  daily., Disp: 360 tablet, Rfl: 1   glucose blood (ONE TOUCH ULTRA TEST) test strip, Use as directed to test blood glucose up to three times daily. Dx code: E11.42., Disp: 100 each, Rfl: 12   hydrochlorothiazide (HYDRODIURIL) 25 MG tablet, Take 1 tablet (25 mg total) by mouth daily., Disp: 90 tablet, Rfl: 1   Insulin Glargine (BASAGLAR KWIKPEN) 100 UNIT/ML SOPN, Inject 0.44 mLs (44 Units total) into the skin daily., Disp: 60 mL, Rfl: 1   Insulin Pen Needle (PEN NEEDLES) 32G X 5 MM MISC, 1 pen by Does not apply route daily. Use to inject insulin daily, Disp: 100 each, Rfl: 4   Lancets (ONETOUCH ULTRASOFT) lancets, Use as instructed to test blood glucose up to three times daily. Dx code: E11.42., Disp: 100 each, Rfl: 12   lisinopril (PRINIVIL,ZESTRIL) 40 MG tablet, Take 1 tablet (40 mg total) by mouth daily., Disp: 90 tablet, Rfl: 1   metFORMIN (GLUCOPHAGE) 1000 MG tablet, TAKE 1 TABLET BY MOUTH TWICE DAILY WITH A MEAL, Disp: 60 tablet, Rfl: 3   nitroGLYCERIN (NITROSTAT) 0.4 MG SL tablet, Place 1 tablet (0.4 mg total) under the tongue every 5 (five) minutes as needed for chest pain., Disp: 25 tablet, Rfl: 6   sertraline (ZOLOFT) 100 MG tablet, TAKE 1 TABLET(100 MG) BY MOUTH DAILY, Disp: 90 tablet, Rfl: 1   tiZANidine (ZANAFLEX) 4 MG tablet, TAKE 1 TABLET BY MOUTH EVERY 8 HOURS AS NEEDED FOR MUSCLE SPASM, Disp: 90 tablet, Rfl: 0   cephALEXin (KEFLEX) 500 MG capsule, TAKE 1 CAPSULE BY MOUTH EVERY 6 HOURS FOR 7 DAYS, Disp: , Rfl:    ibuprofen (ADVIL,MOTRIN) 800 MG tablet, Take 800 mg by mouth every 8 (eight) hours as needed. for pain, Disp: , Rfl:    Icosapent Ethyl (VASCEPA) 1 g CAPS, Take 2 capsules (2 g total) by mouth 2 (two) times daily. (Patient not taking: Reported on 01/04/2019), Disp: 120 capsule, Rfl: 3   naproxen sodium (ALEVE) 220 MG tablet, Take 220 mg by mouth 2 (two) times daily., Disp: , Rfl:    predniSONE (DELTASONE) 20 MG tablet, Take 2 tablets (40 mg total) by mouth daily  with breakfast., Disp: 10 tablet, Rfl: 0  EXAM: This was a telehealth telephone visit and thus no physical exam was completed.  ASSESSMENT AND PLAN:  Discussed the following assessment and plan:  Chronic low back pain (Location of Primary Source of Pain) (Bilateral) (L>R) - Plan: DG Lumbar Spine Complete  Chronic low back pain (Location of  Primary Source of Pain) (Bilateral) (L>R) Patient with progressive worsening of her back pain.  Not responsive to Tylenol.  It sounds as though she likely has nerve impingement.  We will treat with prednisone.  She will monitor her blood sugars and contact us if they go above 200 consistently.  We will complete an x-ray next week when she is in the office for her Pap smear.  Discussed likely referral to orthopedics depending on x-ray findings and response to prednisone.    I discussed the assessment and treatment plan with the patient. The patient was provided an opportunity to ask questions and all were answered. The patient agreed with the plan and demonstrated an understanding of the instructions.   The patient was advised to call back or seek an in-person evaluation if the symptoms worsen or if the condition fails to improve as anticipated.  I provided 12 minutes of non-face-to-face time during this encounter.   Tommi Rumps, MD

## 2019-01-04 NOTE — Assessment & Plan Note (Addendum)
Patient with progressive worsening of her back pain.  Not responsive to Tylenol.  It sounds as though she likely has nerve impingement.  We will treat with prednisone.  She will monitor her blood sugars and contact us if they go above 200 consistently.  We will complete an x-ray next week when she is in the office for her Pap smear.  Discussed likely referral to orthopedics depending on x-ray findings and response to prednisone.

## 2019-01-05 ENCOUNTER — Ambulatory Visit: Payer: Self-pay

## 2019-01-05 NOTE — Telephone Encounter (Signed)
Incoming call from Patient.  Reports that Dr.  Devota Pace put Patient on  Prednisone,  For back pain.    Patient   Checked blood sugar and it was 389.   Patient was told to check it due to prednisone can elevate blood sugar.  Did not have sliding scale.  Patient states that  she will not take the dose of Prednisone and wait until Dr.  Birdie Sons  On Monday.  Request that He call her.   Reason for Disposition . [1] Blood glucose > 240 mg/dL (16.3 mmol/L) AND [8] rapid breathing  Answer Assessment - Initial Assessment Questions 1. BLOOD GLUCOSE: "What is your blood glucose level?"      389 2. ONSET: "When did you check the blood glucose?"    4:15pm 3. USUAL RANGE: "What is your glucose level usually?" (e.g., usual fasting morning value, usual evening value)    afternoon 4. KETONES: "Do you check for ketones (urine or blood test strips)?" If yes, ask: "What does the test show now?"      5. TYPE 1 or 2:  "Do you know what type of diabetes you have?"  (e.g., Type 1, Type 2, Gestational; doesn't know)      Type 2 6. INSULIN: "Do you take insulin?" "What type of insulin(s) do you use? What is the mode of delivery? (syringe, pen (e.g., injection or  pump)?"      denies 7. DIABETES PILLS: "Do you take any pills for your diabetes?" If yes, ask: "Have you missed taking any pills recently?"     yes 8. OTHER SYMPTOMS: "Do you have any symptoms?" (e.g., fever, frequent urination, difficulty breathing, dizziness, weakness, vomiting)     Denies 9. PREGNANCY: "Is there any chance you are pregnant?" "When was your last menstrual period?"     na  Protocols used: DIABETES - HIGH BLOOD SUGAR-A-AH

## 2019-01-06 NOTE — Telephone Encounter (Signed)
Pt has an appt on 01/11/19

## 2019-01-06 NOTE — Telephone Encounter (Signed)
Noted. I would advise that she discontinue the prednisone and monitor her glucose. If it goes any higher or she develops nausea, vomiting, or confusion she needs to be evaluated at urgent care. We will need to refer to orthopedics for evaluation.

## 2019-01-09 ENCOUNTER — Telehealth: Payer: Self-pay

## 2019-01-09 DIAGNOSIS — G8929 Other chronic pain: Secondary | ICD-10-CM

## 2019-01-09 NOTE — Addendum Note (Signed)
Addended by: Glori Luis on: 01/09/2019 04:59 PM   Modules accepted: Orders

## 2019-01-09 NOTE — Telephone Encounter (Signed)
Referral placed.

## 2019-01-09 NOTE — Telephone Encounter (Signed)
Noted. Please see what her blood sugar is running currently. I think at this point we will need to have her see a specialist for her back. I would not place her on steroids again and she can not take aleve or ibuprofen given her kidney function.

## 2019-01-09 NOTE — Telephone Encounter (Signed)
Pt stated that she had to stop taking the prednisone due to increased BP to 400's pt pt stated that her insulin was so high on Thursday that she had to give herself 2 injections. Pt stated that she doesn't know what to do at this point because the predisone did help but it is making her BS elevated and causing really bad headaches.   Pt stated that she tried to call last week but by the time she called the office was closed.

## 2019-01-09 NOTE — Telephone Encounter (Signed)
Called and spoke with pt. Pt's BS today was 132 this morning. Stopped the prednisone on Thursday last week.  Pt is willing to see a back specialist.   Sent to PCP to place order once done let me know so I can advise pt.   Thanks

## 2019-01-09 NOTE — Telephone Encounter (Signed)
Copied from CRM [id number]. Topic: General - Other >> Jan 06, 2019  4:23 PM Tamela Oddi wrote: Reason for CRM: [facility] called the doctor's office to state that they have been trying to contract the patient unsuccessfully.  They would like for the patient to contact them at [phone], Attention - Darrellitha.  They asked if the doctor's office could let the patient know to call them because they have been trying to reach the patient.  Message was sent to patient via Mychart to call the pharmacy.  Jesusa Stenerson,cma

## 2019-01-09 NOTE — Telephone Encounter (Signed)
Called and spoke with pt. Pt advised and will reach out to them. Per pt she did receive an e-mail.

## 2019-01-10 ENCOUNTER — Other Ambulatory Visit: Payer: Self-pay | Admitting: Family Medicine

## 2019-01-10 DIAGNOSIS — M7918 Myalgia, other site: Secondary | ICD-10-CM

## 2019-01-10 NOTE — Telephone Encounter (Signed)
Last OV 01/04/2019  Last refilled  tiZANidine (ZANAFLEX) 4 MG tablet 90 tablet 0 12/12/2018     Next OV 01/11/2019  Sent to PCP for approval

## 2019-01-10 NOTE — Telephone Encounter (Signed)
Called and spoke with pt. Pt advised and voiced understanding.  

## 2019-01-11 ENCOUNTER — Ambulatory Visit: Payer: Medicare Other | Admitting: Family Medicine

## 2019-01-11 ENCOUNTER — Other Ambulatory Visit: Payer: Self-pay | Admitting: Family Medicine

## 2019-01-25 ENCOUNTER — Telehealth: Payer: Self-pay | Admitting: Family Medicine

## 2019-01-25 NOTE — Telephone Encounter (Signed)
Pt dropped off DMV paperwork to be filled out.  Placed in Dr. Kermit Balo color folder upfront  Please fax when completed

## 2019-01-26 NOTE — Telephone Encounter (Signed)
DMV form put in blue "to be signed" folder on your desk.  Nina,cma

## 2019-02-02 NOTE — Telephone Encounter (Signed)
I have reviewed her paperwork and completed what I am able to complete. Can you get her on the schedule for tomorrow afternoon for a virtual visit so we can confirm the details of the form and make sure everything is uptodate? Once that is done I can sign off on the form and it will be available for her to pick up.

## 2019-02-06 NOTE — Telephone Encounter (Signed)
Called and left a voicemail for pt to call back and schedule a virtual visit per provider request to finish her DMV form.  Nina,cma

## 2019-02-08 ENCOUNTER — Other Ambulatory Visit: Payer: Self-pay | Admitting: Family Medicine

## 2019-02-08 DIAGNOSIS — M792 Neuralgia and neuritis, unspecified: Secondary | ICD-10-CM

## 2019-02-08 DIAGNOSIS — M7918 Myalgia, other site: Secondary | ICD-10-CM

## 2019-02-08 DIAGNOSIS — B0222 Postherpetic trigeminal neuralgia: Secondary | ICD-10-CM

## 2019-02-08 NOTE — Telephone Encounter (Signed)
Please contact the patient again and try to get her set up for a virtual visit to complete this paperwork. She will also need to sign the DMV forms so we can send them in. I could see her on Friday at 1 pm for the virtual visit. Thanks.

## 2019-02-10 ENCOUNTER — Ambulatory Visit (INDEPENDENT_AMBULATORY_CARE_PROVIDER_SITE_OTHER): Payer: Medicare Other | Admitting: Family Medicine

## 2019-02-10 ENCOUNTER — Other Ambulatory Visit: Payer: Self-pay

## 2019-02-10 DIAGNOSIS — G8929 Other chronic pain: Secondary | ICD-10-CM

## 2019-02-10 DIAGNOSIS — I10 Essential (primary) hypertension: Secondary | ICD-10-CM | POA: Diagnosis not present

## 2019-02-10 DIAGNOSIS — M545 Low back pain, unspecified: Secondary | ICD-10-CM

## 2019-02-10 DIAGNOSIS — R569 Unspecified convulsions: Secondary | ICD-10-CM

## 2019-02-10 DIAGNOSIS — E1142 Type 2 diabetes mellitus with diabetic polyneuropathy: Secondary | ICD-10-CM

## 2019-02-10 NOTE — Progress Notes (Signed)
Virtual Visit via telephone Note  This visit type was conducted due to national recommendations for restrictions regarding the COVID-19 pandemic (e.g. social distancing).  This format is felt to be most appropriate for this patient at this time.  All issues noted in this document were discussed and addressed.  No physical exam was performed (except for noted visual exam findings with Video Visits).   I connected with Tammy Acosta today at  1:00 PM EDT by telephone and verified that I am speaking with the correct person using two identifiers. Location patient: son's house Location provider: work  Persons participating in the virtual visit: patient, provider  I discussed the limitations, risks, security and privacy concerns of performing an evaluation and management service by telephone and the availability of in person appointments. I also discussed with the patient that there may be a patient responsible charge related to this service. The patient expressed understanding and agreed to proceed.  Interactive audio and video telecommunications were attempted between this provider and patient, however failed, due to patient having technical difficulties OR patient did not have access to video capability.  We continued and completed visit with audio only.   Reason for visit: follow-up  HPI: Chronic back pain: Patient notes this is stable.  She has an appointment to see neurosurgery soon.  She does note radiation down both of her legs.  No numbness.  Occasionally has weakness with walking that is chronic and stable.  No incontinence.  This issue does not interfere with her ability to drive.  History of seizures: She has had no additional seizures since her initial episode after stopping gabapentin on her own.  She did not see neurology for this given that it was associated with sudden discontinuation of gabapentin.  Hypertension: No chest pain, shortness of breath, or edema.  Continues on her  amlodipine, HCTZ, and lisinopril.  Diabetes: Last CBG was 123.  No polyuria or polydipsia.  Hypoxia.  Continues on basiglar and metformin.   ROS: See pertinent positives and negatives per HPI.  Past Medical History:  Diagnosis Date  . Altered mental status 02/04/2014  . Arthritis   . Colon polyps   . Depression   . Diabetes mellitus without complication ([place])    Pt states she takes Insulin and metformin.  Marland Kitchen Hypercholesteremia   . Hyperlipidemia   . Hypertension   . Shingles    10/17  . Spine disorder   . Viral upper respiratory illness 12/03/2015    Past Surgical History:  Procedure Laterality Date  . ABDOMINAL HYSTERECTOMY    . BACK SURGERY    . CHOLECYSTECTOMY    . KYPHOPLASTY      Family History  Problem Relation Age of Onset  . Heart disease Mother   . Hypertension Mother   . Cancer Mother        breast  . Breast cancer Mother        early 65's  . Heart disease Father   . Arthritis Other        Parent, other relative  . Colon cancer Other        Parent  . Lung cancer Other        Other relative  . Diabetes Other        Other relative  . Uterine cancer Other        Other relative  . Cancer Maternal Aunt        breast  . Breast cancer Maternal Aunt   .  Cancer Maternal Aunt        ovarian  . Breast cancer Cousin   . Breast cancer Cousin   . Breast cancer Cousin     SOCIAL HX: Smoker   Current Outpatient Medications:  .  amLODipine (NORVASC) 5 MG tablet, Take 1 tablet by mouth once daily, Disp: 90 tablet, Rfl: 0 .  atorvastatin (LIPITOR) 80 MG tablet, Take 1 tablet (80 mg total) by mouth daily., Disp: 90 tablet, Rfl: 1 .  blood glucose meter kit and supplies, Dispense One touch meter.  E11.9, Disp: 1 each, Rfl: 0 .  Blood Glucose Monitoring Suppl (ONE TOUCH ULTRA 2) w/Device KIT, Dispense 1 meter to use to test blood glucose once daily. Dx code: E11.42., Disp: 1 each, Rfl: 0 .  cephALEXin (KEFLEX) 500 MG capsule, TAKE 1 CAPSULE BY MOUTH EVERY 6 HOURS  FOR 7 DAYS, Disp: , Rfl:  .  gabapentin (NEURONTIN) 800 MG tablet, Take 1 tablet by mouth 4 times daily, Disp: 360 tablet, Rfl: 0 .  glucose blood (ONE TOUCH ULTRA TEST) test strip, Use as directed to test blood glucose up to three times daily. Dx code: E11.42., Disp: 100 each, Rfl: 12 .  hydrochlorothiazide (HYDRODIURIL) 25 MG tablet, Take 1 tablet (25 mg total) by mouth daily., Disp: 90 tablet, Rfl: 1 .  ibuprofen (ADVIL,MOTRIN) 800 MG tablet, Take 800 mg by mouth every 8 (eight) hours as needed. for pain, Disp: , Rfl:  .  Icosapent Ethyl (VASCEPA) 1 g CAPS, Take 2 capsules (2 g total) by mouth 2 (two) times daily. (Patient not taking: Reported on 01/04/2019), Disp: 120 capsule, Rfl: 3 .  Insulin Glargine (BASAGLAR KWIKPEN) 100 UNIT/ML SOPN, Inject 0.44 mLs (44 Units total) into the skin daily., Disp: 60 mL, Rfl: 1 .  Insulin Pen Needle (PEN NEEDLES) 32G X 5 MM MISC, 1 pen by Does not apply route daily. Use to inject insulin daily, Disp: 100 each, Rfl: 4 .  Lancets (ONETOUCH ULTRASOFT) lancets, Use as instructed to test blood glucose up to three times daily. Dx code: E11.42., Disp: 100 each, Rfl: 12 .  lisinopril (PRINIVIL,ZESTRIL) 40 MG tablet, Take 1 tablet (40 mg total) by mouth daily., Disp: 90 tablet, Rfl: 1 .  metFORMIN (GLUCOPHAGE) 1000 MG tablet, TAKE 1 TABLET BY MOUTH TWICE DAILY WITH A MEAL, Disp: 60 tablet, Rfl: 3 .  naproxen sodium (ALEVE) 220 MG tablet, Take 220 mg by mouth 2 (two) times daily., Disp: , Rfl:  .  nitroGLYCERIN (NITROSTAT) 0.4 MG SL tablet, Place 1 tablet (0.4 mg total) under the tongue every 5 (five) minutes as needed for chest pain., Disp: 25 tablet, Rfl: 6 .  predniSONE (DELTASONE) 20 MG tablet, Take 2 tablets (40 mg total) by mouth daily with breakfast., Disp: 10 tablet, Rfl: 0 .  sertraline (ZOLOFT) 100 MG tablet, TAKE 1 TABLET(100 MG) BY MOUTH DAILY, Disp: 90 tablet, Rfl: 1 .  tiZANidine (ZANAFLEX) 4 MG tablet, TAKE 1 TABLET BY MOUTH EVERY 8 HOURS AS NEEDED FOR  MUSCLE SPASM, Disp: 90 tablet, Rfl: 0  EXAM: This was a telehealth telephone visit and thus no physical exam was completed.   ASSESSMENT AND PLAN:  Discussed the following assessment and plan:  Essential (primary) hypertension Continue current medications.  Type 2 diabetes mellitus ([place]) Continue current medication.  Home CBGs well-controlled.  Chronic low back pain (Location of Primary Source of Pain) (Bilateral) (L>R) Chronic issue.  Stable.  She will see the surgeon as planned.  Seizure [facility]) This was  in the setting of sudden discontinuation of gabapentin which is for neuropathy.  No recurrence.  She will continue on gabapentin to treat neuropathy.  Social distancing precautions and sick precautions given regarding COVID-19.   I discussed the assessment and treatment plan with the patient. The patient was provided an opportunity to ask questions and all were answered. The patient agreed with the plan and demonstrated an understanding of the instructions.   The patient was advised to call back or seek an in-person evaluation if the symptoms worsen or if the condition fails to improve as anticipated.  I provided 13 minutes of non-face-to-face time during this encounter.   Tommi Rumps, MD

## 2019-02-10 NOTE — Telephone Encounter (Signed)
called pt per the provider and scheduled pt to do a virtual visit at 1 pm, pt agreed and understood.  Handsome Anglin,cma

## 2019-02-12 ENCOUNTER — Encounter: Payer: Self-pay | Admitting: Family Medicine

## 2019-02-12 NOTE — Assessment & Plan Note (Signed)
Chronic issue.  Stable.  She will see the surgeon as planned.

## 2019-02-12 NOTE — Assessment & Plan Note (Signed)
Continue current medication.  Home CBGs well-controlled.

## 2019-02-12 NOTE — Assessment & Plan Note (Signed)
Continue current medications. 

## 2019-02-12 NOTE — Assessment & Plan Note (Signed)
This was in the setting of sudden discontinuation of gabapentin which is for neuropathy.  No recurrence.  She will continue on gabapentin to treat neuropathy.

## 2019-02-13 ENCOUNTER — Ambulatory Visit: Payer: Medicare Other | Admitting: Family Medicine

## 2019-03-08 ENCOUNTER — Other Ambulatory Visit: Payer: Self-pay | Admitting: Family Medicine

## 2019-03-08 DIAGNOSIS — M7918 Myalgia, other site: Secondary | ICD-10-CM

## 2019-03-21 ENCOUNTER — Other Ambulatory Visit: Payer: Self-pay | Admitting: Family Medicine

## 2019-03-21 MED ORDER — ONETOUCH ULTRASOFT LANCETS MISC: 12 refills | Status: DC

## 2019-03-21 NOTE — Telephone Encounter (Signed)
Refill sent. Patient aware.  

## 2019-03-21 NOTE — Telephone Encounter (Addendum)
Pt needs a refill on lancets for one touch machine. Please sent to [facility] in [address]. Please call or text patient when rx has been sent to pharm. Pt needs lancet today. Pt has not been able to check her BS in over a week

## 2019-04-10 ENCOUNTER — Other Ambulatory Visit: Payer: Self-pay | Admitting: Family Medicine

## 2019-04-10 DIAGNOSIS — E785 Hyperlipidemia, unspecified: Secondary | ICD-10-CM

## 2019-04-10 DIAGNOSIS — M7918 Myalgia, other site: Secondary | ICD-10-CM

## 2019-04-10 NOTE — Telephone Encounter (Signed)
Please advise last refilled x 1 year ago

## 2019-04-10 NOTE — Telephone Encounter (Signed)
Patient would like a refill on her Blood Glucose Monitoring Suppl (ONE TOUCH ULTRA 2) w/Device KIT and test strips and have it sent to her preferred pharmacy [facility] on [address].

## 2019-04-11 NOTE — Telephone Encounter (Signed)
Please advise patient can pick up one from office or send script.

## 2019-04-12 ENCOUNTER — Ambulatory Visit: Payer: Medicare Other | Admitting: Family Medicine

## 2019-04-12 MED ORDER — GLUCOSE BLOOD VI STRP: ORAL_STRIP | 12 refills | Status: DC

## 2019-04-12 MED ORDER — ONETOUCH ULTRA 2 W/DEVICE KIT: PACK | 0 refills | Status: DC

## 2019-04-12 NOTE — Telephone Encounter (Signed)
Called patient and informed her that her Medication Banker) has arrived and is ready for pickup, It in the Fridge.  Nina,cma

## 2019-05-09 ENCOUNTER — Other Ambulatory Visit: Payer: Self-pay | Admitting: Family Medicine

## 2019-05-09 DIAGNOSIS — M7918 Myalgia, other site: Secondary | ICD-10-CM

## 2019-05-10 ENCOUNTER — Other Ambulatory Visit: Payer: Self-pay | Admitting: Family Medicine

## 2019-05-10 DIAGNOSIS — E785 Hyperlipidemia, unspecified: Secondary | ICD-10-CM

## 2019-05-10 DIAGNOSIS — M792 Neuralgia and neuritis, unspecified: Secondary | ICD-10-CM

## 2019-05-10 DIAGNOSIS — B0222 Postherpetic trigeminal neuralgia: Secondary | ICD-10-CM

## 2019-05-11 ENCOUNTER — Telehealth: Payer: Self-pay

## 2019-05-11 ENCOUNTER — Other Ambulatory Visit: Payer: Self-pay | Admitting: Family Medicine

## 2019-05-11 DIAGNOSIS — M7918 Myalgia, other site: Secondary | ICD-10-CM

## 2019-05-11 NOTE — Telephone Encounter (Signed)
Copied from [place] [id number]. Topic: Quick Communication - Rx Refill/Question >> May 11, 2019  9:32 AM Carolyn Stare wrote: Medication  pt said she will be out of this medication today     hydroCHLOROthiazide 25 MG Take 1 tablet by mouth once daily  Protocol Details  tiZANidine HCl 4 MG TAKE 1 TABLET BY MOUTH EVERY   Preferred Pharmacy  Agent: Please be advised that RX refills may take up to 3 business days. We ask that you follow-up with your pharmacy.

## 2019-05-12 NOTE — Telephone Encounter (Signed)
These medications were sent to the patients pharmacy, patient was notified by phone and she understood.  Dylann Layne,cma

## 2019-06-07 ENCOUNTER — Other Ambulatory Visit: Payer: Self-pay | Admitting: Family Medicine

## 2019-06-07 ENCOUNTER — Other Ambulatory Visit: Payer: Self-pay | Admitting: Internal Medicine

## 2019-06-07 DIAGNOSIS — M7918 Myalgia, other site: Secondary | ICD-10-CM

## 2019-06-07 DIAGNOSIS — E785 Hyperlipidemia, unspecified: Secondary | ICD-10-CM

## 2019-06-11 ENCOUNTER — Other Ambulatory Visit: Payer: Self-pay | Admitting: Family Medicine

## 2019-06-11 DIAGNOSIS — M7918 Myalgia, other site: Secondary | ICD-10-CM

## 2019-06-12 ENCOUNTER — Other Ambulatory Visit: Payer: Self-pay

## 2019-06-12 ENCOUNTER — Telehealth: Payer: Self-pay

## 2019-06-12 DIAGNOSIS — M7918 Myalgia, other site: Secondary | ICD-10-CM

## 2019-06-12 MED ORDER — TIZANIDINE HCL 4 MG PO TABS: ORAL_TABLET | ORAL | 0 refills | Status: DC

## 2019-06-12 NOTE — Telephone Encounter (Signed)
Sent to pharmacy 

## 2019-06-12 NOTE — Telephone Encounter (Signed)
Patient stated that she is in a lot of pain and need that medication ASAP.

## 2019-06-12 NOTE — Telephone Encounter (Signed)
Pt calling back to check status.

## 2019-06-12 NOTE — Telephone Encounter (Signed)
This patient is wanting her Zanaflex today. Apparently she is in a lot of pain. She has called five times today asking for an update. I deleted one request since there was multiple.  Please advise.

## 2019-06-12 NOTE — Telephone Encounter (Signed)
Patient is requesting her RX refill be sent to pharmacy . She has received her other refills but not this one    Medication tiZANidine (ZANAFLEX) 4 MG tablet     Please advise .. call number [phone]  [facility] [address] [place]  [place] [address]  Phone: [phone] Fax: [phone]

## 2019-06-12 NOTE — Telephone Encounter (Signed)
I have sent another message to Dr Caryl Bis. Closing this note since it is a duplicate.

## 2019-06-15 NOTE — Telephone Encounter (Signed)
Sent to pharmacy on 06/12/2019.  Tammy Acosta,cma

## 2019-06-19 ENCOUNTER — Other Ambulatory Visit: Payer: Self-pay

## 2019-06-21 ENCOUNTER — Ambulatory Visit (INDEPENDENT_AMBULATORY_CARE_PROVIDER_SITE_OTHER): Payer: Medicare Other | Admitting: Family Medicine

## 2019-06-21 ENCOUNTER — Telehealth: Payer: Self-pay | Admitting: Family Medicine

## 2019-06-21 ENCOUNTER — Other Ambulatory Visit: Payer: Self-pay

## 2019-06-21 ENCOUNTER — Encounter: Payer: Self-pay | Admitting: Family Medicine

## 2019-06-21 DIAGNOSIS — Z20828 Contact with and (suspected) exposure to other viral communicable diseases: Secondary | ICD-10-CM

## 2019-06-21 DIAGNOSIS — Z20822 Contact with and (suspected) exposure to covid-19: Secondary | ICD-10-CM

## 2019-06-21 DIAGNOSIS — R109 Unspecified abdominal pain: Secondary | ICD-10-CM | POA: Diagnosis not present

## 2019-06-21 DIAGNOSIS — G500 Trigeminal neuralgia: Secondary | ICD-10-CM

## 2019-06-21 MED ORDER — FLUTICASONE PROPIONATE 50 MCG/ACT NA SUSP: 2.0000 | Freq: Every day | NASAL | 6 refills | Status: DC

## 2019-06-21 MED ORDER — LORATADINE 10 MG PO TABS: 10.0000 mg | ORAL_TABLET | Freq: Every day | ORAL | 11 refills | Status: DC

## 2019-06-21 MED ORDER — OXCARBAZEPINE 300 MG PO TABS: 300.0000 mg | ORAL_TABLET | Freq: Two times a day (BID) | ORAL | 1 refills | Status: DC

## 2019-06-21 NOTE — Progress Notes (Signed)
Virtual Visit via telephone Note  This visit type was conducted due to national recommendations for restrictions regarding the COVID-19 pandemic (e.g. social distancing).  This format is felt to be most appropriate for this patient at this time.  All issues noted in this document were discussed and addressed.  No physical exam was performed (except for noted visual exam findings with Video Visits).   I connected with Tammy Acosta today at  2:45 PM EDT by telephone and verified that I am speaking with the correct person using two identifiers. Location patient: home Location provider: work  Persons participating in the virtual visit: patient, provider  I discussed the limitations, risks, security and privacy concerns of performing an evaluation and management service by telephone and the availability of in person appointments. I also discussed with the patient that there may be a patient responsible charge related to this service. The patient expressed understanding and agreed to proceed.  Interactive audio and video telecommunications were attempted between this provider and patient, however failed, due to patient having technical difficulties OR patient did not have access to video capability.  We continued and completed visit with audio only.   Reason for visit: follow-up  HPI: Cough: Patient notes over the last couple of weeks she has developed a cough that is sometimes deep and sometimes productive.  Some occasional wheezing.  She overall does not feel good.  She will occasionally note that it is hard to breathe though no significant shortness of breath.  Some mild headache with this.  Some sinus congestion as well.  She notes no COVID-19 exposure.  She has been trying to go out only when she has to.  Facial pain: Patient notes left side V2 discomfort.  There is no rash.  This has been going on intermittently over the last year.  Over the last 3 to 4 weeks it has become more persistent.   She notes her left cheek, ear, and tongue have been painful.  Notes the pain shoots in that area though she does have dull discomfort there most of the time.  Notes it is excruciating when it occurs.  She notes it is sensitive to touch at times.  She previously had the same symptoms on the right side of her face and was diagnosed with trigeminal neuralgia.  She had a gamma knife procedure at that time.  She was treated with medication prior to the gamma knife procedure.  Stomach issue: Patient notes at times it feels like there is a baby in her stomach that is kicking her.  She can see her abdomen move.  She does have bowel movements most days.  She did have diarrhea a while back though none recently.  No constipation.  No nausea or vomiting.  She does occasionally pass lots of gas.   ROS: See pertinent positives and negatives per HPI.  Past Medical History:  Diagnosis Date  . Altered mental status 02/04/2014  . Arthritis   . Colon polyps   . Depression   . Diabetes mellitus without complication ([place])    Pt states she takes Insulin and metformin.  Marland Kitchen Hypercholesteremia   . Hyperlipidemia   . Hypertension   . Shingles    10/17  . Spine disorder   . Viral upper respiratory illness 12/03/2015    Past Surgical History:  Procedure Laterality Date  . ABDOMINAL HYSTERECTOMY    . BACK SURGERY    . CHOLECYSTECTOMY    . KYPHOPLASTY  Family History  Problem Relation Age of Onset  . Heart disease Mother   . Hypertension Mother   . Cancer Mother        breast  . Breast cancer Mother        early 41's  . Heart disease Father   . Arthritis Other        Parent, other relative  . Colon cancer Other        Parent  . Lung cancer Other        Other relative  . Diabetes Other        Other relative  . Uterine cancer Other        Other relative  . Cancer Maternal Aunt        breast  . Breast cancer Maternal Aunt   . Cancer Maternal Aunt        ovarian  . Breast cancer Cousin   .  Breast cancer Cousin   . Breast cancer Cousin     SOCIAL HX: Smoker   Current Outpatient Medications:  .  amLODipine (NORVASC) 5 MG tablet, Take 1 tablet by mouth once daily, Disp: 90 tablet, Rfl: 0 .  atorvastatin (LIPITOR) 80 MG tablet, Take 1 tablet by mouth once daily, Disp: 30 tablet, Rfl: 2 .  blood glucose meter kit and supplies, Dispense One touch meter.  E11.9, Disp: 1 each, Rfl: 0 .  Blood Glucose Monitoring Suppl (ONE TOUCH ULTRA 2) w/Device KIT, Dispense 1 meter to use to test blood glucose once daily. Dx code: E11.42., Disp: 1 kit, Rfl: 0 .  cephALEXin (KEFLEX) 500 MG capsule, TAKE 1 CAPSULE BY MOUTH EVERY 6 HOURS FOR 7 DAYS, Disp: , Rfl:  .  gabapentin (NEURONTIN) 800 MG tablet, Take 1 tablet by mouth 4 times daily, Disp: 360 tablet, Rfl: 0 .  glucose blood (ONE TOUCH ULTRA TEST) test strip, Use as directed to test blood glucose up to three times daily. Dx code: E11.42., Disp: 100 each, Rfl: 12 .  hydrochlorothiazide (HYDRODIURIL) 25 MG tablet, Take 1 tablet by mouth once daily, Disp: 90 tablet, Rfl: 1 .  ibuprofen (ADVIL,MOTRIN) 800 MG tablet, Take 800 mg by mouth every 8 (eight) hours as needed. for pain, Disp: , Rfl:  .  Icosapent Ethyl (VASCEPA) 1 g CAPS, Take 2 capsules (2 g total) by mouth 2 (two) times daily., Disp: 120 capsule, Rfl: 3 .  Insulin Glargine (BASAGLAR KWIKPEN) 100 UNIT/ML SOPN, Inject 0.44 mLs (44 Units total) into the skin daily., Disp: 60 mL, Rfl: 1 .  Insulin Pen Needle (PEN NEEDLES) 32G X 5 MM MISC, 1 pen by Does not apply route daily. Use to inject insulin daily, Disp: 100 each, Rfl: 4 .  Lancets (ONETOUCH ULTRASOFT) lancets, Use as instructed to test blood glucose up to three times daily. Dx code: E11.42., Disp: 100 each, Rfl: 12 .  lisinopril (ZESTRIL) 40 MG tablet, Take 1 tablet by mouth once daily, Disp: 90 tablet, Rfl: 0 .  metFORMIN (GLUCOPHAGE) 1000 MG tablet, TAKE 1 TABLET BY MOUTH TWICE DAILY WITH A MEAL, Disp: 360 tablet, Rfl: 0 .  naproxen  sodium (ALEVE) 220 MG tablet, Take 220 mg by mouth 2 (two) times daily., Disp: , Rfl:  .  nitroGLYCERIN (NITROSTAT) 0.4 MG SL tablet, Place 1 tablet (0.4 mg total) under the tongue every 5 (five) minutes as needed for chest pain., Disp: 25 tablet, Rfl: 6 .  predniSONE (DELTASONE) 20 MG tablet, Take 2 tablets (40 mg total) by mouth  daily with breakfast., Disp: 10 tablet, Rfl: 0 .  sertraline (ZOLOFT) 100 MG tablet, Take 1 tablet by mouth once daily, Disp: 90 tablet, Rfl: 0 .  tiZANidine (ZANAFLEX) 4 MG tablet, TAKE 1 TABLET BY MOUTH EVERY 8 HOURS AS NEEDED FOR MUSCLE SPASM, Disp: 90 tablet, Rfl: 0 .  fluticasone (FLONASE) 50 MCG/ACT nasal spray, Place 2 sprays into both nostrils daily., Disp: 16 g, Rfl: 6 .  loratadine (CLARITIN) 10 MG tablet, Take 1 tablet (10 mg total) by mouth daily., Disp: 30 tablet, Rfl: 11 .  Oxcarbazepine (TRILEPTAL) 300 MG tablet, Take 1 tablet (300 mg total) by mouth 2 (two) times daily., Disp: 60 tablet, Rfl: 1  EXAM: This is a telehealth telephone visit and thus no physical exam was completed.   ASSESSMENT AND PLAN:  Discussed the following assessment and plan:  Trigeminal neuralgia Symptoms are consistent with trigeminal neuralgia.  They fit her prior symptoms on the right side.  Discussed that she would need an MRI to evaluate for an underlying lesion.  This will be ordered.  It will be scheduled once she is able to come off of quarantine.  We will treat her with Trileptal.  Discussed the risk of Stevens-Johnson syndrome with this and that if she develops any rash after starting this she needs to discontinue the medication and seek medical attention.  Discussed risk of seizure as well and if that occurs she needs to contact us immediately.  Suspected COVID-19 virus infection Discussed with the patient that her symptoms are concerning for COVID-19.  Discussed having her tested.  She was given testing center information.  I advised her on strict quarantine precautions  at least until we get the test results back and we release her.  Discussed Claritin and Flonase.  She will have somebody else pick that up for her and leave it at her door.  Discussed quarantine means not leaving the house except for seeking medical attention.  Discussed that she should not have anybody come visit her at the house as well.  She notes she will go to be tested on Friday given the incoming weather issues.  Stomach abnormality Patient without description of feeling like something is moving within her abdomen.  Discussed that we would need to complete an exam to determine the next step in management.  She will be scheduled for an office visit when she is able to come off of quarantine.    I discussed the assessment and treatment plan with the patient. The patient was provided an opportunity to ask questions and all were answered. The patient agreed with the plan and demonstrated an understanding of the instructions.   The patient was advised to call back or seek an in-person evaluation if the symptoms worsen or if the condition fails to improve as anticipated.  I provided 30 minutes of non-face-to-face time during this encounter.   Tommi Rumps, MD

## 2019-06-21 NOTE — Telephone Encounter (Signed)
Please call the patient and confirm whether or not she has a pacemaker.  Please see if she has any metal in her body.  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  I need to know this to order her MRI.  Thanks.

## 2019-06-21 NOTE — Telephone Encounter (Signed)
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 She wanted to remind you she is under [employer].  Jermain Curt,cma

## 2019-06-22 DIAGNOSIS — G500 Trigeminal neuralgia: Secondary | ICD-10-CM | POA: Insufficient documentation

## 2019-06-22 DIAGNOSIS — Z20822 Contact with and (suspected) exposure to covid-19: Secondary | ICD-10-CM | POA: Insufficient documentation

## 2019-06-22 DIAGNOSIS — R109 Unspecified abdominal pain: Secondary | ICD-10-CM | POA: Insufficient documentation

## 2019-06-22 NOTE — Assessment & Plan Note (Signed)
Patient without description of feeling like something is moving within her abdomen.  Discussed that we would need to complete an exam to determine the next step in management.  She will be scheduled for an office visit when she is able to come off of quarantine.

## 2019-06-22 NOTE — Assessment & Plan Note (Signed)
Discussed with the patient that her symptoms are concerning for COVID-19.  Discussed having her tested.  She was given testing center information.  I advised her on strict quarantine precautions at least until we get the test results back and we release her.  Discussed Claritin and Flonase.  She will have somebody else pick that up for her and leave it at her door.  Discussed quarantine means not leaving the house except for seeking medical attention.  Discussed that she should not have anybody come visit her at the house as well.  She notes she will go to be tested on Friday given the incoming weather issues.

## 2019-06-22 NOTE — Telephone Encounter (Signed)
Noted. I plan to order the MRI so it can be in the process of getting scheduled.

## 2019-06-22 NOTE — Assessment & Plan Note (Signed)
Symptoms are consistent with trigeminal neuralgia.  They fit her prior symptoms on the right side.  Discussed that she would need an MRI to evaluate for an underlying lesion.  This will be ordered.  It will be scheduled once she is able to come off of quarantine.  We will treat her with Trileptal.  Discussed the risk of Stevens-Johnson syndrome with this and that if she develops any rash after starting this she needs to discontinue the medication and seek medical attention.  Discussed risk of seizure as well and if that occurs she needs to contact us immediately.

## 2019-06-23 ENCOUNTER — Other Ambulatory Visit: Payer: Self-pay

## 2019-06-23 DIAGNOSIS — Z20822 Contact with and (suspected) exposure to covid-19: Secondary | ICD-10-CM

## 2019-06-25 LAB — NOVEL CORONAVIRUS, NAA: SARS-CoV-2, NAA: NOT DETECTED

## 2019-07-04 ENCOUNTER — Telehealth: Payer: Self-pay

## 2019-07-04 NOTE — Telephone Encounter (Signed)
PAP meds received

## 2019-07-08 ENCOUNTER — Other Ambulatory Visit: Payer: Self-pay | Admitting: Family Medicine

## 2019-07-08 DIAGNOSIS — M7918 Myalgia, other site: Secondary | ICD-10-CM

## 2019-07-11 ENCOUNTER — Ambulatory Visit: Payer: Medicare Other

## 2019-07-31 ENCOUNTER — Telehealth: Payer: Self-pay

## 2019-07-31 ENCOUNTER — Telehealth: Payer: Self-pay | Admitting: Family Medicine

## 2019-07-31 DIAGNOSIS — E1142 Type 2 diabetes mellitus with diabetic polyneuropathy: Secondary | ICD-10-CM

## 2019-07-31 DIAGNOSIS — I10 Essential (primary) hypertension: Secondary | ICD-10-CM

## 2019-07-31 NOTE — Addendum Note (Signed)
Addended by: De Hollingshead on: 07/31/2019 03:04 PM   Modules accepted: Orders

## 2019-07-31 NOTE — Telephone Encounter (Signed)
Left detailed message informing patient that someone from Catie's team should be reaching out to her. If she had questions for [place] she could call us back.

## 2019-07-31 NOTE — Telephone Encounter (Signed)
To work together for re-application, will need to work through [place] Management. I have placed referral for CCM; someone from my team will outreach patient to discuss consent and scheduling a phone call from me.   Judson Roch, could you call and let her know that me or someone from my team will be calling her? Thanks!

## 2019-07-31 NOTE — Telephone Encounter (Signed)
Copied from [place] [phone]. Topic: General - Other >> Jul 31, 2019  1:44 PM Celene Kras wrote: Reason for CRM: Pt called and is requesting to have a call back from [facility] regarding here insulin approval to lily. Please advise.

## 2019-07-31 NOTE — Chronic Care Management (AMB) (Signed)
  Chronic Care Management   Note  07/31/2019 Name: Tammy Acosta MRN: [id number] DOB: 1956/05/21  [name] is a 63 y.o. year old female who is a primary care patient of Caryl Bis, Angela Adam, MD. I reached out to [name] by phone today in response to a referral sent by [name]'s PCP, Dr. Tommi Rumps     [name] was given information about Chronic Care Management services today including:  1. CCM service includes personalized support from designated clinical staff supervised by her physician, including individualized plan of care and coordination with other care providers 2. 24/7 contact phone numbers for assistance for urgent and routine care needs. 3. Service will only be billed when office clinical staff spend 20 minutes or more in a month to coordinate care. 4. Only one practitioner may furnish and bill the service in a calendar month. 5. The patient may stop CCM services at any time (effective at the end of the month) by phone call to the office staff. 6. The patient will be responsible for cost sharing (co-pay) of up to 20% of the service fee (after annual deductible is met).  Patient did not agree to services and wishes to consider information provided before deciding about enrollment in care management services.   Follow up plan: The patient has been provided with contact information for the chronic care management team and has been advised to call with any health related questions or concerns.   SIGNATURE

## 2019-08-01 ENCOUNTER — Ambulatory Visit: Payer: Self-pay | Admitting: Pharmacist

## 2019-08-01 NOTE — Chronic Care Management (AMB) (Signed)
  Chronic Care Management   Note  08/01/2019 Name: Tammy Acosta MRN: [id number] DOB: 1956-07-26  [name] is a 63 y.o. year old female who is a primary care patient of Caryl Bis, Angela Adam, MD. The CCM team was consulted for assistance with chronic disease management and care coordination needs.   Patient contacted clinic yesterday to talk to me regarding re-applying for [name] assistance. I placed referral for CCM services for medication access and medication management on behalf of Dr. Caryl Bis. Patient was contacted by my care guide to consent to CCM services; she declined due to the $20/month copay with her insurance.   I contacted her today to further explain CCM services. She again declined CCM services. Patient can separately and independently re-apply for [employer] assistance for 2021. However, she is [facility]-eligible, and may be able to receive support from an alternative [facility] pharmacist.   Follow up plan: - Will collaborate w/ my supervisor to determine next steps and options for supporting this patient in her care.   Catie Darnelle Maffucci, PharmD, [place], CPP Clinical Pharmacist [place] [phone]

## 2019-08-02 ENCOUNTER — Other Ambulatory Visit: Payer: Self-pay | Admitting: Pharmacist

## 2019-08-02 NOTE — Patient Outreach (Signed)
[facility]) Care Management  [place]   08/02/2019  NOOR WITTE 01/02/1956 [id number]  Subjective:  Patient was referred to Care Management by Catie, PharmD with clinic for medication patient assistance evaluation for Basaglar.   HIPAA compliant voicemail left for patient who promptly returned call, HIPAA details verified, and member agreed to call.   Member reports being able to review her medications and needing to re-apply for medication assistance through [facility] for [employer].   Objective:   Current Medications: Current Outpatient Medications  Medication Sig Dispense Refill  . amLODipine (NORVASC) 5 MG tablet Take 1 tablet by mouth once daily 90 tablet 0  . atorvastatin (LIPITOR) 80 MG tablet Take 1 tablet by mouth once daily 30 tablet 2  . fluticasone (FLONASE) 50 MCG/ACT nasal spray Place 2 sprays into both nostrils daily. 16 g 6  . gabapentin (NEURONTIN) 800 MG tablet Take 1 tablet by mouth 4 times daily 360 tablet 0  . hydrochlorothiazide (HYDRODIURIL) 25 MG tablet Take 1 tablet by mouth once daily 90 tablet 1  . Insulin Glargine (BASAGLAR KWIKPEN) 100 UNIT/ML SOPN Inject 0.44 mLs (44 Units total) into the skin daily. 60 mL 1  . lisinopril (ZESTRIL) 40 MG tablet Take 1 tablet by mouth once daily 90 tablet 0  . loratadine (CLARITIN) 10 MG tablet Take 1 tablet (10 mg total) by mouth daily. 30 tablet 11  . metFORMIN (GLUCOPHAGE) 1000 MG tablet TAKE 1 TABLET BY MOUTH TWICE DAILY WITH A MEAL 360 tablet 0  . Oxcarbazepine (TRILEPTAL) 300 MG tablet Take 1 tablet (300 mg total) by mouth 2 (two) times daily. 60 tablet 1  . sertraline (ZOLOFT) 100 MG tablet Take 1 tablet by mouth once daily 90 tablet 0  . blood glucose meter kit and supplies Dispense One touch meter.  E11.9 1 each 0  . Blood Glucose Monitoring Suppl (ONE TOUCH ULTRA 2) w/Device KIT Dispense 1 meter to use to test blood glucose once daily. Dx code: E11.42. 1 kit 0  . glucose blood  (ONE TOUCH ULTRA TEST) test strip Use as directed to test blood glucose up to three times daily. Dx code: E11.42. 100 each 12  . ibuprofen (ADVIL,MOTRIN) 800 MG tablet Take 800 mg by mouth every 8 (eight) hours as needed. for pain    . Icosapent Ethyl (VASCEPA) 1 g CAPS Take 2 capsules (2 g total) by mouth 2 (two) times daily. (Patient not taking: Reported on 08/02/2019) 120 capsule 3  . Insulin Pen Needle (PEN NEEDLES) 32G X 5 MM MISC 1 pen by Does not apply route daily. Use to inject insulin daily 100 each 4  . Lancets (ONETOUCH ULTRASOFT) lancets Use as instructed to test blood glucose up to three times daily. Dx code: E11.42. 100 each 12  . naproxen sodium (ALEVE) 220 MG tablet Take 220 mg by mouth 2 (two) times daily.    . nitroGLYCERIN (NITROSTAT) 0.4 MG SL tablet Place 1 tablet (0.4 mg total) under the tongue every 5 (five) minutes as needed for chest pain. (Patient not taking: Reported on 08/02/2019) 25 tablet 6  . predniSONE (DELTASONE) 20 MG tablet Take 2 tablets (40 mg total) by mouth daily with breakfast. (Patient not taking: Reported on 08/02/2019) 10 tablet 0  . tiZANidine (ZANAFLEX) 4 MG tablet TAKE 1 TABLET BY MOUTH EVERY 8 HOURS AS NEEDED FOR MUSCLE SPASM 90 tablet 0   No current facility-administered medications for this visit.     Assessment:  1) Medication reconciliation  completed per patient report. Patient reports not using cephalexin or prednisone any more.   Patient confirms using ibuprofen or naproxen as needed---counseled patient to not take these medications together as they are similar agents. She reports her provider has already discussed not using often due to effect on kidney function.    Vascepa---patient reports she did not start due to cost.   Nitroglycerin sublingual---patient reports she does not have, and prescription appears to be expired in this chart.   Basaglar---patient needs to re-apply for [employer] patient assistance. Patient reports difficulty with  mail at her residence and prefers to pick up application at Dr Ellen Henri office.   Plan:  Spoke with Catie, PharmD at provider office who will leave application for patient to pick up. Patient aware of need to provide proof of income.   Pharmacy Technician at [employer] can assist with application, patient understands no guarantee she will be approved.   Will message Dr Caryl Bis regarding Vascepa---patient may be able to get cost assistance if medication is required.  Karrie Meres, PharmD, [place] [phone]

## 2019-08-03 ENCOUNTER — Other Ambulatory Visit: Payer: Self-pay | Admitting: Pharmacy Technician

## 2019-08-03 NOTE — Patient Outreach (Signed)
[facility] [facility]) Care Management  08/03/2019  Tammy Acosta July 22, 1956 [id number]                                        Medication Assistance Referral  Referral From: [place]: Nancee Liter / Ralph Leyden Patient application portion:  N/A patient to pick up from the office of Dr. Tommi Rumps Provider application portion: Faxed  to Dr. Tommi Rumps Provider address/fax verified via: Call to office  This is for 2021 patient assistance   Follow up:  Will follow up with patient in 20-30 business days to confirm application(s) have been received.  Nico Rogness P. Rhemi Balbach, [place] Management [phone]

## 2019-08-07 ENCOUNTER — Other Ambulatory Visit: Payer: Self-pay | Admitting: Family Medicine

## 2019-08-07 DIAGNOSIS — E785 Hyperlipidemia, unspecified: Secondary | ICD-10-CM

## 2019-08-07 DIAGNOSIS — M792 Neuralgia and neuritis, unspecified: Secondary | ICD-10-CM

## 2019-08-07 DIAGNOSIS — B0222 Postherpetic trigeminal neuralgia: Secondary | ICD-10-CM

## 2019-08-09 ENCOUNTER — Other Ambulatory Visit: Payer: Self-pay | Admitting: Pharmacist

## 2019-08-09 ENCOUNTER — Telehealth: Payer: Self-pay | Admitting: Family Medicine

## 2019-08-09 DIAGNOSIS — M7918 Myalgia, other site: Secondary | ICD-10-CM

## 2019-08-09 NOTE — Telephone Encounter (Signed)
Pt called about a refill for metFORMIN (GLUCOPHAGE) 1000 MG tablet and tiZANidine (ZANAFLEX) 4 MG tablet Pt is out of them.  Pharmacy is [facility], [place]  Call pt @ [phone]

## 2019-08-09 NOTE — Patient Outreach (Signed)
[place] [facility]) Care Management  08/09/2019  Tammy Acosta 05/07/1956 [id number]  Member left a voicemail requesting a call back. Successful phone call placed to patient, HIPAA details verified by member.   Member was advised [employer] patient assistance forms are at Dr [employer] office front desk for her. Member was counseled to not go to office if she has symptoms of COVID. She verbalized understanding. Member was counseled proof of income will be required for application.  Once she completes her portion of application, she can leave at provider's office and Catie, PharmD will be able to get application to our pharmacy technician to take over the application process.   Plan:  Member advised to call with questions/concerns.   Karrie Meres, PharmD, [place] [phone]

## 2019-08-10 ENCOUNTER — Telehealth: Payer: Self-pay | Admitting: Family Medicine

## 2019-08-10 MED ORDER — METFORMIN HCL 1000 MG PO TABS: ORAL_TABLET | ORAL | 2 refills | Status: DC

## 2019-08-10 MED ORDER — TIZANIDINE HCL 4 MG PO TABS: ORAL_TABLET | ORAL | 0 refills | Status: DC

## 2019-08-10 NOTE — Addendum Note (Signed)
Addended by: Leone Haven on: 08/10/2019 01:39 PM   Modules accepted: Orders

## 2019-08-10 NOTE — Telephone Encounter (Signed)
Pt needs a refill on tiZANidine (ZANAFLEX) 4 MG tablet sent to [facility] on [address].   Aleksander Edmiston,cma

## 2019-08-10 NOTE — Telephone Encounter (Signed)
Pt needs a refill on tiZANidine (ZANAFLEX) 4 MG tablet sent to [facility] on [address]

## 2019-08-10 NOTE — Telephone Encounter (Signed)
Sent to pharmacy 

## 2019-08-12 ENCOUNTER — Other Ambulatory Visit: Payer: Self-pay | Admitting: Family Medicine

## 2019-08-23 ENCOUNTER — Other Ambulatory Visit: Payer: Self-pay | Admitting: Pharmacist

## 2019-08-23 NOTE — Patient Outreach (Signed)
[place] [facility]) Care Management  08/23/2019  Tammy Acosta 02-04-1956 [id number]   Message received back that Dr Caryl Bis would like member to take Vascepa if affordable.   Call placed to member today, to follow up on potential medication access resources. Member reported not feeling well today and prefers a call back next week.     Plan:  Attempt member outreach next week.   Karrie Meres, PharmD, [place] [phone]

## 2019-08-29 ENCOUNTER — Other Ambulatory Visit: Payer: Self-pay | Admitting: Pharmacy Technician

## 2019-08-29 NOTE — Patient Outreach (Signed)
[facility]) Care Management  08/29/2019  ADALIN VANDERPLOEG Jan 29, 1956 [id number]    Received both patient and provider portion(s) of patient assistance application(s) for Basaglar. Faxed completed application and required documents into [name].  Will follow up with company(ies) in 7-10 business days to check status of application(s).  Honorio Devol P. Olander Friedl, CPhT [place] [facility] [phone]

## 2019-09-01 ENCOUNTER — Other Ambulatory Visit: Payer: Self-pay | Admitting: Pharmacist

## 2019-09-01 NOTE — Patient Outreach (Signed)
[facility]) Care Management  09/01/2019  Tammy Acosta 06/12/1956 [id number]  Received voicemail from member.    Successful follow-up call to member, HIPAA details verified.   Member reports she has about seven days of insulin left. Per review of chart, [employer] application was faxed this week and pharmacy technician has planned follow-up next week.   Discussed with member her Medicare Advantage plan appears to be partaking in the Insulin Senior Savings Program, $35 co-pay for 30 day supply of insulin---she reports that will be too expensive.   If application is not approved next week, will plan to make outreach to [facility] to see if member can get any temporary assistance.   Also discussed with member Dr Purvis Sheffield recommendation to start Vascepa if we can find cost assistance. [employer] is an option for member. After discussing application process, member wishes to discuss with her provider and have her labs repeated before starting. We discussed Vascepa can be used to help lower triglycerides.   Plan:  Will follow-up with patient next week regarding her insulin.   Tommye Standard, PharmD, [facility] Clinical Pharmacist [facility] [phone]

## 2019-09-02 ENCOUNTER — Other Ambulatory Visit: Payer: Self-pay | Admitting: Family Medicine

## 2019-09-06 ENCOUNTER — Other Ambulatory Visit: Payer: Self-pay | Admitting: Pharmacy Technician

## 2019-09-06 ENCOUNTER — Other Ambulatory Visit: Payer: Self-pay | Admitting: Family Medicine

## 2019-09-06 DIAGNOSIS — M7918 Myalgia, other site: Secondary | ICD-10-CM

## 2019-09-06 NOTE — Patient Outreach (Signed)
[facility]) Care Management  09/06/2019  Tammy Acosta 07/12/1956 [id number]  Care coordination call placed to [name] in regards to patient's application for Basaglar.  Spoke to [name] who informed patient has been APPROVED 09/03/2019-08/23/2020. She informed an order was placed with [facility] pharmacy on the 10th. She informed is patient has not heard from them by the end of the week then she should contact them to set up her delivery.  Will outreach patient with this information.  [name] P. Cynia Abruzzo, CPhT [facility] Management [phone]

## 2019-09-06 NOTE — Patient Outreach (Signed)
[facility]) Care Management  09/06/2019  Tammy Acosta September 07, 1955 [id number]   Successful call placed to patient regarding patient assistance application(s) for Basaglar with [name] , HIPAA identifiers verified.   Informed patient that she was approved for the program. Patient informed she already knew as she received a text message. Informed patient the refill order was placed with the pharmacy, [facility], on the 10th and they should be outreaching her to set up delivery. Informed her to call by Friday if she had not heard from them.  Patient informed she was going to call [facility] either today or tomorrow as she was running low on medication. Patient confirmed having [facility] as well as [employer] numbers.Patient informed she was comfortable with the refill process as she performed the process last year.  Patient informed she had no other questions or concerns. Patient confirmed having our name and number.   Follow up:  Will route note to [facility] RPh Tommye Standard for case closure and will remove myself from care team as patient assistance has been completed.  Konstance Happel P. Rilley Stash, CPhT [facility] Care Management [phone]

## 2019-09-08 ENCOUNTER — Other Ambulatory Visit: Payer: Self-pay | Admitting: Pharmacist

## 2019-09-08 ENCOUNTER — Telehealth: Payer: Self-pay

## 2019-09-08 NOTE — Telephone Encounter (Signed)
I called and informed the patient that her medication Basglar has arrived and she can pick up at the front desk, I informed her to ask for [name] and I will bring her medication out to her.  She understood.  Shirin Echeverry,cma

## 2019-09-08 NOTE — Patient Outreach (Signed)
[facility]) Care Management  09/08/2019  MACKENSI MAHADEO [birth year] [id number]  Outreach call to member, HIPAA details verified. Member reports she received call from her primary care provider's office her Basilar (insulin glairin) is at provider office for pick-up. Member reports she plans to go next week to pick-up.   Member was updated this pharmacist messaged provider regarding vascepa and provider will follow-up with member at next scheduled appointment, currently March 2021.   Plan:  Will close this case at this time.   Member aware she can contact pharmacist if she has new needs.   Tommye Standard, PharmD, [facility] Clinical Pharmacist [facility] [phone]

## 2019-09-11 ENCOUNTER — Telehealth: Payer: Self-pay

## 2019-09-11 NOTE — Telephone Encounter (Signed)
t came in and picked up her medication Magazine features editor) from [facility] today.  Roslyn Else,cma

## 2019-09-13 ENCOUNTER — Telehealth: Payer: Self-pay | Admitting: Family Medicine

## 2019-09-13 ENCOUNTER — Telehealth: Payer: Self-pay

## 2019-09-13 ENCOUNTER — Other Ambulatory Visit: Payer: Self-pay | Admitting: Family Medicine

## 2019-09-13 MED ORDER — ONETOUCH ULTRASOFT LANCETS MISC: 0 refills | Status: DC

## 2019-09-13 NOTE — Telephone Encounter (Signed)
I sent the lancets and informed the patient if they are not correct call and let me know.  Alonda Weaber,cma

## 2019-09-13 NOTE — Telephone Encounter (Signed)
Patinet needs a refill on her lancets, she received her stripes.

## 2019-09-13 NOTE — Telephone Encounter (Signed)
I called and spoke with the patient and she needed lancets I sent the lancets from the med list and she stated it should be ultra delicaplus I informed her I sent it with no refills and if they do not work call and let me know.  Patient agreed.   Callen Vancuren,cma

## 2019-09-15 ENCOUNTER — Telehealth: Payer: Self-pay | Admitting: Family Medicine

## 2019-09-15 NOTE — Telephone Encounter (Signed)
Pt states that the Lancets (ONETOUCH ULTRASOFT) lancets do not fit her meter. Please call back

## 2019-09-15 NOTE — Telephone Encounter (Signed)
I refilled her lancets earlier this week and she stated that they were to big for her machine the name of her machine is ultra touch ultra 2, can you please order these I did not see them.  Tammy Acosta,cma

## 2019-09-17 NOTE — Telephone Encounter (Signed)
Please call her and clarify. The lancets should not have anything to do with the machine. Please see if she means strips?

## 2019-09-19 ENCOUNTER — Telehealth: Payer: Self-pay | Admitting: Family Medicine

## 2019-09-19 ENCOUNTER — Other Ambulatory Visit: Payer: Self-pay

## 2019-09-19 MED ORDER — ONETOUCH ULTRASOFT LANCETS MISC: 1 refills | Status: DC

## 2019-09-19 NOTE — Telephone Encounter (Signed)
Lvm, called the patient to informed her that the RX for lancets was sent to [facility].  Kimble Hitchens,cma

## 2019-09-19 NOTE — Telephone Encounter (Signed)
Error

## 2019-09-21 NOTE — Telephone Encounter (Signed)
I called the patient and informed her that I could give her a machine from this office for her to use the lancets she received from the pharmacy and she stated she thinks she has enough until 10/08/2019 and if she does not she will call and let me know.  Jamire Shabazz,cma

## 2019-09-21 NOTE — Telephone Encounter (Signed)
Pt called in and said her insurance won't pay for the new lancets since she got the other ones that was incorrect until 10/08/2019. She wants to know what should do and asking for a callback?

## 2019-10-08 ENCOUNTER — Other Ambulatory Visit: Payer: Self-pay | Admitting: Internal Medicine

## 2019-10-08 ENCOUNTER — Other Ambulatory Visit: Payer: Self-pay | Admitting: Family Medicine

## 2019-10-08 DIAGNOSIS — E785 Hyperlipidemia, unspecified: Secondary | ICD-10-CM

## 2019-10-08 DIAGNOSIS — M7918 Myalgia, other site: Secondary | ICD-10-CM

## 2019-10-16 ENCOUNTER — Other Ambulatory Visit: Payer: Self-pay | Admitting: Family Medicine

## 2019-10-31 ENCOUNTER — Telehealth: Payer: Self-pay | Admitting: Family Medicine

## 2019-10-31 NOTE — Telephone Encounter (Signed)
Pt cannot make appt with PCP on 3/15 due to transportation issues.  Pt would like to reschedule  Please advise Call back # [phone]

## 2019-11-03 ENCOUNTER — Other Ambulatory Visit: Payer: Self-pay

## 2019-11-03 ENCOUNTER — Ambulatory Visit (INDEPENDENT_AMBULATORY_CARE_PROVIDER_SITE_OTHER): Payer: Medicare Other

## 2019-11-03 VITALS — Ht 62.0 in | Wt 162.0 lb

## 2019-11-03 DIAGNOSIS — Z0000 Encounter for general adult medical examination without abnormal findings: Secondary | ICD-10-CM

## 2019-11-03 NOTE — Progress Notes (Signed)
Subjective:   Tammy Acosta is a 64 y.o. female who presents for Medicare Annual (Subsequent) preventive examination.  Review of Systems:  No ROS.  Medicare Wellness Virtual Visit.  Visual/audio telehealth visit, UTA vital signs.  Ht/Wt provided.  See social history for additional risk factors.   Cardiac Risk Factors include: advanced age (>48mn, >>68women);diabetes mellitus;hypertension     Objective:     Vitals: Ht _0  (1.575 m)   Wt 162 lb (73.5 kg)   BMI 29.63 kg/m   Body mass index is 29.63 kg/m.  Advanced Directives 11/03/2019 11/02/2018 11/06/2016 09/24/2016 06/24/2016 03/30/2016 01/15/2016  Does Patient Have a Medical Advance Directive? _1  No No  Does patient want to make changes to medical advance directive? - No - Patient declined - - - - -  Would patient like information on creating a medical advance directive? No - Patient declined - Yes (MAU/Ambulatory/Procedural Areas - Information given) No - Patient declined - No - patient declined information -    Tobacco Social History   Tobacco Use  Smoking Status Former Smoker  . Packs/day: 0.00  . Years: 40.00  . Pack years: 0.00  . Types: Cigarettes  Smokeless Tobacco Never Used     Counseling given: Not Answered   Clinical Intake:  Pre-visit preparation completed: Yes        Diabetes: Yes(Followed by pcp)  How often do you need to have someone help you when you read instructions, pamphlets, or other written materials from your doctor or pharmacy?: 1 - Never  Interpreter Needed?: No     Past Medical History:  Diagnosis Date  . Altered mental status 02/04/2014  . Arthritis   . Colon polyps   . Depression   . Diabetes mellitus without complication ([place]    Pt states she takes Insulin and metformin.  .Marland KitchenHypercholesteremia   . Hyperlipidemia   . Hypertension   . Shingles    10/17  . Spine disorder   . Viral upper respiratory illness 12/03/2015   Past Surgical History:  Procedure  Laterality Date  . ABDOMINAL HYSTERECTOMY    . BACK SURGERY    . CHOLECYSTECTOMY    . KYPHOPLASTY     Family History  Problem Relation Age of Onset  . Heart disease Mother   . Hypertension Mother   . Cancer Mother        breast  . Breast cancer Mother        early 472's . Heart disease Father   . Arthritis Other        Parent, other relative  . Colon cancer Other        Parent  . Lung cancer Other        Other relative  . Diabetes Other        Other relative  . Uterine cancer Other        Other relative  . Cancer Maternal Aunt        breast  . Breast cancer Maternal Aunt   . Cancer Maternal Aunt        ovarian  . Breast cancer Cousin   . Breast cancer Cousin   . Breast cancer Cousin    Social History   Socioeconomic History  . Marital status: Divorced    Spouse name: Not on file  . Number of children: Not on file  . Years of education: Not on file  . Highest education level: Not on file  Occupational History  . Not on file  Tobacco Use  . Smoking status: Former Smoker    Packs/day: 0.00    Years: 40.00    Pack years: 0.00    Types: Cigarettes  . Smokeless tobacco: Never Used  Substance and Sexual Activity  . Alcohol use: No  . Drug use: No  . Sexual activity: Never  Other Topics Concern  . Not on file  Social History Narrative  . Not on file   Social Determinants of Health   Financial Resource Strain:   . Difficulty of Paying Living Expenses:   Food Insecurity:   . Worried About Charity fundraiser in the Last Year:   . Arboriculturist in the Last Year:   Transportation Needs:   . Film/video editor (Medical):   Marland Kitchen Lack of Transportation (Non-Medical):   Physical Activity:   . Days of Exercise per Week:   . Minutes of Exercise per Session:   Stress:   . Feeling of Stress :   Social Connections:   . Frequency of Communication with Friends and Family:   . Frequency of Social Gatherings with Friends and Family:   . Attends Religious  Services:   . Active Member of Clubs or Organizations:   . Attends Archivist Meetings:   Marland Kitchen Marital Status:     Outpatient Encounter Medications as of 11/03/2019  Medication Sig  . amLODipine (NORVASC) 5 MG tablet Take 1 tablet by mouth once daily  . atorvastatin (LIPITOR) 80 MG tablet Take 1 tablet by mouth once daily  . blood glucose meter kit and supplies Dispense One touch meter.  E11.9  . Blood Glucose Monitoring Suppl (ONE TOUCH ULTRA 2) w/Device KIT Dispense 1 meter to use to test blood glucose once daily. Dx code: E11.42.  . fluticasone (FLONASE) 50 MCG/ACT nasal spray Place 2 sprays into both nostrils daily.  Marland Kitchen gabapentin (NEURONTIN) 800 MG tablet Take 1 tablet by mouth 4 times daily  . glucose blood (ONE TOUCH ULTRA TEST) test strip Use as directed to test blood glucose up to three times daily. Dx code: E11.42.  . hydrochlorothiazide (HYDRODIURIL) 25 MG tablet Take 1 tablet by mouth once daily  . ibuprofen (ADVIL,MOTRIN) 800 MG tablet Take 800 mg by mouth every 8 (eight) hours as needed. for pain  . Icosapent Ethyl (VASCEPA) 1 g CAPS Take 2 capsules (2 g total) by mouth 2 (two) times daily.  . Insulin Glargine (BASAGLAR KWIKPEN) 100 UNIT/ML SOPN Inject 0.44 mLs (44 Units total) into the skin daily.  . Insulin Pen Needle (PEN NEEDLES) 32G X 5 MM MISC 1 pen by Does not apply route daily. Use to inject insulin daily  . Lancets (ONETOUCH ULTRASOFT) lancets Use as instructed to test blood glucose up to three times daily. Dx code: E11.42.  Marland Kitchen lisinopril (ZESTRIL) 40 MG tablet Take 1 tablet by mouth once daily  . loratadine (CLARITIN) 10 MG tablet Take 1 tablet (10 mg total) by mouth daily.  . metFORMIN (GLUCOPHAGE) 1000 MG tablet TAKE 1 TABLET BY MOUTH TWICE DAILY WITH A MEAL  . naproxen sodium (ALEVE) 220 MG tablet Take 220 mg by mouth 2 (two) times daily.  . nitroGLYCERIN (NITROSTAT) 0.4 MG SL tablet Place 1 tablet (0.4 mg total) under the tongue every 5 (five) minutes as  needed for chest pain.  . Oxcarbazepine (TRILEPTAL) 300 MG tablet Take 1 tablet by mouth twice daily  . predniSONE (DELTASONE) 20 MG tablet Take 2 tablets (  40 mg total) by mouth daily with breakfast.  . sertraline (ZOLOFT) 100 MG tablet Take 1 tablet by mouth once daily  . tiZANidine (ZANAFLEX) 4 MG tablet TAKE 1 TABLET BY MOUTH EVERY 8 HOURS AS NEEDED FOR MUSCLE SPASM   No facility-administered encounter medications on file as of 11/03/2019.    Activities of Daily Living In your present state of health, do you have any difficulty performing the following activities: 11/03/2019  Hearing? N  Vision? N  Difficulty concentrating or making decisions? N  Walking or climbing stairs? Y  Dressing or bathing? N  Doing errands, shopping? N  Preparing Food and eating ? N  Using the Toilet? N  In the past six months, have you accidently leaked urine? N  Do you have problems with loss of bowel control? N  Managing your Medications? N  Managing your Finances? N  Housekeeping or managing your Housekeeping? N  Some recent data might be hidden    Patient Care Team: Leone Haven, MD as PCP - General (Family Medicine)    Assessment:   This is a routine wellness examination for Zalma.  Nurse connected with patient 11/03/19 at  1:00 PM EST by a telephone enabled telemedicine application and verified that I am speaking with the correct person using two identifiers. Patient stated full name and DOB. Patient gave permission to continue with virtual visit. Patient's location was at home and Nurse's location was at [place] office.   Patient is alert and oriented x3. Patient denies difficulty focusing or concentrating. Patient likes to read and play games on her phone for brain stimulation.   Health Maintenance Due: -Pap Smear- plans to receive at next physical -Colonoscopy- deferred per patient preference -Eye Exam- plans to schedule -HIV screening- labs followed by pcp-schedule at [facility] -Foot  Exam- followed by pcp. Denies wounds or any new changes.  -Hgb A1c- 11/02/18 (7.9) See completed HM at the end of note.   Eye: Visual acuity not assessed. Virtual visit. Followed by their ophthalmologist.  Retinopathy- none reported.  Dental: Dentures- yes, top only  Hearing: Demonstrates normal hearing during visit.  Safety:  Patient feels safe at home- yes Patient does have smoke detectors at home- yes Patient does wear sunscreen or protective clothing when in direct sunlight - yes Patient does wear seat belt when in a moving vehicle - yes Patient drives- yes Adequate lighting in walkways free from debris- yes Grab bars and handrails used as appropriate- yes Ambulates with an assistive device- no Cell phone on person when ambulating outside of the home- yes  Social: Alcohol intake - no    Smoking history- no Illicit drug use? none  Medication: Taking as directed and without issues.  Pill box in use -yes  Self managed - yes   Covid-19: Precautions and sickness symptoms discussed. Wears mask, social distancing, hand hygiene as appropriate.   Activities of Daily Living Patient denies needing assistance with: household chores, feeding themselves, getting from bed to chair, getting to the toilet, bathing/showering, dressing, managing money, or preparing meals.   Discussed the importance of a healthy diet, water intake and the benefits of aerobic exercise.  Physical activity- no routine  Diet:  Low Carb Water: good intake Caffeine: 1 cup of coffee  Other Providers Patient Care Team: Leone Haven, MD as PCP - General (Family Medicine) Exercise Activities and Dietary recommendations Current Exercise Habits: The patient does not participate in regular exercise at present  Goals      Patient  Stated   . Weight (lb) < 150 lb (68 kg) (pt-stated)     Goal 145lb Stay as active as possible       Fall Risk Fall Risk  11/03/2019 06/21/2019 11/02/2018 11/06/2016  09/24/2016  Falls in the past year? 1 0 0 Yes No  Number falls in past yr: 0 0 - 1 -  Injury with Fall? 0 - - - -  Comment Missed a step, lost her balance and fell down the stairs. She did not seek medical care. - - - -  Risk for fall due to : - - - (No Data) -  Risk for fall due to: Comment - - - L leg weakness -  Follow up Falls evaluation completed;Falls prevention discussed;Education provided Falls evaluation completed - Falls prevention discussed -   Timed Get Up and Go performed: no, virtual visit  Depression Screen PHQ 2/9 Scores 11/03/2019 06/21/2019 11/02/2018 07/15/2018  PHQ - 2 Score 1 0 0 0  PHQ- 9 Score - - - -  Exception Documentation - - - -     Cognitive Function MMSE - Mini Mental State Exam 11/06/2016  Orientation to time 5  Orientation to Place 5  Registration 3  Attention/ Calculation 5  Recall 3  Language- name 2 objects 2  Language- repeat 1  Language- follow 3 step command 3  Language- read & follow direction 1  Write a sentence 1  Copy design 1  Total score 30     6CIT Screen 11/03/2019 11/02/2018  What Year? 0 points 0 points  What month? 0 points 0 points  What time? 0 points 0 points  Count back from 20 0 points 0 points  Months in reverse 0 points 0 points  Repeat phrase 0 points 0 points  Total Score 0 0    Immunization History  Administered Date(s) Administered  . Influenza,inj,Quad PF,6+ Mos 10/30/2015, 07/01/2017, 07/15/2018  . Pneumococcal Polysaccharide-23 10/30/2015  . Tdap 02/03/2014    Screening Tests Health Maintenance  Topic Date Due  . INFLUENZA VACCINE  11/22/2019 (Originally 03/25/2019)  . FOOT EXAM  11/29/2019 (Originally 10/15/2018)  . PAP SMEAR-Modifier  11/29/2019 (Originally 03/03/1976)  . HEMOGLOBIN A1C  11/29/2019 (Originally 05/05/2019)  . OPHTHALMOLOGY EXAM  11/29/2019 (Originally 02/05/2017)  . HIV Screening  11/29/2019 (Originally 03/03/1970)  . COLONOSCOPY  11/02/2020 (Originally 12/12/2012)  . MAMMOGRAM  01/26/2020    . TETANUS/TDAP  02/04/2024  . Hepatitis C Screening  Completed     Plan:   Keep all routine maintenance appointments.   Follow up 11/29/19 @ 11:00.  Medicare Attestation I have personally reviewed: The patient's medical and social history Their use of alcohol, tobacco or illicit drugs Their current medications and supplements The patient's functional ability including ADLs,fall risks, home safety risks, cognitive, and hearing and visual impairment Diet and physical activities Evidence for depression   I have reviewed and discussed with patient certain preventive protocols, quality metrics, and best practice recommendations.     Varney Biles, LPN  4/69/5072

## 2019-11-03 NOTE — Patient Instructions (Addendum)
  Tammy Acosta , Thank you for taking time to come for your Medicare Wellness Visit. I appreciate your ongoing commitment to your health goals. Please review the following plan we discussed and let me know if I can assist you in the future.   These are the goals we discussed: Goals      Patient Stated   . Weight (lb) < 150 lb (68 kg) (pt-stated)     Goal 145lb Stay as active as possible       This is a list of the screening recommended for you and due dates:  Health Maintenance  Topic Date Due  . Flu Shot  11/22/2019*  . Complete foot exam   11/29/2019*  . Pap Smear  11/29/2019*  . Hemoglobin A1C  11/29/2019*  . Eye exam for diabetics  11/29/2019*  . HIV Screening  11/29/2019*  . Colon Cancer Screening  11/02/2020*  . Mammogram  01/26/2020  . Tetanus Vaccine  02/04/2024  .  Hepatitis C: One time screening is recommended by Center for Disease Control  (CDC) for  adults born from [birth year] through 1966.   Completed  *Topic was postponed. The date shown is not the original due date.

## 2019-11-05 NOTE — Progress Notes (Signed)
I have reviewed the above note and agree.  Burr Soffer, M.D.  

## 2019-11-06 ENCOUNTER — Ambulatory Visit: Payer: Medicare Other

## 2019-11-06 ENCOUNTER — Ambulatory Visit: Payer: Medicare Other | Admitting: Family Medicine

## 2019-11-08 ENCOUNTER — Other Ambulatory Visit: Payer: Self-pay | Admitting: Family Medicine

## 2019-11-08 ENCOUNTER — Other Ambulatory Visit: Payer: Self-pay | Admitting: Internal Medicine

## 2019-11-08 DIAGNOSIS — M7918 Myalgia, other site: Secondary | ICD-10-CM

## 2019-11-09 ENCOUNTER — Other Ambulatory Visit: Payer: Self-pay

## 2019-11-09 ENCOUNTER — Other Ambulatory Visit: Payer: Self-pay | Admitting: Family Medicine

## 2019-11-09 DIAGNOSIS — E785 Hyperlipidemia, unspecified: Secondary | ICD-10-CM

## 2019-11-10 ENCOUNTER — Other Ambulatory Visit: Payer: Self-pay | Admitting: Family Medicine

## 2019-11-10 DIAGNOSIS — M7918 Myalgia, other site: Secondary | ICD-10-CM

## 2019-11-10 DIAGNOSIS — E785 Hyperlipidemia, unspecified: Secondary | ICD-10-CM

## 2019-11-10 MED ORDER — TIZANIDINE HCL 4 MG PO TABS: ORAL_TABLET | ORAL | 0 refills | Status: DC

## 2019-11-10 NOTE — Telephone Encounter (Signed)
Patient is out tiZANidine (ZANAFLEX) 4 MG tablet and atorvastatin (LIPITOR) 80 MG tablet. Patient is out of tiZanidine.

## 2019-11-14 ENCOUNTER — Other Ambulatory Visit: Payer: Self-pay | Admitting: Family Medicine

## 2019-11-15 NOTE — Telephone Encounter (Signed)
Patient had an MRI previously ordered to evaluate her trigeminal neuralgia though it was not completed. I would like her to complete this. Please see if she is willing to have this scheduled.

## 2019-11-16 NOTE — Telephone Encounter (Signed)
I called and spoke with the patient and she stated that she had not completed the MRI because she is extremely claustrophobic and she cannot do a regular MRI.  She also would like for you to put in a referral to Ortho because she has an appt with you on 11/29/2019 but she cannot wait that long because her back and legs are really bothering her, In the morning she stated after lying down for a long period of time when she gets up she cannot walk at all.  I informed her that you sent the medication to the pharmacy as well.  Tammy Acosta,cma

## 2019-11-17 NOTE — Telephone Encounter (Signed)
Please clarify what she means by her legs don't work. Is this because of pain or weakness? What issue is she having with her back and legs? Is it pain? How long has it been going on? Any other symptoms with this?

## 2019-11-27 ENCOUNTER — Other Ambulatory Visit: Payer: Self-pay | Admitting: Family Medicine

## 2019-11-29 ENCOUNTER — Encounter: Payer: Self-pay | Admitting: Family Medicine

## 2019-11-29 ENCOUNTER — Ambulatory Visit (INDEPENDENT_AMBULATORY_CARE_PROVIDER_SITE_OTHER): Payer: Medicare Other | Admitting: Family Medicine

## 2019-11-29 ENCOUNTER — Other Ambulatory Visit: Payer: Self-pay

## 2019-11-29 ENCOUNTER — Ambulatory Visit (INDEPENDENT_AMBULATORY_CARE_PROVIDER_SITE_OTHER): Payer: Medicare Other

## 2019-11-29 VITALS — BP 110/70 | HR 84 | Temp 95.9°F | Ht 62.0 in | Wt 173.8 lb

## 2019-11-29 DIAGNOSIS — E1142 Type 2 diabetes mellitus with diabetic polyneuropathy: Secondary | ICD-10-CM

## 2019-11-29 DIAGNOSIS — M545 Low back pain, unspecified: Secondary | ICD-10-CM

## 2019-11-29 DIAGNOSIS — Z720 Tobacco use: Secondary | ICD-10-CM

## 2019-11-29 DIAGNOSIS — G500 Trigeminal neuralgia: Secondary | ICD-10-CM | POA: Diagnosis not present

## 2019-11-29 DIAGNOSIS — I700 Atherosclerosis of aorta: Secondary | ICD-10-CM | POA: Diagnosis not present

## 2019-11-29 DIAGNOSIS — R140 Abdominal distension (gaseous): Secondary | ICD-10-CM

## 2019-11-29 DIAGNOSIS — R569 Unspecified convulsions: Secondary | ICD-10-CM

## 2019-11-29 DIAGNOSIS — E785 Hyperlipidemia, unspecified: Secondary | ICD-10-CM | POA: Diagnosis not present

## 2019-11-29 LAB — LIPID PANEL
Cholesterol: 151 mg/dL (ref 0–200)
HDL: 34.3 mg/dL — ABNORMAL LOW (ref >39.00)
NonHDL: 116.98
Total CHOL/HDL Ratio: 4
Triglycerides: 261 mg/dL — ABNORMAL HIGH (ref 0.0–149.0)
VLDL: 52.2 mg/dL — ABNORMAL HIGH (ref 0.0–40.0)

## 2019-11-29 LAB — COMPREHENSIVE METABOLIC PANEL
ALT: 15 U/L (ref 0–35)
AST: 17 U/L (ref 0–37)
Albumin: 4.1 g/dL (ref 3.5–5.2)
Alkaline Phosphatase: 62 U/L (ref 39–117)
BUN: 14 mg/dL (ref 6–23)
CO2: 30 mEq/L (ref 19–32)
Calcium: 8.9 mg/dL (ref 8.4–10.5)
Chloride: 95 mEq/L — ABNORMAL LOW (ref 96–112)
Creatinine, Ser: 0.99 mg/dL (ref 0.40–1.20)
GFR: 56.34 mL/min — ABNORMAL LOW (ref >60.00)
Glucose, Bld: 210 mg/dL — ABNORMAL HIGH (ref 70–99)
Potassium: 3.9 mEq/L (ref 3.5–5.1)
Sodium: 132 mEq/L — ABNORMAL LOW (ref 135–145)
Total Bilirubin: 0.2 mg/dL (ref 0.2–1.2)
Total Protein: 6.9 g/dL (ref 6.0–8.3)

## 2019-11-29 LAB — HEMOGLOBIN A1C: Hgb A1c MFr Bld: 7.4 % — ABNORMAL HIGH (ref 4.6–6.5)

## 2019-11-29 LAB — LDL CHOLESTEROL, DIRECT: Direct LDL: 81 mg/dL

## 2019-11-29 NOTE — Assessment & Plan Note (Signed)
Continue Trileptal 

## 2019-11-29 NOTE — Assessment & Plan Note (Signed)
Check x-ray.  She will monitor.

## 2019-11-29 NOTE — Assessment & Plan Note (Signed)
Continue risk factor management. 

## 2019-11-29 NOTE — Assessment & Plan Note (Signed)
Suspect her prior sensation was related to gas.  She will monitor for recurrence.  Benign abdominal exam.

## 2019-11-29 NOTE — Patient Instructions (Signed)
Nice to see you. We will check lab work today and contact you with the results.  We will also get an x-ray.

## 2019-11-29 NOTE — Assessment & Plan Note (Signed)
Denies recent seizures.  Discussed that she should never discontinue the gabapentin or Trileptal suddenly.

## 2019-11-29 NOTE — Assessment & Plan Note (Signed)
Check A1c.  Continue current regimen. 

## 2019-11-29 NOTE — Progress Notes (Signed)
Tommi Rumps, MD Phone: [phone]  Tammy Acosta is a 64 y.o. female who presents today for follow-up.  Diabetes: Patient notes its been running higher since she had her fall.  The highest blood sugar she is gotten is 170.  Taking Basaglar 48 units once daily and Metformin.  No polyuria or polydipsia.  No hypoglycemia.  She is going to schedule with her ophthalmologist today.  Trigeminal neuralgia: Patient notes rarely having faint symptoms though it is significantly improved on the Trileptal.  Abdominal abnormality: Patient notes previously it felt like she had a baby inside of her that was kicking.  She had no pain associated with this.  Notes it has improved.  No diarrhea.  Full: Patient notes about a month ago she lost her balance going down the stairs outside at her house.  She fell down 4 stairs.  She did hit her head though had no loss of consciousness.  She has had no headaches.  She had right rib injury though that pain has improved.  About a week later she developed low back pain radiating to her bilateral hamstrings.  Bothers her mostly in the morning.  Improves a little bit after she is up and moving around.  Notes the pain is constant.  She does take a Tylenol and occasional Aleve with little benefit.  No numbness.  She notes her legs feel weak.  Social History   Tobacco Use  Smoking Status Current Some Day Smoker  . Packs/day: 0.00  . Years: 40.00  . Pack years: 0.00  . Types: Cigarettes  Smokeless Tobacco Never Used     ROS see history of present illness  Objective  Physical Exam Vitals:   11/29/19 1101  BP: 110/70  Pulse: 84  Temp: (!) 95.9 F (35.5 C)  SpO2: 97%    BP Readings from Last 3 Encounters:  11/29/19 110/70  11/02/18 112/80  11/02/18 112/80   Wt Readings from Last 3 Encounters:  11/29/19 173 lb 12.8 oz (78.8 kg)  11/03/19 162 lb (73.5 kg)  06/21/19 162 lb (73.5 kg)    Physical Exam Constitutional:      General: She is not in  acute distress.    Appearance: She is not diaphoretic.  HENT:     Right Ear: Tympanic membrane normal.     Left Ear: Tympanic membrane normal.  Cardiovascular:     Rate and Rhythm: Normal rate and regular rhythm.     Heart sounds: Normal heart sounds.  Pulmonary:     Effort: Pulmonary effort is normal.     Breath sounds: Normal breath sounds.  Abdominal:     General: Bowel sounds are normal. There is no distension.     Palpations: Abdomen is soft.     Tenderness: There is no abdominal tenderness. There is no rebound.  Musculoskeletal:     Comments: No midline spine tenderness, no midline spine step-off, there is left lumbar paraspinous muscular tenderness with no overlying skin changes, right ribs with no tenderness or overlying skin changes  Skin:    General: Skin is warm and dry.  Neurological:     Mental Status: She is alert.     Comments: 5/5 strength bilateral quads, hamstrings, plantar flexion, and dorsiflexion, sensation light touch intact bilateral lower extremities, 2+ patellar reflexes      Assessment/Plan: Please see individual problem list.  Atherosclerosis of aorta (HCC) Continue risk factor management.  Type 2 diabetes mellitus (HCC) Check A1c.  Continue current regimen.  Trigeminal neuralgia  Continue Trileptal.  Acute bilateral low back pain without sciatica Check x-ray.  She will monitor.  Sensation of gaseous abdominal fullness Suspect her prior sensation was related to gas.  She will monitor for recurrence.  Benign abdominal exam.  Seizure ([place]) Denies recent seizures.  Discussed that she should never discontinue the gabapentin or Trileptal suddenly.  Tobacco abuse Patient has cut down to 2 cigarettes a week.  Encouraged her to quit all the way.   Orders Placed This Encounter  Procedures  . DG Lumbar Spine Complete    Standing Status:   Future    Number of Occurrences:   1    Standing Expiration Date:   01/28/2021    Order Specific Question:    Reason for Exam (SYMPTOM  OR DIAGNOSIS REQUIRED)    Answer:   low back pain radiating to posterior thighs after a fall about a month ago    Order Specific Question:   Preferred imaging location?    Answer:   [facility] Specific Question:   Radiology Contrast Protocol - do NOT remove file path    Answer:   [email or web address]  . Lipid panel  . Comp Met (CMET)  . HgB A1c    No orders of the defined types were placed in this encounter.   This visit occurred during the SARS-CoV-2 public health emergency.  Safety protocols were in place, including screening questions prior to the visit, additional usage of staff PPE, and extensive cleaning of exam room while observing appropriate contact time as indicated for disinfecting solutions.    Tommi Rumps, MD [place]

## 2019-11-29 NOTE — Assessment & Plan Note (Signed)
Patient has cut down to 2 cigarettes a week.  Encouraged her to quit all the way.

## 2019-11-30 ENCOUNTER — Telehealth: Payer: Self-pay

## 2019-11-30 NOTE — Telephone Encounter (Signed)
Today 3 boxes of Basglar came in for the patient.  I called and informed the patient on VM  that it was ready to be picked up, when coming in ask for Caylen Kuwahara for medication.  Tache Bobst,cma

## 2019-11-30 NOTE — Telephone Encounter (Signed)
Pt called back and was informed of info below

## 2019-12-03 ENCOUNTER — Other Ambulatory Visit: Payer: Self-pay | Admitting: Family Medicine

## 2019-12-03 DIAGNOSIS — B0222 Postherpetic trigeminal neuralgia: Secondary | ICD-10-CM

## 2019-12-03 DIAGNOSIS — M792 Neuralgia and neuritis, unspecified: Secondary | ICD-10-CM

## 2019-12-05 ENCOUNTER — Other Ambulatory Visit: Payer: Self-pay | Admitting: Family Medicine

## 2019-12-05 DIAGNOSIS — M8588 Other specified disorders of bone density and structure, other site: Secondary | ICD-10-CM

## 2019-12-10 ENCOUNTER — Other Ambulatory Visit: Payer: Self-pay | Admitting: Family Medicine

## 2019-12-10 ENCOUNTER — Other Ambulatory Visit: Payer: Self-pay

## 2019-12-10 ENCOUNTER — Emergency Department: Payer: Medicare Other

## 2019-12-10 ENCOUNTER — Emergency Department
Admission: EM | Admit: 2019-12-10 | Discharge: 2019-12-10 | Disposition: A | Discharge status: Home / Self Care | Payer: Medicare Other | Attending: Emergency Medicine | Admitting: Emergency Medicine

## 2019-12-10 DIAGNOSIS — M25712 Osteophyte, left shoulder: Secondary | ICD-10-CM | POA: Insufficient documentation

## 2019-12-10 DIAGNOSIS — Z794 Long term (current) use of insulin: Secondary | ICD-10-CM | POA: Insufficient documentation

## 2019-12-10 DIAGNOSIS — Y9389 Activity, other specified: Secondary | ICD-10-CM | POA: Insufficient documentation

## 2019-12-10 DIAGNOSIS — Y92099 Unspecified place in other non-institutional residence as the place of occurrence of the external cause: Secondary | ICD-10-CM | POA: Insufficient documentation

## 2019-12-10 DIAGNOSIS — S199XXA Unspecified injury of neck, initial encounter: Secondary | ICD-10-CM | POA: Diagnosis not present

## 2019-12-10 DIAGNOSIS — S0990XA Unspecified injury of head, initial encounter: Secondary | ICD-10-CM | POA: Diagnosis not present

## 2019-12-10 DIAGNOSIS — Y999 Unspecified external cause status: Secondary | ICD-10-CM | POA: Insufficient documentation

## 2019-12-10 DIAGNOSIS — M25512 Pain in left shoulder: Secondary | ICD-10-CM | POA: Insufficient documentation

## 2019-12-10 DIAGNOSIS — M62838 Other muscle spasm: Secondary | ICD-10-CM | POA: Diagnosis not present

## 2019-12-10 DIAGNOSIS — M779 Enthesopathy, unspecified: Secondary | ICD-10-CM

## 2019-12-10 DIAGNOSIS — E785 Hyperlipidemia, unspecified: Secondary | ICD-10-CM

## 2019-12-10 DIAGNOSIS — M7918 Myalgia, other site: Secondary | ICD-10-CM

## 2019-12-10 DIAGNOSIS — E1140 Type 2 diabetes mellitus with diabetic neuropathy, unspecified: Secondary | ICD-10-CM | POA: Insufficient documentation

## 2019-12-10 DIAGNOSIS — M25511 Pain in right shoulder: Secondary | ICD-10-CM | POA: Diagnosis not present

## 2019-12-10 DIAGNOSIS — S4992XA Unspecified injury of left shoulder and upper arm, initial encounter: Secondary | ICD-10-CM | POA: Diagnosis not present

## 2019-12-10 DIAGNOSIS — W108XXA Fall (on) (from) other stairs and steps, initial encounter: Secondary | ICD-10-CM | POA: Diagnosis not present

## 2019-12-10 DIAGNOSIS — F17210 Nicotine dependence, cigarettes, uncomplicated: Secondary | ICD-10-CM | POA: Insufficient documentation

## 2019-12-10 DIAGNOSIS — W19XXXA Unspecified fall, initial encounter: Secondary | ICD-10-CM

## 2019-12-10 MED ORDER — TRAMADOL HCL 50 MG PO TABS: 50.0000 mg | ORAL_TABLET | Freq: Two times a day (BID) | ORAL | 0 refills | Status: AC

## 2019-12-10 MED ORDER — TRAMADOL HCL 50 MG PO TABS
50.0000 mg | ORAL_TABLET | Freq: Once | ORAL | Status: AC
  Administered 2019-12-10: 21:00:00 50 mg via ORAL
  Filled 2019-12-10: qty 1

## 2019-12-10 NOTE — ED Notes (Signed)
See triage note, pt report falling down approx 3 stairs today. Reports pain with moving her left shoulder.  Reports hitting her head, denies LOC. Denies blood thinner use.  Pt alert and oriented.

## 2019-12-10 NOTE — ED Triage Notes (Signed)
Pt to ED via POV c/o left shoulder pain s/p fall around noon today. Pt states that she was walking up her back stairs and lost her balance falling backwards. Pt states that she hit her head. Pt is no on blood thinner. Pt denies LOC. Pt is A & O, in NAD.

## 2019-12-10 NOTE — ED Provider Notes (Signed)
[facility] Emergency Department Provider Note ____________________________________________  Time seen: 2015  I have reviewed the triage vital signs and the nursing notes.  HISTORY  Chief Complaint  Fall  HPI Tammy Acosta is a 64 y.o. female presents to the ED for evaluation of injury sustained following a mechanical fall.   Patient describes left shoulder pain and disability after a fall today around noon.  She struck she is walking up her back stairway, when she lost balance, fell backwards.  She did hit her head but denies any loss of consciousness.  Patient denies any chest pain, shortness of breath, or other injury.  She presents now for discomfort to the left shoulder.  Past Medical History:  Diagnosis Date  . Altered mental status 02/04/2014  . Arthritis   . Colon polyps   . Depression   . Diabetes mellitus without complication ([place])    Pt states she takes Insulin and metformin.  Marland Kitchen Hypercholesteremia   . Hyperlipidemia   . Hypertension   . Shingles    10/17  . Spine disorder   . Trigeminal herpes zoster (left V1 distribution) 06/24/2016  . Viral upper respiratory illness 12/03/2015    Patient Active Problem List   Diagnosis Date Noted  . Acute bilateral low back pain without sciatica 11/29/2019  . Sensation of gaseous abdominal fullness 11/29/2019  . Trigeminal neuralgia 06/22/2019  . Suspected COVID-19 virus infection 06/22/2019  . Stomach abnormality 06/22/2019  . Left shoulder pain 11/02/2018  . Family history of colon cancer 11/02/2018  . Dental infection 11/02/2018  . Acute pain of right knee 02/22/2018  . Atherosclerosis of aorta ([place]) 01/18/2018  . Elevated lipase 01/09/2018  . Allergic rhinitis 01/09/2018  . Nodule of finger 10/15/2017  . Seizure ([place]) 07/20/2017  . Fatigue 11/06/2016  . Chest pain 01/02/2016  . Tobacco abuse 10/30/2015  . Hypomagnesemia 10/22/2015  . Osteoarthritis of hip (Location of Tertiary source of  pain) (Bilateral) (L>R) 10/21/2015  . Diabetic peripheral neuropathy ([place]) 10/21/2015  . Chronic low back pain (Location of Primary Source of Pain) (Bilateral) (L>R) 06/18/2015  . Essential (primary) hypertension 05/21/2015  . Hyperlipidemia 02/13/2014  . Anxiety and depression 02/04/2014  . Type 2 diabetes mellitus ([place]) 02/04/2014    Past Surgical History:  Procedure Laterality Date  . ABDOMINAL HYSTERECTOMY    . BACK SURGERY    . CHOLECYSTECTOMY    . KYPHOPLASTY      Prior to Admission medications   Medication Sig Start Date End Date Taking? Authorizing Provider  amLODipine (NORVASC) 5 MG tablet Take 1 tablet by mouth once daily 10/09/19   Leone Haven, MD  atorvastatin (LIPITOR) 80 MG tablet Take 1 tablet by mouth once daily 11/09/19   Leone Haven, MD  blood glucose meter kit and supplies Dispense One touch meter.  E11.9 10/03/15   Leone Haven, MD  Blood Glucose Monitoring Suppl (ONE TOUCH ULTRA 2) w/Device KIT Dispense 1 meter to use to test blood glucose once daily. Dx code: E11.42. 04/12/19   Leone Haven, MD  fluticasone Asencion Islam) 50 MCG/ACT nasal spray Place 2 sprays into both nostrils daily. 06/21/19   Leone Haven, MD  gabapentin (NEURONTIN) 800 MG tablet Take 1 tablet by mouth 4 times daily 12/04/19   Leone Haven, MD  glucose blood (ONE TOUCH ULTRA TEST) test strip Use as directed to test blood glucose up to three times daily. Dx code: X83.33. 04/12/19   Leone Haven, MD  hydrochlorothiazide (HYDRODIURIL) 25 MG tablet Take 1 tablet by mouth once daily 11/10/19   Crecencio Mc, MD  ibuprofen (ADVIL,MOTRIN) 800 MG tablet Take 800 mg by mouth every 8 (eight) hours as needed. for pain 10/31/18   [provider]  Icosapent Ethyl (VASCEPA) 1 g CAPS Take 2 capsules (2 g total) by mouth 2 (two) times daily. 07/18/18   Leone Haven, MD  Insulin Glargine (BASAGLAR KWIKPEN) 100 UNIT/ML SOPN Inject 0.44 mLs (44 Units total) into the  skin daily. 12/17/18   Leone Haven, MD  Insulin Pen Needle (PEN NEEDLES) 32G X 5 MM MISC 1 pen by Does not apply route daily. Use to inject insulin daily 01/10/18   Leone Haven, MD  Lancets [facility] ULTRASOFT) lancets Use as instructed to test blood glucose up to three times daily. Dx code: E11.42. 09/19/19   Leone Haven, MD  lisinopril (ZESTRIL) 40 MG tablet Take 1 tablet by mouth once daily 11/27/19   Leone Haven, MD  loratadine (CLARITIN) 10 MG tablet Take 1 tablet (10 mg total) by mouth daily. 06/21/19   Leone Haven, MD  metFORMIN (GLUCOPHAGE) 1000 MG tablet TAKE 1 TABLET BY MOUTH TWICE DAILY WITH A MEAL 08/10/19   Leone Haven, MD  naproxen sodium (ALEVE) 220 MG tablet Take 220 mg by mouth 2 (two) times daily.    [provider]  nitroGLYCERIN (NITROSTAT) 0.4 MG SL tablet Place 1 tablet (0.4 mg total) under the tongue every 5 (five) minutes as needed for chest pain. 01/16/16   Wende Bushy, MD  Oxcarbazepine (TRILEPTAL) 300 MG tablet Take 1 tablet by mouth twice daily 11/15/19   Leone Haven, MD  predniSONE (DELTASONE) 20 MG tablet Take 2 tablets (40 mg total) by mouth daily with breakfast. 01/04/19   Leone Haven, MD  sertraline (ZOLOFT) 100 MG tablet Take 1 tablet by mouth once daily 11/27/19   Leone Haven, MD  tiZANidine (ZANAFLEX) 4 MG tablet TAKE 1 TABLET BY MOUTH EVERY 8 HOURS AS NEEDED FOR MUSCLE SPASM 11/10/19   Leone Haven, MD  traMADol (ULTRAM) 50 MG tablet Take 1 tablet (50 mg total) by mouth 2 (two) times daily for 5 days. 12/10/19 12/15/19  Jazari Ober, Dannielle Karvonen, PA-C    Allergies Penicillins and Tetracyclines & related  Family History  Problem Relation Age of Onset  . Heart disease Mother   . Hypertension Mother   . Cancer Mother        breast  . Breast cancer Mother        early 44's  . Heart disease Father   . Arthritis Other        Parent, other relative  . Colon cancer Other        Parent  . Lung  cancer Other        Other relative  . Diabetes Other        Other relative  . Uterine cancer Other        Other relative  . Cancer Maternal Aunt        breast  . Breast cancer Maternal Aunt   . Cancer Maternal Aunt        ovarian  . Breast cancer Cousin   . Breast cancer Cousin   . Breast cancer Cousin     Social History Social History   Tobacco Use  . Smoking status: Current Some Day Smoker    Packs/day: 0.00    Years:  40.00    Pack years: 0.00    Types: Cigarettes  . Smokeless tobacco: Never Used  Substance Use Topics  . Alcohol use: No  . Drug use: No    Review of Systems  Constitutional: Negative for fever. Eyes: Negative for visual changes. ENT: Negative for sore throat. Cardiovascular: Negative for chest pain. Respiratory: Negative for shortness of breath. Gastrointestinal: Negative for abdominal pain, vomiting and diarrhea. Genitourinary: Negative for dysuria. Musculoskeletal: Negative for back pain.  Left shoulder pain as above. Skin: Negative for rash. Neurological: Negative for headaches, focal weakness or numbness. ____________________________________________  PHYSICAL EXAM:  VITAL SIGNS: ED Triage Vitals  Enc Vitals Group     BP 12/10/19 1808 (!) 142/67     Pulse Rate 12/10/19 1808 80     Resp 12/10/19 1808 16     Temp 12/10/19 1808 98.9 F (37.2 C)     Temp Source 12/10/19 1808 Oral     SpO2 12/10/19 1808 94 %     Weight 12/10/19 1805 172 lb (78 kg)     Height 12/10/19 1805 '5\' 2"'$  (1.575 m)     Head Circumference --      Peak Flow --      Pain Score 12/10/19 1805 5     Pain Loc --      Pain Edu? --      Excl. in [place]? --     Constitutional: Alert and oriented. Well appearing and in no distress. Head: Normocephalic and atraumatic. Eyes: Conjunctivae are normal. Normal extraocular movements Neck: Supple.  Normal range of motion without crepitus. Cardiovascular: Normal rate, regular rhythm. Normal distal pulses. Respiratory: Normal  respiratory effort. No wheezes/rales/rhonchi. Gastrointestinal: Soft and nontender. No distention. Musculoskeletal: Left shoulder without any obvious deformity or dislocation.  No sulcus sign is noted.  Patient with self limited range of motion due to pain.  Palpation over the distal acromion elicits pain response.  Normal composite fist distally.  Normal elbow and wrist range of motion exam.  Nontender with normal range of motion in all extremities.  Neurologic: Cranial nerves II through XII grossly intact.  Normal gait without ataxia. Normal speech and language. No gross focal neurologic deficits are appreciated. Skin:  Skin is warm, dry and intact. No rash noted. Psychiatric: Mood and affect are normal. Patient exhibits appropriate insight and judgment. ____________________________________________   RADIOLOGY  DG Left Shoulder  FINDINGS: There is no evidence of fracture or dislocation. Similar glenohumeral spurring. Unchanged subacromial spur. Soft tissues are unremarkable. No fracture of included left ribs. ____________________________________________  PROCEDURES  Ultram 50 mg Arm sling  Procedures ____________________________________________  INITIAL IMPRESSION / ASSESSMENT AND PLAN / ED COURSE  Patient with ED evaluation of injury sustained following mechanical fall at home.  Her primary complaint was pain and disability to the left shoulder.  Patient was concern for a possible dislocation of the shoulder.  CT imaging of her head and neck were negative for any acute findings.  X-ray evidence does not reveal any acute dislocation.  She does have a moderate subacromion spur which appears to perhaps be acutely avulsed.  Otherwise patient symptoms are consistent with shoulder impingement and rotator cuff injury.  She is placed in a sling for comfort and support.  Prescriptions for Ultram are provided for benefit.  She will follow-up with orthopedics for ongoing symptom  management.  Tammy Acosta was evaluated in Emergency Department on 12/10/2019 for the symptoms described in the history of present illness. She was evaluated in the  context of the global COVID-19 pandemic, which necessitated consideration that the patient might be at risk for infection with the SARS-CoV-2 virus that causes COVID-19. Institutional protocols and algorithms that pertain to the evaluation of patients at risk for COVID-19 are in a state of rapid change based on information released by regulatory bodies including the CDC and federal and state organizations. These policies and algorithms were followed during the patient's care in the ED.  I reviewed the patient's prescription history over the last 12 months in the multi-state controlled substances database(s) that includes [place], [place], [place], [place], [place], [place], [place], [place], [place], [place], [place], [place], [place], and [place].  Results were notable for no Rx history. ____________________________________________  FINAL CLINICAL IMPRESSION(S) / ED DIAGNOSES  Final diagnoses:  Fall in home, initial encounter  Acute pain of left shoulder  Bone spur      Anaijah Augsburger, Dannielle Karvonen, PA-C 12/10/19 2053    Drenda Freeze, MD 12/10/19 2314

## 2019-12-10 NOTE — Discharge Instructions (Addendum)
You are being treated for acute left shoulder pain and disability following your fall.  X-ray reveals a large bone spur to the shoulder joint.  The spur may be causing impingement and/or rotator cuff injury.  Use the pain medicine as needed and take your daily anti-inflammatory muscle relaxant as directed.  Follow-up with orthopedics for ongoing symptom management.  Apply ice to the shoulder to reduce pain and discomfort.  Wear the sling as needed for support.

## 2019-12-12 ENCOUNTER — Other Ambulatory Visit: Payer: Self-pay | Admitting: Family Medicine

## 2019-12-12 DIAGNOSIS — E782 Mixed hyperlipidemia: Secondary | ICD-10-CM

## 2019-12-12 MED ORDER — EZETIMIBE 10 MG PO TABS: 10.0000 mg | ORAL_TABLET | Freq: Every day | ORAL | 3 refills | Status: DC

## 2019-12-12 MED ORDER — BASAGLAR KWIKPEN 100 UNIT/ML ~~LOC~~ SOPN: 52.0000 [IU] | PEN_INJECTOR | Freq: Every day | SUBCUTANEOUS | 1 refills | Status: DC

## 2019-12-15 DIAGNOSIS — S46012A Strain of muscle(s) and tendon(s) of the rotator cuff of left shoulder, initial encounter: Secondary | ICD-10-CM | POA: Diagnosis not present

## 2019-12-17 ENCOUNTER — Other Ambulatory Visit: Payer: Self-pay | Admitting: Family Medicine

## 2019-12-18 ENCOUNTER — Other Ambulatory Visit: Payer: Medicare Other

## 2020-01-01 ENCOUNTER — Other Ambulatory Visit: Payer: Medicare Other

## 2020-01-01 DIAGNOSIS — S46012A Strain of muscle(s) and tendon(s) of the rotator cuff of left shoulder, initial encounter: Secondary | ICD-10-CM | POA: Diagnosis not present

## 2020-01-01 DIAGNOSIS — M24112 Other articular cartilage disorders, left shoulder: Secondary | ICD-10-CM | POA: Diagnosis not present

## 2020-01-01 DIAGNOSIS — M898X1 Other specified disorders of bone, shoulder: Secondary | ICD-10-CM | POA: Diagnosis not present

## 2020-01-01 DIAGNOSIS — M25412 Effusion, left shoulder: Secondary | ICD-10-CM | POA: Diagnosis not present

## 2020-01-01 DIAGNOSIS — M89312 Hypertrophy of bone, left shoulder: Secondary | ICD-10-CM | POA: Diagnosis not present

## 2020-01-04 DIAGNOSIS — M75122 Complete rotator cuff tear or rupture of left shoulder, not specified as traumatic: Secondary | ICD-10-CM | POA: Diagnosis not present

## 2020-01-08 ENCOUNTER — Ambulatory Visit
Admission: RE | Admit: 2020-01-08 | Discharge: 2020-01-08 | Disposition: A | Discharge status: Home / Self Care | Payer: Medicare Other | Source: Ambulatory Visit | Attending: Family Medicine | Admitting: Family Medicine

## 2020-01-08 DIAGNOSIS — Z780 Asymptomatic menopausal state: Secondary | ICD-10-CM | POA: Diagnosis not present

## 2020-01-08 DIAGNOSIS — M8588 Other specified disorders of bone density and structure, other site: Secondary | ICD-10-CM | POA: Insufficient documentation

## 2020-01-10 ENCOUNTER — Other Ambulatory Visit: Payer: Self-pay | Admitting: Family Medicine

## 2020-01-10 DIAGNOSIS — M7918 Myalgia, other site: Secondary | ICD-10-CM

## 2020-01-10 DIAGNOSIS — E785 Hyperlipidemia, unspecified: Secondary | ICD-10-CM

## 2020-01-12 ENCOUNTER — Other Ambulatory Visit: Payer: Self-pay | Admitting: Family Medicine

## 2020-01-16 DIAGNOSIS — M19012 Primary osteoarthritis, left shoulder: Secondary | ICD-10-CM | POA: Diagnosis not present

## 2020-01-18 ENCOUNTER — Encounter: Payer: Self-pay | Admitting: Family Medicine

## 2020-01-18 DIAGNOSIS — E119 Type 2 diabetes mellitus without complications: Secondary | ICD-10-CM | POA: Diagnosis not present

## 2020-01-18 LAB — HM DIABETES EYE EXAM

## 2020-01-23 ENCOUNTER — Other Ambulatory Visit: Payer: Medicare Other

## 2020-01-29 ENCOUNTER — Other Ambulatory Visit (INDEPENDENT_AMBULATORY_CARE_PROVIDER_SITE_OTHER): Payer: Medicare Other

## 2020-01-29 ENCOUNTER — Other Ambulatory Visit: Payer: Self-pay

## 2020-01-29 DIAGNOSIS — E782 Mixed hyperlipidemia: Secondary | ICD-10-CM | POA: Diagnosis not present

## 2020-01-30 LAB — LIPID PANEL
Cholesterol: 94 mg/dL (ref 0–200)
HDL: 27.7 mg/dL — ABNORMAL LOW (ref >39.00)
LDL Cholesterol: 37 mg/dL (ref 0–99)
NonHDL: 65.97
Total CHOL/HDL Ratio: 3
Triglycerides: 146 mg/dL (ref 0.0–149.0)
VLDL: 29.2 mg/dL (ref 0.0–40.0)

## 2020-02-06 ENCOUNTER — Telehealth: Payer: Self-pay | Admitting: Family Medicine

## 2020-02-06 DIAGNOSIS — M19012 Primary osteoarthritis, left shoulder: Secondary | ICD-10-CM | POA: Diagnosis not present

## 2020-02-06 DIAGNOSIS — Z20822 Contact with and (suspected) exposure to covid-19: Secondary | ICD-10-CM | POA: Diagnosis not present

## 2020-02-06 DIAGNOSIS — Z01818 Encounter for other preprocedural examination: Secondary | ICD-10-CM | POA: Diagnosis not present

## 2020-02-06 DIAGNOSIS — Z01812 Encounter for preprocedural laboratory examination: Secondary | ICD-10-CM | POA: Diagnosis not present

## 2020-02-06 NOTE — Telephone Encounter (Signed)
Message was sent to Catie.  Sadeel Fiddler,cma

## 2020-02-06 NOTE — Telephone Encounter (Signed)
Can you check with Catie to see how we complete this refill for the patient?

## 2020-02-06 NOTE — Telephone Encounter (Signed)
I received the information from Catie and printed the form needed and I placed it in the sign basket for your signature and sig of the medication.  Baili Stang,cma

## 2020-02-06 NOTE — Telephone Encounter (Signed)
Catie can you help with this? See note below. Thanks

## 2020-02-06 NOTE — Telephone Encounter (Signed)
Patient called and said that [facility] needs a prescription for her insulin. Phone for [employer] is [phone]

## 2020-02-06 NOTE — Telephone Encounter (Signed)
Complete this form and fax in!  [email or web address]

## 2020-02-07 ENCOUNTER — Other Ambulatory Visit: Payer: Self-pay

## 2020-02-07 DIAGNOSIS — M7918 Myalgia, other site: Secondary | ICD-10-CM

## 2020-02-07 DIAGNOSIS — E785 Hyperlipidemia, unspecified: Secondary | ICD-10-CM

## 2020-02-07 MED ORDER — ATORVASTATIN CALCIUM 80 MG PO TABS: 80.0000 mg | ORAL_TABLET | Freq: Every day | ORAL | 1 refills | Status: DC

## 2020-02-07 MED ORDER — TIZANIDINE HCL 4 MG PO TABS: ORAL_TABLET | ORAL | 0 refills | Status: DC

## 2020-02-07 MED ORDER — HYDROCHLOROTHIAZIDE 25 MG PO TABS: 25.0000 mg | ORAL_TABLET | Freq: Every day | ORAL | 0 refills | Status: DC

## 2020-02-07 NOTE — Telephone Encounter (Signed)
Signed. Please fax.

## 2020-02-08 DIAGNOSIS — G8929 Other chronic pain: Secondary | ICD-10-CM | POA: Diagnosis not present

## 2020-02-08 DIAGNOSIS — E7800 Pure hypercholesterolemia, unspecified: Secondary | ICD-10-CM | POA: Diagnosis not present

## 2020-02-08 DIAGNOSIS — Z880 Allergy status to penicillin: Secondary | ICD-10-CM | POA: Diagnosis not present

## 2020-02-08 DIAGNOSIS — Z87891 Personal history of nicotine dependence: Secondary | ICD-10-CM | POA: Diagnosis not present

## 2020-02-08 DIAGNOSIS — Z7984 Long term (current) use of oral hypoglycemic drugs: Secondary | ICD-10-CM | POA: Diagnosis not present

## 2020-02-08 DIAGNOSIS — Z886 Allergy status to analgesic agent status: Secondary | ICD-10-CM | POA: Diagnosis not present

## 2020-02-08 DIAGNOSIS — M75122 Complete rotator cuff tear or rupture of left shoulder, not specified as traumatic: Secondary | ICD-10-CM | POA: Diagnosis not present

## 2020-02-08 DIAGNOSIS — E878 Other disorders of electrolyte and fluid balance, not elsewhere classified: Secondary | ICD-10-CM | POA: Diagnosis not present

## 2020-02-08 DIAGNOSIS — M19012 Primary osteoarthritis, left shoulder: Secondary | ICD-10-CM | POA: Diagnosis not present

## 2020-02-08 DIAGNOSIS — M25512 Pain in left shoulder: Secondary | ICD-10-CM | POA: Diagnosis not present

## 2020-02-08 DIAGNOSIS — E119 Type 2 diabetes mellitus without complications: Secondary | ICD-10-CM | POA: Diagnosis not present

## 2020-02-08 DIAGNOSIS — G8918 Other acute postprocedural pain: Secondary | ICD-10-CM | POA: Diagnosis not present

## 2020-02-08 DIAGNOSIS — Z881 Allergy status to other antibiotic agents status: Secondary | ICD-10-CM | POA: Diagnosis not present

## 2020-02-08 DIAGNOSIS — E871 Hypo-osmolality and hyponatremia: Secondary | ICD-10-CM | POA: Diagnosis not present

## 2020-02-08 DIAGNOSIS — I10 Essential (primary) hypertension: Secondary | ICD-10-CM | POA: Diagnosis not present

## 2020-02-08 DIAGNOSIS — S46012A Strain of muscle(s) and tendon(s) of the rotator cuff of left shoulder, initial encounter: Secondary | ICD-10-CM | POA: Diagnosis not present

## 2020-02-08 DIAGNOSIS — E785 Hyperlipidemia, unspecified: Secondary | ICD-10-CM | POA: Diagnosis not present

## 2020-02-09 DIAGNOSIS — M75122 Complete rotator cuff tear or rupture of left shoulder, not specified as traumatic: Secondary | ICD-10-CM | POA: Diagnosis not present

## 2020-02-09 DIAGNOSIS — Z881 Allergy status to other antibiotic agents status: Secondary | ICD-10-CM | POA: Diagnosis not present

## 2020-02-09 DIAGNOSIS — E785 Hyperlipidemia, unspecified: Secondary | ICD-10-CM | POA: Diagnosis not present

## 2020-02-09 DIAGNOSIS — E7800 Pure hypercholesterolemia, unspecified: Secondary | ICD-10-CM | POA: Diagnosis not present

## 2020-02-09 DIAGNOSIS — G8929 Other chronic pain: Secondary | ICD-10-CM | POA: Diagnosis not present

## 2020-02-09 DIAGNOSIS — E871 Hypo-osmolality and hyponatremia: Secondary | ICD-10-CM | POA: Diagnosis not present

## 2020-02-09 DIAGNOSIS — I10 Essential (primary) hypertension: Secondary | ICD-10-CM | POA: Diagnosis not present

## 2020-02-09 DIAGNOSIS — Z87891 Personal history of nicotine dependence: Secondary | ICD-10-CM | POA: Diagnosis not present

## 2020-02-09 DIAGNOSIS — E878 Other disorders of electrolyte and fluid balance, not elsewhere classified: Secondary | ICD-10-CM | POA: Diagnosis not present

## 2020-02-09 DIAGNOSIS — M19012 Primary osteoarthritis, left shoulder: Secondary | ICD-10-CM | POA: Diagnosis not present

## 2020-02-09 DIAGNOSIS — Z886 Allergy status to analgesic agent status: Secondary | ICD-10-CM | POA: Diagnosis not present

## 2020-02-09 DIAGNOSIS — E119 Type 2 diabetes mellitus without complications: Secondary | ICD-10-CM | POA: Diagnosis not present

## 2020-02-09 DIAGNOSIS — Z880 Allergy status to penicillin: Secondary | ICD-10-CM | POA: Diagnosis not present

## 2020-02-09 DIAGNOSIS — Z7984 Long term (current) use of oral hypoglycemic drugs: Secondary | ICD-10-CM | POA: Diagnosis not present

## 2020-02-13 ENCOUNTER — Other Ambulatory Visit: Payer: Self-pay | Admitting: Family Medicine

## 2020-02-16 ENCOUNTER — Telehealth: Payer: Self-pay

## 2020-02-16 NOTE — Telephone Encounter (Signed)
I called and spoke with the patient and informed her that her medication Mariella Saa has arrived here at the office and was ready for pickup, patient stated she just had shoulder surgery and would have someone to pick it up on Monday.  Kalani Baray,cma

## 2020-03-01 ENCOUNTER — Ambulatory Visit: Payer: Medicare Other | Admitting: Family Medicine

## 2020-03-02 ENCOUNTER — Other Ambulatory Visit: Payer: Self-pay | Admitting: Family Medicine

## 2020-03-07 ENCOUNTER — Other Ambulatory Visit: Payer: Self-pay | Admitting: Family Medicine

## 2020-03-07 DIAGNOSIS — M7918 Myalgia, other site: Secondary | ICD-10-CM

## 2020-03-20 ENCOUNTER — Other Ambulatory Visit: Payer: Self-pay | Admitting: Family Medicine

## 2020-03-25 DIAGNOSIS — M19012 Primary osteoarthritis, left shoulder: Secondary | ICD-10-CM | POA: Diagnosis not present

## 2020-03-28 NOTE — Telephone Encounter (Addendum)
Called patient left voicemail per DPR.and reminded medication still in office from 02/16/20 to please come and pick up basaglar .

## 2020-04-02 ENCOUNTER — Other Ambulatory Visit: Payer: Self-pay | Admitting: Family Medicine

## 2020-04-02 DIAGNOSIS — Z96612 Presence of left artificial shoulder joint: Secondary | ICD-10-CM | POA: Diagnosis not present

## 2020-04-02 DIAGNOSIS — M7918 Myalgia, other site: Secondary | ICD-10-CM

## 2020-04-08 ENCOUNTER — Other Ambulatory Visit: Payer: Self-pay | Admitting: Family Medicine

## 2020-04-08 DIAGNOSIS — M792 Neuralgia and neuritis, unspecified: Secondary | ICD-10-CM

## 2020-04-08 DIAGNOSIS — E785 Hyperlipidemia, unspecified: Secondary | ICD-10-CM

## 2020-04-08 DIAGNOSIS — B0222 Postherpetic trigeminal neuralgia: Secondary | ICD-10-CM

## 2020-04-15 ENCOUNTER — Other Ambulatory Visit: Payer: Self-pay | Admitting: Family Medicine

## 2020-04-18 ENCOUNTER — Other Ambulatory Visit: Payer: Self-pay | Admitting: Family Medicine

## 2020-04-28 ENCOUNTER — Other Ambulatory Visit: Payer: Self-pay | Admitting: Family Medicine

## 2020-04-28 DIAGNOSIS — M7918 Myalgia, other site: Secondary | ICD-10-CM

## 2020-05-07 ENCOUNTER — Other Ambulatory Visit: Payer: Self-pay | Admitting: Family Medicine

## 2020-05-08 ENCOUNTER — Other Ambulatory Visit: Payer: Self-pay | Admitting: Family Medicine

## 2020-05-08 DIAGNOSIS — E785 Hyperlipidemia, unspecified: Secondary | ICD-10-CM

## 2020-05-16 ENCOUNTER — Other Ambulatory Visit: Payer: Self-pay | Admitting: Family Medicine

## 2020-05-20 ENCOUNTER — Other Ambulatory Visit: Payer: Self-pay | Admitting: Family Medicine

## 2020-06-12 ENCOUNTER — Other Ambulatory Visit: Payer: Self-pay | Admitting: Family Medicine

## 2020-06-12 DIAGNOSIS — M7918 Myalgia, other site: Secondary | ICD-10-CM

## 2020-06-20 ENCOUNTER — Other Ambulatory Visit: Payer: Self-pay | Admitting: Internal Medicine

## 2020-06-28 ENCOUNTER — Telehealth: Payer: Self-pay

## 2020-06-28 NOTE — Telephone Encounter (Signed)
Called and LVM informing the patient that her Basglar had arrived and is ready for pickup here at the office.  Tawan Corkern,cma

## 2020-07-11 ENCOUNTER — Other Ambulatory Visit: Payer: Self-pay | Admitting: Family Medicine

## 2020-07-11 DIAGNOSIS — M7918 Myalgia, other site: Secondary | ICD-10-CM

## 2020-07-12 ENCOUNTER — Telehealth: Payer: Self-pay | Admitting: Family Medicine

## 2020-07-12 NOTE — Telephone Encounter (Signed)
Patient dropped off [facility] forms for renewal. Forms are in [employer].

## 2020-07-19 ENCOUNTER — Other Ambulatory Visit: Payer: Self-pay | Admitting: Internal Medicine

## 2020-08-02 ENCOUNTER — Other Ambulatory Visit: Payer: Self-pay | Admitting: Family Medicine

## 2020-08-02 DIAGNOSIS — B0222 Postherpetic trigeminal neuralgia: Secondary | ICD-10-CM

## 2020-08-02 DIAGNOSIS — M792 Neuralgia and neuritis, unspecified: Secondary | ICD-10-CM

## 2020-08-08 ENCOUNTER — Other Ambulatory Visit: Payer: Self-pay | Admitting: Family Medicine

## 2020-08-08 DIAGNOSIS — M7918 Myalgia, other site: Secondary | ICD-10-CM

## 2020-08-13 ENCOUNTER — Telehealth: Payer: Self-pay

## 2020-08-13 NOTE — Telephone Encounter (Signed)
[facility] sent a enrollment notification stating that the patient meets program eligibility requirements and is enrolled in [place] until the end of the calendar year. Tammy Acosta,cma

## 2020-08-19 ENCOUNTER — Other Ambulatory Visit: Payer: Self-pay | Admitting: Internal Medicine

## 2020-09-10 ENCOUNTER — Other Ambulatory Visit: Payer: Self-pay | Admitting: Family Medicine

## 2020-09-10 DIAGNOSIS — M7918 Myalgia, other site: Secondary | ICD-10-CM

## 2020-09-11 ENCOUNTER — Other Ambulatory Visit: Payer: Self-pay | Admitting: Family Medicine

## 2020-09-16 ENCOUNTER — Other Ambulatory Visit: Payer: Self-pay | Admitting: Internal Medicine

## 2020-10-09 ENCOUNTER — Other Ambulatory Visit: Payer: Self-pay | Admitting: Family Medicine

## 2020-10-09 DIAGNOSIS — M7918 Myalgia, other site: Secondary | ICD-10-CM

## 2020-10-19 ENCOUNTER — Other Ambulatory Visit: Payer: Self-pay | Admitting: Internal Medicine

## 2020-10-22 ENCOUNTER — Other Ambulatory Visit: Payer: Self-pay | Admitting: Internal Medicine

## 2020-11-05 ENCOUNTER — Ambulatory Visit (INDEPENDENT_AMBULATORY_CARE_PROVIDER_SITE_OTHER): Payer: Medicare Other

## 2020-11-05 ENCOUNTER — Other Ambulatory Visit: Payer: Self-pay | Admitting: Family Medicine

## 2020-11-05 VITALS — Ht 62.0 in | Wt 172.0 lb

## 2020-11-05 DIAGNOSIS — Z0000 Encounter for general adult medical examination without abnormal findings: Secondary | ICD-10-CM

## 2020-11-05 NOTE — Patient Instructions (Addendum)
Tammy Acosta , Thank you for taking time to come for your Medicare Wellness Visit. I appreciate your ongoing commitment to your health goals. Please review the following plan we discussed and let me know if I can assist you in the future.   These are the goals we discussed: Goals      Patient Stated   .  Weight (lb) < 150 lb (68 kg) (pt-stated)      Goal 145lb Stay as active as possible       This is a list of the screening recommended for you and due dates:  Health Maintenance  Topic Date Due  . HIV Screening  Never done  . Colon Cancer Screening  12/12/2012  . Complete foot exam   10/15/2018  . Hemoglobin A1C  05/30/2020  . Flu Shot  12/22/2020*  . Mammogram  11/05/2021*  . Eye exam for diabetics  01/17/2021  . Tetanus Vaccine  02/04/2024  . DEXA scan (bone density measurement)  Completed  .  Hepatitis C: One time screening is recommended by Center for Disease Control  (CDC) for  adults born from [birth year] through 1966.   Completed  . HPV Vaccine  Aged Out  . Pap Smear  Discontinued  . COVID-19 Vaccine  Discontinued  . Pneumonia vaccines  Discontinued  *Topic was postponed. The date shown is not the original due date.    Immunizations Immunization History  Administered Date(s) Administered  . Influenza,inj,Quad PF,6+ Mos 10/30/2015, 07/01/2017, 07/15/2018  . Pneumococcal Polysaccharide-23 10/30/2015  . Tdap 02/03/2014   Keep all routine maintenance appointments.   Follow up 12/09/20 @ 4:00  Advanced directives: not yet completed.   Follow up in one year for your annual wellness visit.   Preventive Care 38 Years and Older, Female Preventive care refers to lifestyle choices and visits with your health care provider that can promote health and wellness. What does preventive care include?  A yearly physical exam. This is also called an annual well check.  Dental exams once or twice a year.  Routine eye exams. Ask your health care provider how often you should have  your eyes checked.  Personal lifestyle choices, including:  Daily care of your teeth and gums.  Regular physical activity.  Eating a healthy diet.  Avoiding tobacco and drug use.  Limiting alcohol use.  Practicing safe sex.  Taking low-dose aspirin every day.  Taking vitamin and mineral supplements as recommended by your health care provider. What happens during an annual well check? The services and screenings done by your health care provider during your annual well check will depend on your age, overall health, lifestyle risk factors, and family history of disease. Counseling  Your health care provider may ask you questions about your:  Alcohol use.  Tobacco use.  Drug use.  Emotional well-being.  Home and relationship well-being.  Sexual activity.  Eating habits.  History of falls.  Memory and ability to understand (cognition).  Work and work Astronomer.  Reproductive health. Screening  You may have the following tests or measurements:  Height, weight, and BMI.  Blood pressure.  Lipid and cholesterol levels. These may be checked every 5 years, or more frequently if you are over 40 years old.  Skin check.  Lung cancer screening. You may have this screening every year starting at age 65 if you have a 30-pack-year history of smoking and currently smoke or have quit within the past 15 years.  Fecal occult blood test (FOBT) of the stool.  You may have this test every year starting at age 41.  Flexible sigmoidoscopy or colonoscopy. You may have a sigmoidoscopy every 5 years or a colonoscopy every 10 years starting at age 37.  Hepatitis C blood test.  Hepatitis B blood test.  Sexually transmitted disease (STD) testing.  Diabetes screening. This is done by checking your blood sugar (glucose) after you have not eaten for a while (fasting). You may have this done every 1-3 years.  Bone density scan. This is done to screen for osteoporosis. You may have  this done starting at age 71.  Mammogram. This may be done every 1-2 years. Talk to your health care provider about how often you should have regular mammograms. Talk with your health care provider about your test results, treatment options, and if necessary, the need for more tests. Vaccines  Your health care provider may recommend certain vaccines, such as:  Influenza vaccine. This is recommended every year.  Tetanus, diphtheria, and acellular pertussis (Tdap, Td) vaccine. You may need a Td booster every 10 years.  Zoster vaccine. You may need this after age 42.  Pneumococcal 13-valent conjugate (PCV13) vaccine. One dose is recommended after age 9.  Pneumococcal polysaccharide (PPSV23) vaccine. One dose is recommended after age 71. Talk to your health care provider about which screenings and vaccines you need and how often you need them. This information is not intended to replace advice given to you by your health care provider. Make sure you discuss any questions you have with your health care provider. Document Released: 09/06/2015 Document Revised: 04/29/2016 Document Reviewed: 06/11/2015 Elsevier Interactive Patient Education  2017 [employer].  Fall Prevention in the Home Falls can cause injuries. They can happen to people of all ages. There are many things you can do to make your home safe and to help prevent falls. What can I do on the outside of my home?  Regularly fix the edges of walkways and driveways and fix any cracks.  Remove anything that might make you trip as you walk through a door, such as a raised step or threshold.  Trim any bushes or trees on the path to your home.  Use bright outdoor lighting.  Clear any walking paths of anything that might make someone trip, such as rocks or tools.  Regularly check to see if handrails are loose or broken. Make sure that both sides of any steps have handrails.  Any raised decks and porches should have guardrails on  the edges.  Have any leaves, snow, or ice cleared regularly.  Use sand or salt on walking paths during winter.  Clean up any spills in your garage right away. This includes oil or grease spills. What can I do in the bathroom?  Use night lights.  Install grab bars by the toilet and in the tub and shower. Do not use towel bars as grab bars.  Use non-skid mats or decals in the tub or shower.  If you need to sit down in the shower, use a plastic, non-slip stool.  Keep the floor dry. Clean up any water that spills on the floor as soon as it happens.  Remove soap buildup in the tub or shower regularly.  Attach bath mats securely with double-sided non-slip rug tape.  Do not have throw rugs and other things on the floor that can make you trip. What can I do in the bedroom?  Use night lights.  Make sure that you have a light by your bed that  is easy to reach.  Do not use any sheets or blankets that are too big for your bed. They should not hang down onto the floor.  Have a firm chair that has side arms. You can use this for support while you get dressed.  Do not have throw rugs and other things on the floor that can make you trip. What can I do in the kitchen?  Clean up any spills right away.  Avoid walking on wet floors.  Keep items that you use a lot in easy-to-reach places.  If you need to reach something above you, use a strong step stool that has a grab bar.  Keep electrical cords out of the way.  Do not use floor polish or wax that makes floors slippery. If you must use wax, use non-skid floor wax.  Do not have throw rugs and other things on the floor that can make you trip. What can I do with my stairs?  Do not leave any items on the stairs.  Make sure that there are handrails on both sides of the stairs and use them. Fix handrails that are broken or loose. Make sure that handrails are as long as the stairways.  Check any carpeting to make sure that it is firmly  attached to the stairs. Fix any carpet that is loose or worn.  Avoid having throw rugs at the top or bottom of the stairs. If you do have throw rugs, attach them to the floor with carpet tape.  Make sure that you have a light switch at the top of the stairs and the bottom of the stairs. If you do not have them, ask someone to add them for you. What else can I do to help prevent falls?  Wear shoes that:  Do not have high heels.  Have rubber bottoms.  Are comfortable and fit you well.  Are closed at the toe. Do not wear sandals.  If you use a stepladder:  Make sure that it is fully opened. Do not climb a closed stepladder.  Make sure that both sides of the stepladder are locked into place.  Ask someone to hold it for you, if possible.  Clearly mark and make sure that you can see:  Any grab bars or handrails.  First and last steps.  Where the edge of each step is.  Use tools that help you move around (mobility aids) if they are needed. These include:  Canes.  Walkers.  Scooters.  Crutches.  Turn on the lights when you go into a dark area. Replace any light bulbs as soon as they burn out.  Set up your furniture so you have a clear path. Avoid moving your furniture around.  If any of your floors are uneven, fix them.  If there are any pets around you, be aware of where they are.  Review your medicines with your doctor. Some medicines can make you feel dizzy. This can increase your chance of falling. Ask your doctor what other things that you can do to help prevent falls. This information is not intended to replace advice given to you by your health care provider. Make sure you discuss any questions you have with your health care provider. Document Released: 06/06/2009 Document Revised: 01/16/2016 Document Reviewed: 09/14/2014 Elsevier Interactive Patient Education  2017 [employer].

## 2020-11-05 NOTE — Progress Notes (Signed)
Subjective:   Tammy Acosta is a 65 y.o. female who presents for Medicare Annual (Subsequent) preventive examination.  Review of Systems    No ROS.  Medicare Wellness Virtual Visit.    Cardiac Risk Factors include: advanced age (>63mn, >>87women)     Objective:    Today's Vitals   11/05/20 1304  Weight: 172 lb (78 kg)  Height: 5' 2"  (1.575 m)   Body mass index is 31.46 kg/m.  Advanced Directives 11/05/2020 11/03/2019 11/02/2018 11/06/2016 09/24/2016 06/24/2016 03/30/2016  Does Patient Have a Medical Advance Directive? No No No No No No No  Does patient want to make changes to medical advance directive? - - No - Patient declined - - - -  Would patient like information on creating a medical advance directive? No - Patient declined No - Patient declined - Yes (MAU/Ambulatory/Procedural Areas - Information given) No - Patient declined - No - patient declined information    Current Medications (verified) Outpatient Encounter Medications as of 11/05/2020  Medication Sig  . amLODipine (NORVASC) 5 MG tablet Take 1 tablet by mouth once daily  . atorvastatin (LIPITOR) 80 MG tablet Take 1 tablet by mouth once daily  . blood glucose meter kit and supplies Dispense One touch meter.  E11.9  . Blood Glucose Monitoring Suppl (ONE TOUCH ULTRA 2) w/Device KIT Dispense 1 meter to use to test blood glucose once daily. Dx code: E11.42.  . ezetimibe (ZETIA) 10 MG tablet Take 1 tablet (10 mg total) by mouth daily.  . fluticasone (FLONASE) 50 MCG/ACT nasal spray Place 2 sprays into both nostrils daily.  .Marland Kitchengabapentin (NEURONTIN) 800 MG tablet Take 1 tablet by mouth 4 times daily  . hydrochlorothiazide (HYDRODIURIL) 25 MG tablet Take 1 tablet by mouth once daily  . ibuprofen (ADVIL,MOTRIN) 800 MG tablet Take 800 mg by mouth every 8 (eight) hours as needed. for pain  . Icosapent Ethyl (VASCEPA) 1 g CAPS Take 2 capsules (2 g total) by mouth 2 (two) times daily.  . Insulin Glargine (BASAGLAR KWIKPEN) 100  UNIT/ML Inject 0.52 mLs (52 Units total) into the skin daily.  . Insulin Pen Needle (PEN NEEDLES) 32G X 5 MM MISC 1 pen by Does not apply route daily. Use to inject insulin daily  . Lancets (ONETOUCH DELICA PLUS [id number] MISC USE   TO CHECK GLUCOSE UP TO THREE TIMES DAILY AS DIRECTED  . lisinopril (ZESTRIL) 40 MG tablet Take 1 tablet by mouth once daily  . loratadine (CLARITIN) 10 MG tablet Take 1 tablet (10 mg total) by mouth daily.  . metFORMIN (GLUCOPHAGE) 1000 MG tablet TAKE 1 TABLET BY MOUTH TWICE DAILY WITH A MEAL  . naproxen sodium (ALEVE) 220 MG tablet Take 220 mg by mouth 2 (two) times daily.  . nitroGLYCERIN (NITROSTAT) 0.4 MG SL tablet Place 1 tablet (0.4 mg total) under the tongue every 5 (five) minutes as needed for chest pain.  .Glory RosebushULTRA test strip USE 1 STRIP TO CHECK GLUCOSE UP TO THREE TIMES DAILY AS DIRECTED.  . Oxcarbazepine (TRILEPTAL) 300 MG tablet Take 1 tablet by mouth twice daily  . predniSONE (DELTASONE) 20 MG tablet Take 2 tablets (40 mg total) by mouth daily with breakfast.  . sertraline (ZOLOFT) 100 MG tablet Take 1 tablet by mouth once daily  . tiZANidine (ZANAFLEX) 4 MG tablet TAKE 1 TABLET BY MOUTH EVERY 8 HOURS AS NEEDED FOR MUSCLE SPASM   No facility-administered encounter medications on file as of 11/05/2020.    Allergies (  verified) Penicillins and Tetracyclines & related   History: Past Medical History:  Diagnosis Date  . Altered mental status 02/04/2014  . Arthritis   . Colon polyps   . Depression   . Diabetes mellitus without complication ([place])    Pt states she takes Insulin and metformin.  Marland Kitchen Hypercholesteremia   . Hyperlipidemia   . Hypertension   . Shingles    10/17  . Spine disorder   . Trigeminal herpes zoster (left V1 distribution) 06/24/2016  . Viral upper respiratory illness 12/03/2015   Past Surgical History:  Procedure Laterality Date  . ABDOMINAL HYSTERECTOMY    . BACK SURGERY    . CHOLECYSTECTOMY    . KYPHOPLASTY      Family History  Problem Relation Age of Onset  . Heart disease Mother   . Hypertension Mother   . Cancer Mother        breast  . Breast cancer Mother        early 67's  . Heart disease Father   . Arthritis Other        Parent, other relative  . Colon cancer Other        Parent  . Lung cancer Other        Other relative  . Diabetes Other        Other relative  . Uterine cancer Other        Other relative  . Cancer Maternal Aunt        breast  . Breast cancer Maternal Aunt   . Cancer Maternal Aunt        ovarian  . Breast cancer Cousin   . Breast cancer Cousin   . Breast cancer Cousin    Social History   Socioeconomic History  . Marital status: Divorced    Spouse name: Not on file  . Number of children: Not on file  . Years of education: Not on file  . Highest education level: Not on file  Occupational History  . Not on file  Tobacco Use  . Smoking status: Current Some Day Smoker    Packs/day: 0.00    Years: 40.00    Pack years: 0.00    Types: Cigarettes  . Smokeless tobacco: Never Used  Substance and Sexual Activity  . Alcohol use: No  . Drug use: No  . Sexual activity: Never  Other Topics Concern  . Not on file  Social History Narrative  . Not on file   Social Determinants of Health   Financial Resource Strain: Low Risk   . Difficulty of Paying Living Expenses: Not hard at all  Food Insecurity: No Food Insecurity  . Worried About Charity fundraiser in the Last Year: Never true  . Ran Out of Food in the Last Year: Never true  Transportation Needs: No Transportation Needs  . Lack of Transportation (Medical): No  . Lack of Transportation (Non-Medical): No  Physical Activity: Unknown  . Days of Exercise per Week: 0 days  . Minutes of Exercise per Session: Not on file  Stress: No Stress Concern Present  . Feeling of Stress : Not at all  Social Connections: Unknown  . Frequency of Communication with Friends and Family: More than three times a week   . Frequency of Social Gatherings with Friends and Family: More than three times a week  . Attends Religious Services: Not on file  . Active Member of Clubs or Organizations: Not on file  . Attends Archivist  Meetings: Not on file  . Marital Status: Not on file    Tobacco Counseling Ready to quit: Not Answered Counseling given: Not Answered   Clinical Intake:  Pre-visit preparation completed: Yes        Diabetes: Yes (Followed by pcp)  How often do you need to have someone help you when you read instructions, pamphlets, or other written materials from your doctor or pharmacy?: 1 - Never  Nutrition Risk Assessment: Has the patient had any N/V/D within the last 2 months?  No  Does the patient have any non-healing wounds?  No  Has the patient had any unintentional weight loss or weight gain?  No   Diabetes: If diabetic, was a CBG obtained today?  Yes , FBS 83 Did the patient bring in their glucometer from home?  No  How often do you monitor your CBG's? Daily.   Financial Strains and Diabetes Management: Are you having any financial strains with the device, your supplies or your medication? No .  Does the patient want to be seen by Chronic Care Management for management of their diabetes?  No  Would the patient like to be referred to a Nutritionist or for Diabetic Management?  No   Diabetic foot exam- followed by pcp. Notes no change, wounds, numbness, tingling in feet.   Interpreter Needed?: No      Activities of Daily Living In your present state of health, do you have any difficulty performing the following activities: 11/05/2020  Hearing? N  Vision? N  Difficulty concentrating or making decisions? N  Walking or climbing stairs? N  Dressing or bathing? N  Doing errands, shopping? N  Preparing Food and eating ? N  Using the Toilet? N  In the past six months, have you accidently leaked urine? N  Do you have problems with loss of bowel control? N   Managing your Medications? N  Managing your Finances? N  Housekeeping or managing your Housekeeping? N  Some recent data might be hidden    Patient Care Team: Leone Haven, MD as PCP - General (Family Medicine)  Indicate any recent Medical Services you may have received from other than [facility] providers in the past year (date may be approximate).     Assessment:   This is a routine wellness examination for Tammy Acosta.  I connected with Tammy Acosta today by telephone and verified that I am speaking with the correct person using two identifiers. Location patient: home Location provider: work Persons participating in the virtual visit: patient, Marine scientist.    I discussed the limitations, risks, security and privacy concerns of performing an evaluation and management service by telephone and the availability of in person appointments. The patient expressed understanding and verbally consented to this telephonic visit.    Interactive audio and video telecommunications were attempted between this provider and patient, however failed, due to patient having technical difficulties OR patient did not have access to video capability.  We continued and completed visit with audio only.  Some vital signs may be absent or patient reported.   Hearing/Vision screen  Hearing Screening   125Hz  250Hz  500Hz  1000Hz  2000Hz  3000Hz  4000Hz  6000Hz  8000Hz   Right ear:           Left ear:           Comments: Patient is able to hear conversational tones without difficulty. No issues reported.   Vision Screening Comments: Wears corrective lenses Visual acuity not assessed, virtual visit. They have seen their ophthalmologist.  Dietary issues and exercise activities discussed: Current Exercise Habits: Home exercise routine, Type of exercise: walking, Intensity: Mild  Healthy diet Good water intake  Goals      Patient Stated   .  Weight (lb) < 150 lb (68 kg) (pt-stated)      Goal 145lb Stay as active as  possible      Depression Screen PHQ 2/9 Scores 11/05/2020 11/03/2019 06/21/2019 11/02/2018 07/15/2018 11/06/2016 09/24/2016  PHQ - 2 Score 1 1 0 0 0 0 0  PHQ- 9 Score - - - - - - -  Exception Documentation - - - - - - -    Fall Risk Fall Risk  11/05/2020 11/03/2019 06/21/2019 11/02/2018 11/06/2016  Falls in the past year? 0 1 0 0 Yes  Number falls in past yr: 0 0 0 - 1  Injury with Fall? 0 0 - - -  Comment - Missed a step, lost her balance and fell down the stairs. She did not seek medical care. - - -  Risk for fall due to : - - - - (No Data)  Risk for fall due to: Comment - - - - L leg weakness  Follow up Falls evaluation completed Falls evaluation completed;Falls prevention discussed;Education provided Falls evaluation completed - Falls prevention discussed    FALL RISK PREVENTION PERTAINING TO THE HOME: Handrails in use when climbing stairs? Yes Home free of loose throw rugs in walkways, pet beds, electrical cords, etc? Yes  Adequate lighting in your home to reduce risk of falls? Yes   ASSISTIVE DEVICES UTILIZED TO PREVENT FALLS: Life alert? No  Use of a cane, walker or w/c? No   TIMED UP AND GO: Was the test performed? No . Virtual visit.   Cognitive Function: Patient is alert and oriented x3.  Denies difficulty focusing, making decisions, memory loss.  MMSE/6CIT deferred per patient preference.  MMSE - Mini Mental State Exam 11/06/2016  Orientation to time 5  Orientation to Place 5  Registration 3  Attention/ Calculation 5  Recall 3  Language- name 2 objects 2  Language- repeat 1  Language- follow 3 step command 3  Language- read & follow direction 1  Write a sentence 1  Copy design 1  Total score 30     6CIT Screen 11/03/2019 11/02/2018  What Year? 0 points 0 points  What month? 0 points 0 points  What time? 0 points 0 points  Count back from 20 0 points 0 points  Months in reverse 0 points 0 points  Repeat phrase 0 points 0 points  Total Score 0 0     Immunizations Immunization History  Administered Date(s) Administered  . Influenza,inj,Quad PF,6+ Mos 10/30/2015, 07/01/2017, 07/15/2018  . Pneumococcal Polysaccharide-23 10/30/2015  . Tdap 02/03/2014   Tdap, PNA, Covid vaccines- discontinued per patient preference.   Health Maintenance Health Maintenance  Topic Date Due  . HIV Screening  Never done  . COLONOSCOPY (Pts 45-13yrs Insurance coverage will need to be confirmed)  12/12/2012  . FOOT EXAM  10/15/2018  . HEMOGLOBIN A1C  05/30/2020  . INFLUENZA VACCINE  12/22/2020 (Originally 03/24/2020)  . MAMMOGRAM  11/05/2021 (Originally 01/26/2020)  . OPHTHALMOLOGY EXAM  01/17/2021  . TETANUS/TDAP  02/04/2024  . DEXA SCAN  Completed  . Hepatitis C Screening  Completed  . HPV VACCINES  Aged Out  . PAP SMEAR-Modifier  Discontinued  . COVID-19 Vaccine  Discontinued  . PNA vac Low Risk Adult  Discontinued   Colorectal cancer screening: Type of  screening: Colonoscopy. Completed 12/13/02. Repeat every 10 years. Declined. Plans to schedule later in the season.   Mammogram- declined. Plans to schedule later in the season.  Dental Screening: Recommended annual dental exams for proper oral hygiene.  Community Resource Referral / Chronic Care Management: CRR required this visit?  No   CCM required this visit?  No      Plan:   Keep all routine maintenance appointments.   Follow up 12/09/20 @ 4:00  I have personally reviewed and noted the following in the patient's chart:   . Medical and social history . Use of alcohol, tobacco or illicit drugs  . Current medications and supplements . Functional ability and status . Nutritional status . Physical activity . Advanced directives . List of other physicians . Hospitalizations, surgeries, and ER visits in previous 12 months . Vitals . Screenings to include cognitive, depression, and falls . Referrals and appointments  In addition, I have reviewed and discussed with patient certain  preventive protocols, quality metrics, and best practice recommendations. A written personalized care plan for preventive services as well as general preventive health recommendations were provided to patient via mychart.     Varney Biles, LPN   [id number]

## 2020-11-06 ENCOUNTER — Other Ambulatory Visit: Payer: Self-pay | Admitting: Family Medicine

## 2020-11-06 DIAGNOSIS — M7918 Myalgia, other site: Secondary | ICD-10-CM

## 2020-11-07 ENCOUNTER — Other Ambulatory Visit: Payer: Self-pay | Admitting: Family Medicine

## 2020-11-07 DIAGNOSIS — M7918 Myalgia, other site: Secondary | ICD-10-CM

## 2020-11-11 ENCOUNTER — Other Ambulatory Visit: Payer: Self-pay

## 2020-11-13 ENCOUNTER — Other Ambulatory Visit: Payer: Self-pay

## 2020-11-13 ENCOUNTER — Encounter: Payer: Self-pay | Admitting: Family Medicine

## 2020-11-13 ENCOUNTER — Ambulatory Visit (INDEPENDENT_AMBULATORY_CARE_PROVIDER_SITE_OTHER): Payer: Medicare Other | Admitting: Family Medicine

## 2020-11-13 ENCOUNTER — Ambulatory Visit (INDEPENDENT_AMBULATORY_CARE_PROVIDER_SITE_OTHER): Payer: Medicare Other

## 2020-11-13 DIAGNOSIS — I10 Essential (primary) hypertension: Secondary | ICD-10-CM | POA: Diagnosis not present

## 2020-11-13 DIAGNOSIS — E1142 Type 2 diabetes mellitus with diabetic polyneuropathy: Secondary | ICD-10-CM

## 2020-11-13 DIAGNOSIS — G500 Trigeminal neuralgia: Secondary | ICD-10-CM | POA: Diagnosis not present

## 2020-11-13 DIAGNOSIS — I700 Atherosclerosis of aorta: Secondary | ICD-10-CM | POA: Diagnosis not present

## 2020-11-13 DIAGNOSIS — M545 Low back pain, unspecified: Secondary | ICD-10-CM

## 2020-11-13 DIAGNOSIS — F17210 Nicotine dependence, cigarettes, uncomplicated: Secondary | ICD-10-CM

## 2020-11-13 LAB — COMPREHENSIVE METABOLIC PANEL
ALT: 9 U/L (ref 0–35)
AST: 12 U/L (ref 0–37)
Albumin: 4 g/dL (ref 3.5–5.2)
Alkaline Phosphatase: 80 U/L (ref 39–117)
BUN: 25 mg/dL — ABNORMAL HIGH (ref 6–23)
CO2: 27 mEq/L (ref 19–32)
Calcium: 9.1 mg/dL (ref 8.4–10.5)
Chloride: 102 mEq/L (ref 96–112)
Creatinine, Ser: 0.85 mg/dL (ref 0.40–1.20)
GFR: 71.76 mL/min (ref >60.00)
Glucose, Bld: 116 mg/dL — ABNORMAL HIGH (ref 70–99)
Potassium: 4.3 mEq/L (ref 3.5–5.1)
Sodium: 139 mEq/L (ref 135–145)
Total Bilirubin: 0.3 mg/dL (ref 0.2–1.2)
Total Protein: 6.6 g/dL (ref 6.0–8.3)

## 2020-11-13 LAB — LIPID PANEL
Cholesterol: 244 mg/dL — ABNORMAL HIGH (ref 0–200)
HDL: 43.5 mg/dL (ref >39.00)
NonHDL: 200.93
Total CHOL/HDL Ratio: 6
Triglycerides: 319 mg/dL — ABNORMAL HIGH (ref 0.0–149.0)
VLDL: 63.8 mg/dL — ABNORMAL HIGH (ref 0.0–40.0)

## 2020-11-13 LAB — LDL CHOLESTEROL, DIRECT: Direct LDL: 149 mg/dL

## 2020-11-13 LAB — HEMOGLOBIN A1C: Hgb A1c MFr Bld: 6.8 % — ABNORMAL HIGH (ref 4.6–6.5)

## 2020-11-13 NOTE — Assessment & Plan Note (Signed)
Smoking cessation counseling was provided.  Approximately 4 minutes were spent discussing the rationale for tobacco cessation and strategies for doing so.  Adjuncts, including nicotine patches, nicotine lozenges and varenicline were discussed.  The patient will contact the 1 800 quit now line to see about nicotine supplementation options.  Follow-up in 1 month.

## 2020-11-13 NOTE — Patient Instructions (Signed)
Nice to see you. You can try the tizanidine as a muscle relaxer. We will get an x-ray today. We will get lab work as well.

## 2020-11-13 NOTE — Assessment & Plan Note (Signed)
Continue risk factor management. 

## 2020-11-13 NOTE — Progress Notes (Signed)
Tammy Rumps, MD Phone: [phone]  Tammy Acosta is a 64 y.o. female who presents today for f/u.  HYPERTENSION  Disease Monitoring  Home BP Monitoring not checking her BP Chest pain- no    Dyspnea- no Medications  Compliance-  Taking amlodipine, HCTZ, lisinopril  Edema- no  DIABETES Disease Monitoring: Blood Sugar ranges-60s-230s Polyuria/phagia/dipsia- no      Optho- UTD Medications: Compliance- taking basaglar up to 38 u daily though does hold this in the morning if her glucose is low Hypoglycemic symptoms- occasionally in the am with glucose in the 60s  Trigeminal neuralgia: Patient notes this is well controlled on the Trileptal.  She has not had any headaches since she started on this.  Low back pain: Patient has acute on chronic low back pain.  No recent injury.  Symptoms started 3 to 4 weeks ago.  It is hard to function.  She has had surgery on her back about 8 years ago when she "broke her back.".  She notes no numbness.  She feels weak in her legs.  No incontinence.  Notes her back bothers her throughout the day and night.  She tried Aleve with no benefit.  Does radiate somewhat to her hamstrings though nothing further down her legs.  Does report a fall about 4 months ago though her back did not bother her at that time  Nicotine dependence, tobacco: Patient is smoking about half a pack a day.  She is interested in quitting.  She was on Wellbutrin previously though can no longer take this given her history of seizures.  She was on the patch when she was hospitalized previously and notes she did not want to smoke while using that.  Social History   Tobacco Use  Smoking Status Current Some Day Smoker  . Packs/day: 0.00  . Years: 40.00  . Pack years: 0.00  . Types: Cigarettes  Smokeless Tobacco Never Used    Current Outpatient Medications on File Prior to Visit  Medication Sig Dispense Refill  . amLODipine (NORVASC) 5 MG tablet Take 1 tablet by mouth once daily 90  tablet 0  . atorvastatin (LIPITOR) 80 MG tablet Take 1 tablet by mouth once daily 30 tablet 0  . blood glucose meter kit and supplies Dispense One touch meter.  E11.9 1 each 0  . Blood Glucose Monitoring Suppl (ONE TOUCH ULTRA 2) w/Device KIT Dispense 1 meter to use to test blood glucose once daily. Dx code: E11.42. 1 kit 0  . ezetimibe (ZETIA) 10 MG tablet Take 1 tablet (10 mg total) by mouth daily. 90 tablet 3  . fluticasone (FLONASE) 50 MCG/ACT nasal spray Place 2 sprays into both nostrils daily. 16 g 6  . gabapentin (NEURONTIN) 800 MG tablet Take 1 tablet by mouth 4 times daily 360 tablet 0  . hydrochlorothiazide (HYDRODIURIL) 25 MG tablet Take 1 tablet by mouth once daily 90 tablet 0  . ibuprofen (ADVIL,MOTRIN) 800 MG tablet Take 800 mg by mouth every 8 (eight) hours as needed. for pain    . Icosapent Ethyl (VASCEPA) 1 g CAPS Take 2 capsules (2 g total) by mouth 2 (two) times daily. 120 capsule 3  . Insulin Glargine (BASAGLAR KWIKPEN) 100 UNIT/ML Inject 0.52 mLs (52 Units total) into the skin daily. 60 mL 1  . Insulin Pen Needle (PEN NEEDLES) 32G X 5 MM MISC 1 pen by Does not apply route daily. Use to inject insulin daily 100 each 4  . Lancets (ONETOUCH DELICA PLUS ZJIRCV89F) [place]  USE   TO CHECK GLUCOSE UP TO THREE TIMES DAILY AS DIRECTED 100 each 1  . lisinopril (ZESTRIL) 40 MG tablet Take 1 tablet by mouth once daily 90 tablet 0  . loratadine (CLARITIN) 10 MG tablet Take 1 tablet (10 mg total) by mouth daily. 30 tablet 11  . metFORMIN (GLUCOPHAGE) 1000 MG tablet TAKE 1 TABLET BY MOUTH TWICE DAILY WITH A MEAL 180 tablet 0  . naproxen sodium (ALEVE) 220 MG tablet Take 220 mg by mouth 2 (two) times daily.    Glory Rosebush ULTRA test strip USE 1 STRIP TO CHECK GLUCOSE UP TO THREE TIMES DAILY AS DIRECTED. 100 each 1  . Oxcarbazepine (TRILEPTAL) 300 MG tablet Take 1 tablet by mouth twice daily 60 tablet 0  . sertraline (ZOLOFT) 100 MG tablet Take 1 tablet by mouth once daily 90 tablet 0  .  tiZANidine (ZANAFLEX) 4 MG tablet TAKE 1 TABLET BY MOUTH EVERY 8 HOURS AS NEEDED FOR MUSCLE SPASM 90 tablet 0  . nitroGLYCERIN (NITROSTAT) 0.4 MG SL tablet Place 1 tablet (0.4 mg total) under the tongue every 5 (five) minutes as needed for chest pain. (Patient not taking: Reported on 11/13/2020) 25 tablet 6  . predniSONE (DELTASONE) 20 MG tablet Take 2 tablets (40 mg total) by mouth daily with breakfast. (Patient not taking: Reported on 11/13/2020) 10 tablet 0   No current facility-administered medications on file prior to visit.     ROS see history of present illness  Objective  Physical Exam Vitals:   11/13/20 0911 11/13/20 0923  BP: 140/90 (!) 145/80  Pulse: 85   Temp: 98.8 F (37.1 C)   SpO2: 98%     BP Readings from Last 3 Encounters:  11/13/20 (!) 145/80  12/10/19 (!) 171/80  11/29/19 110/70   Wt Readings from Last 3 Encounters:  11/13/20 155 lb 12.8 oz (70.7 kg)  11/05/20 172 lb (78 kg)  12/10/19 172 lb (78 kg)    Physical Exam Constitutional:      General: She is not in acute distress.    Appearance: She is not diaphoretic.  Cardiovascular:     Rate and Rhythm: Normal rate and regular rhythm.     Heart sounds: Normal heart sounds.  Pulmonary:     Effort: Pulmonary effort is normal.     Breath sounds: Normal breath sounds.  Musculoskeletal:     Comments: She has some slight midline lumbar spine tenderness and lumbar muscular tenderness, otherwise no midline spine tenderness, no midline spine step-off, no overlying skin changes in the area of tenderness  Skin:    General: Skin is warm and dry.  Neurological:     Mental Status: She is alert.     Comments: 5/5 strength bilateral quads, hamstrings, plantar flexion, and dorsiflexion, sensation light touch intact bilateral lower extremities, 2+ patellar reflexes      Assessment/Plan: Please see individual problem list.  Problem List Items Addressed This Visit    Acute bilateral low back pain without sciatica     Patient with acute low back pain on top of her chronic low back pain.  Given the midline discomfort and discomfort throughout the day and night we will get an x-ray to evaluate for an underlying cause.  The patient just received a refill of tizanidine as a muscle relaxer.  Advised that this could make her drowsy.  Once I get her labs back I will determine what anti-inflammatory we can use.      Relevant Orders  DG Lumbar Spine Complete   Atherosclerosis of aorta (HCC)    Continue risk factor management.      Essential (primary) hypertension    Above goal though her pain may be playing a role in this.  She will continue amlodipine 5 mg once daily, HCTZ 25 mg daily, and lisinopril 40 mg daily.  Check labs.  We will not make any medication changes at this time given her pain level.  She will follow-up in about a month.      Relevant Orders   Comp Met (CMET)   Lipid panel   Nicotine dependence, cigarettes, uncomplicated    Smoking cessation counseling was provided.  Approximately 4 minutes were spent discussing the rationale for tobacco cessation and strategies for doing so.  Adjuncts, including nicotine patches, nicotine lozenges and varenicline were discussed.  The patient will contact the 1 800 quit now line to see about nicotine supplementation options.  Follow-up in 1 month.      Trigeminal neuralgia    Well-controlled.  She will continue Trileptal 300 mg twice daily.      Type 2 diabetes mellitus (HCC)    Check A1c.  We will alter her Basaglar regimen based off of her glucoses and her A1c level.  She will continue Metformin 1000 mg twice daily.      Relevant Orders   Lipid panel   HgB A1c       Health Maintenance: I encouraged the patient to get the Shingrix vaccine through the pharmacy.     This visit occurred during the SARS-CoV-2 public health emergency.  Safety protocols were in place, including screening questions prior to the visit, additional usage of staff PPE, and  extensive cleaning of exam room while observing appropriate contact time as indicated for disinfecting solutions.    Tammy Rumps, MD [place]

## 2020-11-13 NOTE — Assessment & Plan Note (Signed)
Above goal though her pain may be playing a role in this.  She will continue amlodipine 5 mg once daily, HCTZ 25 mg daily, and lisinopril 40 mg daily.  Check labs.  We will not make any medication changes at this time given her pain level.  She will follow-up in about a month.

## 2020-11-13 NOTE — Assessment & Plan Note (Signed)
Patient with acute low back pain on top of her chronic low back pain.  Given the midline discomfort and discomfort throughout the day and night we will get an x-ray to evaluate for an underlying cause.  The patient just received a refill of tizanidine as a muscle relaxer.  Advised that this could make her drowsy.  Once I get her labs back I will determine what anti-inflammatory we can use.

## 2020-11-13 NOTE — Assessment & Plan Note (Signed)
Well-controlled.  She will continue Trileptal 300 mg twice daily.

## 2020-11-13 NOTE — Assessment & Plan Note (Signed)
Check A1c.  We will alter her Basaglar regimen based off of her glucoses and her A1c level.  She will continue Metformin 1000 mg twice daily.

## 2020-11-15 ENCOUNTER — Telehealth: Payer: Self-pay

## 2020-11-15 DIAGNOSIS — E785 Hyperlipidemia, unspecified: Secondary | ICD-10-CM

## 2020-11-15 MED ORDER — PREDNISONE 20 MG PO TABS: 40.0000 mg | ORAL_TABLET | Freq: Every day | ORAL | 0 refills | Status: DC

## 2020-11-15 NOTE — Telephone Encounter (Signed)
Please let her know that her kidney function has improved.  Her cholesterol is poorly controlled.  Her A1c is well controlled.  We can try a short course of prednisone.  She needs to keep a very close eye on her blood sugars and if they go up above 300 she needs to discontinue the prednisone and let us know right away.  Please find out if she has been taking Lipitor or Zetia or Vascepa.  Thanks.

## 2020-11-15 NOTE — Telephone Encounter (Signed)
I called and spoke with the patient and informed her that the prednisone was sent and if her BS go over 300 to stop and she understood.  Patient stated she is taking her cholesterol medication Lipitor.  Nina,cma

## 2020-11-18 ENCOUNTER — Other Ambulatory Visit: Payer: Self-pay | Admitting: Family Medicine

## 2020-11-18 ENCOUNTER — Telehealth: Payer: Self-pay

## 2020-11-18 DIAGNOSIS — S22000A Wedge compression fracture of unspecified thoracic vertebra, initial encounter for closed fracture: Secondary | ICD-10-CM

## 2020-11-18 MED ORDER — ROSUVASTATIN CALCIUM 40 MG PO TABS
40.0000 mg | ORAL_TABLET | Freq: Every day | ORAL | 3 refills | Status: DC
Start: 2020-11-18 — End: 2021-11-24

## 2020-11-18 NOTE — Telephone Encounter (Signed)
-----   Message from Glori Luis, MD sent at 11/18/2020 12:16 PM EDT ----- Noted. She needs a vitamin D checked prior to starting on the fosamax. Order placed. Please get her scheduled for this.

## 2020-11-18 NOTE — Telephone Encounter (Signed)
Noted. Would she be willing to change her lipitor to crestor? Her LDL is not at goal and making this change may help.

## 2020-11-18 NOTE — Telephone Encounter (Signed)
Noted.  Crestor sent to pharmacy.  She will need follow-up labs in about 6 weeks.  Please get these scheduled.  Thanks.

## 2020-11-18 NOTE — Telephone Encounter (Signed)
I called the patient and she stated she was willing to change from Lipitor to Crestor and you can send it to the pharmacy.  Ashrith Sagan,cma

## 2020-11-18 NOTE — Telephone Encounter (Signed)
I called and spoke with the patient and scheduled her a lab appointment to check her Vitamin D.  Curlee Bogan,cma

## 2020-11-19 ENCOUNTER — Other Ambulatory Visit (INDEPENDENT_AMBULATORY_CARE_PROVIDER_SITE_OTHER): Payer: Medicare Other

## 2020-11-19 ENCOUNTER — Other Ambulatory Visit: Payer: Self-pay

## 2020-11-19 DIAGNOSIS — S22000A Wedge compression fracture of unspecified thoracic vertebra, initial encounter for closed fracture: Secondary | ICD-10-CM | POA: Diagnosis not present

## 2020-11-20 ENCOUNTER — Other Ambulatory Visit: Payer: Self-pay | Admitting: Family Medicine

## 2020-11-20 DIAGNOSIS — E559 Vitamin D deficiency, unspecified: Secondary | ICD-10-CM

## 2020-11-20 LAB — VITAMIN D 25 HYDROXY (VIT D DEFICIENCY, FRACTURES): VITD: 12.53 ng/mL — ABNORMAL LOW (ref 30.00–100.00)

## 2020-11-20 MED ORDER — VITAMIN D (ERGOCALCIFEROL) 1.25 MG (50000 UNIT) PO CAPS
50000.0000 [IU] | ORAL_CAPSULE | ORAL | 0 refills | Status: DC
Start: 2020-11-20 — End: 2022-02-23

## 2020-11-22 ENCOUNTER — Other Ambulatory Visit: Payer: Self-pay | Admitting: Family Medicine

## 2020-11-22 DIAGNOSIS — B0222 Postherpetic trigeminal neuralgia: Secondary | ICD-10-CM

## 2020-11-22 DIAGNOSIS — M792 Neuralgia and neuritis, unspecified: Secondary | ICD-10-CM

## 2020-12-03 ENCOUNTER — Telehealth: Payer: Self-pay | Admitting: Family Medicine

## 2020-12-03 NOTE — Telephone Encounter (Signed)
Pt called and wanted to know if a stronger muscle relaxer could be called in  She is still having back pain that is keeping her up at night

## 2020-12-03 NOTE — Telephone Encounter (Signed)
She would need a follow up on her back pain to determine if a different muscle relaxer is appropriate. It sounds like she may need to see a spine specialist at this point.

## 2020-12-03 NOTE — Telephone Encounter (Signed)
I called nad talked to the patient and she stated she will wait to discuss on 12/09/2020.  Shalanda Brogden,cma

## 2020-12-07 ENCOUNTER — Other Ambulatory Visit: Payer: Self-pay | Admitting: Family Medicine

## 2020-12-09 ENCOUNTER — Ambulatory Visit: Payer: Medicare Other | Admitting: Family Medicine

## 2020-12-15 ENCOUNTER — Other Ambulatory Visit: Payer: Self-pay | Admitting: Internal Medicine

## 2020-12-20 ENCOUNTER — Other Ambulatory Visit: Payer: Self-pay

## 2020-12-27 ENCOUNTER — Ambulatory Visit: Payer: Medicare Other | Admitting: Family Medicine

## 2020-12-31 ENCOUNTER — Other Ambulatory Visit: Payer: Self-pay | Admitting: Family Medicine

## 2021-01-09 ENCOUNTER — Other Ambulatory Visit: Payer: Self-pay | Admitting: Family Medicine

## 2021-01-09 DIAGNOSIS — M7918 Myalgia, other site: Secondary | ICD-10-CM

## 2021-01-17 ENCOUNTER — Other Ambulatory Visit: Payer: Self-pay | Admitting: Family Medicine

## 2021-01-25 ENCOUNTER — Other Ambulatory Visit: Payer: Self-pay | Admitting: Family Medicine

## 2021-01-27 ENCOUNTER — Other Ambulatory Visit: Payer: Medicare Other

## 2021-01-30 ENCOUNTER — Other Ambulatory Visit: Payer: Self-pay | Admitting: Family Medicine

## 2021-02-06 ENCOUNTER — Other Ambulatory Visit: Payer: Self-pay | Admitting: Family Medicine

## 2021-02-12 ENCOUNTER — Other Ambulatory Visit: Payer: Self-pay | Admitting: Family Medicine

## 2021-02-12 DIAGNOSIS — M7918 Myalgia, other site: Secondary | ICD-10-CM

## 2021-02-18 ENCOUNTER — Other Ambulatory Visit: Payer: Self-pay | Admitting: Family Medicine

## 2021-02-19 ENCOUNTER — Other Ambulatory Visit: Payer: Self-pay | Admitting: Family Medicine

## 2021-03-03 ENCOUNTER — Other Ambulatory Visit: Payer: Self-pay | Admitting: Family Medicine

## 2021-03-03 DIAGNOSIS — B0222 Postherpetic trigeminal neuralgia: Secondary | ICD-10-CM

## 2021-03-03 DIAGNOSIS — M792 Neuralgia and neuritis, unspecified: Secondary | ICD-10-CM

## 2021-03-14 ENCOUNTER — Other Ambulatory Visit: Payer: Self-pay | Admitting: Family Medicine

## 2021-03-14 DIAGNOSIS — M7918 Myalgia, other site: Secondary | ICD-10-CM

## 2021-03-18 ENCOUNTER — Other Ambulatory Visit: Payer: Self-pay | Admitting: Family Medicine

## 2021-03-18 DIAGNOSIS — M7918 Myalgia, other site: Secondary | ICD-10-CM

## 2021-03-18 NOTE — Telephone Encounter (Signed)
Pt called and states that she has been out of tiZANidine (ZANAFLEX) 4 MG tablet for a few days.

## 2021-03-24 ENCOUNTER — Other Ambulatory Visit: Payer: Self-pay | Admitting: Family Medicine

## 2021-03-25 ENCOUNTER — Telehealth: Payer: Self-pay | Admitting: Family Medicine

## 2021-03-25 NOTE — Telephone Encounter (Signed)
Patient is out of her Oxcarbazepine (TRILEPTAL) 300 MG tablet and needs this refilled asap. Thanks

## 2021-03-25 NOTE — Telephone Encounter (Signed)
Medication has been refilled.

## 2021-04-13 ENCOUNTER — Other Ambulatory Visit: Payer: Self-pay | Admitting: Family Medicine

## 2021-04-13 DIAGNOSIS — M7918 Myalgia, other site: Secondary | ICD-10-CM

## 2021-04-20 ENCOUNTER — Other Ambulatory Visit: Payer: Self-pay | Admitting: Family Medicine

## 2021-04-22 NOTE — Telephone Encounter (Signed)
30 day supply sent to pharmacy. Please try get her set up for follow up for further refills.

## 2021-04-22 NOTE — Telephone Encounter (Signed)
Last ov 3/22 last fill 03/25/2021 okay to fill can we give refills noticed being filled on month to  month basis.

## 2021-05-13 ENCOUNTER — Other Ambulatory Visit: Payer: Self-pay | Admitting: Family Medicine

## 2021-05-13 DIAGNOSIS — M7918 Myalgia, other site: Secondary | ICD-10-CM

## 2021-05-20 ENCOUNTER — Other Ambulatory Visit: Payer: Self-pay | Admitting: Family Medicine

## 2021-05-21 ENCOUNTER — Other Ambulatory Visit: Payer: Self-pay | Admitting: Family Medicine

## 2021-05-21 NOTE — Telephone Encounter (Signed)
Patient is out of Oxcarbazepine (TRILEPTAL) 300 MG tablet and was informed to not miss her medication by Dr.Sonnenberg. Please send to [facility] on [address].Patient would also like to get more than a month supply because she will have to call each one when she is out of medicine.Please advise.

## 2021-05-22 MED ORDER — OXCARBAZEPINE 300 MG PO TABS: ORAL_TABLET | ORAL | 0 refills | Status: DC

## 2021-05-22 NOTE — Telephone Encounter (Signed)
90-day refill sent in.  She can just be scheduled for the next available follow-up.  We can plan on labs at that time.

## 2021-05-22 NOTE — Telephone Encounter (Signed)
Patient called in requesting a 90 day supply of her Trileptal medication. Pt has not been seen since 11/13/20 and last labs were drawn on 11/13/20. Ok to refill medication for 90 days? Would you like for patient to be scheduled for repeat labs this month and a follow up at next available?

## 2021-05-23 ENCOUNTER — Telehealth: Payer: Self-pay

## 2021-05-23 NOTE — Telephone Encounter (Signed)
Called patient to schedule for the next available appointment. Mailbox was full, can not leave a message. 

## 2021-05-23 NOTE — Telephone Encounter (Signed)
Called patient to schedule for the next available appointment. Mailbox was full, can not leave a message.

## 2021-05-27 NOTE — Telephone Encounter (Signed)
Called patient to schedule for the next available appointment. Mailbox was full, can not leave a message. 

## 2021-05-29 NOTE — Telephone Encounter (Signed)
Sent a Wellsite geologist to [place] asking her to call our office and schedule an appointment with Dr. Birdie Sons.

## 2021-06-12 ENCOUNTER — Other Ambulatory Visit: Payer: Self-pay | Admitting: Family Medicine

## 2021-06-12 DIAGNOSIS — M7918 Myalgia, other site: Secondary | ICD-10-CM

## 2021-06-13 IMAGING — DX DG LUMBAR SPINE COMPLETE 4+V
5 series · 5 of 5 positions shown · non-contrast
Comparison: 11/29/2019

CLINICAL DATA: Acute on chronic low back pain.

EXAM:
LUMBAR SPINE - COMPLETE 4+ VIEW

[lumbar spine ap]
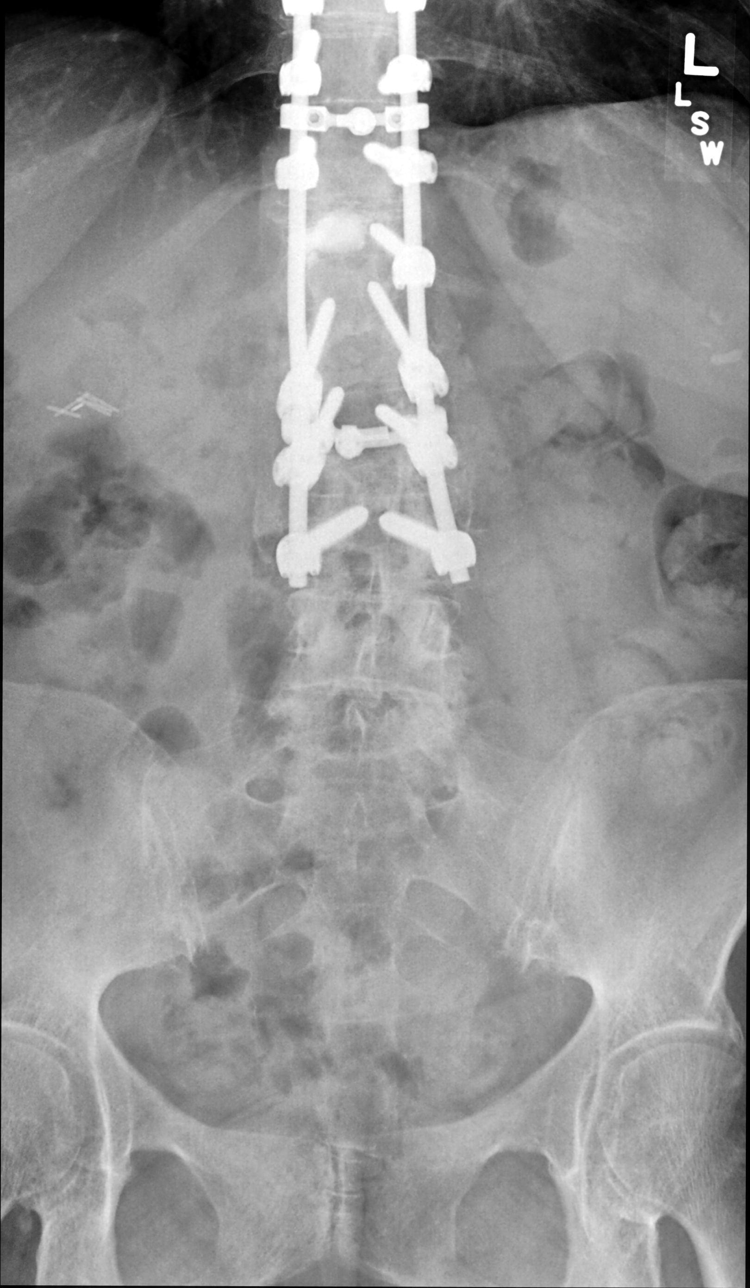

[lumbar spine obl (oblique) (1 of 2)]
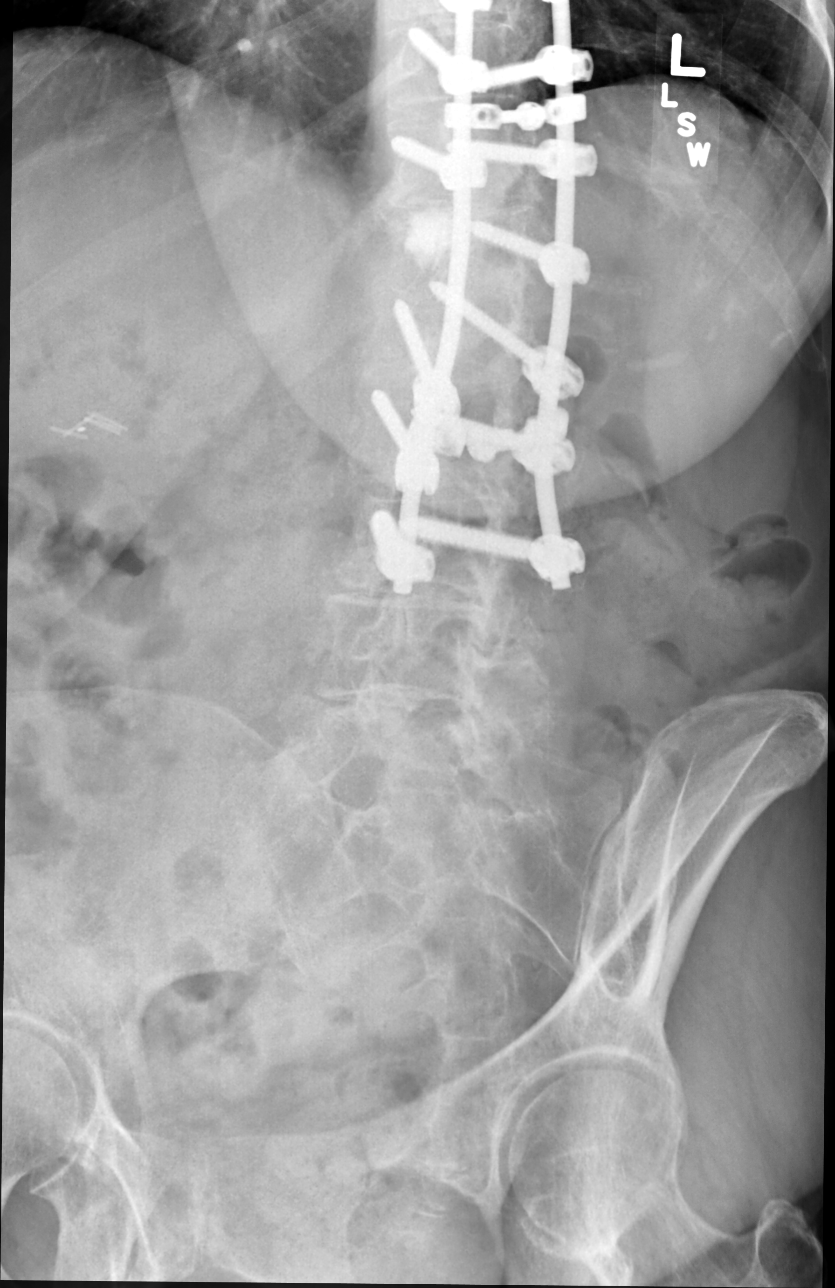

[lumbar spine obl (oblique) (2 of 2)]
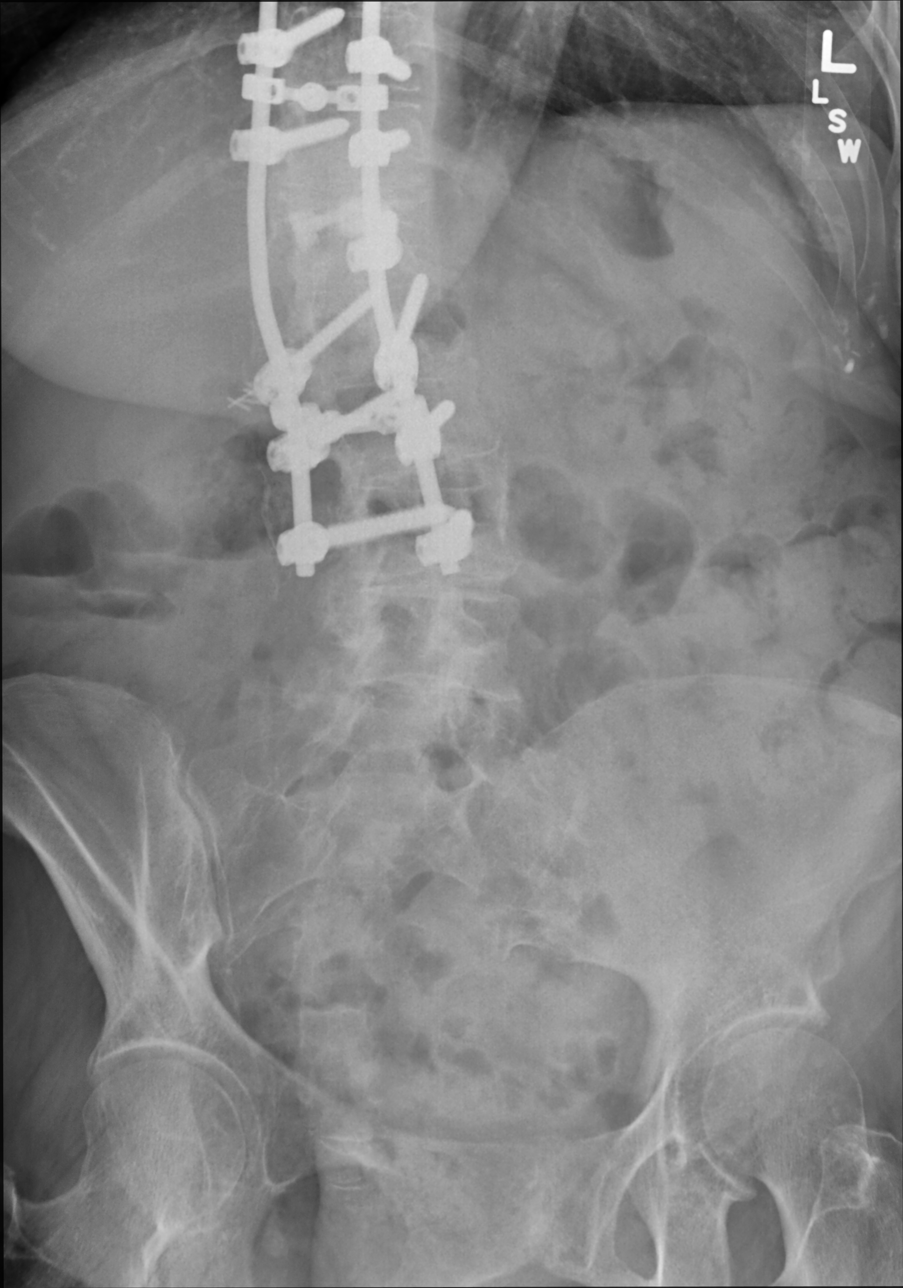

[lumbar spine lat]
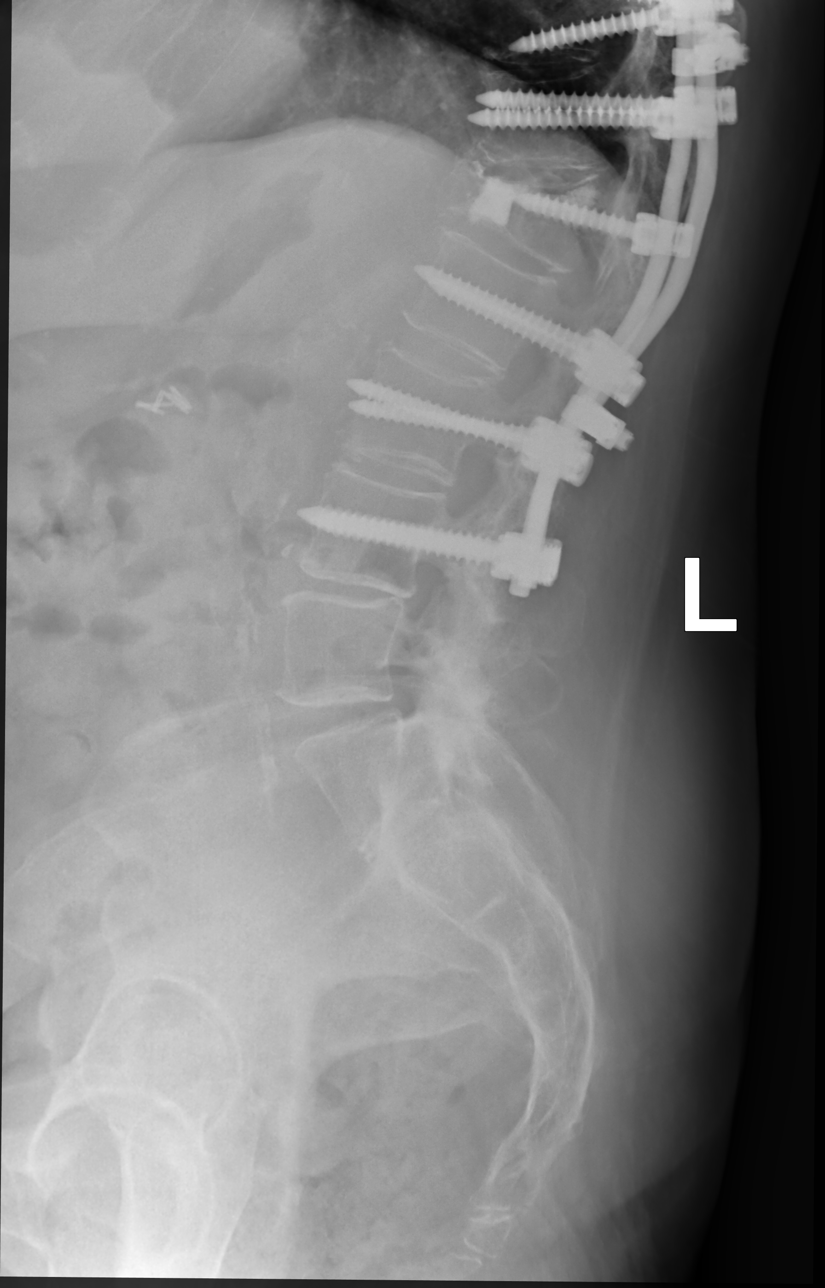

[lumbar spot lat]
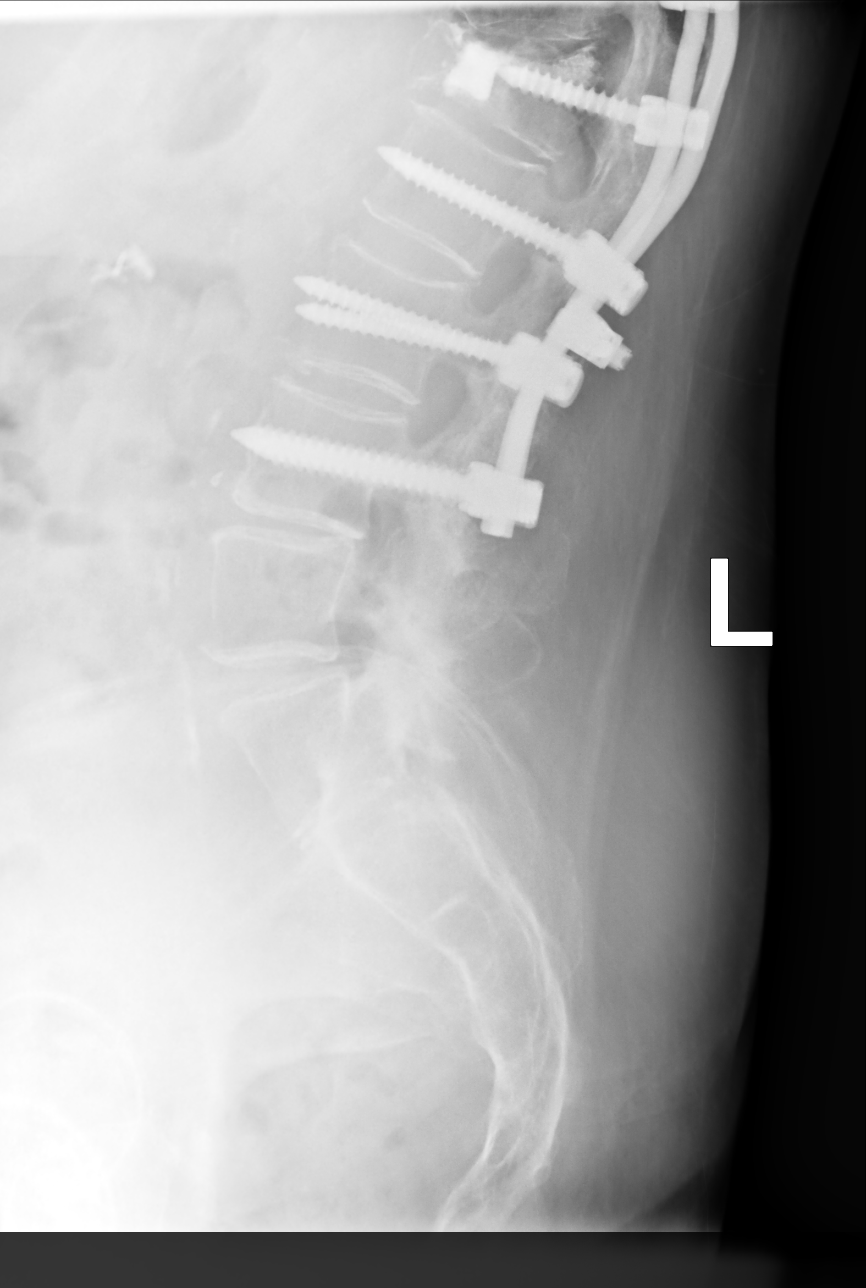

[5 of 5 positions shown; findings below may reference images not displayed]

FINDINGS: Extensive posterior thoracolumbar fusion hardware is again noted.
The hardware appears grossly intact. Again noted is a compression
fracture of the L1 vertebral body, similar to prior study. No new
compression fracture identified on today's study. No significant
malalignment. Multilevel disc height loss is noted throughout the
lumbar spine. There is facet arthrosis in the lower lumbar segments.
IMPRESSION: No acute displaced fracture or malalignment of the lumbar spine.

## 2021-06-20 ENCOUNTER — Other Ambulatory Visit: Payer: Self-pay | Admitting: Internal Medicine

## 2021-06-20 DIAGNOSIS — M792 Neuralgia and neuritis, unspecified: Secondary | ICD-10-CM

## 2021-06-20 DIAGNOSIS — B0222 Postherpetic trigeminal neuralgia: Secondary | ICD-10-CM

## 2021-06-22 ENCOUNTER — Other Ambulatory Visit: Payer: Self-pay | Admitting: Internal Medicine

## 2021-06-22 DIAGNOSIS — B0222 Postherpetic trigeminal neuralgia: Secondary | ICD-10-CM

## 2021-06-22 DIAGNOSIS — M792 Neuralgia and neuritis, unspecified: Secondary | ICD-10-CM

## 2021-07-14 ENCOUNTER — Other Ambulatory Visit: Payer: Self-pay | Admitting: Family Medicine

## 2021-07-14 DIAGNOSIS — M7918 Myalgia, other site: Secondary | ICD-10-CM

## 2021-08-13 ENCOUNTER — Other Ambulatory Visit: Payer: Self-pay | Admitting: Family Medicine

## 2021-08-13 DIAGNOSIS — M7918 Myalgia, other site: Secondary | ICD-10-CM

## 2021-08-15 ENCOUNTER — Other Ambulatory Visit: Payer: Self-pay | Admitting: Family Medicine

## 2021-08-20 ENCOUNTER — Other Ambulatory Visit: Payer: Self-pay | Admitting: Family Medicine

## 2021-09-10 ENCOUNTER — Other Ambulatory Visit: Payer: Self-pay | Admitting: Family Medicine

## 2021-09-10 DIAGNOSIS — M7918 Myalgia, other site: Secondary | ICD-10-CM

## 2021-09-18 ENCOUNTER — Other Ambulatory Visit: Payer: Self-pay | Admitting: Family Medicine

## 2021-09-18 DIAGNOSIS — I10 Essential (primary) hypertension: Secondary | ICD-10-CM

## 2021-09-19 ENCOUNTER — Telehealth: Payer: Self-pay | Admitting: Family Medicine

## 2021-09-19 NOTE — Telephone Encounter (Signed)
I called the patient and she is scheduled for a follow up in march.  Anam Bobby,cma

## 2021-09-19 NOTE — Telephone Encounter (Signed)
This patient is overdue for follow-up.  We received a refill request for her lisinopril.  The CMA sent this in for 90 days.  The patient will need to be seen for follow-up before this can be refilled again.  Please call her and get her scheduled.

## 2021-10-12 ENCOUNTER — Other Ambulatory Visit: Payer: Self-pay | Admitting: Family Medicine

## 2021-10-12 ENCOUNTER — Other Ambulatory Visit: Payer: Self-pay | Admitting: Family

## 2021-10-12 DIAGNOSIS — M7918 Myalgia, other site: Secondary | ICD-10-CM

## 2021-10-13 ENCOUNTER — Other Ambulatory Visit: Payer: Self-pay | Admitting: Family Medicine

## 2021-10-13 DIAGNOSIS — I10 Essential (primary) hypertension: Secondary | ICD-10-CM

## 2021-10-15 ENCOUNTER — Other Ambulatory Visit: Payer: Self-pay | Admitting: Family

## 2021-10-15 ENCOUNTER — Telehealth: Payer: Self-pay | Admitting: Family Medicine

## 2021-10-15 DIAGNOSIS — M7918 Myalgia, other site: Secondary | ICD-10-CM

## 2021-10-15 MED ORDER — TIZANIDINE HCL 4 MG PO TABS: ORAL_TABLET | ORAL | 0 refills | Status: DC

## 2021-10-15 NOTE — Addendum Note (Signed)
Addended by: Glori Luis on: 10/15/2021 04:07 PM   Modules accepted: Orders

## 2021-10-15 NOTE — Telephone Encounter (Signed)
Short-term refill provided.  The patient needs to keep her upcoming visit for further refills.

## 2021-10-15 NOTE — Telephone Encounter (Signed)
Patient called about her tiZANidine (ZANAFLEX) 4 MG tablet, and patient states she is out of this medication and is requesting a refill.

## 2021-10-16 NOTE — Telephone Encounter (Signed)
Mailbox full I could not leave a message informing the patient that her medication was filled  for usage p to her appointment and she must keep her appointment for further refills.  Djuan Talton,cma

## 2021-10-17 NOTE — Telephone Encounter (Signed)
I called the patient and informed the patient that her refills was a temporary supply and she could have more refills if she keeps her upcoming appointment and she understood. Oliver Neuwirth,cma

## 2021-10-21 ENCOUNTER — Other Ambulatory Visit: Payer: Self-pay | Admitting: Family Medicine

## 2021-10-21 DIAGNOSIS — E1142 Type 2 diabetes mellitus with diabetic polyneuropathy: Secondary | ICD-10-CM

## 2021-10-21 DIAGNOSIS — I10 Essential (primary) hypertension: Secondary | ICD-10-CM

## 2021-10-21 DIAGNOSIS — M7918 Myalgia, other site: Secondary | ICD-10-CM

## 2021-10-22 MED ORDER — LISINOPRIL 40 MG PO TABS: 40.0000 mg | ORAL_TABLET | Freq: Every day | ORAL | 0 refills | Status: DC

## 2021-10-22 MED ORDER — ONETOUCH ULTRA VI STRP: ORAL_STRIP | 1 refills | Status: DC

## 2021-10-22 MED ORDER — METFORMIN HCL 1000 MG PO TABS: 1000.0000 mg | ORAL_TABLET | Freq: Two times a day (BID) | ORAL | 0 refills | Status: DC

## 2021-10-22 MED ORDER — ONETOUCH DELICA PLUS LANCET33G MISC: 1 refills | Status: DC

## 2021-10-22 NOTE — Telephone Encounter (Signed)
Pt need a refill on metformin, lisinopril, tizanidine, one touch test strips and lancets sent to [facility] on [address]. Pt has changed pharmacy ?

## 2021-10-22 NOTE — Telephone Encounter (Signed)
I refilled the zanaflex one week ago. Please call her and see if she picked up that refill.  ?

## 2021-11-05 ENCOUNTER — Ambulatory Visit: Payer: Medicare Other | Admitting: Family Medicine

## 2021-11-06 ENCOUNTER — Telehealth: Payer: Self-pay

## 2021-11-06 ENCOUNTER — Ambulatory Visit: Payer: Medicare Other

## 2021-11-06 NOTE — Telephone Encounter (Signed)
Unable to reach patient for scheduled AWV. No voicemail. Reschedule as appropriate.  ?

## 2021-11-09 ENCOUNTER — Other Ambulatory Visit: Payer: Self-pay | Admitting: Family Medicine

## 2021-11-11 ENCOUNTER — Ambulatory Visit: Payer: Medicare Other | Admitting: Family Medicine

## 2021-11-24 ENCOUNTER — Telehealth: Payer: Self-pay | Admitting: Family Medicine

## 2021-11-24 ENCOUNTER — Other Ambulatory Visit: Payer: Self-pay

## 2021-11-24 DIAGNOSIS — E1142 Type 2 diabetes mellitus with diabetic polyneuropathy: Secondary | ICD-10-CM

## 2021-11-24 DIAGNOSIS — I10 Essential (primary) hypertension: Secondary | ICD-10-CM

## 2021-11-24 DIAGNOSIS — E785 Hyperlipidemia, unspecified: Secondary | ICD-10-CM

## 2021-11-24 MED ORDER — AMLODIPINE BESYLATE 5 MG PO TABS: 5.0000 mg | ORAL_TABLET | Freq: Every day | ORAL | 0 refills | Status: DC

## 2021-11-24 MED ORDER — ROSUVASTATIN CALCIUM 40 MG PO TABS: 40.0000 mg | ORAL_TABLET | Freq: Every day | ORAL | 3 refills | Status: DC

## 2021-11-24 MED ORDER — METFORMIN HCL 1000 MG PO TABS: 1000.0000 mg | ORAL_TABLET | Freq: Two times a day (BID) | ORAL | 0 refills | Status: DC

## 2021-11-24 NOTE — Telephone Encounter (Signed)
Patient has a new pharmacy, [facility], [address] [place] [address], [place]. She needs the following medication metFORMIN (GLUCOPHAGE) 1000 MG tablet, rosuvastatin (CRESTOR) 40 MG tablet, amLODipine (NORVASC) 5 MG tablet,  ?

## 2021-12-12 ENCOUNTER — Telehealth: Payer: Self-pay | Admitting: Family Medicine

## 2021-12-12 NOTE — Telephone Encounter (Signed)
Pt need refill on Oxcarbazepine sent to [facility]. ?

## 2021-12-15 MED ORDER — OXCARBAZEPINE 300 MG PO TABS: ORAL_TABLET | ORAL | 0 refills | Status: DC

## 2021-12-15 NOTE — Telephone Encounter (Signed)
Sent to pharmacy. Patient has an appointment scheduled on 12/26/21. She needs to keep this to get further refills.  ?

## 2021-12-17 ENCOUNTER — Telehealth: Payer: Self-pay | Admitting: Family Medicine

## 2021-12-17 NOTE — Telephone Encounter (Signed)
Pt called in requesting refill on medication (lisinopril (ZESTRIL) 40 MG tablet) and (sertraline (ZOLOFT) 100 MG tablet)... Pt stated that she have switched her pharmacy... Pt requesting callback  ?

## 2021-12-17 NOTE — Telephone Encounter (Signed)
Call could not be completed.  Gurdeep Keesey,cma  

## 2021-12-18 NOTE — Telephone Encounter (Signed)
The phone number is not right for this patient the operator keeps saying the call cannot be completed.  Tammy Acosta,cma  ?

## 2021-12-18 NOTE — Telephone Encounter (Signed)
I called the patients son and his VM is not setup.  Jakiah Bienaime,cma  ?

## 2021-12-19 ENCOUNTER — Other Ambulatory Visit: Payer: Self-pay

## 2021-12-19 MED ORDER — SERTRALINE HCL 100 MG PO TABS: 100.0000 mg | ORAL_TABLET | Freq: Every day | ORAL | 0 refills | Status: DC

## 2021-12-19 NOTE — Telephone Encounter (Signed)
I called and verified that the new pharmacy was [facility] on [address]. Osric Klopf,cma  ?

## 2021-12-19 NOTE — Telephone Encounter (Signed)
Pt called in stating no one sent in her refills for medication... advised pt of below notes... Pt stated her number has change. Pt number is [phone]... Will update in system... Pt requesting refill on all medications... Pt requesting callback...  ?

## 2021-12-22 NOTE — Telephone Encounter (Signed)
Pt called in stating that her medication was not called in to pharmacy... Pt is requesting script for medication (Oxcarbazepine (TRILEPTAL) 300 MG tablet)... Pt requesting callback  ?

## 2021-12-22 NOTE — Telephone Encounter (Signed)
I called the pharmacy and the medication has been available for 8 days the patient just needs to pick it up, I called and LVM informing the patient of this.  Tammy Acosta,cma ?

## 2021-12-23 ENCOUNTER — Other Ambulatory Visit: Payer: Self-pay

## 2021-12-23 DIAGNOSIS — G500 Trigeminal neuralgia: Secondary | ICD-10-CM

## 2021-12-23 MED ORDER — OXCARBAZEPINE 300 MG PO TABS: ORAL_TABLET | ORAL | 0 refills | Status: DC

## 2021-12-23 NOTE — Telephone Encounter (Signed)
Medication sent to [facility].  Meko Masterson,cma  ?

## 2021-12-23 NOTE — Telephone Encounter (Signed)
Patient called and said that she changed pharmacy's a month ago. She no longer uses [place], She uses [facility], [facility] on [address] , beside [address]. ?

## 2021-12-26 ENCOUNTER — Encounter: Payer: Self-pay | Admitting: Family Medicine

## 2021-12-26 ENCOUNTER — Telehealth: Payer: Self-pay | Admitting: Family Medicine

## 2021-12-26 ENCOUNTER — Ambulatory Visit (INDEPENDENT_AMBULATORY_CARE_PROVIDER_SITE_OTHER): Payer: Medicare Other | Admitting: Family Medicine

## 2021-12-26 DIAGNOSIS — H209 Unspecified iridocyclitis: Secondary | ICD-10-CM

## 2021-12-26 DIAGNOSIS — E1142 Type 2 diabetes mellitus with diabetic polyneuropathy: Secondary | ICD-10-CM | POA: Diagnosis not present

## 2021-12-26 DIAGNOSIS — I10 Essential (primary) hypertension: Secondary | ICD-10-CM

## 2021-12-26 DIAGNOSIS — R2689 Other abnormalities of gait and mobility: Secondary | ICD-10-CM | POA: Diagnosis not present

## 2021-12-26 DIAGNOSIS — H16232 Neurotrophic keratoconjunctivitis, left eye: Secondary | ICD-10-CM | POA: Diagnosis not present

## 2021-12-26 MED ORDER — INSULIN DEGLUDEC 100 UNIT/ML ~~LOC~~ SOPN: 26.0000 [IU] | PEN_INJECTOR | Freq: Two times a day (BID) | SUBCUTANEOUS | 0 refills | Status: DC

## 2021-12-26 NOTE — Assessment & Plan Note (Signed)
The patient recently ran out of her Tammy Acosta and has not completed the paperwork for patient assistance for this.  We will provide her with a sample of Tammy Acosta to use 26 units twice daily.  She will continue metformin 1000 mg twice daily.  We will have her return for lab work. ?

## 2021-12-26 NOTE — Assessment & Plan Note (Signed)
Possibly related to her neuropathy though I did advise we would have her return to fully evaluate this issue as her eye issue took precedence at this time. ?

## 2021-12-26 NOTE — Patient Instructions (Signed)
Go to [facility] now. Please let them know Dr Druscilla Brownie said to come right over to be seen. ?

## 2021-12-26 NOTE — Progress Notes (Signed)
?Tommi Rumps, MD ?Phone: [phone] ? ?Tammy Acosta is a 66 y.o. female who presents today for follow-up. ? ?Hypertension: Patient reports her blood pressures have not been bad.  She is on amlodipine, HCTZ, and lisinopril.  No chest pain, shortness of breath, or edema. ? ?Diabetes: Patient notes her sugars have been up into the 300s.  She ran out of Basaglar this morning.  She has been taking 26 units twice daily.  She also takes metformin.  She notes no polyuria or polydipsia.  No hypoglycemia.  She has not seen ophthalmology in the past year. ? ?Eye redness: Patient notes her left eye has been red for 1.5 weeks.  There is pain in her face and eye with this.  She notes it fills up with gunk.  She has no injury to the eye.  She notes some blurry vision.  She does have photophobia.  She has been using refresh eyedrops though no other medications in her eye.  No history of glaucoma. ? ?Balance difficulty: Patient notes some occasional lightheadedness and balance issues.  She does have neuropathy and notes her feet are numb and tingly at times. ? ?Social History  ? ?Tobacco Use  ?Smoking Status Some Days  ? Packs/day: 0.00  ? Years: 40.00  ? Pack years: 0.00  ? Types: Cigarettes  ?Smokeless Tobacco Never  ? ? ?Current Outpatient Medications on File Prior to Visit  ?Medication Sig Dispense Refill  ? amLODipine (NORVASC) 5 MG tablet Take 1 tablet (5 mg total) by mouth daily. 90 tablet 0  ? blood glucose meter kit and supplies Dispense One touch meter.  E11.9 1 each 0  ? Blood Glucose Monitoring Suppl (ONE TOUCH ULTRA 2) w/Device KIT Dispense 1 meter to use to test blood glucose once daily. Dx code: E11.42. 1 kit 0  ? ezetimibe (ZETIA) 10 MG tablet Take 1 tablet by mouth once daily 90 tablet 0  ? gabapentin (NEURONTIN) 800 MG tablet Take 1 tablet by mouth 4 times daily 360 tablet 0  ? glucose blood (ONETOUCH ULTRA) test strip USE 1 STRIP TO CHECK GLUCOSE UP TO THREE TIMES DAILY AS DIRECTED. 100 each 1  ?  hydrochlorothiazide (HYDRODIURIL) 25 MG tablet Take 1 tablet by mouth once daily 90 tablet 0  ? ibuprofen (ADVIL,MOTRIN) 800 MG tablet Take 800 mg by mouth every 8 (eight) hours as needed. for pain    ? Icosapent Ethyl (VASCEPA) 1 g CAPS Take 2 capsules (2 g total) by mouth 2 (two) times daily. 120 capsule 3  ? Insulin Pen Needle (PEN NEEDLES) 32G X 5 MM MISC 1 pen by Does not apply route daily. Use to inject insulin daily 100 each 4  ? Lancets (ONETOUCH DELICA PLUS [id number]) MISC USE   TO CHECK GLUCOSE UP TO THREE TIMES DAILY AS DIRECTED 100 each 1  ? lisinopril (ZESTRIL) 40 MG tablet Take 1 tablet (40 mg total) by mouth daily. 90 tablet 0  ? loratadine (CLARITIN) 10 MG tablet Take 1 tablet (10 mg total) by mouth daily. 30 tablet 11  ? metFORMIN (GLUCOPHAGE) 1000 MG tablet Take 1 tablet (1,000 mg total) by mouth 2 (two) times daily with a meal. 180 tablet 0  ? naproxen sodium (ALEVE) 220 MG tablet Take 220 mg by mouth 2 (two) times daily.    ? nitroGLYCERIN (NITROSTAT) 0.4 MG SL tablet Place 1 tablet (0.4 mg total) under the tongue every 5 (five) minutes as needed for chest pain. 25 tablet 6  ? Oxcarbazepine (  TRILEPTAL) 300 MG tablet TAKE 1 TABLET BY MOUTH TWICE DAILY . APPOINTMENT REQUIRED FOR FUTURE REFILLS 180 tablet 0  ? rosuvastatin (CRESTOR) 40 MG tablet Take 1 tablet (40 mg total) by mouth daily. 90 tablet 3  ? sertraline (ZOLOFT) 100 MG tablet Take 1 tablet (100 mg total) by mouth daily. 90 tablet 0  ? tiZANidine (ZANAFLEX) 4 MG tablet TAKE 1 TABLET BY MOUTH EVERY 8 HOURS AS NEEDED FOR MUSCLE SPASM 30 tablet 0  ? Vitamin D, Ergocalciferol, (DRISDOL) 1.25 MG (50000 UNIT) CAPS capsule Take 1 capsule (50,000 Units total) by mouth every 7 (seven) days. 8 capsule 0  ? ?No current facility-administered medications on file prior to visit.  ? ? ? ?ROS see history of present illness ? ?Objective ? ?Physical Exam ?Vitals:  ? 12/26/21 1536  ?BP: 130/70  ?Pulse: 86  ?Temp: 98.5 ?F (36.9 ?C)  ?SpO2: 95%  ? ? ?BP  Readings from Last 3 Encounters:  ?12/26/21 130/70  ?11/13/20 (!) 145/80  ?12/10/19 (!) 171/80  ? ?Wt Readings from Last 3 Encounters:  ?12/26/21 163 lb 9.6 oz (74.2 kg)  ?11/13/20 155 lb 12.8 oz (70.7 kg)  ?11/05/20 172 lb (78 kg)  ? ? ?Physical Exam ?Constitutional:   ?   General: She is not in acute distress. ?   Appearance: She is not diaphoretic.  ?Eyes:  ?   Comments: Left eye with erythematous sclera, the cornea appears somewhat cloudy, the left pupil is smaller than the right pupil, they are both reactive to light, there is photophobia on exam, right sclera appears normal  ?Cardiovascular:  ?   Rate and Rhythm: Normal rate and regular rhythm.  ?   Heart sounds: Normal heart sounds.  ?Pulmonary:  ?   Effort: Pulmonary effort is normal.  ?   Breath sounds: Normal breath sounds.  ?Skin: ?   General: Skin is warm and dry.  ?Neurological:  ?   Mental Status: She is alert.  ? ?Vision screening both eyes 20/50, right eye 20/40, unable to see anything on the eye chart with the left eye ? ?Assessment/Plan: Please see individual problem list. ? ?Problem List Items Addressed This Visit   ? ? Balance problem  ?  Possibly related to her neuropathy though I did advise we would have her return to fully evaluate this issue as her eye issue took precedence at this time. ? ?  ?  ? Essential (primary) hypertension  ?  Adequately controlled.  She will continue amlodipine 5 mg once daily, HCTZ 25 mg daily, and lisinopril 40 mg daily.  She will return for lab work. ? ?  ?  ? Iritis  ?  Patient with significant erythema to her eye and obvious changes to her cornea and pupil.  I discussed this with Dr. George Ina as my initial concern was glaucoma though he noted iritis was more likely given that the affected pupil is not dilated compared to the nonaffected pupil.  He noted that she needed to be seen right away by an ophthalmologist.  The patient had expressed concern about not having a co-pay and Dr. George Ina noted that they would  sort that out after she was seen as she did need to be seen today.  The patient was advised to go to [facility] for further treatment.  I discussed the risk of loss of vision if not appropriately treated. ? ?  ?  ? Type 2 diabetes mellitus ([place])  ?  The patient recently ran out  of her Basaglar and has not completed the paperwork for patient assistance for this.  We will provide her with a sample of Tresiba to use 26 units twice daily.  She will continue metformin 1000 mg twice daily.  We will have her return for lab work. ? ?  ?  ? Relevant Medications  ? insulin degludec (TRESIBA) 100 UNIT/ML FlexTouch Pen  ? ? ? ?Return in about 1 week (around 01/02/2022). CMA will contact the patient next week to get her scheduled for follow-up. ? ?I have spent 51 minutes in the care of this patient regarding History taking, documentation, completion of exam, coordination of care with the ophthalmologist, discussion of her sample of Tresiba.. ? ? ?Tommi Rumps, MD ?[place] ? ?

## 2021-12-26 NOTE — Assessment & Plan Note (Signed)
Patient with significant erythema to her eye and obvious changes to her cornea and pupil.  I discussed this with Dr. Druscilla Brownie as my initial concern was glaucoma though he noted iritis was more likely given that the affected pupil is not dilated compared to the nonaffected pupil.  He noted that she needed to be seen right away by an ophthalmologist.  The patient had expressed concern about not having a co-pay and Dr. Druscilla Brownie noted that they would sort that out after she was seen as she did need to be seen today.  The patient was advised to go to [facility] for further treatment.  I discussed the risk of loss of vision if not appropriately treated. ?

## 2021-12-26 NOTE — Telephone Encounter (Signed)
This patient needs follow-up with me this week.  We can do lab work at this follow-up.  We need to discuss some of the medical issues that we were not able to fully discussed during her visit on Friday.  Please call her and get her scheduled. ?

## 2021-12-26 NOTE — Assessment & Plan Note (Signed)
Adequately controlled.  She will continue amlodipine 5 mg once daily, HCTZ 25 mg daily, and lisinopril 40 mg daily.  She will return for lab work. ?

## 2021-12-29 NOTE — Telephone Encounter (Signed)
I called and spoke  with the patient and she is scheduled this week for a follow up per the provider.. Maitland Muhlbauer,cma  ?

## 2021-12-31 ENCOUNTER — Ambulatory Visit: Payer: Medicare Other | Admitting: Family Medicine

## 2022-01-09 ENCOUNTER — Ambulatory Visit: Payer: Medicare Other | Admitting: Family Medicine

## 2022-01-16 ENCOUNTER — Telehealth: Payer: Self-pay | Admitting: Family Medicine

## 2022-01-16 MED ORDER — EZETIMIBE 10 MG PO TABS: 10.0000 mg | ORAL_TABLET | Freq: Every day | ORAL | 3 refills | Status: DC

## 2022-01-16 NOTE — Telephone Encounter (Signed)
Refill ezetimibe (ZETIA) 10 MG tablet Send to [facility]

## 2022-01-16 NOTE — Telephone Encounter (Signed)
Medication has been refilled.

## 2022-01-28 ENCOUNTER — Ambulatory Visit (INDEPENDENT_AMBULATORY_CARE_PROVIDER_SITE_OTHER): Payer: Medicare Other

## 2022-01-28 VITALS — Ht 62.0 in | Wt 163.0 lb

## 2022-01-28 DIAGNOSIS — Z0000 Encounter for general adult medical examination without abnormal findings: Secondary | ICD-10-CM

## 2022-01-28 NOTE — Patient Instructions (Addendum)
  Tammy Acosta , Thank you for taking time to come for your Medicare Wellness Visit. I appreciate your ongoing commitment to your health goals. Please review the following plan we discussed and let me know if I can assist you in the future.   These are the goals we discussed:  Goals       Patient Stated     Weight (lb) < 150 lb (68 kg) (pt-stated)      Goal 145lb Stay as active as possible.        This is a list of the screening recommended for you and due dates:  Health Maintenance  Topic Date Due   Complete foot exam   02/23/2022*   Hemoglobin A1C  02/23/2022*   Urine Protein Check  02/23/2022*   Pneumonia Vaccine (2 - PCV) 04/24/2022*   Mammogram  04/24/2022*   Colon Cancer Screening  04/24/2022*   Zoster (Shingles) Vaccine (1 of 2) 04/30/2022*   Flu Shot  03/24/2022   Eye exam for diabetics  12/30/2022   Tetanus Vaccine  02/04/2024   DEXA scan (bone density measurement)  Completed   Hepatitis C Screening: USPSTF Recommendation to screen - Ages 69-79 yo.  Completed   HPV Vaccine  Aged Out   COVID-19 Vaccine  Discontinued  *Topic was postponed. The date shown is not the original due date.

## 2022-01-28 NOTE — Progress Notes (Signed)
Subjective:   CHLORA MCBAIN is a 66 y.o. female who presents for Medicare Annual (Subsequent) preventive examination.  Review of Systems    No ROS.  Medicare Wellness Virtual Visit.  Visual/audio telehealth visit, UTA vital signs.   See social history for additional risk factors.   Cardiac Risk Factors include: advanced age (>21mn, >>58women);diabetes mellitus     Objective:    Today's Vitals   01/28/22 1033  Weight: 163 lb (73.9 kg)  Height: _0  (1.575 m)   Body mass index is 29.81 kg/m.     01/28/2022   10:40 AM 11/05/2020    1:12 PM 11/03/2019    1:15 PM 11/02/2018   10:04 AM 11/06/2016    4:12 PM 09/24/2016    1:28 PM 06/24/2016   12:50 PM  Advanced Directives  Does Patient Have a Medical Advance Directive? _1  No No  Does patient want to make changes to medical advance directive?    No - Patient declined     Would patient like information on creating a medical advance directive? No - Patient declined No - Patient declined No - Patient declined  Yes (MAU/Ambulatory/Procedural Areas - Information given) No - Patient declined     Current Medications (verified) Outpatient Encounter Medications as of 01/28/2022  Medication Sig   amLODipine (NORVASC) 5 MG tablet Take 1 tablet (5 mg total) by mouth daily.   blood glucose meter kit and supplies Dispense One touch meter.  E11.9   Blood Glucose Monitoring Suppl (ONE TOUCH ULTRA 2) w/Device KIT Dispense 1 meter to use to test blood glucose once daily. Dx code: E11.42.   ezetimibe (ZETIA) 10 MG tablet Take 1 tablet (10 mg total) by mouth daily.   gabapentin (NEURONTIN) 800 MG tablet Take 1 tablet by mouth 4 times daily   glucose blood (ONETOUCH ULTRA) test strip USE 1 STRIP TO CHECK GLUCOSE UP TO THREE TIMES DAILY AS DIRECTED.   hydrochlorothiazide (HYDRODIURIL) 25 MG tablet Take 1 tablet by mouth once daily   ibuprofen (ADVIL,MOTRIN) 800 MG tablet Take 800 mg by mouth every 8 (eight) hours as needed. for pain    Icosapent Ethyl (VASCEPA) 1 g CAPS Take 2 capsules (2 g total) by mouth 2 (two) times daily.   insulin degludec (TRESIBA) 100 UNIT/ML FlexTouch Pen Inject 26 Units into the skin 2 (two) times daily.   Insulin Pen Needle (PEN NEEDLES) 32G X 5 MM MISC 1 pen by Does not apply route daily. Use to inject insulin daily   Lancets (ONETOUCH DELICA PLUS [id number] MISC USE   TO CHECK GLUCOSE UP TO THREE TIMES DAILY AS DIRECTED   lisinopril (ZESTRIL) 40 MG tablet Take 1 tablet (40 mg total) by mouth daily.   loratadine (CLARITIN) 10 MG tablet Take 1 tablet (10 mg total) by mouth daily.   metFORMIN (GLUCOPHAGE) 1000 MG tablet Take 1 tablet (1,000 mg total) by mouth 2 (two) times daily with a meal.   naproxen sodium (ALEVE) 220 MG tablet Take 220 mg by mouth 2 (two) times daily.   nitroGLYCERIN (NITROSTAT) 0.4 MG SL tablet Place 1 tablet (0.4 mg total) under the tongue every 5 (five) minutes as needed for chest pain.   Oxcarbazepine (TRILEPTAL) 300 MG tablet TAKE 1 TABLET BY MOUTH TWICE DAILY . APPOINTMENT REQUIRED FOR FUTURE REFILLS   rosuvastatin (CRESTOR) 40 MG tablet Take 1 tablet (40 mg total) by mouth daily.   sertraline (ZOLOFT) 100 MG tablet Take 1 tablet (100  mg total) by mouth daily.   tiZANidine (ZANAFLEX) 4 MG tablet TAKE 1 TABLET BY MOUTH EVERY 8 HOURS AS NEEDED FOR MUSCLE SPASM   Vitamin D, Ergocalciferol, (DRISDOL) 1.25 MG (50000 UNIT) CAPS capsule Take 1 capsule (50,000 Units total) by mouth every 7 (seven) days.   No facility-administered encounter medications on file as of 01/28/2022.    Allergies (verified) Penicillins, Meloxicam, and Tetracyclines & related   History: Past Medical History:  Diagnosis Date   Altered mental status 02/04/2014   Arthritis    Colon polyps    Depression    Diabetes mellitus without complication ([place])    Pt states she takes Insulin and metformin.   Hypercholesteremia    Hyperlipidemia    Hypertension    Shingles    10/17   Spine disorder     Trigeminal herpes zoster (left V1 distribution) 06/24/2016   Viral upper respiratory illness 12/03/2015   Past Surgical History:  Procedure Laterality Date   ABDOMINAL HYSTERECTOMY     BACK SURGERY     CHOLECYSTECTOMY     KYPHOPLASTY     Family History  Problem Relation Age of Onset   Heart disease Mother    Hypertension Mother    Cancer Mother        breast   Breast cancer Mother        early 61's   Heart disease Father    Arthritis Other        Parent, other relative   Colon cancer Other        Parent   Lung cancer Other        Other relative   Diabetes Other        Other relative   Uterine cancer Other        Other relative   Cancer Maternal Aunt        breast   Breast cancer Maternal Aunt    Cancer Maternal Aunt        ovarian   Breast cancer Cousin    Breast cancer Cousin    Breast cancer Cousin    Social History   Socioeconomic History   Marital status: Divorced    Spouse name: Not on file   Number of children: Not on file   Years of education: Not on file   Highest education level: Not on file  Occupational History   Not on file  Tobacco Use   Smoking status: Every Day    Packs/day: 0.00    Years: 40.00    Pack years: 0.00    Types: Cigarettes   Smokeless tobacco: Never  Substance and Sexual Activity   Alcohol use: No   Drug use: No   Sexual activity: Never  Other Topics Concern   Not on file  Social History Narrative   Not on file   Social Determinants of Health   Financial Resource Strain: Low Risk    Difficulty of Paying Living Expenses: Not hard at all  Food Insecurity: No Food Insecurity   Worried About Charity fundraiser in the Last Year: Never true   Ran Out of Food in the Last Year: Never true  Transportation Needs: No Transportation Needs   Lack of Transportation (Medical): No   Lack of Transportation (Non-Medical): No  Physical Activity: Not on file  Stress: No Stress Concern Present   Feeling of Stress : Not at all  Social  Connections: Unknown   Frequency of Communication with Friends and Family: More than  three times a week   Frequency of Social Gatherings with Friends and Family: More than three times a week   Attends Religious Services: Not on file   Active Member of Clubs or Organizations: Not on file   Attends Archivist Meetings: Not on file   Marital Status: Not on file   Tobacco Counseling Ready to quit: Not Answered Counseling given: Not Answered  Clinical Intake: Pre-visit preparation completed: Yes           How often do you need to have someone help you when you read instructions, pamphlets, or other written materials from your doctor or pharmacy?: 1 - Never    Interpreter Needed?: No      Activities of Daily Living    01/28/2022   10:42 AM  In your present state of health, do you have any difficulty performing the following activities:  Hearing? 0  Vision? 0  Difficulty concentrating or making decisions? 0  Walking or climbing stairs? 0  Dressing or bathing? 0  Doing errands, shopping? 0  Preparing Food and eating ? N  Using the Toilet? N  In the past six months, have you accidently leaked urine? N  Do you have problems with loss of bowel control? N  Managing your Medications? N  Managing your Finances? N  Housekeeping or managing your Housekeeping? N    Patient Care Team: Leone Haven, MD as PCP - General (Family Medicine)  Indicate any recent Medical Services you may have received from other than [facility] providers in the past year (date may be approximate).     Assessment:   This is a routine wellness examination for Tailer.  Virtual Visit via Telephone Note  I connected with  Shaivi Rothschild Rahming on 01/28/22 at 10:30 AM EDT by telephone and verified that I am speaking with the correct person using two identifiers.  Persons participating in the virtual visit: [place].   I discussed the limitations of performing an evaluation  and management service by telehealth. We continued and completed visit with audio only. Some vital signs may be absent or patient reported.   Hearing/Vision screen Vision Screening - Comments:: Followed by [facility]  Wears corrective lenses They have regular follow up with the ophthalmologist  Dietary issues and exercise activities discussed: Current Exercise Habits: The patient does not participate in regular exercise at present Monitors carb intake Fair water intake   Goals Addressed               This Visit's Progress     Patient Stated     Weight (lb) < 150 lb (68 kg) (pt-stated)   163 lb (73.9 kg)     Goal 145lb Stay as active as possible.       Depression Screen    01/28/2022   10:38 AM 12/26/2021    3:37 PM 11/05/2020    1:06 PM 11/03/2019    1:15 PM 06/21/2019    2:48 PM 11/02/2018   10:05 AM 07/15/2018    9:02 AM  PHQ 2/9 Scores  PHQ - 2 Score 1 0 1 1 0 0 0    Fall Risk    01/28/2022   10:41 AM 12/26/2021    3:37 PM 11/05/2020    1:12 PM 11/03/2019    1:07 PM 06/21/2019    2:48 PM  [place] in the past year? 0 0 0 1 0  Number falls in past yr:  0 0  0 0  Injury with Fall?  0 0 0   Comment    Missed a step, lost her balance and fell down the stairs. She did not seek medical care.   Risk for fall due to :  No Fall Risks     Follow up Falls evaluation completed Falls evaluation completed Falls evaluation completed Falls evaluation completed;Falls prevention discussed;Education provided Falls evaluation completed    FALL RISK PREVENTION PERTAINING TO THE HOME: Home free of loose throw rugs in walkways, pet beds, electrical cords, etc? Yes  Adequate lighting in your home to reduce risk of falls? Yes   ASSISTIVE DEVICES UTILIZED TO PREVENT FALLS: Life alert? No  Use of a cane, walker or w/c? No   TIMED UP AND GO: Was the test performed? No .   Cognitive Function: Patient is alert and oriented x3.     11/06/2016    4:47 PM  MMSE -  Mini Mental State Exam  Orientation to time 5  Orientation to Place 5  Registration 3  Attention/ Calculation 5  Recall 3  Language- name 2 objects 2  Language- repeat 1  Language- follow 3 step command 3  Language- read & follow direction 1  Write a sentence 1  Copy design 1  Total score 30        01/28/2022   10:48 AM 11/03/2019    1:32 PM 11/02/2018   10:05 AM  6CIT Screen  What Year? 0 points 0 points 0 points  What month? 0 points 0 points 0 points  What time? 0 points 0 points 0 points  Count back from 20 0 points 0 points 0 points  Months in reverse 2 points 0 points 0 points  Repeat phrase 0 points 0 points 0 points  Total Score 2 points 0 points 0 points    Immunizations Immunization History  Administered Date(s) Administered   Influenza,inj,Quad PF,6+ Mos 10/30/2015, 07/01/2017, 07/15/2018   Pneumococcal Polysaccharide-23 10/30/2015   Tdap 02/03/2014   PNA vaccine- declined per patient.  Shingrix Completed?: No.    Education has been provided regarding the importance of this vaccine. Patient has been advised to call insurance company to determine out of pocket expense if they have not yet received this vaccine. Advised may also receive vaccine at local pharmacy or Health Dept. Verbalized acceptance and understanding.  Screening Tests Health Maintenance  Topic Date Due   FOOT EXAM  02/23/2022 (Originally 10/15/2018)   HEMOGLOBIN A1C  02/23/2022 (Originally 05/16/2021)   URINE MICROALBUMIN  02/23/2022 (Originally 03/03/1966)   Pneumonia Vaccine 73+ Years old (2 - PCV) 04/24/2022 (Originally 10/29/2016)   MAMMOGRAM  04/24/2022 (Originally 01/26/2020)   COLONOSCOPY (Pts 45-69yr Insurance coverage will need to be confirmed)  04/24/2022 (Originally 12/12/2012)   Zoster Vaccines- Shingrix (1 of 2) 04/30/2022 (Originally 03/03/2005)   INFLUENZA VACCINE  03/24/2022   OPHTHALMOLOGY EXAM  12/30/2022   TETANUS/TDAP  02/04/2024   DEXA SCAN  Completed   Hepatitis C Screening   Completed   HPV VACCINES  Aged Out   COVID-19 Vaccine  Discontinued   Health Maintenance There are no preventive care reminders to display for this patient.  Colonoscopy- deferred per patient.  Mammogram- deferred per patient.  Lung Cancer Screening: deferred per patient.   Vision Screening: Recommended annual ophthalmology exams for early detection of glaucoma and other disorders of the eye.  Dental Screening: Recommended annual dental exams for proper oral hygiene  Community Resource Referral / Chronic Care Management: CRR required this visit?  No   CCM required this visit?  No      Plan:   Keep all routine maintenance appointments.   I have personally reviewed and noted the following in the patient's chart:   Medical and social history Use of alcohol, tobacco or illicit drugs  Current medications and supplements including opioid prescriptions.  Functional ability and status Nutritional status Physical activity Advanced directives List of other physicians Hospitalizations, surgeries, and ER visits in previous 12 months Vitals Screenings to include cognitive, depression, and falls Referrals and appointments  In addition, I have reviewed and discussed with patient certain preventive protocols, quality metrics, and best practice recommendations. A written personalized care plan for preventive services as well as general preventive health recommendations were provided to patient.     Varney Biles, LPN   11/27/4312

## 2022-02-10 ENCOUNTER — Telehealth: Payer: Self-pay | Admitting: Family Medicine

## 2022-02-10 DIAGNOSIS — B0222 Postherpetic trigeminal neuralgia: Secondary | ICD-10-CM

## 2022-02-10 DIAGNOSIS — M792 Neuralgia and neuritis, unspecified: Secondary | ICD-10-CM

## 2022-02-10 MED ORDER — GABAPENTIN 800 MG PO TABS: 800.0000 mg | ORAL_TABLET | Freq: Four times a day (QID) | ORAL | 3 refills | Status: DC

## 2022-02-10 NOTE — Telephone Encounter (Signed)
Patient called requesting a refill on her gabapentin (NEURONTIN) 800 MG tablet. Pharmacy is still [facility].

## 2022-02-19 ENCOUNTER — Telehealth: Payer: Self-pay

## 2022-02-19 NOTE — Telephone Encounter (Signed)
Patient came in with a form from [place] [employer] patient assistance and her handicap form I placed these in the sign basket, I also gave the patient 1 box of Tresiba 100 units until her medication application  is approved.  Haider Hornaday,cma

## 2022-02-19 NOTE — Telephone Encounter (Signed)
Patient dropped off [name] application and a handicapp renewal form for Dr Birdie Sons to complete. These forms will handed to [name] because she need them since patient was requesting medication samples.

## 2022-02-19 NOTE — Telephone Encounter (Signed)
Patient states she will be dropping off form for Dr. [name] to fill out regarding her insulin.  Patient states she going to need to pick up some more insulin from [place] too and would like to know if she can get it when she drops her paperwork off.

## 2022-02-23 ENCOUNTER — Telehealth (INDEPENDENT_AMBULATORY_CARE_PROVIDER_SITE_OTHER): Payer: Medicare Other | Admitting: Family Medicine

## 2022-02-23 ENCOUNTER — Encounter: Payer: Self-pay | Admitting: Family Medicine

## 2022-02-23 VITALS — Ht 62.0 in | Wt 163.0 lb

## 2022-02-23 DIAGNOSIS — E559 Vitamin D deficiency, unspecified: Secondary | ICD-10-CM | POA: Diagnosis not present

## 2022-02-23 DIAGNOSIS — F419 Anxiety disorder, unspecified: Secondary | ICD-10-CM

## 2022-02-23 DIAGNOSIS — E785 Hyperlipidemia, unspecified: Secondary | ICD-10-CM | POA: Diagnosis not present

## 2022-02-23 DIAGNOSIS — E1142 Type 2 diabetes mellitus with diabetic polyneuropathy: Secondary | ICD-10-CM

## 2022-02-23 DIAGNOSIS — H209 Unspecified iridocyclitis: Secondary | ICD-10-CM

## 2022-02-23 DIAGNOSIS — F32A Depression, unspecified: Secondary | ICD-10-CM | POA: Diagnosis not present

## 2022-02-23 MED ORDER — SERTRALINE HCL 100 MG PO TABS: 150.0000 mg | ORAL_TABLET | Freq: Every day | ORAL | 1 refills | Status: DC

## 2022-02-23 MED ORDER — TRAZODONE HCL 50 MG PO TABS: 25.0000 mg | ORAL_TABLET | Freq: Every evening | ORAL | 3 refills | Status: DC | PRN

## 2022-02-23 NOTE — Progress Notes (Signed)
Virtual Visit via video Note  This visit type was conducted due to national recommendations for restrictions regarding the COVID-19 pandemic (e.g. social distancing).  This format is felt to be most appropriate for this patient at this time.  All issues noted in this document were discussed and addressed.  No physical exam was performed (except for noted visual exam findings with Video Visits).   I connected with Tammy Acosta today at  4:00 PM EDT by a video enabled telemedicine application and verified that I am speaking with the correct person using two identifiers. Location patient: home Location provider: work Persons participating in the virtual visit: patient, provider  I discussed the limitations, risks, security and privacy concerns of performing an evaluation and management service by telephone and the availability of in person appointments. I also discussed with the patient that there may be a patient responsible charge related to this service. The patient expressed understanding and agreed to proceed.  Reason for visit: Same-day visit.  HPI: Sleeping difficulty: Patient reports this has been going on a couple months.  She has felt a little depressed and anxious for the last couple months as well.  No SI.  She has 1 caffeinated beverage in the morning.  She does not drink alcohol.  She does watch TV the hour before bed.  She tries to go to bed around 9:30 PM and sleeps for 30 minutes to 60 minutes and then wakes up and is just up at that point.  She has been taking Zoloft 100 mg daily.  Iritis: Patient notes she saw ophthalmology and this cleared up with eyedrops.  She notes they released her.   ROS: See pertinent positives and negatives per HPI.  Past Medical History:  Diagnosis Date   Altered mental status 02/04/2014   Arthritis    Colon polyps    Depression    Diabetes mellitus without complication ([place])    Pt states she takes Insulin and metformin.    Hypercholesteremia    Hyperlipidemia    Hypertension    Shingles    10/17   Spine disorder    Trigeminal herpes zoster (left V1 distribution) 06/24/2016   Viral upper respiratory illness 12/03/2015    Past Surgical History:  Procedure Laterality Date   ABDOMINAL HYSTERECTOMY     BACK SURGERY     CHOLECYSTECTOMY     KYPHOPLASTY      Family History  Problem Relation Age of Onset   Heart disease Mother    Hypertension Mother    Cancer Mother        breast   Breast cancer Mother        early 96's   Heart disease Father    Arthritis Other        Parent, other relative   Colon cancer Other        Parent   Lung cancer Other        Other relative   Diabetes Other        Other relative   Uterine cancer Other        Other relative   Cancer Maternal Aunt        breast   Breast cancer Maternal Aunt    Cancer Maternal Aunt        ovarian   Breast cancer Cousin    Breast cancer Cousin    Breast cancer Cousin     SOCIAL HX: Smoker   Current Outpatient Medications:    blood glucose meter  kit and supplies, Dispense One touch meter.  E11.9, Disp: 1 each, Rfl: 0   Blood Glucose Monitoring Suppl (ONE TOUCH ULTRA 2) w/Device KIT, Dispense 1 meter to use to test blood glucose once daily. Dx code: E11.42., Disp: 1 kit, Rfl: 0   ezetimibe (ZETIA) 10 MG tablet, Take 1 tablet (10 mg total) by mouth daily., Disp: 90 tablet, Rfl: 3   gabapentin (NEURONTIN) 800 MG tablet, Take 1 tablet (800 mg total) by mouth 4 (four) times daily., Disp: 360 tablet, Rfl: 3   glucose blood (ONETOUCH ULTRA) test strip, USE 1 STRIP TO CHECK GLUCOSE UP TO THREE TIMES DAILY AS DIRECTED., Disp: 100 each, Rfl: 1   hydrochlorothiazide (HYDRODIURIL) 25 MG tablet, Take 1 tablet by mouth once daily, Disp: 90 tablet, Rfl: 0   insulin degludec (TRESIBA) 100 UNIT/ML FlexTouch Pen, Inject 26 Units into the skin 2 (two) times daily., Disp: 6 mL, Rfl: 0   Insulin Pen Needle (PEN NEEDLES) 32G X 5 MM MISC, 1 pen by Does not  apply route daily. Use to inject insulin daily, Disp: 100 each, Rfl: 4   Lancets (ONETOUCH DELICA PLUS [id number]) MISC, USE   TO CHECK GLUCOSE UP TO THREE TIMES DAILY AS DIRECTED, Disp: 100 each, Rfl: 1   metFORMIN (GLUCOPHAGE) 1000 MG tablet, Take 1 tablet (1,000 mg total) by mouth 2 (two) times daily with a meal., Disp: 180 tablet, Rfl: 0   Oxcarbazepine (TRILEPTAL) 300 MG tablet, TAKE 1 TABLET BY MOUTH TWICE DAILY . APPOINTMENT REQUIRED FOR FUTURE REFILLS, Disp: 180 tablet, Rfl: 0   rosuvastatin (CRESTOR) 40 MG tablet, Take 1 tablet (40 mg total) by mouth daily., Disp: 90 tablet, Rfl: 3   tiZANidine (ZANAFLEX) 4 MG tablet, TAKE 1 TABLET BY MOUTH EVERY 8 HOURS AS NEEDED FOR MUSCLE SPASM, Disp: 30 tablet, Rfl: 0   traZODone (DESYREL) 50 MG tablet, Take 0.5-1 tablets (25-50 mg total) by mouth at bedtime as needed for sleep., Disp: 30 tablet, Rfl: 3   amLODipine (NORVASC) 5 MG tablet, Take 1 tablet (5 mg total) by mouth daily., Disp: 90 tablet, Rfl: 0   lisinopril (ZESTRIL) 40 MG tablet, Take 1 tablet (40 mg total) by mouth daily., Disp: 90 tablet, Rfl: 0   nitroGLYCERIN (NITROSTAT) 0.4 MG SL tablet, Place 1 tablet (0.4 mg total) under the tongue every 5 (five) minutes as needed for chest pain., Disp: 25 tablet, Rfl: 6   sertraline (ZOLOFT) 100 MG tablet, Take 1.5 tablets (150 mg total) by mouth daily., Disp: 135 tablet, Rfl: 1  EXAM:  VITALS per patient if applicable:  GENERAL: alert, oriented, appears well and in no acute distress  HEENT: atraumatic, conjunttiva clear, no obvious abnormalities on inspection of external nose and ears  NECK: normal movements of the head and neck  LUNGS: on inspection no signs of respiratory distress, breathing rate appears normal, no obvious gross SOB, gasping or wheezing  CV: no obvious cyanosis  MS: moves all visible extremities without noticeable abnormality  PSYCH/NEURO: pleasant and cooperative, no obvious depression or anxiety, speech and thought  processing grossly intact  ASSESSMENT AND PLAN:  Discussed the following assessment and plan:  Problem List Items Addressed This Visit     Anxiety and depression - Primary (Chronic)    Chronic ongoing issue.  I do think this is likely contributing to her sleeping issues.  We are going to increase her Zoloft to 150 mg daily.  We will add trazodone 25-50 mg nightly as needed for sleep to see  if that will help in the short-term as it may take some time for the Zoloft to be beneficial.  Advised to monitor for excessive drowsiness with the trazodone and let us know if that occurs.  Discussed eliminating screens the hour before bed.  She will follow-up in 6 weeks.      Relevant Medications   sertraline (ZOLOFT) 100 MG tablet   traZODone (DESYREL) 50 MG tablet   Hyperlipidemia   Relevant Orders   Comp Met (CMET)   Lipid panel   Iritis    Resolved with treatment through ophthalmology.      Type 2 diabetes mellitus (HCC)   Relevant Orders   HgB A1c   Other Visit Diagnoses     Vitamin D deficiency       Relevant Orders   Vitamin D (25 hydroxy)       Return in about 3 weeks (around 03/16/2022) for Labs, 6 weeks PCP for anxiety and depression and sleep.   I discussed the assessment and treatment plan with the patient. The patient was provided an opportunity to ask questions and all were answered. The patient agreed with the plan and demonstrated an understanding of the instructions.   The patient was advised to call back or seek an in-person evaluation if the symptoms worsen or if the condition fails to improve as anticipated.   Tommi Rumps, MD

## 2022-02-23 NOTE — Assessment & Plan Note (Signed)
Chronic ongoing issue.  I do think this is likely contributing to her sleeping issues.  We are going to increase her Zoloft to 150 mg daily.  We will add trazodone 25-50 mg nightly as needed for sleep to see if that will help in the short-term as it may take some time for the Zoloft to be beneficial.  Advised to monitor for excessive drowsiness with the trazodone and let us know if that occurs.  Discussed eliminating screens the hour before bed.  She will follow-up in 6 weeks.

## 2022-02-23 NOTE — Assessment & Plan Note (Signed)
Resolved with treatment through ophthalmology.

## 2022-02-23 NOTE — Progress Notes (Signed)
Trouble sleeping  last couple months. Sleeps about 30 mins and then she is awake

## 2022-02-25 ENCOUNTER — Telehealth: Payer: Self-pay

## 2022-02-25 NOTE — Telephone Encounter (Signed)
Patient returned Charlyne Mom, CMA's call.  Patient's call was transferred to [facility].

## 2022-02-25 NOTE — Telephone Encounter (Signed)
Patient returned my call and I asked her which medication was the [facility] form for and she stated basaglar insulin injection, she stated that the provider gave her a sample of something different in office because she could not afford it but she normally uses Hospital doctor.  Tammy Acosta,cma The form is back in the sign basket for your review and sign.  Tammy Acosta,cma

## 2022-02-25 NOTE — Telephone Encounter (Signed)
I called and LVM for the patient to call me back about her [facility] care application.  Josuha Fontanez,cma

## 2022-02-26 NOTE — Telephone Encounter (Signed)
Completed.

## 2022-02-26 NOTE — Telephone Encounter (Signed)
I completed my portion. Please fill in our information and attach her current medication list.

## 2022-02-27 NOTE — Telephone Encounter (Signed)
Faxed to [employer] today and sent to scan.  Confirmation given.  Evalisse Prajapati,cma

## 2022-03-02 ENCOUNTER — Telehealth: Payer: Self-pay | Admitting: Family Medicine

## 2022-03-02 NOTE — Telephone Encounter (Signed)
I called and LVM for patient to call back.  Hurshell Dino,cma  

## 2022-03-02 NOTE — Telephone Encounter (Signed)
Please find out what she needs the handicap placard for. She dropped of a form for this.

## 2022-03-03 NOTE — Telephone Encounter (Signed)
LVM for patient to call back.   Tammy Acosta,cma  

## 2022-03-03 NOTE — Telephone Encounter (Signed)
I called and LVM informing the patient that her handicap form is ready.  Maleek Craver,cma

## 2022-03-03 NOTE — Telephone Encounter (Signed)
Patient returned Nina Gonzalez, CMA's call.  I transferred call to Nina. 

## 2022-03-03 NOTE — Telephone Encounter (Signed)
Signed.

## 2022-03-03 NOTE — Telephone Encounter (Signed)
Patient called back and I asked her can she walk more than 200 feet without stopping and she stated she cannot because of her back and that's why she has always had the form filled out.  Deiondre Harrower,cma .

## 2022-03-04 ENCOUNTER — Other Ambulatory Visit: Payer: Self-pay | Admitting: Family Medicine

## 2022-03-04 DIAGNOSIS — I10 Essential (primary) hypertension: Secondary | ICD-10-CM

## 2022-03-16 ENCOUNTER — Other Ambulatory Visit: Payer: Medicare Other

## 2022-03-20 ENCOUNTER — Other Ambulatory Visit: Payer: Medicare Other

## 2022-03-23 ENCOUNTER — Other Ambulatory Visit: Payer: Self-pay | Admitting: Family Medicine

## 2022-03-23 DIAGNOSIS — G500 Trigeminal neuralgia: Secondary | ICD-10-CM

## 2022-03-23 DIAGNOSIS — I10 Essential (primary) hypertension: Secondary | ICD-10-CM

## 2022-03-23 NOTE — Telephone Encounter (Signed)
Refilled: 12/23/2021 Last OV: 02/23/2022 Next OV: 04/06/2022

## 2022-03-24 ENCOUNTER — Telehealth: Payer: Self-pay

## 2022-03-24 NOTE — Patient Outreach (Signed)
  Care Management   Outreach Note  03/24/2022 Name: Tammy Acosta MRN: [id number] DOB: 03-Mar-1956  An unsuccessful telephone outreach was attempted today. The patient was referred to the case management team for assistance with care management and care coordination.   Follow Up Plan:  The care management team will reach out to the patient again over the next 14 days.   George Ina RN,BSN,CCM RN Care Manager Coordinator [phone]

## 2022-04-06 ENCOUNTER — Ambulatory Visit: Payer: Medicare Other | Admitting: Family Medicine

## 2022-05-18 ENCOUNTER — Other Ambulatory Visit: Payer: Self-pay | Admitting: Family Medicine

## 2022-05-18 DIAGNOSIS — E1142 Type 2 diabetes mellitus with diabetic polyneuropathy: Secondary | ICD-10-CM

## 2022-05-29 ENCOUNTER — Telehealth (INDEPENDENT_AMBULATORY_CARE_PROVIDER_SITE_OTHER): Payer: Medicare Other | Admitting: Family Medicine

## 2022-05-29 ENCOUNTER — Encounter: Payer: Self-pay | Admitting: Family Medicine

## 2022-05-29 DIAGNOSIS — E1142 Type 2 diabetes mellitus with diabetic polyneuropathy: Secondary | ICD-10-CM | POA: Diagnosis not present

## 2022-05-29 DIAGNOSIS — G500 Trigeminal neuralgia: Secondary | ICD-10-CM | POA: Diagnosis not present

## 2022-05-29 DIAGNOSIS — F419 Anxiety disorder, unspecified: Secondary | ICD-10-CM

## 2022-05-29 DIAGNOSIS — F32A Depression, unspecified: Secondary | ICD-10-CM

## 2022-05-29 DIAGNOSIS — G8929 Other chronic pain: Secondary | ICD-10-CM | POA: Diagnosis not present

## 2022-05-29 DIAGNOSIS — M545 Low back pain, unspecified: Secondary | ICD-10-CM

## 2022-05-29 DIAGNOSIS — I10 Essential (primary) hypertension: Secondary | ICD-10-CM

## 2022-05-29 MED ORDER — TRAZODONE HCL 50 MG PO TABS: 100.0000 mg | ORAL_TABLET | Freq: Every evening | ORAL | 0 refills | Status: DC | PRN

## 2022-05-29 NOTE — Assessment & Plan Note (Signed)
Her single reported blood pressure was well controlled.  We will check this at her next visit.  She will continue amlodipine 5 mg daily, HCTZ 25 mg daily, and lisinopril 40 mg daily.

## 2022-05-29 NOTE — Assessment & Plan Note (Signed)
Reported CBGs seem to be well controlled.  She is due for an A1c.  We will check this at her next office visit in a couple of weeks.  She will continue metformin 1000 mg twice daily and Basaglar 26 units twice daily.

## 2022-05-29 NOTE — Assessment & Plan Note (Signed)
Chronic issue with recent worsening.  Discussed having her into the office to complete a exam and follow-up on this in person.  She will be scheduled for a couple weeks from now.  Discussed if she develops any acute changes in her strength she needs to seek medical attention immediately.  Patient did note she would not undergo another surgery for this.

## 2022-05-29 NOTE — Assessment & Plan Note (Signed)
Asymptomatic.  She will continue Trileptal 300 mg twice daily.

## 2022-05-29 NOTE — Assessment & Plan Note (Signed)
Patient has had some improvement.  We will have her try trazodone 100 mg nightly to see if that helps with her sleep.  Discussed the potential for seeing how she does with the trazodone increase versus also increasing Zoloft and she wanted to defer any further increases in the Zoloft.  She will continue Zoloft 150 mg daily.

## 2022-05-29 NOTE — Progress Notes (Signed)
Virtual Visit via video Note  This visit type was conducted due to national recommendations for restrictions regarding the COVID-19 pandemic (e.g. social distancing).  This format is felt to be most appropriate for this patient at this time.  All issues noted in this document were discussed and addressed.  No physical exam was performed (except for noted visual exam findings with Video Visits).   I connected with Tammy Acosta today at 12:00 PM EDT by a video enabled telemedicine application and verified that I am speaking with the correct person using two identifiers. Location patient: home Location provider: work  Persons participating in the virtual visit: patient, provider  I discussed the limitations, risks, security and privacy concerns of performing an evaluation and management service by telephone and the availability of in person appointments. I also discussed with the patient that there may be a patient responsible charge related to this service. The patient expressed understanding and agreed to proceed.   Reason for visit: f/u.  HPI: HYPERTENSION Disease Monitoring Home BP Monitoring not checking at home though she occasionally checks it at the pharmacy and it was 118/70 the last time she checked it chest pain- no    Dyspnea- no Medications Compliance-  taking amlodipine, HCTZ, lisinopril.   Edema- no BMET    Component Value Date/Time   NA 139 11/13/2020 0935   NA 139 01/09/2013 1641   K 4.3 11/13/2020 0935   K 4.1 01/09/2013 1641   CL 102 11/13/2020 0935   CL 103 01/09/2013 1641   CO2 27 11/13/2020 0935   CO2 31 01/09/2013 1641   GLUCOSE 116 (H) 11/13/2020 0935   GLUCOSE 58 (L) 01/09/2013 1641   BUN 25 (H) 11/13/2020 0935   BUN 14 01/09/2013 1641   CREATININE 0.85 11/13/2020 0935   CREATININE 0.94 01/09/2013 1641   CALCIUM 9.1 11/13/2020 0935   CALCIUM 9.5 01/09/2013 1641   GFRNONAA 36 (L) 01/06/2018 1915   GFRNONAA >60 01/09/2013 1641   GFRAA 42 (L)  01/06/2018 1915   GFRAA >60 01/09/2013 1641   DIABETES Disease Monitoring: Blood Sugar ranges-80s-100s Polyuria/phagia/dipsia-no      Optho- UTD Medications: Compliance- taking basaglar, metformin hypoglycemic symptoms- note rarely, she will drink OJ and feel better.  Anxiety/depression: Patient notes some anxiety.  Depression is improved a fair bit with the increased dose of Zoloft.  She notes the trazodone did not help her sleep.  No SI.  Trigeminal neuralgia: Patient is taking Trileptal.  No headaches.  Chronic back pain: Patient notes this has progressively gotten worse.  She notes her right leg feels weaker than her left.  She takes her time when she is walking around.  She notes that weakness has been going on at least a few months.  She notes no numbness.  No incontinence.  She is status post lumbar fusion in the distant past.    ROS: See pertinent positives and negatives per HPI.  Past Medical History:  Diagnosis Date   Altered mental status 02/04/2014   Arthritis    Colon polyps    Depression    Diabetes mellitus without complication ([place])    Pt states she takes Insulin and metformin.   Hypercholesteremia    Hyperlipidemia    Hypertension    Shingles    10/17   Spine disorder    Trigeminal herpes zoster (left V1 distribution) 06/24/2016   Viral upper respiratory illness 12/03/2015    Past Surgical History:  Procedure Laterality Date   ABDOMINAL HYSTERECTOMY  BACK SURGERY     CHOLECYSTECTOMY     KYPHOPLASTY      Family History  Problem Relation Age of Onset   Heart disease Mother    Hypertension Mother    Cancer Mother        breast   Breast cancer Mother        early 34's   Heart disease Father    Arthritis Other        Parent, other relative   Colon cancer Other        Parent   Lung cancer Other        Other relative   Diabetes Other        Other relative   Uterine cancer Other        Other relative   Cancer Maternal Aunt        breast    Breast cancer Maternal Aunt    Cancer Maternal Aunt        ovarian   Breast cancer Cousin    Breast cancer Cousin    Breast cancer Cousin     SOCIAL HX: Smoker   Current Outpatient Medications:    amLODipine (NORVASC) 5 MG tablet, TAKE 1 TABLET(5 MG) BY MOUTH DAILY, Disp: 90 tablet, Rfl: 0   blood glucose meter kit and supplies, Dispense One touch meter.  E11.9, Disp: 1 each, Rfl: 0   Blood Glucose Monitoring Suppl (ONE TOUCH ULTRA 2) w/Device KIT, Dispense 1 meter to use to test blood glucose once daily. Dx code: E11.42., Disp: 1 kit, Rfl: 0   ezetimibe (ZETIA) 10 MG tablet, Take 1 tablet (10 mg total) by mouth daily., Disp: 90 tablet, Rfl: 3   gabapentin (NEURONTIN) 800 MG tablet, Take 1 tablet (800 mg total) by mouth 4 (four) times daily., Disp: 360 tablet, Rfl: 3   glucose blood (ONETOUCH ULTRA) test strip, USE 1 STRIP TO CHECK GLUCOSE UP TO THREE TIMES DAILY AS DIRECTED., Disp: 100 each, Rfl: 1   hydrochlorothiazide (HYDRODIURIL) 25 MG tablet, Take 1 tablet by mouth once daily, Disp: 90 tablet, Rfl: 0   Insulin Glargine (BASAGLAR KWIKPEN [place]), Inject 26 Units into the skin 2 (two) times daily., Disp: , Rfl:    Insulin Pen Needle (PEN NEEDLES) 32G X 5 MM MISC, 1 pen by Does not apply route daily. Use to inject insulin daily, Disp: 100 each, Rfl: 4   Lancets (ONETOUCH DELICA PLUS [id number]) MISC, USE   TO CHECK GLUCOSE UP TO THREE TIMES DAILY AS DIRECTED, Disp: 100 each, Rfl: 1   lisinopril (ZESTRIL) 40 MG tablet, TAKE 1 TABLET(40 MG) BY MOUTH DAILY, Disp: 90 tablet, Rfl: 3   metFORMIN (GLUCOPHAGE) 1000 MG tablet, TAKE 1 TABLET(1000 MG) BY MOUTH TWICE DAILY WITH A MEAL, Disp: 180 tablet, Rfl: 0   Oxcarbazepine (TRILEPTAL) 300 MG tablet, TAKE 1 TABLET BY MOUTH TWICE DAILY, Disp: 180 tablet, Rfl: 1   rosuvastatin (CRESTOR) 40 MG tablet, Take 1 tablet (40 mg total) by mouth daily., Disp: 90 tablet, Rfl: 3   sertraline (ZOLOFT) 100 MG tablet, Take 1.5 tablets (150 mg total) by mouth daily.,  Disp: 135 tablet, Rfl: 1   tiZANidine (ZANAFLEX) 4 MG tablet, TAKE 1 TABLET BY MOUTH EVERY 8 HOURS AS NEEDED FOR MUSCLE SPASM, Disp: 30 tablet, Rfl: 0   traZODone (DESYREL) 50 MG tablet, Take 2 tablets (100 mg total) by mouth at bedtime as needed for sleep., Disp: 30 tablet, Rfl: 0  EXAM:  VITALS per patient if applicable:  GENERAL: alert, oriented, appears well and in no acute distress  HEENT: atraumatic, conjunttiva clear, no obvious abnormalities on inspection of external nose and ears  NECK: normal movements of the head and neck  LUNGS: on inspection no signs of respiratory distress, breathing rate appears normal, no obvious gross SOB, gasping or wheezing  CV: no obvious cyanosis  MS: moves all visible extremities without noticeable abnormality  PSYCH/NEURO: pleasant and cooperative, no obvious depression or anxiety, speech and thought processing grossly intact  ASSESSMENT AND PLAN:  Discussed the following assessment and plan:  Problem List Items Addressed This Visit     Anxiety and depression (Chronic)    Patient has had some improvement.  We will have her try trazodone 100 mg nightly to see if that helps with her sleep.  Discussed the potential for seeing how she does with the trazodone increase versus also increasing Zoloft and she wanted to defer any further increases in the Zoloft.  She will continue Zoloft 150 mg daily.      Relevant Medications   traZODone (DESYREL) 50 MG tablet   Chronic low back pain (Location of Primary Source of Pain) (Bilateral) (L>R) (Chronic)    Chronic issue with recent worsening.  Discussed having her into the office to complete a exam and follow-up on this in person.  She will be scheduled for a couple weeks from now.  Discussed if she develops any acute changes in her strength she needs to seek medical attention immediately.  Patient did note she would not undergo another surgery for this.      Relevant Medications   traZODone (DESYREL)  50 MG tablet   Essential (primary) hypertension (Chronic)    Her single reported blood pressure was well controlled.  We will check this at her next visit.  She will continue amlodipine 5 mg daily, HCTZ 25 mg daily, and lisinopril 40 mg daily.      Trigeminal neuralgia (Chronic)    Asymptomatic.  She will continue Trileptal 300 mg twice daily.      Relevant Medications   traZODone (DESYREL) 50 MG tablet   Type 2 diabetes mellitus ([place]) (Chronic)    Reported CBGs seem to be well controlled.  She is due for an A1c.  We will check this at her next office visit in a couple of weeks.  She will continue metformin 1000 mg twice daily and Basaglar 26 units twice daily.       Return in about 2 weeks (around 06/12/2022).   I discussed the assessment and treatment plan with the patient. The patient was provided an opportunity to ask questions and all were answered. The patient agreed with the plan and demonstrated an understanding of the instructions.   The patient was advised to call back or seek an in-person evaluation if the symptoms worsen or if the condition fails to improve as anticipated.  Tommi Rumps, MD

## 2022-06-10 ENCOUNTER — Other Ambulatory Visit: Payer: Self-pay | Admitting: Family Medicine

## 2022-06-10 DIAGNOSIS — I10 Essential (primary) hypertension: Secondary | ICD-10-CM

## 2022-08-15 ENCOUNTER — Other Ambulatory Visit: Payer: Self-pay | Admitting: Family Medicine

## 2022-08-15 DIAGNOSIS — E1142 Type 2 diabetes mellitus with diabetic polyneuropathy: Secondary | ICD-10-CM

## 2022-09-09 ENCOUNTER — Other Ambulatory Visit: Payer: Self-pay | Admitting: Family Medicine

## 2022-09-09 DIAGNOSIS — I10 Essential (primary) hypertension: Secondary | ICD-10-CM

## 2022-09-21 ENCOUNTER — Other Ambulatory Visit: Payer: Self-pay | Admitting: Family Medicine

## 2022-09-21 DIAGNOSIS — F419 Anxiety disorder, unspecified: Secondary | ICD-10-CM

## 2022-10-28 ENCOUNTER — Other Ambulatory Visit: Payer: Self-pay | Admitting: Family Medicine

## 2022-10-28 DIAGNOSIS — F419 Anxiety disorder, unspecified: Secondary | ICD-10-CM

## 2022-10-28 NOTE — Telephone Encounter (Signed)
I called and LVM for patient to call back to schedule a follow up with the provider.  Arif Amendola,cma

## 2022-10-28 NOTE — Telephone Encounter (Signed)
I sent a refill in though she is due for follow-up. Please get her scheduled. Thanks.

## 2022-11-02 ENCOUNTER — Telehealth: Payer: Self-pay

## 2022-11-02 DIAGNOSIS — E119 Type 2 diabetes mellitus without complications: Secondary | ICD-10-CM

## 2022-11-02 NOTE — Patient Instructions (Signed)
Visit Information  Thank you for taking time to visit with me today. Please don't hesitate to contact me if I can be of assistance to you.   Following are the goals we discussed today:   Goals Addressed             This Visit's Progress    Medication assistance and diabetes information       Interventions Today    Flowsheet Row Most Recent Value  Chronic Disease   Chronic disease during today's visit Diabetes, Hypertension (HTN)  [Evaluation of current treatment plan related to diabetes and hypertension and patients adherence to plan as established by provider.]  General Interventions   General Interventions Discussed/Reviewed General Interventions Discussed, Labs, Doctor Visits, Vaccines, Annual Foot Exam  [Discussed need to have Hgb A1c and provided explanation of HgbA1c. Reviewed previous A1c results. Advised to discuss need for pneumonia and shingles vaccine with pcp. Discussed importance of having frequent foot exams by provider.]  Labs Hgb A1c every 6 months  Vaccines Pneumonia, Shingles  Doctor Visits Discussed/Reviewed Doctor Visits Discussed  [Advised patient to call primary care provider office and schedule annual physical exam.]  Education Interventions   Education Provided Provided Printed Education  [Education information on diabetes/ HgbA1c sent to patient in MyChart.]  Pharmacy Interventions   Pharmacy Dicussed/Reviewed Pharmacy Topics Reviewed, Referral to Pharmacist  Referral to Pharmacist Cannot afford medications  [unable to afford basalgar kwikpen]              Our next appointment is by telephone on 06/02/23 at 2 pm  Please call the care guide team at [phone] if you need to cancel or reschedule your appointment.   If you are experiencing a Mental Health or [place] or need someone to talk to, please call the Suicide and Crisis Lifeline: 988 call 1-800-273-TALK (toll free, 24 hour hotline)  Patient verbalizes understanding of  instructions and care plan provided today and agrees to view in [place]. Active MyChart status and patient understanding of how to access instructions and care plan via MyChart confirmed with patient.     Quinn Plowman RN,BSN,CCM [address] Coordination [phone] direct line

## 2022-11-02 NOTE — Patient Outreach (Signed)
  Care Coordination   Initial Visit Note   11/02/2022 Name: Tammy Acosta MRN: [id number] DOB: [birth year]  Tammy Acosta is a 67 y.o. year old female who sees Caryl Bis, Angela Adam, MD for primary care. I spoke with  Tammy Acosta by phone today.  What matters to the patients health and wellness today?  Patient states she is not able to afford her [employer] pen.  She states $125 is needed initially then it will cost $79 per month.  Patient states she stopped taking her hydrochlorothiazide  because she was trying to afford other medications. She states she is on Lisinopril that she takes regularly.   Patient unsure whether she needed ongoing follow up with RNCM.  Agreeable to follow up with RNCM to confirm contact and follow through with pharmacy referral.    Goals Addressed             This Visit's Progress    Medication assistance and diabetes information       Interventions Today    Flowsheet Row Most Recent Value  Chronic Disease   Chronic disease during today's visit Diabetes, Hypertension (HTN)  [Evaluation of current treatment plan related to diabetes and hypertension and patients adherence to plan as established by provider.]  General Interventions   General Interventions Discussed/Reviewed General Interventions Discussed, Labs, Doctor Visits, Vaccines, Annual Foot Exam  [Discussed need to have Hgb A1c and provided explanation of HgbA1c. Reviewed previous A1c results. Advised to discuss need for pneumonia and shingles vaccine with pcp. Discussed importance of having frequent foot exams by provider.]  Labs Hgb A1c every 6 months  Vaccines Pneumonia, Shingles  Doctor Visits Discussed/Reviewed Doctor Visits Discussed  [Advised patient to call primary care provider office and schedule annual physical exam.]  Education Interventions   Education Provided Provided Printed Education  [Education information on diabetes/ HgbA1c sent to patient in MyChart.]  Pharmacy  Interventions   Pharmacy Dicussed/Reviewed Pharmacy Topics Reviewed, Referral to Pharmacist  Referral to Pharmacist Cannot afford medications  [unable to afford basalgar kwikpen]              SDOH assessments and interventions completed:  Yes  SDOH Interventions Today    Flowsheet Row Most Recent Value  SDOH Interventions   Food Insecurity Interventions Intervention Not Indicated  Housing Interventions Intervention Not Indicated  Transportation Interventions Intervention Not Indicated        Care Coordination Interventions:  Yes, provided   Follow up plan: Follow up call scheduled for 12/01/22    Encounter Outcome:  Pt. Visit Completed   Quinn Plowman RN,BSN,CCM [place] [phone] direct line

## 2022-11-03 NOTE — Addendum Note (Signed)
Addended by: Quinn Plowman E on: 11/03/2022 03:45 PM   Modules accepted: Orders

## 2022-11-05 ENCOUNTER — Telehealth: Payer: Self-pay

## 2022-11-05 NOTE — Progress Notes (Signed)
   Care Guide Note  11/05/2022 Name: Tammy Acosta MRN: [id number] DOB: 1956-04-07  Referred by: Leone Haven, MD Reason for referral : Care Coordination (Outreach to schedule with Pharm d )   Tammy Acosta is a 67 y.o. year old female who is a primary care patient of Caryl Bis, Angela Adam, MD. Tammy Acosta was referred to the pharmacist for assistance related to DM.    Successful contact was made with the patient to discuss pharmacy services including being ready for the pharmacist to call at least 5 minutes before the scheduled appointment time, to have medication bottles and any blood sugar or blood pressure readings ready for review. The patient agreed to meet with the pharmacist via with the pharmacist via telephone visit on (date/time).  11/30/2022  Noreene Larsson, [place], [place] [address] Direct Dial: [phone] [email or web address]

## 2022-11-10 ENCOUNTER — Other Ambulatory Visit: Payer: Self-pay | Admitting: Family

## 2022-11-10 DIAGNOSIS — E1142 Type 2 diabetes mellitus with diabetic polyneuropathy: Secondary | ICD-10-CM

## 2022-11-16 ENCOUNTER — Other Ambulatory Visit: Payer: Self-pay

## 2022-11-16 ENCOUNTER — Telehealth: Payer: Self-pay | Admitting: Family Medicine

## 2022-11-16 DIAGNOSIS — E1142 Type 2 diabetes mellitus with diabetic polyneuropathy: Secondary | ICD-10-CM

## 2022-11-16 MED ORDER — METFORMIN HCL 1000 MG PO TABS: ORAL_TABLET | ORAL | 0 refills | Status: DC

## 2022-11-16 NOTE — Telephone Encounter (Signed)
Prescription Request  11/16/2022  LOV: 12/26/2021   PATIENT ALMOST OUT OF MEDICATION  What is the name of the medication or equipment? metFORMIN (GLUCOPHAGE) 1000 MG tablet   Have you contacted your pharmacy to request a refill? Yes   Which pharmacy would you like this sent to?  [id number] - [place], [place] - [address] [place] [address] Phone: [phone] Fax: [phone]    Patient notified that their request is being sent to the clinical staff for review and that they should receive a response within 2 business days.   Please advise at [facility] [phone]

## 2022-11-21 ENCOUNTER — Other Ambulatory Visit: Payer: Self-pay | Admitting: Family Medicine

## 2022-11-23 ENCOUNTER — Other Ambulatory Visit: Payer: Self-pay | Admitting: Family Medicine

## 2022-11-23 DIAGNOSIS — Z1231 Encounter for screening mammogram for malignant neoplasm of breast: Secondary | ICD-10-CM

## 2022-11-26 ENCOUNTER — Telehealth: Payer: Self-pay

## 2022-11-26 NOTE — Telephone Encounter (Signed)
Left  detailed vm for pt to CB to get scheduled with PCP for refill on medication pt was last seen in office 12/26/21

## 2022-11-26 NOTE — Telephone Encounter (Signed)
LMOM to CB to get scheduled pt hasn't been in office 12/26/21 coming up on a year soon.

## 2022-11-29 ENCOUNTER — Other Ambulatory Visit: Payer: Self-pay | Admitting: Family Medicine

## 2022-11-29 DIAGNOSIS — F419 Anxiety disorder, unspecified: Secondary | ICD-10-CM

## 2022-11-30 ENCOUNTER — Other Ambulatory Visit: Payer: Medicare Other | Admitting: Pharmacist

## 2022-11-30 ENCOUNTER — Telehealth: Payer: Self-pay | Admitting: Pharmacist

## 2022-11-30 NOTE — Progress Notes (Unsigned)
Attempted to contact patient for scheduled appointment for medication management. Left HIPAA compliant message for patient to return my call at their convenience.    Catie T. Reyonna Haack, PharmD, BCACP, CPP [facility] [phone]  

## 2022-12-01 ENCOUNTER — Ambulatory Visit: Payer: Self-pay

## 2022-12-01 NOTE — Patient Outreach (Signed)
  Care Coordination   12/01/2022 Name: Tammy Acosta MRN: [id number] DOB: 11-03-1955   Care Coordination Outreach Attempts:  An unsuccessful telephone outreach was attempted for a scheduled appointment today. Unable to reach patient. HIPAA compliant voice message left with call back phone number.   Follow Up Plan:  Additional outreach attempts will be made to offer the patient care coordination information and services.   Encounter Outcome:  No Answer   Care Coordination Interventions:  No, not indicated    George Ina [facility] Care Coordination [phone] direct line

## 2022-12-08 ENCOUNTER — Telehealth: Payer: Self-pay | Admitting: *Deleted

## 2022-12-08 NOTE — Progress Notes (Signed)
  Care Coordination Note  12/08/2022 Name: Tammy Acosta MRN: [id number] DOB: [birth year]  Tammy Acosta is a 67 y.o. year old female who is a primary care patient of Glori Luis, MD and is actively engaged with the care management team. I reached out to Tammy Acosta by phone today to assist with re-scheduling a follow up visit with the RN Case Manager  Follow up plan: Unsuccessful telephone outreach attempt made. A HIPAA compliant phone message was left for the patient providing contact information and requesting a return call.   Burman Nieves, CCMA Care Coordination Care Guide Direct Dial: [phone]

## 2022-12-15 ENCOUNTER — Other Ambulatory Visit: Payer: Self-pay | Admitting: Family Medicine

## 2022-12-15 DIAGNOSIS — E785 Hyperlipidemia, unspecified: Secondary | ICD-10-CM

## 2022-12-15 DIAGNOSIS — I10 Essential (primary) hypertension: Secondary | ICD-10-CM

## 2022-12-15 NOTE — Progress Notes (Signed)
  Care Coordination Note  12/15/2022 Name: Tammy Acosta MRN: [id number] DOB: 07/11/1956  Tammy Acosta is a 67 y.o. year old female who is a primary care patient of Glori Luis, MD and is actively engaged with the care management team. I reached out to Tammy Acosta by phone today to assist with re-scheduling a follow up visit with the RN Case Manager  Follow up plan: We have been unable to make contact with the patient for follow up.   Burman Nieves, CCMA Care Coordination Care Guide Direct Dial: [phone]

## 2022-12-27 ENCOUNTER — Other Ambulatory Visit: Payer: Self-pay | Admitting: Family Medicine

## 2022-12-27 DIAGNOSIS — F419 Anxiety disorder, unspecified: Secondary | ICD-10-CM

## 2022-12-28 ENCOUNTER — Other Ambulatory Visit: Payer: Medicare Other | Admitting: Pharmacist

## 2022-12-28 MED ORDER — FREESTYLE LIBRE 3 READER DEVI: 1.0000 | Freq: Every day | 0 refills | Status: DC

## 2022-12-28 MED ORDER — FREESTYLE LIBRE 3 SENSOR MISC: 11 refills | Status: DC

## 2022-12-28 NOTE — Progress Notes (Unsigned)
12/28/2022 Name: Tammy Acosta MRN: [id number] DOB: [birth year]  Chief Complaint  Patient presents with   Medication Management   Diabetes    Tammy Acosta is a 67 y.o. year old female who presented for a telephone visit.   They were referred to the pharmacist by their PCP for assistance in managing diabetes and medication access.   Subjective:  Care Team: Primary Care Provider: Glori Luis, MD ; Next Scheduled Visit: needs to schedule  Medication Access/Adherence  Current Pharmacy:  [id number] [place], [place] - [address] AT [facility] [address] [facility] [address] [place] [address] Phone: [phone] Fax: [phone]   Patient reports affordability concerns with their medications: Yes  Patient reports access/transportation concerns to their pharmacy: No  Patient reports adherence concerns with their medications:  No     Diabetes:  Current medications: metformin 1000 mg twice daily, Basaglar 53 units daily  Current glucose readings: fastings: 90-110s; pre-lunch/supper: 120s  Patient reports occasional symptoms of hypoglycemic s/sx including dizziness, shakiness, sweating. Reports a history of hypoglycemic episodes to <70  Current meal patterns:  - Breakfast: typically skips, sometimes toast  - Lunch: generally skips, sometimes crackers;  - Supper: usually around 7 pm; meatloaf, green peas, salad; potato soup;  - Drinks: soft drinks - Mt Dew- midday; otherwise water   Current physical activity: [place]; 30-45 minutes 3 days a week   Current medication access support: previously on Basaglar patient assistance  Hypertension:  Current medications: lisinopril 40 mg daily, amlodipine 5 mg daily, HCTZ 25 mg daily- has not been taking for several months  Hyperlipidemia/ASCVD Risk Reduction  Current lipid lowering medications: ezetimibe 10 mg daily, rosuvastatin 40 mg daily     Depression/Anxiety & Pain:  Current medications: sertraline 150 mg daily, trazodone 100 mg HS PRN    Trigeminal Neuralgia:  Current medications: gabapentin 800 mg up to four times daily, generally taking three times daily; reports that she stopped taking oxycarbazepine due to the cost, but reports it was sometimes helpful  Objective:  Lab Results  Component Value Date   HGBA1C 6.8 (H) 11/13/2020    Lab Results  Component Value Date   CREATININE 0.85 11/13/2020   BUN 25 (H) 11/13/2020   NA 139 11/13/2020   K 4.3 11/13/2020   CL 102 11/13/2020   CO2 27 11/13/2020    Lab Results  Component Value Date   CHOL 244 (H) 11/13/2020   HDL 43.50 11/13/2020   LDLCALC 37 01/29/2020   LDLDIRECT 149.0 11/13/2020   TRIG 319.0 (H) 11/13/2020   CHOLHDL 6 11/13/2020    Medications Reviewed Today     Reviewed by Alden Hipp, RPH-CPP (Pharmacist) on 12/28/22 at 1032  Med List Status: <None>   Medication Order Taking? Sig Documenting Provider Last Dose Status Informant  amLODipine (NORVASC) 5 MG tablet [id number] Yes TAKE 1 TABLET(5 MG) BY MOUTH DAILY Glori Luis, MD Taking Active   blood glucose meter kit and supplies [id number]  Dispense One touch meter.  E11.9 Glori Luis, MD  Active   Blood Glucose Monitoring Suppl (ONE TOUCH ULTRA 2) w/Device KIT [id number]  Dispense 1 meter to use to test blood glucose once daily. Dx code: E11.42. Glori Luis, MD  Active   ezetimibe (ZETIA) 10 MG tablet [id number] Yes TAKE 1 TABLET(10 MG) BY MOUTH DAILY Birdie Sons Yehuda Mao, MD Taking Active   gabapentin (NEURONTIN)  800 MG tablet [id number] Yes Take 1 tablet (800 mg total) by mouth 4 (four) times daily. Glori Luis, MD Taking Active            Med Note Clearance Coots, Mel Almond Dec 28, 2022 10:26 AM) Generally taking three times daily  glucose blood (ONETOUCH ULTRA) test strip [id number]  USE 1 STRIP TO CHECK GLUCOSE UP TO THREE TIMES DAILY AS DIRECTED. Glori Luis, MD  Active   hydrochlorothiazide (HYDRODIURIL) 25 MG tablet [id number] No Take 1 tablet by mouth once daily  Patient not taking: Reported on 11/02/2022   Glori Luis, MD Not Taking Active   Insulin Glargine Mariella Saa [facility] [place]) [id number] Yes Inject 26 Units into the skin 2 (two) times daily. [provider] Taking Active            Med Note Clearance Coots, Mel Almond Dec 28, 2022 10:31 AM) 53 units daily  Insulin Pen Needle (PEN NEEDLES) 32G X 5 MM MISC [id number]  1 pen by Does not apply route daily. Use to inject insulin daily Glori Luis, MD  Active   Lancets [facility] [place] PLUS [place]) MISC [id number]  USE   TO CHECK GLUCOSE UP TO THREE TIMES DAILY AS DIRECTED Glori Luis, MD  Active   lisinopril (ZESTRIL) 40 MG tablet [id number] Yes TAKE 1 TABLET(40 MG) BY MOUTH DAILY Glori Luis, MD Taking Active   metFORMIN (GLUCOPHAGE) 1000 MG tablet [id number] Yes TAKE 1 TABLET(1000 MG) BY MOUTH TWICE DAILY WITH A MEAL Glori Luis, MD Taking Active   Oxcarbazepine (TRILEPTAL) 300 MG tablet [id number] No TAKE 1 TABLET BY MOUTH TWICE DAILY  Patient not taking: Reported on 11/02/2022   Glori Luis, MD Not Taking Active   rosuvastatin (CRESTOR) 40 MG tablet [id number] Yes TAKE 1 TABLET(40 MG) BY MOUTH DAILY Glori Luis, MD Taking Active   sertraline (ZOLOFT) 100 MG tablet [id number] Yes TAKE 1 AND 1/2 TABLETS(150 MG) BY MOUTH DAILY Glori Luis, MD Taking Active   tiZANidine (ZANAFLEX) 4 MG tablet [id number]  TAKE 1 TABLET BY MOUTH EVERY 8 HOURS AS NEEDED FOR MUSCLE SPASM Glori Luis, MD  Active   traZODone (DESYREL) 50 MG tablet [id number] Yes Take 2 tablets (100 mg total) by mouth at bedtime as needed for sleep. Glori Luis, MD Taking Active               Assessment/Plan:   Diabetes: - Currently uncontrolled - Reviewed long term cardiovascular and renal outcomes of uncontrolled blood sugar - Reviewed goal A1c, goal  fasting, and goal 2 hour post prandial glucose - Reviewed dietary modifications including: encouraged to reduce Mt Dew  - Reviewed lifestyle modifications including: increase physical activity to 50 minutes 3 times a week - Recommend to continue current regimen. Discussed CGM. Patient interested. Script sent to determine coverage. PA completed via Cover My Meds  Hypertension: - Currently controlled at last office visit, unclear if taking HCTZ at that time.  - Continue current regimen. Will follow BP moving forward.   Hyperlipidemia/ASCVD Risk Reduction: - Currently uncontrolled, unclear if related to adherence. Patient has filled rosuvastatin recently. Will continue to follow to support adherence.   Follow Up Plan: phone call in ~4 weeks  Catie Eppie Gibson, PharmD, BCACP, CPP [facility] [phone]

## 2023-01-02 ENCOUNTER — Telehealth: Payer: Self-pay

## 2023-01-02 NOTE — Telephone Encounter (Signed)
Prior Auth for [employer] sensor was already previously approved   Varina Frasch (Key: [id number])   Outcome Additional Information Required This medication or product was previously approved on [id number] from 2022-12-29 to 2023-08-24. **Please note: This request was submitted electronically. Formulary lowering, tiering exception, cost reduction and/or pre-benefit determination review (including prospective Medicare hospice reviews) requests cannot be requested using this method of submission. Providers contact us at [phone] for further assistance.

## 2023-01-02 NOTE — Telephone Encounter (Signed)
Prior Auth for [employer] 3 reader   Terena Blueford (Key: [id number])   Information regarding your request This medication or product was previously approved on [id number] from 2022-12-29 to 2023-08-24. **Please note: This request was submitted electronically. Formulary lowering, tiering exception, cost reduction and/or pre-benefit determination review (including prospective Medicare hospice reviews) requests cannot be requested using this method of submission. Providers contact us at [phone] for further assistance.

## 2023-01-05 ENCOUNTER — Telehealth: Payer: Self-pay

## 2023-01-05 ENCOUNTER — Other Ambulatory Visit (HOSPITAL_COMMUNITY): Payer: Self-pay

## 2023-01-05 NOTE — Telephone Encounter (Signed)
-----   Message from Alden Hipp, RPH-CPP sent at 12/30/2022  2:55 PM EDT ----- Mariella Saa! Can you start the process for assistance?  Thanks! ----- Message ----- From: Otho Najjar, CPhT Sent: 12/30/2022   2:02 PM EDT To: Alden Hipp, RPH-CPP  Good afternoon!  The automated system didn't recognize the patient. What medication was she needing assistance with?  ----- Message ----- From: Alden Hipp, RPH-CPP Sent: 12/28/2022  10:49 AM EDT To: Rx Med Assistance Team  Can we call [name] and see if she is enrolled for 2024? She said they told her something was needed from her, I'm thinking it's a new application but she wasn't sure.   If she does need to reapply, can we please mail her an application? Thanks!  Catie

## 2023-01-05 NOTE — Telephone Encounter (Signed)
Mailed Solicitor to pt's home for [employer].

## 2023-01-06 NOTE — Telephone Encounter (Signed)
Left message to call the office to let her know the Prior Authorization has been approved through 08/24/23.

## 2023-01-06 NOTE — Telephone Encounter (Signed)
LMTCB

## 2023-01-07 NOTE — Telephone Encounter (Signed)
Called [phone] and a man answered and said this is Not Tammy Acosta's number stop calling here for her. Called the other number and LMTCB

## 2023-01-07 NOTE — Telephone Encounter (Signed)
Left message to call back regarding her Prior Authorization has been approved through 08/24/2023

## 2023-01-23 ENCOUNTER — Other Ambulatory Visit: Payer: Self-pay | Admitting: Family Medicine

## 2023-01-23 DIAGNOSIS — B0222 Postherpetic trigeminal neuralgia: Secondary | ICD-10-CM

## 2023-01-23 DIAGNOSIS — M792 Neuralgia and neuritis, unspecified: Secondary | ICD-10-CM

## 2023-01-25 NOTE — Telephone Encounter (Signed)
1026/23 last virtual visit , last in ofice visit 5/23 okayto fill?

## 2023-01-26 NOTE — Telephone Encounter (Signed)
I sent in one refill. Patient will need to be seen in the office for additional refills to be given.

## 2023-01-27 NOTE — Telephone Encounter (Signed)
Rec'd pt pages back. Faxed pcp pages to office.

## 2023-02-02 ENCOUNTER — Other Ambulatory Visit: Payer: Self-pay | Admitting: Pharmacist

## 2023-02-02 MED ORDER — BASAGLAR KWIKPEN 100 UNIT/ML ~~LOC~~ SOPN: 54.0000 [IU] | PEN_INJECTOR | Freq: Every day | SUBCUTANEOUS | 2 refills | Status: DC

## 2023-02-02 NOTE — Telephone Encounter (Signed)
Sent mychart message

## 2023-02-02 NOTE — Telephone Encounter (Signed)
Pcp pages rec'd in fax.

## 2023-02-02 NOTE — Telephone Encounter (Signed)
Yes I faxed it to [place] at [phone]

## 2023-02-03 ENCOUNTER — Other Ambulatory Visit (HOSPITAL_COMMUNITY): Payer: Self-pay

## 2023-02-03 NOTE — Progress Notes (Signed)
Care Coordination Call  Appointment rescheduled, but patient requested a refill on Basaglar to tide her over until patient assistance process is completed. Order sent.   Catie Eppie Gibson, PharmD, BCACP, CPP [facility] [phone]

## 2023-02-03 NOTE — Telephone Encounter (Signed)
Submitted application for BASAGLAR KWIKPEN to [name] for patient assistance.   Phone: [phone]  

## 2023-02-08 ENCOUNTER — Other Ambulatory Visit: Payer: Medicare Other | Admitting: Pharmacist

## 2023-02-16 ENCOUNTER — Telehealth: Payer: Self-pay | Admitting: Family Medicine

## 2023-02-16 DIAGNOSIS — E1142 Type 2 diabetes mellitus with diabetic polyneuropathy: Secondary | ICD-10-CM

## 2023-02-18 NOTE — Telephone Encounter (Signed)
The patient has not had labs in over a year. She needs an appointment scheduled with me in person for this to be filled. Once she has scheduled an appointment enough medication can be sent in to cover until she is seen.

## 2023-02-18 NOTE — Telephone Encounter (Signed)
Pt called for her refills. Note below was read to her. Pt its book for tomorrow 02/19/23 to see Birdie Sons.

## 2023-02-18 NOTE — Telephone Encounter (Signed)
No labs or visit please advise refill. Requested patient needs appointment through my chart and call is seeing Pharm D please advise to refill.

## 2023-02-18 NOTE — Telephone Encounter (Signed)
Noted  

## 2023-02-19 ENCOUNTER — Ambulatory Visit: Payer: Medicare Other | Admitting: Family Medicine

## 2023-02-24 NOTE — Telephone Encounter (Signed)
Received notification from [facility] [employer] regarding approval for [facility]. Patient assistance approved until 08/24/23.  Medication will ship to patients home.  Phone: [phone]

## 2023-02-25 ENCOUNTER — Other Ambulatory Visit: Payer: Self-pay | Admitting: Family Medicine

## 2023-02-25 DIAGNOSIS — I10 Essential (primary) hypertension: Secondary | ICD-10-CM

## 2023-03-02 ENCOUNTER — Other Ambulatory Visit: Payer: Medicare Other | Admitting: Pharmacist

## 2023-03-04 ENCOUNTER — Other Ambulatory Visit: Payer: Self-pay | Admitting: Family Medicine

## 2023-03-04 DIAGNOSIS — E1142 Type 2 diabetes mellitus with diabetic polyneuropathy: Secondary | ICD-10-CM

## 2023-03-10 ENCOUNTER — Ambulatory Visit: Payer: Medicare Other | Admitting: *Deleted

## 2023-03-10 VITALS — Ht 62.0 in | Wt 158.0 lb

## 2023-03-10 DIAGNOSIS — Z0000 Encounter for general adult medical examination without abnormal findings: Secondary | ICD-10-CM | POA: Diagnosis not present

## 2023-03-10 NOTE — Progress Notes (Signed)
Subjective:   Tammy Acosta is a 66 y.o. female who presents for Medicare Annual (Subsequent) preventive examination.  Visit Complete: Virtual  I connected with  Tammy Acosta on 03/10/23 by a audio enabled telemedicine application and verified that I am speaking with the correct person using two identifiers.  Patient Location: Home  Provider Location: Office/Clinic  I discussed the limitations of evaluation and management by telemedicine. The patient expressed understanding and agreed to proceed.  Review of Systems     Cardiac Risk Factors include: advanced age (>21men, >67 women);diabetes mellitus;hypertension;dyslipidemia     Objective:    Today's Vitals   03/10/23 1424  Weight: 158 lb (71.7 kg)  Height: 5\' 2"  (1.575 m)  PainSc: 5    Body mass index is 28.9 kg/m.     03/10/2023    2:43 PM 01/28/2022   10:40 AM 11/05/2020    1:12 PM 11/03/2019    1:15 PM 11/02/2018   10:04 AM 11/06/2016    4:12 PM 09/24/2016    1:28 PM  Advanced Directives  Does Patient Have a Medical Advance Directive? No No No No No No No  Does patient want to make changes to medical advance directive?     No - Patient declined    Would patient like information on creating a medical advance directive?  No - Patient declined No - Patient declined No - Patient declined  Yes (MAU/Ambulatory/Procedural Areas - Information given) No - Patient declined    Current Medications (verified) Outpatient Encounter Medications as of 03/10/2023  Medication Sig   amLODipine (NORVASC) 5 MG tablet TAKE 1 TABLET(5 MG) BY MOUTH DAILY   blood glucose meter kit and supplies Dispense One touch meter.  E11.9   Blood Glucose Monitoring Suppl (ONE TOUCH ULTRA 2) w/Device KIT Dispense 1 meter to use to test blood glucose once daily. Dx code: E11.42.   Continuous Glucose Receiver (FREESTYLE LIBRE 3 READER) DEVI 1 each by Does not apply route daily. Use to check glucose continuously.   Continuous Glucose Sensor (FREESTYLE  LIBRE 3 SENSOR) MISC Place 1 sensor on the skin every 14 days. Use to check glucose continuously   ezetimibe (ZETIA) 10 MG tablet TAKE 1 TABLET(10 MG) BY MOUTH DAILY   gabapentin (NEURONTIN) 800 MG tablet TAKE 1 TABLET(800 MG) BY MOUTH FOUR TIMES DAILY   glucose blood (ONETOUCH ULTRA) test strip USE 1 STRIP TO CHECK GLUCOSE UP TO THREE TIMES DAILY AS DIRECTED.   Insulin Glargine (BASAGLAR KWIKPEN) 100 UNIT/ML Inject 54 Units into the skin daily.   Insulin Pen Needle (PEN NEEDLES) 32G X 5 MM MISC 1 pen by Does not apply route daily. Use to inject insulin daily   Lancets (ONETOUCH DELICA PLUS LANCET33G) MISC USE   TO CHECK GLUCOSE UP TO THREE TIMES DAILY AS DIRECTED   lisinopril (ZESTRIL) 40 MG tablet TAKE 1 TABLET(40 MG) BY MOUTH DAILY   metFORMIN (GLUCOPHAGE) 1000 MG tablet TAKE 1 TABLET(1000 MG) BY MOUTH TWICE DAILY WITH A MEAL   Oxcarbazepine (TRILEPTAL) 300 MG tablet TAKE 1 TABLET BY MOUTH TWICE DAILY   rosuvastatin (CRESTOR) 40 MG tablet TAKE 1 TABLET(40 MG) BY MOUTH DAILY   sertraline (ZOLOFT) 100 MG tablet TAKE 1 AND 1/2 TABLETS(150 MG) BY MOUTH DAILY   tiZANidine (ZANAFLEX) 4 MG tablet TAKE 1 TABLET BY MOUTH EVERY 8 HOURS AS NEEDED FOR MUSCLE SPASM   traZODone (DESYREL) 50 MG tablet Take 2 tablets (100 mg total) by mouth at bedtime as needed for sleep.  hydrochlorothiazide (HYDRODIURIL) 25 MG tablet Take 1 tablet by mouth once daily (Patient not taking: Reported on 03/10/2023)   No facility-administered encounter medications on file as of 03/10/2023.    Allergies (verified) Penicillins, Meloxicam, and Tetracyclines & related   History: Past Medical History:  Diagnosis Date   Altered mental status 02/04/2014   Arthritis    Colon polyps    Depression    Diabetes mellitus without complication (HCC)    Pt states she takes Insulin and metformin.   Hypercholesteremia    Hyperlipidemia    Hypertension    Shingles    10/17   Spine disorder    Trigeminal herpes zoster (left V1  distribution) 06/24/2016   Viral upper respiratory illness 12/03/2015   Past Surgical History:  Procedure Laterality Date   ABDOMINAL HYSTERECTOMY     BACK SURGERY     CHOLECYSTECTOMY     KYPHOPLASTY     Family History  Problem Relation Age of Onset   Heart disease Mother    Hypertension Mother    Cancer Mother        breast   Breast cancer Mother        early 74's   Heart disease Father    Arthritis Other        Parent, other relative   Colon cancer Other        Parent   Lung cancer Other        Other relative   Diabetes Other        Other relative   Uterine cancer Other        Other relative   Cancer Maternal Aunt        breast   Breast cancer Maternal Aunt    Cancer Maternal Aunt        ovarian   Breast cancer Cousin    Breast cancer Cousin    Breast cancer Cousin    Social History   Socioeconomic History   Marital status: Divorced    Spouse name: Not on file   Number of children: Not on file   Years of education: Not on file   Highest education level: Not on file  Occupational History   Not on file  Tobacco Use   Smoking status: Every Day    Current packs/day: 0.00    Types: Cigarettes   Smokeless tobacco: Never  Substance and Sexual Activity   Alcohol use: No   Drug use: No   Sexual activity: Never  Other Topics Concern   Not on file  Social History Narrative   Not on file   Social Determinants of Health   Financial Resource Strain: Low Risk  (03/10/2023)   Overall Financial Resource Strain (CARDIA)    Difficulty of Paying Living Expenses: Not hard at all  Food Insecurity: No Food Insecurity (03/10/2023)   Hunger Vital Sign    Worried About Running Out of Food in the Last Year: Never true    Ran Out of Food in the Last Year: Never true  Transportation Needs: No Transportation Needs (03/10/2023)   PRAPARE - Administrator, Civil Service (Medical): No    Lack of Transportation (Non-Medical): No  Physical Activity: Insufficiently  Active (03/10/2023)   Exercise Vital Sign    Days of Exercise per Week: 3 days    Minutes of Exercise per Session: 20 min  Stress: No Stress Concern Present (03/10/2023)   [name] of Occupational Health - Occupational Stress Questionnaire  Feeling of Stress : Not at all  Social Connections: Socially Isolated (03/10/2023)   Social Connection and Isolation Panel [NHANES]    Frequency of Communication with Friends and Family: More than three times a week    Frequency of Social Gatherings with Friends and Family: More than three times a week    Attends Religious Services: Never    Database administrator or Organizations: No    Attends Engineer, structural: Never    Marital Status: Divorced    Tobacco Counseling Ready to quit: Not Answered Counseling given: Not Answered   Clinical Intake:  Pre-visit preparation completed: Yes  Pain : 0-10 Pain Score: 5  Pain Type: Chronic pain Pain Location: Back Pain Orientation: Lower Pain Descriptors / Indicators: Constant Pain Onset: More than a month ago Pain Frequency: Constant     BMI - recorded: 28.9 Nutritional Status: BMI 25 -29 Overweight Nutritional Risks: None Diabetes: Yes CBG done?: No Did pt. bring in CBG monitor from home?: No  How often do you need to have someone help you when you read instructions, pamphlets, or other written materials from your doctor or pharmacy?: 1 - Never  Interpreter Needed?: No  Information entered by :: R. Claudell Rhody LPN   Activities of Daily Living    03/10/2023    2:27 PM  In your present state of health, do you have any difficulty performing the following activities:  Hearing? 0  Vision? 0  Comment readers  Difficulty concentrating or making decisions? 1  Walking or climbing stairs? 1  Dressing or bathing? 0  Doing errands, shopping? 0  Preparing Food and eating ? N  Using the Toilet? N  In the past six months, have you accidently leaked urine? N  Do you have  problems with loss of bowel control? N  Managing your Medications? N  Managing your Finances? N  Housekeeping or managing your Housekeeping? N    Patient Care Team: Glori Luis, MD as PCP - General (Family Medicine)  Indicate any recent Medical Services you may have received from other than [facility] providers in the past year (date may be approximate).     Assessment:   This is a routine wellness examination for Tammy Acosta.  Hearing/Vision screen Hearing Screening - Comments:: No issues Vision Screening - Comments:: glasses  Dietary issues and exercise activities discussed:     Goals Addressed             This Visit's Progress    Patient Stated       None       Depression Screen    03/10/2023    2:36 PM 05/29/2022   12:08 PM 01/28/2022   10:38 AM 12/26/2021    3:37 PM 11/05/2020    1:06 PM 11/03/2019    1:15 PM 06/21/2019    2:48 PM  PHQ 2/9 Scores  PHQ - 2 Score 0 0 1 0 1 1 0  PHQ- 9 Score 1          Fall Risk    03/10/2023    2:40 PM 05/29/2022   12:08 PM 01/28/2022   10:41 AM 12/26/2021    3:37 PM 11/05/2020    1:12 PM  Fall Risk   Falls in the past year? 1 0 0 0 0  Number falls in past yr: 0 0  0 0  Injury with Fall? 0 0  0 0  Risk for fall due to : No Fall Risks;History of fall(s);Medication side effect  No Fall Risks  No Fall Risks   Follow up Falls prevention discussed;Falls evaluation completed Falls evaluation completed Falls evaluation completed Falls evaluation completed Falls evaluation completed    MEDICARE RISK AT HOME:  Medicare Risk at Home - 03/10/23 1440     Any stairs in or around the home? Yes    If so, are there any without handrails? Yes    Home free of loose throw rugs in walkways, pet beds, electrical cords, etc? Yes    Adequate lighting in your home to reduce risk of falls? Yes    Life alert? No    Use of a cane, walker or w/c? Yes   cane   Grab bars in the bathroom? Yes    Shower chair or bench in shower? No    Elevated toilet seat  or a handicapped toilet? No                 Cognitive Function:    11/06/2016    4:47 PM  MMSE - Mini Mental State Exam  Orientation to time 5  Orientation to Place 5  Registration 3  Attention/ Calculation 5  Recall 3  Language- name 2 objects 2  Language- repeat 1  Language- follow 3 step command 3  Language- read & follow direction 1  Write a sentence 1  Copy design 1  Total score 30        03/10/2023    2:43 PM 01/28/2022   10:48 AM 11/03/2019    1:32 PM 11/02/2018   10:05 AM  6CIT Screen  What Year? 0 points 0 points 0 points 0 points  What month? 0 points 0 points 0 points 0 points  What time? 0 points 0 points 0 points 0 points  Count back from 20 0 points 0 points 0 points 0 points  Months in reverse 2 points 2 points 0 points 0 points  Repeat phrase 10 points 0 points 0 points 0 points  Total Score 12 points 2 points 0 points 0 points    Immunizations Immunization History  Administered Date(s) Administered   Influenza,inj,Quad PF,6+ Mos 10/30/2015, 07/01/2017, 07/15/2018   Pneumococcal Polysaccharide-23 10/30/2015   Tdap 02/03/2014    TDAP status: Up to date  Flu Vaccine status: Declined, Education has been provided regarding the importance of this vaccine but patient still declined. Advised may receive this vaccine at local pharmacy or Health Dept. Aware to provide a copy of the vaccination record if obtained from local pharmacy or Health Dept. Verbalized acceptance and understanding.  Pneumococcal vaccine status: Due, Education has been provided regarding the importance of this vaccine. Advised may receive this vaccine at local pharmacy or Health Dept. Aware to provide a copy of the vaccination record if obtained from local pharmacy or Health Dept. Verbalized acceptance and understanding.  Covid-19 vaccine status: Declined, Education has been provided regarding the importance of this vaccine but patient still declined. Advised may receive this vaccine  at local pharmacy or Health Dept.or vaccine clinic. Aware to provide a copy of the vaccination record if obtained from local pharmacy or Health Dept. Verbalized acceptance and understanding.  Qualifies for Shingles Vaccine? Yes   Zostavax completed No   Shingrix Completed?: No.    Education has been provided regarding the importance of this vaccine. Patient has been advised to call insurance company to determine out of pocket expense if they have not yet received this vaccine. Advised may also receive vaccine at local pharmacy or Health Dept. Verbalized  acceptance and understanding.  Screening Tests Health Maintenance  Topic Date Due   Diabetic kidney evaluation - Urine ACR  Never done   Zoster Vaccines- Shingrix (1 of 2) Never done   Colonoscopy  12/12/2012   Pneumonia Vaccine 47+ Years old (2 of 2 - PCV) 10/29/2016   FOOT EXAM  10/15/2018   MAMMOGRAM  01/26/2020   HEMOGLOBIN A1C  05/16/2021   Diabetic kidney evaluation - eGFR measurement  11/13/2021   OPHTHALMOLOGY EXAM  12/30/2022   INFLUENZA VACCINE  03/25/2023   DTaP/Tdap/Td (2 - Td or Tdap) 02/04/2024   Medicare Annual Wellness (AWV)  03/09/2024   DEXA SCAN  Completed   Hepatitis C Screening  Completed   HPV VACCINES  Aged Out   COVID-19 Vaccine  Discontinued    Health Maintenance  Health Maintenance Due  Topic Date Due   Diabetic kidney evaluation - Urine ACR  Never done   Zoster Vaccines- Shingrix (1 of 2) Never done   Colonoscopy  12/12/2012   Pneumonia Vaccine 5+ Years old (2 of 2 - PCV) 10/29/2016   FOOT EXAM  10/15/2018   MAMMOGRAM  01/26/2020   HEMOGLOBIN A1C  05/16/2021   Diabetic kidney evaluation - eGFR measurement  11/13/2021   OPHTHALMOLOGY EXAM  12/30/2022    Colorectal cancer screening: Type of screening: Colonoscopy. Completed Patient unsure when she had the procedure. Repeat every 5 years  Patient declines  Mammogram status: Ordered 11/23/22 . Pt provided with contact info and advised to call to  schedule appt.   Bone Density status: Completed 5/21. Results reflect: Bone density results: NORMAL. Repeat every 2 years.  Lung Cancer Screening: (Low Dose CT Chest recommended if Age 50-80 years, 20 pack-year currently smoking OR have quit w/in 15years.) does not qualify.     Additional Screening:  Hepatitis C Screening: does qualify; Completed 11/19  Vision Screening: Recommended annual ophthalmology exams for early detection of glaucoma and other disorders of the eye. Is the patient up to date with their annual eye exam?  Yes  Who is the provider or what is the name of the office in which the patient attends annual eye exams? [facility]  If pt is not established with a provider, would they like to be referred to a provider to establish care? No .   Dental Screening: Recommended annual dental exams for proper oral hygiene  Diabetic Foot Exam: Diabetic Foot Exam: Completed 2/19 pospondc 6/23  Community Resource Referral / Chronic Care Management: CRR required this visit?  No   CCM required this visit?  No     Plan:     I have personally reviewed and noted the following in the patient's chart:   Medical and social history Use of alcohol, tobacco or illicit drugs  Current medications and supplements including opioid prescriptions. Patient is not currently taking opioid prescriptions. Functional ability and status Nutritional status Physical activity Advanced directives List of other physicians Hospitalizations, surgeries, and ER visits in previous 12 months Vitals Screenings to include cognitive, depression, and falls Referrals and appointments  In addition, I have reviewed and discussed with patient certain preventive protocols, quality metrics, and best practice recommendations. A written personalized care plan for preventive services as well as general preventive health recommendations were provided to patient.     Sydell Axon, LPN   02/11/3085   After Visit  Summary: (MyChart) Due to this being a telephonic visit, the after visit summary with patients personalized plan was offered to patient via MyChart  Nurse Notes: None

## 2023-03-10 NOTE — Patient Instructions (Signed)
Per patient no change in vitals since last visit, unable to obtain new vitals due to telehealth visit  Tammy Acosta , Thank you for taking time to come for your Medicare Wellness Visit. I appreciate your ongoing commitment to your health goals. Please review the following plan we discussed and let me know if I can assist you in the future.   These are the goals we discussed:  Goals       Medication assistance and diabetes information      Interventions Today    Flowsheet Row Most Recent Value  Chronic Disease   Chronic disease during today's visit Diabetes, Hypertension (HTN)  [Evaluation of current treatment plan related to diabetes and hypertension and patients adherence to plan as established by provider.]  General Interventions   General Interventions Discussed/Reviewed General Interventions Discussed, Labs, Doctor Visits, Vaccines, Annual Foot Exam  [Discussed need to have Hgb A1c and provided explanation of HgbA1c. Reviewed previous A1c results. Advised to discuss need for pneumonia and shingles vaccine with pcp. Discussed importance of having frequent foot exams by provider.]  Labs Hgb A1c every 6 months  Vaccines Pneumonia, Shingles  Doctor Visits Discussed/Reviewed Doctor Visits Discussed  [Advised patient to call primary care provider office and schedule annual physical exam.]  Education Interventions   Education Provided Provided Printed Education  [Education information on diabetes/ HgbA1c sent to patient in MyChart.]  Pharmacy Interventions   Pharmacy Dicussed/Reviewed Pharmacy Topics Reviewed, Referral to Pharmacist  Referral to Pharmacist Cannot afford medications  [unable to afford basalgar kwikpen]            Patient Stated      None      Weight (lb) < 150 lb (68 kg) (pt-stated)      Goal 145lb Stay as active as possible.        This is a list of the screening recommended for you and due dates:  Health Maintenance  Topic Date Due   Yearly kidney health  urinalysis for diabetes  Never done   Zoster (Shingles) Vaccine (1 of 2) Never done   Colon Cancer Screening  12/12/2012   Pneumonia Vaccine (2 of 2 - PCV) 10/29/2016   Complete foot exam   10/15/2018   Mammogram  01/26/2020   Hemoglobin A1C  05/16/2021   Yearly kidney function blood test for diabetes  11/13/2021   Eye exam for diabetics  12/30/2022   Flu Shot  03/25/2023   DTaP/Tdap/Td vaccine (2 - Td or Tdap) 02/04/2024   Medicare Annual Wellness Visit  03/09/2024   DEXA scan (bone density measurement)  Completed   Hepatitis C Screening  Completed   HPV Vaccine  Aged Out   COVID-19 Vaccine  Discontinued    Advanced directives:Will work on this  Conditions/risks identified: None  Next appointment: Follow up in one year for your annual wellness visit 03/16/24 @ 1:45   Preventive Care 65 Years and Older, Female Preventive care refers to lifestyle choices and visits with your health care provider that can promote health and wellness. What does preventive care include? A yearly physical exam. This is also called an annual well check. Dental exams once or twice a year. Routine eye exams. Ask your health care provider how often you should have your eyes checked. Personal lifestyle choices, including: Daily care of your teeth and gums. Regular physical activity. Eating a healthy diet. Avoiding tobacco and drug use. Limiting alcohol use. Practicing safe sex. Taking low-dose aspirin every day. Taking vitamin and mineral supplements  as recommended by your health care provider. What happens during an annual well check? The services and screenings done by your health care provider during your annual well check will depend on your age, overall health, lifestyle risk factors, and family history of disease. Counseling  Your health care provider may ask you questions about your: Alcohol use. Tobacco use. Drug use. Emotional well-being. Home and relationship well-being. Sexual  activity. Eating habits. History of falls. Memory and ability to understand (cognition). Work and work Astronomer. Reproductive health. Screening  You may have the following tests or measurements: Height, weight, and BMI. Blood pressure. Lipid and cholesterol levels. These may be checked every 5 years, or more frequently if you are over 64 years old. Skin check. Lung cancer screening. You may have this screening every year starting at age 1 if you have a 30-pack-year history of smoking and currently smoke or have quit within the past 15 years. Fecal occult blood test (FOBT) of the stool. You may have this test every year starting at age 85. Flexible sigmoidoscopy or colonoscopy. You may have a sigmoidoscopy every 5 years or a colonoscopy every 10 years starting at age 77. Hepatitis C blood test. Hepatitis B blood test. Sexually transmitted disease (STD) testing. Diabetes screening. This is done by checking your blood sugar (glucose) after you have not eaten for a while (fasting). You may have this done every 1-3 years. Bone density scan. This is done to screen for osteoporosis. You may have this done starting at age 49. Mammogram. This may be done every 1-2 years. Talk to your health care provider about how often you should have regular mammograms. Talk with your health care provider about your test results, treatment options, and if necessary, the need for more tests. Vaccines  Your health care provider may recommend certain vaccines, such as: Influenza vaccine. This is recommended every year. Tetanus, diphtheria, and acellular pertussis (Tdap, Td) vaccine. You may need a Td booster every 10 years. Zoster vaccine. You may need this after age 105. Pneumococcal 13-valent conjugate (PCV13) vaccine. One dose is recommended after age 47. Pneumococcal polysaccharide (PPSV23) vaccine. One dose is recommended after age 91. Talk to your health care provider about which screenings and vaccines  you need and how often you need them. This information is not intended to replace advice given to you by your health care provider. Make sure you discuss any questions you have with your health care provider. Document Released: 09/06/2015 Document Revised: 04/29/2016 Document Reviewed: 06/11/2015 Elsevier Interactive Patient Education  2017 [employer].  Fall Prevention in the Home Falls can cause injuries. They can happen to people of all ages. There are many things you can do to make your home safe and to help prevent falls. What can I do on the outside of my home? Regularly fix the edges of walkways and driveways and fix any cracks. Remove anything that might make you trip as you walk through a door, such as a raised step or threshold. Trim any bushes or trees on the path to your home. Use bright outdoor lighting. Clear any walking paths of anything that might make someone trip, such as rocks or tools. Regularly check to see if handrails are loose or broken. Make sure that both sides of any steps have handrails. Any raised decks and porches should have guardrails on the edges. Have any leaves, snow, or ice cleared regularly. Use sand or salt on walking paths during winter. Clean up any spills in your garage  right away. This includes oil or grease spills. What can I do in the bathroom? Use night lights. Install grab bars by the toilet and in the tub and shower. Do not use towel bars as grab bars. Use non-skid mats or decals in the tub or shower. If you need to sit down in the shower, use a plastic, non-slip stool. Keep the floor dry. Clean up any water that spills on the floor as soon as it happens. Remove soap buildup in the tub or shower regularly. Attach bath mats securely with double-sided non-slip rug tape. Do not have throw rugs and other things on the floor that can make you trip. What can I do in the bedroom? Use night lights. Make sure that you have a light by your bed that  is easy to reach. Do not use any sheets or blankets that are too big for your bed. They should not hang down onto the floor. Have a firm chair that has side arms. You can use this for support while you get dressed. Do not have throw rugs and other things on the floor that can make you trip. What can I do in the kitchen? Clean up any spills right away. Avoid walking on wet floors. Keep items that you use a lot in easy-to-reach places. If you need to reach something above you, use a strong step stool that has a grab bar. Keep electrical cords out of the way. Do not use floor polish or wax that makes floors slippery. If you must use wax, use non-skid floor wax. Do not have throw rugs and other things on the floor that can make you trip. What can I do with my stairs? Do not leave any items on the stairs. Make sure that there are handrails on both sides of the stairs and use them. Fix handrails that are broken or loose. Make sure that handrails are as long as the stairways. Check any carpeting to make sure that it is firmly attached to the stairs. Fix any carpet that is loose or worn. Avoid having throw rugs at the top or bottom of the stairs. If you do have throw rugs, attach them to the floor with carpet tape. Make sure that you have a light switch at the top of the stairs and the bottom of the stairs. If you do not have them, ask someone to add them for you. What else can I do to help prevent falls? Wear shoes that: Do not have high heels. Have rubber bottoms. Are comfortable and fit you well. Are closed at the toe. Do not wear sandals. If you use a stepladder: Make sure that it is fully opened. Do not climb a closed stepladder. Make sure that both sides of the stepladder are locked into place. Ask someone to hold it for you, if possible. Clearly mark and make sure that you can see: Any grab bars or handrails. First and last steps. Where the edge of each step is. Use tools that help you  move around (mobility aids) if they are needed. These include: Canes. Walkers. Scooters. Crutches. Turn on the lights when you go into a dark area. Replace any light bulbs as soon as they burn out. Set up your furniture so you have a clear path. Avoid moving your furniture around. If any of your floors are uneven, fix them. If there are any pets around you, be aware of where they are. Review your medicines with your doctor. Some medicines can make  you feel dizzy. This can increase your chance of falling. Ask your doctor what other things that you can do to help prevent falls. This information is not intended to replace advice given to you by your health care provider. Make sure you discuss any questions you have with your health care provider. Document Released: 06/06/2009 Document Revised: 01/16/2016 Document Reviewed: 09/14/2014 Elsevier Interactive Patient Education  2017 [employer].

## 2023-03-11 ENCOUNTER — Other Ambulatory Visit: Payer: Self-pay | Admitting: Family Medicine

## 2023-03-11 DIAGNOSIS — I10 Essential (primary) hypertension: Secondary | ICD-10-CM

## 2023-03-11 DIAGNOSIS — E785 Hyperlipidemia, unspecified: Secondary | ICD-10-CM

## 2023-03-24 ENCOUNTER — Encounter (INDEPENDENT_AMBULATORY_CARE_PROVIDER_SITE_OTHER): Payer: Self-pay

## 2023-04-07 ENCOUNTER — Ambulatory Visit: Payer: Medicare Other | Admitting: Family Medicine

## 2023-04-21 ENCOUNTER — Ambulatory Visit: Payer: Medicare Other | Admitting: Family Medicine

## 2023-04-23 ENCOUNTER — Ambulatory Visit: Payer: Medicare Other | Admitting: Family Medicine

## 2023-04-24 ENCOUNTER — Other Ambulatory Visit: Payer: Self-pay | Admitting: Family Medicine

## 2023-04-24 DIAGNOSIS — F419 Anxiety disorder, unspecified: Secondary | ICD-10-CM

## 2023-05-03 ENCOUNTER — Telehealth: Payer: Self-pay | Admitting: Pharmacist

## 2023-05-03 NOTE — Progress Notes (Signed)
Noted that patient has not seen PCP yet, scheduled appointment for 9/13. Will defer our appointment until after she sees Dr. Birdie Sons, as she has not been seen in over a year. Attempted to call patient to discuss, unable to leave voicemail. Will send MyChart.   Catie Eppie Gibson, PharmD, BCACP, CPP Clinical Pharmacist [facility] [phone]

## 2023-05-04 ENCOUNTER — Other Ambulatory Visit: Payer: Medicare Other | Admitting: Pharmacist

## 2023-05-07 ENCOUNTER — Encounter: Payer: Self-pay | Admitting: Family Medicine

## 2023-05-07 ENCOUNTER — Ambulatory Visit (INDEPENDENT_AMBULATORY_CARE_PROVIDER_SITE_OTHER): Payer: Medicare Other | Admitting: Family Medicine

## 2023-05-07 VITALS — BP 114/74 | HR 90 | Temp 98.9°F | Ht 62.0 in | Wt 119.6 lb

## 2023-05-07 DIAGNOSIS — R2689 Other abnormalities of gait and mobility: Secondary | ICD-10-CM | POA: Diagnosis not present

## 2023-05-07 DIAGNOSIS — R634 Abnormal weight loss: Secondary | ICD-10-CM | POA: Diagnosis not present

## 2023-05-07 DIAGNOSIS — I10 Essential (primary) hypertension: Secondary | ICD-10-CM | POA: Diagnosis not present

## 2023-05-07 DIAGNOSIS — F419 Anxiety disorder, unspecified: Secondary | ICD-10-CM

## 2023-05-07 DIAGNOSIS — Z794 Long term (current) use of insulin: Secondary | ICD-10-CM | POA: Diagnosis not present

## 2023-05-07 DIAGNOSIS — Z7984 Long term (current) use of oral hypoglycemic drugs: Secondary | ICD-10-CM | POA: Diagnosis not present

## 2023-05-07 DIAGNOSIS — E785 Hyperlipidemia, unspecified: Secondary | ICD-10-CM

## 2023-05-07 DIAGNOSIS — Z1231 Encounter for screening mammogram for malignant neoplasm of breast: Secondary | ICD-10-CM

## 2023-05-07 DIAGNOSIS — E1142 Type 2 diabetes mellitus with diabetic polyneuropathy: Secondary | ICD-10-CM

## 2023-05-07 DIAGNOSIS — F32A Depression, unspecified: Secondary | ICD-10-CM

## 2023-05-07 MED ORDER — SERTRALINE HCL 100 MG PO TABS
200.0000 mg | ORAL_TABLET | Freq: Every day | ORAL | 1 refills | Status: DC
Start: 2023-05-07 — End: 2023-09-08

## 2023-05-07 NOTE — Progress Notes (Signed)
Tammy Alar, MD Phone: [phone]  Tammy Acosta is a 67 y.o. female who presents today for follow-up.  Diabetes: Patient notes her sugars have been running high.  She has had a hard time remembering to take her insulin.  When she does take her Basaglar to 53 units daily.  She does take metformin.  No polyuria or polydipsia.  No hypoglycemia.  Hypertension: Patient is taking amlodipine and lisinopril.  No chest pain or shortness of breath.  Weight loss: Patient notes over the last few months she has lost significant amounts of weight with no specific cause.  She notes not having much of an appetite and only eats 1 time a day.  No vomiting or diarrhea.  No abdominal pain.  No night sweats.  No itching.  She does have some fatigue.  No depression.  Some anxiety.  She does take Zoloft 150 mg daily for anxiety and depression.  Neuropathy: Patient reports both of her feet are numb.  This has been a chronic issue.  Her balance has progressively gotten worse with this.  She is fallen a few times though not had any injury.  She does take gabapentin for the neuropathy.  Social History   Tobacco Use  Smoking Status Every Day   Current packs/day: 0.00   Types: Cigarettes  Smokeless Tobacco Never    Current Outpatient Medications on File Prior to Visit  Medication Sig Dispense Refill   amLODipine (NORVASC) 5 MG tablet TAKE 1 TABLET(5 MG) BY MOUTH DAILY 90 tablet 1   blood glucose meter kit and supplies Dispense One touch meter.  E11.9 1 each 0   Blood Glucose Monitoring Suppl (ONE TOUCH ULTRA 2) w/Device KIT Dispense 1 meter to use to test blood glucose once daily. Dx code: E11.42. 1 kit 0   Continuous Glucose Receiver (FREESTYLE LIBRE 3 READER) DEVI 1 each by Does not apply route daily. Use to check glucose continuously. 1 each 0   Continuous Glucose Sensor (FREESTYLE LIBRE 3 SENSOR) MISC Place 1 sensor on the skin every 14 days. Use to check glucose continuously 2 each 11   ezetimibe  (ZETIA) 10 MG tablet TAKE 1 TABLET(10 MG) BY MOUTH DAILY 90 tablet 3   gabapentin (NEURONTIN) 800 MG tablet TAKE 1 TABLET(800 MG) BY MOUTH FOUR TIMES DAILY 360 tablet 0   glucose blood (ONETOUCH ULTRA) test strip USE 1 STRIP TO CHECK GLUCOSE UP TO THREE TIMES DAILY AS DIRECTED. 100 each 1   Insulin Glargine (BASAGLAR KWIKPEN) 100 UNIT/ML Inject 54 Units into the skin daily. 15 mL 2   Insulin Pen Needle (PEN NEEDLES) 32G X 5 MM MISC 1 pen by Does not apply route daily. Use to inject insulin daily 100 each 4   Lancets (ONETOUCH DELICA PLUS LANCET33G) MISC USE   TO CHECK GLUCOSE UP TO THREE TIMES DAILY AS DIRECTED 100 each 1   lisinopril (ZESTRIL) 40 MG tablet TAKE 1 TABLET(40 MG) BY MOUTH DAILY 90 tablet 3   metFORMIN (GLUCOPHAGE) 1000 MG tablet TAKE 1 TABLET(1000 MG) BY MOUTH TWICE DAILY WITH A MEAL 180 tablet 0   Oxcarbazepine (TRILEPTAL) 300 MG tablet TAKE 1 TABLET BY MOUTH TWICE DAILY 180 tablet 1   rosuvastatin (CRESTOR) 40 MG tablet TAKE 1 TABLET(40 MG) BY MOUTH DAILY 90 tablet 3   tiZANidine (ZANAFLEX) 4 MG tablet TAKE 1 TABLET BY MOUTH EVERY 8 HOURS AS NEEDED FOR MUSCLE SPASM 30 tablet 0   No current facility-administered medications on file prior to visit.  ROS see history of present illness  Objective  Physical Exam Vitals:   05/07/23 1341  BP: 114/74  Pulse: 90  Temp: 98.9 F (37.2 C)  SpO2: 97%    BP Readings from Last 3 Encounters:  05/07/23 114/74  12/26/21 130/70  11/13/20 (!) 145/80   Wt Readings from Last 3 Encounters:  05/07/23 119 lb 9.6 oz (54.3 kg)  03/10/23 158 lb (71.7 kg)  05/29/22 163 lb (73.9 kg)    Physical Exam Constitutional:      General: She is not in acute distress.    Appearance: She is not diaphoretic.  Cardiovascular:     Rate and Rhythm: Normal rate and regular rhythm.     Heart sounds: Normal heart sounds.  Pulmonary:     Effort: Pulmonary effort is normal.     Breath sounds: Normal breath sounds.  Abdominal:     General: Bowel  sounds are normal. There is no distension.     Palpations: Abdomen is soft.     Tenderness: There is no abdominal tenderness.  Skin:    General: Skin is warm and dry.  Neurological:     Mental Status: She is alert.     Comments: CN 3-12 intact, 5/5 strength in bilateral biceps, triceps, grip, quads, hamstrings, plantar and dorsiflexion, sensation to light touch reduced in bilateral lower extremities distal to the knee, intact in bilateral UE       Assessment/Plan: Please see individual problem list.  Unexplained weight loss Assessment & Plan: Patient is down 39 pounds in less than 2 months.  Discussed this is significant weight loss and is very concerning.  Discussed this could be due to numerous things.  We will check lab work as outlined.  CT chest abdomen and pelvis has been ordered to evaluate further.  Discussed cancer screening with mammogram, Pap smear, and colonoscopy.  Patient agrees to the mammogram though defers Pap smear and colonoscopy at this time until we have completed the workup listed above.  She will follow-up with me in 1 month.  Orders: -     Comprehensive metabolic panel -     TSH -     CBC with Differential/Platelet -     Sedimentation rate -     CT CHEST WO CONTRAST; Future -     CT ABDOMEN PELVIS W CONTRAST; Future -     POCT urinalysis dipstick; Future  Balance problem Assessment & Plan: Chronic issue.  Suspect related to her neuropathy.  Will refer for physical therapy for her balance.  Orders: -     Ambulatory referral to Physical Therapy  Encounter for screening mammogram for malignant neoplasm of breast -     3D Screening Mammogram, Left and Right; Future  Essential (primary) hypertension Assessment & Plan: Chronic issue.  Adequately controlled.  Patient will continue amlodipine 5 mg daily and lisinopril 40 mg daily.   Type 2 diabetes mellitus with diabetic polyneuropathy, without long-term current use of insulin (HCC) Assessment &  Plan: Chronic issue.  Undetermined control.  Patient will continue Basaglar 53 units daily and metformin 1000 mg twice daily.  Checking labs today.  Orders: -     Hemoglobin A1c -     Microalbumin / creatinine urine ratio; Future  Diabetic peripheral neuropathy (HCC) Assessment & Plan: Chronic issue.  Patient will remain on gabapentin 800 mg 4 times daily.   Anxiety and depression -     Sertraline HCl; Take 2 tablets (200 mg total) by mouth daily.  Dispense: 180 tablet; Refill: 1  Hyperlipidemia, unspecified hyperlipidemia type -     Comprehensive metabolic panel -     Lipid panel     Health Maintenance: Patient will call to schedule her mammogram.  Return in about 1 month (around 06/06/2023) for Weight follow-up.   Tammy Alar, MD [facility]

## 2023-05-07 NOTE — Assessment & Plan Note (Signed)
Patient is down 39 pounds in less than 2 months.  Discussed this is significant weight loss and is very concerning.  Discussed this could be due to numerous things.  We will check lab work as outlined.  CT chest abdomen and pelvis has been ordered to evaluate further.  Discussed cancer screening with mammogram, Pap smear, and colonoscopy.  Patient agrees to the mammogram though defers Pap smear and colonoscopy at this time until we have completed the workup listed above.  She will follow-up with me in 1 month.

## 2023-05-07 NOTE — Patient Instructions (Signed)
Nice to see you. We are going to get CT imaging of your chest abdomen and pelvis.  Someone will call you to schedule this. Please call [phone] to schedule your mammogram. PT should call you to set up an evaluation. Will get lab work today and contact you with results. We went up on your Zoloft to 200 mg daily.

## 2023-05-07 NOTE — Assessment & Plan Note (Signed)
Chronic issue.  Patient will remain on gabapentin 800 mg 4 times daily.

## 2023-05-07 NOTE — Assessment & Plan Note (Signed)
Chronic issue.  Suspect related to her neuropathy.  Will refer for physical therapy for her balance.

## 2023-05-07 NOTE — Assessment & Plan Note (Signed)
Chronic issue.  Undetermined control.  Patient will continue Basaglar 53 units daily and metformin 1000 mg twice daily.  Checking labs today.

## 2023-05-07 NOTE — Assessment & Plan Note (Signed)
Chronic issue.  Adequately controlled.  Patient will continue amlodipine 5 mg daily and lisinopril 40 mg daily.

## 2023-05-08 ENCOUNTER — Telehealth: Payer: Self-pay | Admitting: Family Medicine

## 2023-05-08 DIAGNOSIS — R7989 Other specified abnormal findings of blood chemistry: Secondary | ICD-10-CM

## 2023-05-08 DIAGNOSIS — E1142 Type 2 diabetes mellitus with diabetic polyneuropathy: Secondary | ICD-10-CM

## 2023-05-08 NOTE — Telephone Encounter (Signed)
Called to check on the patient. She noted her glucose was right at 400 after taking her insulin and has been there all day. She still has no symptoms. She takes basaglar 53 u daily. I advised her to increase this to 58 u daily tomorrow. Discussed getting evaluated in the ED if her sugar goes up any and certainly if it gets to 500 again or if she feels poorly, has nausea, vomiting, or any new symptoms. Discussed her mildly elevated potassium. She is on lisinopril. Discussed we would recheck that Monday and may need to alter her BP medication regimen. Also discussed her mildly elevated LFTs. She notes no abdominal pain. We will recheck that on Monday. Discussed if her glucose drops too much on the basaglar 58 u tomorrow she should eat something and let us know.

## 2023-05-08 NOTE — Telephone Encounter (Signed)
Called and spoke with patient regarding glucose level. She noted someone called her around 3 in the morning and advised her to go to the hospital for evaluation though she declined. She checked her glucose while we were on the phone and it was 525. I advised that she go to the ED for evaluation and she declined. She wanted to try taking her morning dose of basaglar and see if that helped. She typically takes 53 units daily. She last took this yesterday morning. I advised I felt she needed ED evaluation for this and did not think that her insulin was going to provide adequate treatment given how high her glucose was yesterday afternoon after taking her insulin in the morning. She still did not want to go to the ED. I discussed if she preferred to take the insulin and see how her glucose did then she should take her typical dose and check her sugar 30 minutes, one hour, and a couple of hours after taking her insulin and also check later in the day. If her glucose does not come down lower than 400 or if she develops any nausea, vomiting, or other symptoms she is to go to the ED and she agreed with this. Discussed the risk of developing DKA or severe illness without appropriate intervention.  She notes she feels fine at this time.

## 2023-05-10 NOTE — Telephone Encounter (Signed)
Please call the patient.  She needs lab work done today if possible.  Orders have been placed.  Please also see what her glucose has been doing over the last day or so.  Thanks.

## 2023-05-10 NOTE — Addendum Note (Signed)
Addended by: Glori Luis on: 05/10/2023 08:49 AM   Modules accepted: Orders

## 2023-05-10 NOTE — Telephone Encounter (Signed)
Left message to call the office back.

## 2023-05-13 LAB — COMPREHENSIVE METABOLIC PANEL
AG Ratio: 1.4 (calc) (ref 1.0–2.5)
ALT: 84 U/L — ABNORMAL HIGH (ref 6–29)
AST: 52 U/L — ABNORMAL HIGH (ref 10–35)
Albumin: 3.4 g/dL — ABNORMAL LOW (ref 3.6–5.1)
Alkaline phosphatase (APISO): 96 U/L (ref 37–153)
BUN: 17 mg/dL (ref 7–25)
CO2: 19 mmol/L — ABNORMAL LOW (ref 20–32)
Calcium: 8.5 mg/dL — ABNORMAL LOW (ref 8.6–10.4)
Chloride: 92 mmol/L — ABNORMAL LOW (ref 98–110)
Creat: 0.97 mg/dL (ref 0.50–1.05)
Globulin: 2.5 g/dL (calc) (ref 1.9–3.7)
Glucose, Bld: 823 mg/dL (ref 65–99)
Potassium: 5.4 mmol/L — ABNORMAL HIGH (ref 3.5–5.3)
Sodium: 126 mmol/L — ABNORMAL LOW (ref 135–146)
Total Bilirubin: 0.2 mg/dL (ref 0.2–1.2)
Total Protein: 5.9 g/dL — ABNORMAL LOW (ref 6.1–8.1)

## 2023-05-13 LAB — LIPID PANEL
Cholesterol: 71 mg/dL (ref <200)
HDL: 23 mg/dL — ABNORMAL LOW (ref >=50)
LDL Cholesterol (Calc): 16 mg/dL
Non-HDL Cholesterol (Calc): 48 mg/dL (calc) (ref <130)
Total CHOL/HDL Ratio: 3.1 (calc) (ref <5.0)
Triglycerides: 307 mg/dL — ABNORMAL HIGH (ref <150)

## 2023-05-13 LAB — CBC WITH DIFFERENTIAL/PLATELET
Absolute Monocytes: 418 cells/uL (ref 200–950)
Basophils Absolute: 51 cells/uL (ref 0–200)
Basophils Relative: 0.5 %
Eosinophils Absolute: 71 cells/uL (ref 15–500)
Eosinophils Relative: 0.7 %
HCT: 43.9 % (ref 35.0–45.0)
Hemoglobin: 13.7 g/dL (ref 11.7–15.5)
Lymphs Abs: 2417 cells/uL (ref 850–3900)
MCH: 30.2 pg (ref 27.0–33.0)
MCHC: 31.2 g/dL — ABNORMAL LOW (ref 32.0–36.0)
MCV: 96.7 fL (ref 80.0–100.0)
MPV: 13 fL — ABNORMAL HIGH (ref 7.5–12.5)
Monocytes Relative: 4.1 %
Neutro Abs: 7242 cells/uL (ref 1500–7800)
Neutrophils Relative %: 71 %
Platelets: 280 10*3/uL (ref 140–400)
RBC: 4.54 10*6/uL (ref 3.80–5.10)
RDW: 12 % (ref 11.0–15.0)
Total Lymphocyte: 23.7 %
WBC: 10.2 10*3/uL (ref 3.8–10.8)

## 2023-05-13 LAB — TSH: TSH: 2.31 mIU/L (ref 0.40–4.50)

## 2023-05-13 LAB — HEMOGLOBIN A1C

## 2023-05-13 LAB — SEDIMENTATION RATE: Sed Rate: 41 mm/h — ABNORMAL HIGH (ref 0–30)

## 2023-05-13 NOTE — Telephone Encounter (Signed)
Noted. Has she consistently been taking her basaglar 58 u daily?

## 2023-05-13 NOTE — Telephone Encounter (Signed)
Patient is scheduled for labs on 05/14/23 and her glucose has been between 410 & 430 for the past few days. The Patient states she can not get it down to 400 or below.

## 2023-05-13 NOTE — Telephone Encounter (Signed)
LMTCB

## 2023-05-14 ENCOUNTER — Other Ambulatory Visit: Payer: Medicare Other

## 2023-05-14 NOTE — Addendum Note (Signed)
Addended by: Glori Luis on: 05/14/2023 10:21 AM   Modules accepted: Orders

## 2023-05-14 NOTE — Telephone Encounter (Signed)
LMTCB

## 2023-05-18 ENCOUNTER — Ambulatory Visit: Admission: RE | Admit: 2023-05-18 | Payer: Medicare Other | Source: Ambulatory Visit

## 2023-05-18 ENCOUNTER — Ambulatory Visit: Payer: Medicare Other

## 2023-05-20 NOTE — Telephone Encounter (Signed)
LVM FOR PT TO GIVE OFFICE A CALL BACK ALSO MYCHART MSG SENT

## 2023-05-21 ENCOUNTER — Other Ambulatory Visit: Payer: Medicare Other

## 2023-05-24 ENCOUNTER — Other Ambulatory Visit: Payer: Medicare Other

## 2023-05-24 ENCOUNTER — Other Ambulatory Visit: Payer: Medicare Other | Admitting: Pharmacist

## 2023-05-24 NOTE — Progress Notes (Deleted)
05/24/2023 Name: Tammy Acosta MRN: [id number] DOB: 10-Oct-1955  Subjective  No chief complaint on file.   Reason for visit: Tammy Acosta is a 67 y.o. year old female who presented for a telephone visit.   They were referred to the pharmacist by their PCP for assistance in managing diabetes.   Care Team: Primary Care Provider: Glori Luis, MD  Reason for visit: ?  Tammy Acosta is a 67 y.o. female with a history of diabetes (type 2), who presents today for a diabetes pharmacotherapy visit.? Pertinent PMH also includes ***.   Known DM Complications: peripheral neuropathy    Date of Last Diabetes Related Visit: 9/13 with PCP; 5/6 with Pharmacist    At Last Diabetes Related Visit: ?  Significant weight loss (39 pounds in less than 2 months) with no specific cause. Notes not having much of an appetite and only eats 1 time a day. No vomiting or diarrhea. No abdominal pain. ?    Since Last visit / History of Present Illness: ?  Patient reports implementing*** plan from last visit. Denies adverse effects with titration of ***.   Prescription drug coverage: Payor: Advertising copywriter MEDICARE / Plan: [facility] MEDICARE / Product Type: *No Product type* / .  Reports that all medications are *** affordable.  Current Patient Assistance: None***  Adherence: Patient does note forgetting to take her insulin ***   Reported DM Regimen: ?  Metformin 1000 mg twice daily Basaglar 53*** units once daily   DM medications tried in the past:?  *** (***) *** (***) *** (***)  Reported HTN Regimen: Amlodipine 5 mg daily Lisinopril 40 mg daily  Reported Diet: Patient typically eats 1 meal per day due to significant reduction in appetite.  Typical eating pattern: ***  SMBG {Blank single:19197::"Per BG meter","Per patient provided BG log","Per patient memory","***"}: ?  ***   Hypo/Hyperglycemia: ?  Symptoms of hypoglycemia since last visit:? {Blank  multiple:19197::"yes","no","n/a","***"}  If yes, it was treated by: {Blank multiple:19197::"***","n/a"}  Symptoms of hyperglycemia since last visit:? {Blank multiple:19197::"yes","no","n/a","***"} - {symptoms; hyperglycemia:17903}  DM Prevention:  Statin: {Blank single:19197::"***","Taking","Not taking","Intolerant to","Declines"}; {Blank single:19197::"low intensity","moderate intensity","high intensity","n/a"}.?  History of chronic kidney disease? {Blank single:19197::"yes","no"} History of albuminuria? {Blank single:19197::"yes","no"}, last UACR on *** = *** mg/g No results found for: "LABMICR", "MICROALBUR" ACE/ARB - {Blank single:19197::"***","Taking","Not taking","Intolerant to","Declines"} ***; Urine MA/CR Ratio - {Blank single:19197::"normal","elevated urinary albumin excretion","n/a","***"}.  Last eye exam: *** Last foot exam: No foot exam found Tobacco Use:  Tobacco Use: High Risk (05/07/2023)   Patient History    Smoking Tobacco Use: Every Day    Smokeless Tobacco Use: Never    Passive Exposure: Not on file   Immunizations:? Flu: {LZDUEUPTODATE:31033}, Pneumococcal: {LZVAXpneumococcal:31032:::0} - {LZVAXpneumo RISKAge19-64:31093}, Shingrix: {LZDUEUPTODATE:31033}, Covid (***)  Cardiovascular Risk Reduction History of clinical ASCVD? {Blank single:19197::"yes","no"} The ASCVD Risk score (Arnett DK, et al., 2019) failed to calculate for the following reasons:   The valid total cholesterol range is 130 to 320 mg/dL History of heart failure? {Blank single:19197::"yes","no"} {HF MEDS CURRENT:210917263} History of hyperlipidemia? {Blank single:19197::"yes","no"} Current BMI: *** kg/m2 (Ht *** in, Wt *** kg) Taking statin? {Blank single:19197::"yes","no","intolerant","declines"}; {Blank single:19197::"low intensity","moderate intensity","high intensity","n/a"} (***) Taking aspirin? {Blank single:19197::"indicated (primary prevention)","indicated (secondary prevention)","not  indicated","unclear if indicated"}; {Blank single:19197::"***","Taking","Not taking","Intolerant to","Declines"}   Taking SGLT-2i? {Blank single:19197::"yes","no"} Taking GLP- 1 RA? {Blank single:19197::"yes","no"}      _______________________________________________  Objective    Review of Systems:?  Constitutional:? No fever, chills or unintentional weight loss  Cardiovascular:? No chest pain  or pressure, shortness of breath, dyspnea on exertion, orthopnea or LE edema  Pulmonary:? No cough or shortness of breath  GI:? No nausea, vomiting, constipation, diarrhea, abdominal pain, dyspepsia, change in bowel habits  Endocrine:? No polyuria, polyphagia or blurred vision  Psych:? No depression, anxiety, insomnia    Physical Examination:  Vitals:  Wt Readings from Last 3 Encounters:  05/07/23 119 lb 9.6 oz (54.3 kg)  03/10/23 158 lb (71.7 kg)  05/29/22 163 lb (73.9 kg)   BP Readings from Last 3 Encounters:  05/07/23 114/74  12/26/21 130/70  11/13/20 (!) 145/80   Pulse Readings from Last 3 Encounters:  05/07/23 90  12/26/21 86  11/13/20 85     Labs:?  Lab Results  Component Value Date   HGBA1C CANCELED 05/07/2023   HGBA1C 6.8 (H) 11/13/2020   HGBA1C 7.4 (H) 11/29/2019   GLUCOSE 823 (HH) 05/07/2023   CREATININE 0.97 05/07/2023   CREATININE 0.85 11/13/2020   CREATININE 0.99 11/29/2019   GFR 71.76 11/13/2020   GFR 56.34 (L) 11/29/2019   GFR 46.61 (L) 11/14/2018    Lab Results  Component Value Date   CHOL 71 05/07/2023   LDLCALC 16 05/07/2023   LDLCALC 37 01/29/2020   LDLDIRECT 149.0 11/13/2020   HDL 23 (L) 05/07/2023   HDL 43.50 11/13/2020   HDL 27.70 (L) 01/29/2020   AST 52 (H) 05/07/2023   AST 12 11/13/2020   ALT 84 (H) 05/07/2023   ALT 9 11/13/2020      Chemistry      Component Value Date/Time   NA 126 (L) 05/07/2023 1400   NA 139 01/09/2013 1641   K 5.4 (H) 05/07/2023 1400   K 4.1 01/09/2013 1641   CL 92 (L) 05/07/2023 1400   CL 103 01/09/2013 1641    CO2 19 (L) 05/07/2023 1400   CO2 31 01/09/2013 1641   BUN 17 05/07/2023 1400   BUN 14 01/09/2013 1641   CREATININE 0.97 05/07/2023 1400      Component Value Date/Time   CALCIUM 8.5 (L) 05/07/2023 1400   CALCIUM 9.5 01/09/2013 1641   ALKPHOS 80 11/13/2020 0935   ALKPHOS 77 11/18/2012 0844   AST 52 (H) 05/07/2023 1400   AST 21 11/18/2012 0844   ALT 84 (H) 05/07/2023 1400   ALT 20 11/18/2012 0844   BILITOT 0.2 05/07/2023 1400   BILITOT 0.4 11/18/2012 0844       The ASCVD Risk score (Arnett DK, et al., 2019) failed to calculate for the following reasons:   The valid total cholesterol range is 130 to 320 mg/dL  Assessment and Plan:   1. Diabetes, type 2: Significantly uncontrolled per last A1c unable to calculate with BG of 823 mg/dL (1/61/09). Goal <7% without hypoglycemia though prioritize source of unknown weight loss at this time, reasonable A1c goal 7.5-8% in the meantime. {lzcgmsmbg:31042} data shows ***?. Current Regimen: Bsaglar 53*** units daily, metformin 1000 mg BID  Diet: Poor nutrition due to loss of appetite (significant ongoing weight loss) Exercise: ***  *** Continue medications today without changes.  Reviewed signs/symptoms/treatment of hypoglycemia  Next A1c due *** Future Consideration: GLP1-RA: Avoid in the setting of significant weight loss and loss of appetite as baseline  SGLT2i: Avoid in the setting of significant hyperglycemia (d/t inc risk volume depletion/adverse effects) SU: Not unreasonable, though caution with agents that may increase risk of hypoglycemia esp in the setting of recurrent falls which pt attributes to neuropathies.  TZD: *** Insulin: Prandial insulin may be needed at  least in the short term to acutely control significant hyperglycemia though hesitant given report of often forgetting basal insulin.      2. HTN {well-controlled:25230} based on last clinic BP of *** mmHg, goal <130/80 mmHg. Does not *** monitor BP at home.  Current  Regimen: *** Continue medications without changes.  BP Readings from Last 3 Encounters:  05/07/23 114/74  12/26/21 130/70  11/13/20 (!) 145/80     3. ASCVD (*** prevention): Controlled per lipid panel on *** with LDL of *** mg/dL indicates patient is *** risk of ASCVD.  Key risk factors include: {{cvs risk:31001:s}} Current Regimen: *** mg daily Continue medications today without changes.  Future Consideration: ***  4. Healthcare Maintenance:  Pneumococcal - Current status: ***  Shingles - Current status: *** Influenza - Current status: ***  Due to receive the following vaccines: {LZAPIMMUNIZATION:31028}   Follow Up Follow up with clinical pharmacist via *** in *** weeks.  ?  Follow up with Glori Luis, MD ***?  Future Appointments  Date Time Provider Department Center  05/24/2023  2:00 PM [facility] PHARMACIST [facility]  05/26/2023  3:00 PM [facility]  05/28/2023  2:00 PM [facility] LAB [facility]  03/13/2024  2:15 PM [facility] ANNUAL WELLNESS VISIT [facility]     Loree Fee, PharmD Clinical Pharmacist [facility] [phone]

## 2023-05-26 ENCOUNTER — Other Ambulatory Visit: Payer: Self-pay | Admitting: Family Medicine

## 2023-05-26 ENCOUNTER — Ambulatory Visit: Admission: RE | Admit: 2023-05-26 | Payer: Medicare Other | Source: Ambulatory Visit

## 2023-05-26 DIAGNOSIS — E1142 Type 2 diabetes mellitus with diabetic polyneuropathy: Secondary | ICD-10-CM

## 2023-05-27 NOTE — Telephone Encounter (Signed)
Patient confirmed she takes the Basaglar 58 units daily and her glucose is 129.

## 2023-05-28 ENCOUNTER — Other Ambulatory Visit: Payer: Medicare Other

## 2023-05-28 NOTE — Addendum Note (Signed)
Addended by: Birdie Sons, Harjit Leider G on: 05/28/2023 11:02 AM   Modules accepted: Orders

## 2023-05-28 NOTE — Telephone Encounter (Signed)
Noted. Patient has a repeat CMET scheduled for today. We will see what her glucose is on that.

## 2023-05-31 ENCOUNTER — Other Ambulatory Visit: Payer: Self-pay | Admitting: Pharmacist

## 2023-05-31 ENCOUNTER — Encounter: Payer: Self-pay | Admitting: Pharmacist

## 2023-05-31 ENCOUNTER — Other Ambulatory Visit: Payer: Medicare Other

## 2023-05-31 NOTE — Progress Notes (Signed)
Attempted to contact patient for scheduled appointment for medication management. Left HIPAA compliant message for patient to return my call at their convenience.   Loree Fee, PharmD Clinical Pharmacist [facility] [phone]

## 2023-05-31 NOTE — Progress Notes (Deleted)
05/31/2023 Name: Tammy Acosta MRN: [id number] DOB: 06-Jun-1956  Subjective  No chief complaint on file.   Reason for visit: Tammy Acosta is a 67 y.o. year old female who presented for a telephone visit.   They were referred to the pharmacist by their PCP for assistance in managing diabetes.   Care Team: Primary Care Provider: Glori Luis, MD  Reason for visit: ?  Tammy Acosta is a 67 y.o. female with a history of diabetes (type 2), who presents today for a diabetes pharmacotherapy visit.? Pertinent PMH also includes HTN, aortic atherosclerosis, neuropathy, OA, HLD, current smoker.   Known DM Complications: peripheral neuropathy    Date of Last Diabetes Related Visit: 9/13 with PCP; 5/6 with Pharmacist    At Last Diabetes Related Visit: ?  Significant weight loss (39 pounds in less than 2 months) with no specific cause. Notes not having much of an appetite and only eats 1 time a day. No vomiting or diarrhea. No abdominal pain. ?    Since Last visit / History of Present Illness: ?  Patient notes her sugars continue to run high. She has had a hard time remembering to take her insulin.  ***  Prescription drug coverage: Payor: Advertising copywriter MEDICARE / Plan: [facility] MEDICARE / Product Type: *No Product type* / .  Reports that all medications are *** affordable.  Current Patient Assistance: None***  Adherence: Patient does note forgetting to take her insulin *** times per week on average.    Reported DM Regimen: ?  Metformin 1000 mg twice daily Basaglar 53*** units once daily   DM medications tried in the past:?  *** (***) *** (***) *** (***)  Reported HTN Regimen: Amlodipine 5 mg daily Lisinopril 40 mg daily  Reported Diet: Patient typically eats 1 meal per day due to significant reduction in appetite.   SMBG {Blank single:19197::"Per BG meter","Per patient provided BG log","Per patient memory","***"}: ?  ***   Hypo/Hyperglycemia: ?  Symptoms of  hypoglycemia since last visit:? no  If yes, it was treated by: n/a  Symptoms of hyperglycemia since last visit:? {Blank multiple:19197::"yes","no","n/a","***"} - {symptoms; hyperglycemia:17903}  DM Prevention:  Statin: Taking; high intensity.?  History of chronic kidney disease? no History of albuminuria?  No history UACR , last UACR on *** = *** mg/g No results found for: "LABMICR", "MICROALBUR" ACE/ARB - Taking lisinopril 40 mg; Urine MA/CR Ratio - {Blank single:19197::"normal","elevated urinary albumin excretion","n/a","***"}.  Last eye exam: *** Last foot exam: No foot exam found Tobacco Use: Current every day smoker  Cardiovascular Risk Reduction History of clinical ASCVD?  Carotid atherosclerosis noted on imaging, unclear hx chest pain The ASCVD Risk score (Arnett DK, et al., 2019) failed to calculate for the following reasons:   The valid total cholesterol range is 130 to 320 mg/dL History of heart failure? no None History of hyperlipidemia? yes Current BMI: *** kg/m2 (Ht *** in, Wt 54.3 kg) Taking statin? yes; high intensity (rosuvastatin 40 mg) Taking aspirin? unclear if indicated; Not taking   Taking SGLT-2i? no Taking GLP- 1 RA? no      _______________________________________________  Objective    Review of Systems:?  Constitutional:? No fever, chills or unintentional weight loss  Cardiovascular:? No chest pain or pressure, shortness of breath, dyspnea on exertion, orthopnea or LE edema  Pulmonary:? No cough or shortness of breath  GI:? No nausea, vomiting, constipation, diarrhea, abdominal pain, dyspepsia, change in bowel habits  Endocrine:? No polyuria, polyphagia or blurred vision  Psych:?  No depression, anxiety, insomnia    Physical Examination:  Vitals:  Wt Readings from Last 3 Encounters:  05/07/23 119 lb 9.6 oz (54.3 kg)  03/10/23 158 lb (71.7 kg)  05/29/22 163 lb (73.9 kg)   BP Readings from Last 3 Encounters:  05/07/23 114/74  12/26/21 130/70   11/13/20 (!) 145/80   Pulse Readings from Last 3 Encounters:  05/07/23 90  12/26/21 86  11/13/20 85     Labs:?  Lab Results  Component Value Date   HGBA1C CANCELED 05/07/2023   HGBA1C 6.8 (H) 11/13/2020   HGBA1C 7.4 (H) 11/29/2019   GLUCOSE 823 (HH) 05/07/2023   CREATININE 0.97 05/07/2023   CREATININE 0.85 11/13/2020   CREATININE 0.99 11/29/2019   GFR 71.76 11/13/2020   GFR 56.34 (L) 11/29/2019   GFR 46.61 (L) 11/14/2018    Lab Results  Component Value Date   CHOL 71 05/07/2023   LDLCALC 16 05/07/2023   LDLCALC 37 01/29/2020   LDLDIRECT 149.0 11/13/2020   HDL 23 (L) 05/07/2023   HDL 43.50 11/13/2020   HDL 27.70 (L) 01/29/2020   AST 52 (H) 05/07/2023   AST 12 11/13/2020   ALT 84 (H) 05/07/2023   ALT 9 11/13/2020      Chemistry      Component Value Date/Time   NA 126 (L) 05/07/2023 1400   NA 139 01/09/2013 1641   K 5.4 (H) 05/07/2023 1400   K 4.1 01/09/2013 1641   CL 92 (L) 05/07/2023 1400   CL 103 01/09/2013 1641   CO2 19 (L) 05/07/2023 1400   CO2 31 01/09/2013 1641   BUN 17 05/07/2023 1400   BUN 14 01/09/2013 1641   CREATININE 0.97 05/07/2023 1400      Component Value Date/Time   CALCIUM 8.5 (L) 05/07/2023 1400   CALCIUM 9.5 01/09/2013 1641   ALKPHOS 80 11/13/2020 0935   ALKPHOS 77 11/18/2012 0844   AST 52 (H) 05/07/2023 1400   AST 21 11/18/2012 0844   ALT 84 (H) 05/07/2023 1400   ALT 20 11/18/2012 0844   BILITOT 0.2 05/07/2023 1400   BILITOT 0.4 11/18/2012 0844       The ASCVD Risk score (Arnett DK, et al., 2019) failed to calculate for the following reasons:   The valid total cholesterol range is 130 to 320 mg/dL  Assessment and Plan:   1. Diabetes, type 2: Significantly uncontrolled per last A1c unable to calculate with BG of 823 mg/dL (04/22/55). Goal <7% without hypoglycemia though prioritize source of unknown weight loss at this time, reasonable A1c goal 7.5-8% in the meantime. {lzcgmsmbg:31042} data shows ***?. Current Regimen: Bsaglar  53*** units daily, metformin 1000 mg BID  Diet: Poor nutrition due to loss of appetite (significant ongoing weight loss) Exercise: ***  *** Continue medications today without changes.  Reviewed signs/symptoms/treatment of hypoglycemia  Next A1c due *** Future Consideration: GLP1-RA: Avoid in the setting of significant weight loss and loss of appetite as baseline  SGLT2i: Avoid in the setting of significant hyperglycemia (d/t inc risk volume depletion/adverse effects) SU: Not unreasonable, though caution with agents that may increase risk of hypoglycemia esp in the setting of recurrent falls which pt attributes to neuropathies.  TZD: Not ideal given only moderate A1c-reduction potential, inc risk fracture Insulin: Prandial insulin likely needed at least in the short term to acutely control significant hyperglycemia though hesitant given report of often forgetting basal insulin.  Current basal significantly > 0.5 unit/kg. Continue to evaluate potential causes of hyperglycemia/significant weight loss  w PCP.    2. HTN: Controlled based on last clinic BP of 114/74 mmHg, goal <130/80 mmHg. Does not *** monitor BP at home.  Current Regimen: amlodipine 5 mg daily, lisinopril 40 mg daily  Continue medications without changes.    3. ASCVD (*** prevention): Controlled per lipid panel on *** with LDL of *** mg/dL indicates patient is *** risk of ASCVD.  Key risk factors include: {{cvs risk:31001:s}} Current Regimen: ezetimibe 10 mg daily, rosuvastatin 40 mg daily Continue medications today without changes.  Future Consideration: ***  4. Healthcare Maintenance:  Pneumococcal - Current status: ***  Shingles - Current status: *** Influenza - Current status: ***  Due to receive the following vaccines: {LZAPIMMUNIZATION:31028}   Follow Up Follow up with clinical pharmacist via *** in *** weeks.  ?  Follow up with Glori Luis, MD ***?  Future Appointments  Date Time Provider Department  Center  05/31/2023 11:00 AM [facility] PHARMACIST [facility]  06/02/2023  3:00 PM [facility]  06/04/2023  3:30 PM [facility] LAB [facility]  03/13/2024  2:15 PM [facility] ANNUAL WELLNESS VISIT [facility]     Loree Fee, PharmD Clinical Pharmacist [facility] [phone]

## 2023-06-02 ENCOUNTER — Ambulatory Visit: Admission: RE | Admit: 2023-06-02 | Payer: Medicare Other | Source: Ambulatory Visit

## 2023-06-03 NOTE — Addendum Note (Signed)
Addended by: Warden Fillers on: 06/03/2023 03:55 PM   Modules accepted: Orders

## 2023-06-04 ENCOUNTER — Other Ambulatory Visit: Payer: Medicare Other

## 2023-06-14 DIAGNOSIS — S42211A Unspecified displaced fracture of surgical neck of right humerus, initial encounter for closed fracture: Secondary | ICD-10-CM | POA: Diagnosis not present

## 2023-06-15 ENCOUNTER — Other Ambulatory Visit: Payer: Self-pay | Admitting: Family Medicine

## 2023-06-15 DIAGNOSIS — I10 Essential (primary) hypertension: Secondary | ICD-10-CM

## 2023-06-23 ENCOUNTER — Ambulatory Visit: Payer: Medicare Other | Admitting: Family Medicine

## 2023-06-23 ENCOUNTER — Telehealth: Payer: Self-pay | Admitting: Family Medicine

## 2023-06-23 ENCOUNTER — Telehealth: Payer: Self-pay

## 2023-06-23 ENCOUNTER — Encounter: Payer: Self-pay | Admitting: Family Medicine

## 2023-06-23 VITALS — BP 122/70 | HR 94 | Temp 98.7°F | Ht 62.0 in | Wt 124.0 lb

## 2023-06-23 DIAGNOSIS — G500 Trigeminal neuralgia: Secondary | ICD-10-CM

## 2023-06-23 DIAGNOSIS — R600 Localized edema: Secondary | ICD-10-CM

## 2023-06-23 DIAGNOSIS — Z794 Long term (current) use of insulin: Secondary | ICD-10-CM

## 2023-06-23 DIAGNOSIS — H1032 Unspecified acute conjunctivitis, left eye: Secondary | ICD-10-CM | POA: Diagnosis not present

## 2023-06-23 DIAGNOSIS — R2689 Other abnormalities of gait and mobility: Secondary | ICD-10-CM

## 2023-06-23 DIAGNOSIS — E1142 Type 2 diabetes mellitus with diabetic polyneuropathy: Secondary | ICD-10-CM

## 2023-06-23 DIAGNOSIS — H109 Unspecified conjunctivitis: Secondary | ICD-10-CM | POA: Insufficient documentation

## 2023-06-23 LAB — POCT GLYCOSYLATED HEMOGLOBIN (HGB A1C): Hemoglobin A1C: 15 % — AB (ref 4.0–5.6)

## 2023-06-23 LAB — COMPREHENSIVE METABOLIC PANEL
ALT: 30 U/L (ref 0–35)
AST: 30 U/L (ref 0–37)
Albumin: 3.2 g/dL — ABNORMAL LOW (ref 3.5–5.2)
Alkaline Phosphatase: 144 U/L — ABNORMAL HIGH (ref 39–117)
BUN: 12 mg/dL (ref 6–23)
CO2: 28 meq/L (ref 19–32)
Calcium: 8.7 mg/dL (ref 8.4–10.5)
Chloride: 97 meq/L (ref 96–112)
Creatinine, Ser: 0.75 mg/dL (ref 0.40–1.20)
GFR: 81.87 mL/min (ref >60.00)
Glucose, Bld: 394 mg/dL — ABNORMAL HIGH (ref 70–99)
Potassium: 4.3 meq/L (ref 3.5–5.1)
Sodium: 131 meq/L — ABNORMAL LOW (ref 135–145)
Total Bilirubin: 0.3 mg/dL (ref 0.2–1.2)
Total Protein: 6.8 g/dL (ref 6.0–8.3)

## 2023-06-23 LAB — CBC
HCT: 34.1 % — ABNORMAL LOW (ref 36.0–46.0)
Hemoglobin: 11.1 g/dL — ABNORMAL LOW (ref 12.0–15.0)
MCHC: 32.7 g/dL (ref 30.0–36.0)
MCV: 90.6 fL (ref 78.0–100.0)
Platelets: 558 10*3/uL — ABNORMAL HIGH (ref 150.0–400.0)
RBC: 3.76 Mil/uL — ABNORMAL LOW (ref 3.87–5.11)
RDW: 13.7 % (ref 11.5–15.5)
WBC: 11.2 10*3/uL — ABNORMAL HIGH (ref 4.0–10.5)

## 2023-06-23 LAB — HEMOGLOBIN A1C: Hgb A1c MFr Bld: 16.4 % — ABNORMAL HIGH (ref 4.6–6.5)

## 2023-06-23 LAB — TSH: TSH: 2.48 u[IU]/mL (ref 0.35–5.50)

## 2023-06-23 MED ORDER — ERYTHROMYCIN 5 MG/GM OP OINT
1.0000 | TOPICAL_OINTMENT | Freq: Four times a day (QID) | OPHTHALMIC | 0 refills | Status: AC
Start: 2023-06-23 — End: 2023-06-30

## 2023-06-23 MED ORDER — BASAGLAR KWIKPEN 100 UNIT/ML ~~LOC~~ SOPN: 59.0000 [IU] | PEN_INJECTOR | Freq: Every day | SUBCUTANEOUS | 2 refills | Status: DC

## 2023-06-23 MED ORDER — FREESTYLE LIBRE 3 READER DEVI: 1.0000 | Freq: Every day | 0 refills | Status: DC

## 2023-06-23 MED ORDER — FREESTYLE LIBRE 3 SENSOR MISC: 11 refills | Status: DC

## 2023-06-23 NOTE — Telephone Encounter (Signed)
Called [facility] and left a message with front desk then they called back and said they are trying to get in touch with the Patient to come on in today.

## 2023-06-23 NOTE — Patient Instructions (Signed)
Nice to see you. We will get lab work today and contact you with the results. Somebody should contact you from the ophthalmology office to try to get an appointment scheduled for this week for evaluation of your left eye issue. We will contact you with your lab results. If your eye becomes painful, your vision changes, or your eye become sensitive to light you need to go to the emergency department.

## 2023-06-23 NOTE — Assessment & Plan Note (Signed)
Chronic issue.  Suspect patient's balance difficulty could be related to her neuropathy.  Will refer for home health physical therapy to work on this.  For now she will continue gabapentin 800 mg 4 times daily.

## 2023-06-23 NOTE — Assessment & Plan Note (Signed)
Issue.  Suspect this is mostly related to her neuropathy.  We are referring her to physical therapy and we will also refer to neurology to evaluate for other underlying causes.

## 2023-06-23 NOTE — Telephone Encounter (Signed)
Called [facility] to get Patient an appointment per Dr. Birdie Sons. [facility] called back and is trying to get in touch with Patient to tell her to come on in today. Gave [facility] the correct number for the Patient.
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
## 2023-06-23 NOTE — Assessment & Plan Note (Signed)
Recent onset issue.  Discussed I do not think this is related to heart failure given her lack of other symptoms.  Will check lab work to evaluate for other underlying causes and then determine appropriate treatment.

## 2023-06-23 NOTE — Telephone Encounter (Signed)
Can you please contact [facility] and see if we can try to get the patient an appointment for this week for evaluation of her left eye issue?  She has conjunctival erythema, discharge from her eye, and her cornea has a general slight whiteness to it with a small thicker white pinpoint area.  She has no eye pain or photophobia.  She has no foreign body sensation.  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  I am concerned that she may have something more than conjunctivitis given the slight whiteness to her cornea.  If needed I can speak with one of the physicians there though they may just be able to get the patient scheduled.  If they ask her vision was checked today and the results are below.  Uncorrected vision Left eye 20/200 Right eye 20/200 Both eyes 20/200  Corrected vision Left eye 20/40 Right eye 20/40 Both eyes 20/40

## 2023-06-23 NOTE — Assessment & Plan Note (Addendum)
Chronic issue.  Poorly controlled.  Point-of-care A1c came back as greater than 15.  We will have her increase her Basaglar to 59 units.  We will send in a libre 3 for her to start using to check her sugar.  We will get her referred to endocrinology as well.  Checking lab work as outlined.  Discussed if her glucose was as elevated as it was on her last visit we would advise she go to the emergency department for evaluation.

## 2023-06-23 NOTE — Assessment & Plan Note (Addendum)
Patient has eye redness and discharge most consistent with conjunctivitis.  This could be bacterial or viral.  Discussed the potential that this could represent an iritis or keratitis given her prior history.  Her exam is not overly consistent with either of those things given that she has no foreign body sensation and no photophobia.  She has no pupillary changes either.  Discussed given the slight whiteness to her sclera I would recommend seeing ophthalmology today though she declined this and noted she would see them later this week.  Discussed the risk of loss of vision without seeing ophthalmology if this is something more serious than conjunctivitis.  We will trial erythromycin ophthalmic ointment.  We will get her into see ophthalmology urgently this week.  Advised to seek medical attention if she develops eye pain, vision changes, or photophobia.

## 2023-06-23 NOTE — Assessment & Plan Note (Signed)
Patient reports no symptoms related to this.  She has not been taking Trileptal in quite some time.  This will be removed from her medication list.

## 2023-06-24 ENCOUNTER — Telehealth: Payer: Self-pay

## 2023-06-24 NOTE — Telephone Encounter (Signed)
-----   Message from [name] sent at 06/24/2023 11:35 AM EDT ----- Please let the patient know that her blood sugar is elevated at 394.  One of her liver enzymes is mildly elevated.  Has she had any abdominal pain?  One of her protein levels is mildly low.  This could be contributing to the swelling in her legs.  She should try to add a boost that is low sugar once daily to see if that would be beneficial for her protein levels.  She is mildly anemic, her white blood cell count is mildly elevated, and her platelets are elevated.  Has she had an infection recently?  I had previously ordered a CT scan of her abdomen and pelvis given the weight loss that she had previously.  It looks like that was not done.  I would recommend that we get that done given her significant weight loss.

## 2023-06-24 NOTE — Telephone Encounter (Signed)
Lvm for pt to give office a call back in regards to lab see message below

## 2023-06-29 ENCOUNTER — Other Ambulatory Visit: Payer: Self-pay | Admitting: Family Medicine

## 2023-06-29 DIAGNOSIS — D72829 Elevated white blood cell count, unspecified: Secondary | ICD-10-CM

## 2023-07-01 ENCOUNTER — Ambulatory Visit
Admission: RE | Admit: 2023-07-01 | Discharge: 2023-07-01 | Disposition: A | Discharge status: Home / Self Care | Payer: Medicare Other | Source: Ambulatory Visit | Attending: Family Medicine | Admitting: Family Medicine

## 2023-07-01 DIAGNOSIS — R634 Abnormal weight loss: Secondary | ICD-10-CM | POA: Diagnosis not present

## 2023-07-01 DIAGNOSIS — R918 Other nonspecific abnormal finding of lung field: Secondary | ICD-10-CM | POA: Diagnosis not present

## 2023-07-01 DIAGNOSIS — I700 Atherosclerosis of aorta: Secondary | ICD-10-CM | POA: Insufficient documentation

## 2023-07-01 MED ORDER — IOHEXOL 300 MG/ML  SOLN
80.0000 mL | Freq: Once | INTRAMUSCULAR | Status: AC | PRN
  Administered 2023-07-01: 80 mL via INTRAVENOUS

## 2023-07-02 DIAGNOSIS — R600 Localized edema: Secondary | ICD-10-CM | POA: Diagnosis not present

## 2023-07-02 DIAGNOSIS — G500 Trigeminal neuralgia: Secondary | ICD-10-CM | POA: Diagnosis not present

## 2023-07-02 DIAGNOSIS — E1142 Type 2 diabetes mellitus with diabetic polyneuropathy: Secondary | ICD-10-CM | POA: Diagnosis not present

## 2023-07-02 DIAGNOSIS — H1032 Unspecified acute conjunctivitis, left eye: Secondary | ICD-10-CM | POA: Diagnosis not present

## 2023-07-02 DIAGNOSIS — Z7984 Long term (current) use of oral hypoglycemic drugs: Secondary | ICD-10-CM | POA: Diagnosis not present

## 2023-07-06 DIAGNOSIS — G500 Trigeminal neuralgia: Secondary | ICD-10-CM | POA: Diagnosis not present

## 2023-07-06 DIAGNOSIS — Z7984 Long term (current) use of oral hypoglycemic drugs: Secondary | ICD-10-CM | POA: Diagnosis not present

## 2023-07-06 DIAGNOSIS — R600 Localized edema: Secondary | ICD-10-CM | POA: Diagnosis not present

## 2023-07-06 DIAGNOSIS — H1032 Unspecified acute conjunctivitis, left eye: Secondary | ICD-10-CM | POA: Diagnosis not present

## 2023-07-06 DIAGNOSIS — E1142 Type 2 diabetes mellitus with diabetic polyneuropathy: Secondary | ICD-10-CM | POA: Diagnosis not present

## 2023-07-08 DIAGNOSIS — Z7984 Long term (current) use of oral hypoglycemic drugs: Secondary | ICD-10-CM | POA: Diagnosis not present

## 2023-07-08 DIAGNOSIS — E1142 Type 2 diabetes mellitus with diabetic polyneuropathy: Secondary | ICD-10-CM | POA: Diagnosis not present

## 2023-07-08 DIAGNOSIS — H1032 Unspecified acute conjunctivitis, left eye: Secondary | ICD-10-CM | POA: Diagnosis not present

## 2023-07-08 DIAGNOSIS — R600 Localized edema: Secondary | ICD-10-CM | POA: Diagnosis not present

## 2023-07-08 DIAGNOSIS — G500 Trigeminal neuralgia: Secondary | ICD-10-CM | POA: Diagnosis not present

## 2023-07-09 ENCOUNTER — Telehealth: Payer: Self-pay

## 2023-07-09 DIAGNOSIS — R600 Localized edema: Secondary | ICD-10-CM | POA: Diagnosis not present

## 2023-07-09 DIAGNOSIS — Z7984 Long term (current) use of oral hypoglycemic drugs: Secondary | ICD-10-CM | POA: Diagnosis not present

## 2023-07-09 DIAGNOSIS — G500 Trigeminal neuralgia: Secondary | ICD-10-CM | POA: Diagnosis not present

## 2023-07-09 DIAGNOSIS — E1142 Type 2 diabetes mellitus with diabetic polyneuropathy: Secondary | ICD-10-CM | POA: Diagnosis not present

## 2023-07-09 DIAGNOSIS — H1032 Unspecified acute conjunctivitis, left eye: Secondary | ICD-10-CM | POA: Diagnosis not present

## 2023-07-09 NOTE — Telephone Encounter (Signed)
Left message to call the office back regarding Dr. Purvis Sheffield message below.

## 2023-07-09 NOTE — Telephone Encounter (Signed)
-----   Message from [name] sent at 07/09/2023  2:12 PM EST ----- Noted. With those symptoms being chronic and the findings on the CT scan I would suspect it is more likely inflammatory than infectious. I would suggest having her see pulmonology to get their in put on this finding and her chronic symptoms. I can refer her if they are willing to see pulmonology.

## 2023-07-12 ENCOUNTER — Other Ambulatory Visit (HOSPITAL_COMMUNITY): Payer: Self-pay

## 2023-07-12 ENCOUNTER — Telehealth: Payer: Self-pay

## 2023-07-12 NOTE — Telephone Encounter (Signed)
Pharmacy Patient Advocate Encounter   Received notification from Patient Pharmacy that prior authorization for Insulin Glargine Solostar is required/requested.   Insurance verification completed.   The patient is insured through  [employer]  .   Per test claim:  BRAND Lantus Solostar is preferred by the insurance.  If suggested medication is appropriate, Please send in a new RX and discontinue this one. If not, please advise as to why it's not appropriate so that we may request a Prior Authorization.

## 2023-07-13 ENCOUNTER — Other Ambulatory Visit: Payer: Self-pay | Admitting: Family Medicine

## 2023-07-13 DIAGNOSIS — R9389 Abnormal findings on diagnostic imaging of other specified body structures: Secondary | ICD-10-CM

## 2023-07-14 DIAGNOSIS — Z7984 Long term (current) use of oral hypoglycemic drugs: Secondary | ICD-10-CM | POA: Diagnosis not present

## 2023-07-14 DIAGNOSIS — H1032 Unspecified acute conjunctivitis, left eye: Secondary | ICD-10-CM | POA: Diagnosis not present

## 2023-07-14 DIAGNOSIS — E1142 Type 2 diabetes mellitus with diabetic polyneuropathy: Secondary | ICD-10-CM | POA: Diagnosis not present

## 2023-07-14 DIAGNOSIS — R600 Localized edema: Secondary | ICD-10-CM | POA: Diagnosis not present

## 2023-07-14 DIAGNOSIS — G500 Trigeminal neuralgia: Secondary | ICD-10-CM | POA: Diagnosis not present

## 2023-07-15 DIAGNOSIS — E1142 Type 2 diabetes mellitus with diabetic polyneuropathy: Secondary | ICD-10-CM | POA: Diagnosis not present

## 2023-07-15 DIAGNOSIS — G500 Trigeminal neuralgia: Secondary | ICD-10-CM | POA: Diagnosis not present

## 2023-07-15 DIAGNOSIS — H1032 Unspecified acute conjunctivitis, left eye: Secondary | ICD-10-CM | POA: Diagnosis not present

## 2023-07-15 DIAGNOSIS — Z7984 Long term (current) use of oral hypoglycemic drugs: Secondary | ICD-10-CM | POA: Diagnosis not present

## 2023-07-15 DIAGNOSIS — R600 Localized edema: Secondary | ICD-10-CM | POA: Diagnosis not present

## 2023-07-16 ENCOUNTER — Telehealth: Payer: Self-pay | Admitting: Family Medicine

## 2023-07-16 DIAGNOSIS — R600 Localized edema: Secondary | ICD-10-CM | POA: Diagnosis not present

## 2023-07-16 DIAGNOSIS — E1142 Type 2 diabetes mellitus with diabetic polyneuropathy: Secondary | ICD-10-CM | POA: Diagnosis not present

## 2023-07-16 DIAGNOSIS — H1032 Unspecified acute conjunctivitis, left eye: Secondary | ICD-10-CM | POA: Diagnosis not present

## 2023-07-16 DIAGNOSIS — G500 Trigeminal neuralgia: Secondary | ICD-10-CM | POA: Diagnosis not present

## 2023-07-16 DIAGNOSIS — Z7984 Long term (current) use of oral hypoglycemic drugs: Secondary | ICD-10-CM | POA: Diagnosis not present

## 2023-07-16 NOTE — Telephone Encounter (Signed)
[name], Home health Care, [phone]. Patient's rt should is hurting from a few weeks ago, pt was seen. Tillman Sers is requesting a patch, or cream for the pain. Pharmacy is [facility], [address] and [address].

## 2023-07-16 NOTE — Telephone Encounter (Signed)
Pt returning call

## 2023-07-16 NOTE — Telephone Encounter (Signed)
Left message to call the office back.
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
## 2023-07-16 NOTE — Telephone Encounter (Signed)
Fax received requesting orders for PT.  It is okay to give verbal orders for PT 1 time per week for 4 weeks.  Please contact [facility] home care to give these orders.

## 2023-07-16 NOTE — Telephone Encounter (Signed)
Called [facility] and gave verbal orders for PT  1 time a week for 4 weeks.

## 2023-07-17 DIAGNOSIS — H1032 Unspecified acute conjunctivitis, left eye: Secondary | ICD-10-CM | POA: Diagnosis not present

## 2023-07-17 DIAGNOSIS — G500 Trigeminal neuralgia: Secondary | ICD-10-CM | POA: Diagnosis not present

## 2023-07-17 DIAGNOSIS — Z7984 Long term (current) use of oral hypoglycemic drugs: Secondary | ICD-10-CM | POA: Diagnosis not present

## 2023-07-17 DIAGNOSIS — E1142 Type 2 diabetes mellitus with diabetic polyneuropathy: Secondary | ICD-10-CM | POA: Diagnosis not present

## 2023-07-17 DIAGNOSIS — R600 Localized edema: Secondary | ICD-10-CM | POA: Diagnosis not present

## 2023-07-19 NOTE — Telephone Encounter (Signed)
Pt returning call

## 2023-07-19 NOTE — Telephone Encounter (Signed)
Left message to call the office back.

## 2023-07-20 NOTE — Telephone Encounter (Signed)
Verbal order for PT given per ok by Dr. Birdie Sons

## 2023-07-20 NOTE — Telephone Encounter (Signed)
Left message to return call to our office.  

## 2023-07-21 ENCOUNTER — Encounter: Payer: Self-pay | Admitting: Pulmonary Disease

## 2023-07-23 DIAGNOSIS — Z7984 Long term (current) use of oral hypoglycemic drugs: Secondary | ICD-10-CM | POA: Diagnosis not present

## 2023-07-23 DIAGNOSIS — E1142 Type 2 diabetes mellitus with diabetic polyneuropathy: Secondary | ICD-10-CM | POA: Diagnosis not present

## 2023-07-23 DIAGNOSIS — H1032 Unspecified acute conjunctivitis, left eye: Secondary | ICD-10-CM | POA: Diagnosis not present

## 2023-07-23 DIAGNOSIS — G500 Trigeminal neuralgia: Secondary | ICD-10-CM | POA: Diagnosis not present

## 2023-07-23 DIAGNOSIS — R600 Localized edema: Secondary | ICD-10-CM | POA: Diagnosis not present

## 2023-07-24 ENCOUNTER — Other Ambulatory Visit: Payer: Self-pay | Admitting: Family Medicine

## 2023-07-24 DIAGNOSIS — G500 Trigeminal neuralgia: Secondary | ICD-10-CM

## 2023-07-27 NOTE — Telephone Encounter (Signed)
I believe the patient advised me she was not taking this anymore.  Can you find out if she still taking this?

## 2023-07-27 NOTE — Telephone Encounter (Signed)
LMTCB

## 2023-07-27 NOTE — Telephone Encounter (Signed)
Medication was discontinued on 06/23/2023. Is it okay to refuse the refill request?

## 2023-07-29 ENCOUNTER — Telehealth: Payer: Self-pay
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
## 2023-07-30 ENCOUNTER — Other Ambulatory Visit (INDEPENDENT_AMBULATORY_CARE_PROVIDER_SITE_OTHER): Payer: Medicare Other

## 2023-07-30 DIAGNOSIS — D72829 Elevated white blood cell count, unspecified: Secondary | ICD-10-CM

## 2023-07-30 LAB — CBC WITH DIFFERENTIAL/PLATELET
Basophils Absolute: 0 10*3/uL (ref 0.0–0.1)
Basophils Relative: 0.6 % (ref 0.0–3.0)
Eosinophils Absolute: 0.2 10*3/uL (ref 0.0–0.7)
Eosinophils Relative: 2.2 % (ref 0.0–5.0)
HCT: 39.4 % (ref 36.0–46.0)
Hemoglobin: 13 g/dL (ref 12.0–15.0)
Lymphocytes Relative: 36.7 % (ref 12.0–46.0)
Lymphs Abs: 2.8 10*3/uL (ref 0.7–4.0)
MCHC: 32.9 g/dL (ref 30.0–36.0)
MCV: 89.8 fL (ref 78.0–100.0)
Monocytes Absolute: 0.4 10*3/uL (ref 0.1–1.0)
Monocytes Relative: 5.3 % (ref 3.0–12.0)
Neutro Abs: 4.3 10*3/uL (ref 1.4–7.7)
Neutrophils Relative %: 55.2 % (ref 43.0–77.0)
Platelets: 231 10*3/uL (ref 150.0–400.0)
RBC: 4.39 Mil/uL (ref 3.87–5.11)
RDW: 13.6 % (ref 11.5–15.5)
WBC: 7.7 10*3/uL (ref 4.0–10.5)
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
## 2023-07-30 NOTE — Telephone Encounter (Signed)
LMTCB

## 2023-08-03 ENCOUNTER — Telehealth: Payer: Self-pay

## 2023-08-03 NOTE — Telephone Encounter (Signed)
Received paperwork from [facility] about missed visit. Placed in to be review folders

## 2023-08-03 NOTE — Telephone Encounter (Signed)
Noted. Patient refused visit per fax.

## 2023-08-04 ENCOUNTER — Telehealth: Payer: Self-pay | Admitting: *Deleted

## 2023-08-04 NOTE — Telephone Encounter (Signed)
Called Demetria at [address] [facility] and gave verbal orders for a social worker per Dr. Birdie Sons. Marland Kitchen
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
## 2023-08-04 NOTE — Telephone Encounter (Signed)
Please call patient needs Uacr and repeat A1c if we can with A1c last on 06/23/23 @ 16.4.

## 2023-08-04 NOTE — Telephone Encounter (Signed)
Noted. You can give verbal orders for a Child psychotherapist.

## 2023-08-05 ENCOUNTER — Telehealth: Payer: Self-pay

## 2023-08-05 NOTE — Telephone Encounter (Signed)
Received HH POC in efax folder.  Printed and placed in basket for signature.  Order # [id number]

## 2023-08-05 NOTE — Telephone Encounter (Addendum)
Left a voice message asking to give myself or Thurmond Butts a call back at the office.   Patient needs to get rescheduled for a missed lab appointment. UACR, is needed

## 2023-08-06 ENCOUNTER — Other Ambulatory Visit: Payer: Medicare Other

## 2023-08-06 DIAGNOSIS — H1032 Unspecified acute conjunctivitis, left eye: Secondary | ICD-10-CM | POA: Diagnosis not present

## 2023-08-06 DIAGNOSIS — G500 Trigeminal neuralgia: Secondary | ICD-10-CM | POA: Diagnosis not present

## 2023-08-06 DIAGNOSIS — E1142 Type 2 diabetes mellitus with diabetic polyneuropathy: Secondary | ICD-10-CM | POA: Diagnosis not present

## 2023-08-06 DIAGNOSIS — Z7984 Long term (current) use of oral hypoglycemic drugs: Secondary | ICD-10-CM | POA: Diagnosis not present

## 2023-08-06 DIAGNOSIS — R600 Localized edema: Secondary | ICD-10-CM | POA: Diagnosis not present

## 2023-08-06 NOTE — Telephone Encounter (Signed)
Faxed to [facility] at [phone] and received an okay confirmation then sent to scanning.

## 2023-08-06 NOTE — Telephone Encounter (Signed)
Signed and placed and signed folder.

## 2023-08-13 ENCOUNTER — Other Ambulatory Visit: Payer: Medicare Other

## 2023-08-26 ENCOUNTER — Other Ambulatory Visit: Payer: Medicare Other

## 2023-08-31 ENCOUNTER — Telehealth: Payer: Self-pay

## 2023-08-31 NOTE — Telephone Encounter (Signed)
 Received PA request for Basaglar.  See screenshot.

## 2023-09-01 ENCOUNTER — Telehealth: Payer: Self-pay | Admitting: *Deleted

## 2023-09-01 ENCOUNTER — Telehealth: Payer: Self-pay

## 2023-09-01 ENCOUNTER — Other Ambulatory Visit (HOSPITAL_COMMUNITY): Payer: Self-pay

## 2023-09-01 DIAGNOSIS — E1142 Type 2 diabetes mellitus with diabetic polyneuropathy: Secondary | ICD-10-CM

## 2023-09-01 MED ORDER — BASAGLAR KWIKPEN 100 UNIT/ML ~~LOC~~ SOPN: 59.0000 [IU] | PEN_INJECTOR | Freq: Every day | SUBCUTANEOUS | 2 refills | Status: DC

## 2023-09-01 MED ORDER — LANTUS SOLOSTAR 100 UNIT/ML ~~LOC~~ SOPN: 59.0000 [IU] | PEN_INJECTOR | Freq: Every day | SUBCUTANEOUS | 3 refills | Status: DC

## 2023-09-01 NOTE — Telephone Encounter (Signed)
 Vm left to cb

## 2023-09-01 NOTE — Telephone Encounter (Signed)
 Refill has been sent.

## 2023-09-01 NOTE — Telephone Encounter (Signed)
 Lantus sent to pharmacy.  Please contact the patient let her know this is changing.  She also needs follow-up with me or somebody else to transfer care.

## 2023-09-01 NOTE — Telephone Encounter (Signed)
 Pharmacy Patient Advocate Encounter   Received notification from Pt Calls Messages that prior authorization for Basaglar  is required/requested.   Insurance verification completed.   The patient is insured through [facility] .   Per test claim:  Lantus  is preferred by the insurance.  If suggested medication is appropriate, Please send in a new RX and discontinue this one. If not, please advise as to why it's not appropriate so that we may request a Prior Authorization. Please note, some preferred medications may still require a PA

## 2023-09-01 NOTE — Telephone Encounter (Signed)
 Lantus is preferred, please resend as Lantus, copay for Lantus brand should be $35.00

## 2023-09-01 NOTE — Telephone Encounter (Signed)
 Detailed vm left informing pt that medication had to be switched to Lantus  due to insurance not covering it and preferring the lantus . Provider would like pt to schedule a follow up with him or schedule a transfer of care appointment with a new provider. When pt calls back please have p[t schedule.    Mychart msg has been sent to pt informing as well.

## 2023-09-01 NOTE — Telephone Encounter (Signed)
 Copied from CRM [id number]. Topic: Clinical - Prescription Issue >> Sep 01, 2023 11:00 AM Melissa C wrote: Reason for CRM: patient stated that a refill request was sent in for Insulin  Glargine (BASAGLAR  KWIKPEN) 100 UNIT/ML. Agent doesn't see anything pending, however patient only has one injection left. Stated the request had been sent because her insurance didn't want to pay for it anymore where she was getting it. Please advise with patient. Thank you

## 2023-09-02 ENCOUNTER — Telehealth: Payer: Self-pay

## 2023-09-02 NOTE — Telephone Encounter (Signed)
 Pt has been previously enrolled for Basaglar pt assistance through [employer]. Needing 2025 re enrollment, sending application to pt and mychart message.

## 2023-09-03 NOTE — Telephone Encounter (Signed)
 Called and informed pt and pt is already scheduled with you 09-08-23

## 2023-09-06 NOTE — Telephone Encounter (Signed)
 Noted.

## 2023-09-08 ENCOUNTER — Other Ambulatory Visit (HOSPITAL_COMMUNITY): Payer: Self-pay

## 2023-09-08 ENCOUNTER — Ambulatory Visit (INDEPENDENT_AMBULATORY_CARE_PROVIDER_SITE_OTHER): Payer: Medicare Other | Admitting: Family Medicine

## 2023-09-08 ENCOUNTER — Encounter: Payer: Self-pay | Admitting: Family Medicine

## 2023-09-08 ENCOUNTER — Telehealth: Payer: Self-pay | Admitting: Family Medicine

## 2023-09-08 VITALS — BP 116/68 | HR 88 | Temp 98.7°F | Ht 63.0 in | Wt 114.0 lb

## 2023-09-08 DIAGNOSIS — I10 Essential (primary) hypertension: Secondary | ICD-10-CM

## 2023-09-08 DIAGNOSIS — M545 Low back pain, unspecified: Secondary | ICD-10-CM | POA: Diagnosis not present

## 2023-09-08 DIAGNOSIS — E1142 Type 2 diabetes mellitus with diabetic polyneuropathy: Secondary | ICD-10-CM | POA: Diagnosis not present

## 2023-09-08 DIAGNOSIS — Z7984 Long term (current) use of oral hypoglycemic drugs: Secondary | ICD-10-CM

## 2023-09-08 DIAGNOSIS — R296 Repeated falls: Secondary | ICD-10-CM

## 2023-09-08 DIAGNOSIS — F32A Depression, unspecified: Secondary | ICD-10-CM

## 2023-09-08 DIAGNOSIS — H1032 Unspecified acute conjunctivitis, left eye: Secondary | ICD-10-CM | POA: Diagnosis not present

## 2023-09-08 DIAGNOSIS — F419 Anxiety disorder, unspecified: Secondary | ICD-10-CM

## 2023-09-08 MED ORDER — ERYTHROMYCIN 5 MG/GM OP OINT: 1.0000 | TOPICAL_OINTMENT | Freq: Four times a day (QID) | OPHTHALMIC | 0 refills | Status: AC

## 2023-09-08 MED ORDER — METFORMIN HCL 1000 MG PO TABS: ORAL_TABLET | ORAL | 1 refills | Status: DC

## 2023-09-08 MED ORDER — SERTRALINE HCL 100 MG PO TABS: 200.0000 mg | ORAL_TABLET | Freq: Every day | ORAL | 1 refills | Status: DC

## 2023-09-08 MED ORDER — EZETIMIBE 10 MG PO TABS: ORAL_TABLET | ORAL | 1 refills | Status: DC

## 2023-09-08 MED ORDER — LANTUS SOLOSTAR 100 UNIT/ML ~~LOC~~ SOPN: 65.0000 [IU] | PEN_INJECTOR | Freq: Every day | SUBCUTANEOUS | Status: DC

## 2023-09-08 MED ORDER — AMLODIPINE BESYLATE 5 MG PO TABS: 5.0000 mg | ORAL_TABLET | Freq: Every day | ORAL | 1 refills | Status: DC

## 2023-09-08 NOTE — Assessment & Plan Note (Signed)
 Exam findings are concerning for conjunctivitis.  Could be bacterial or viral though given her prior history I do think antibiotics are warranted.  Erythromycin  eye ointment sent to pharmacy.  Will get her scheduled with her eye doctor as well.  If she starts to lose her vision or develops eye pain or photophobia she needs to be evaluated by ophthalmology immediately.

## 2023-09-08 NOTE — Assessment & Plan Note (Addendum)
 Chronic issue.  Has been poorly controlled.  She will increase Lantus  65 units daily.  She will do for hypoglycemia and if this occurs she will let us  know.  Will refer to endocrinology.

## 2023-09-08 NOTE — Assessment & Plan Note (Addendum)
 Possibly related to perceived leg weakness versus balance issues from neuropathy.  Will refer for physical therapy.  Currently with no headaches.  Advised to seek medical attention for any headaches or neurological changes.

## 2023-09-08 NOTE — Progress Notes (Signed)
 Nelly Banco, MD Phone: [phone]  Tammy Acosta is a 68 y.o. female who presents today for f/u.  DIABETES Disease Monitoring: Blood Sugar ranges-high    Optho- UTD Medications: Compliance- taking basaglar  59 u Hypoglycemic symptoms- no  Fall: She has had a couple of falls recently.  She notes she fell coming down the stairs yesterday and fell on her bottom.  She does note she hit her head though has had no headaches.  No vision changes.  She is not on a blood thinner.  She notes some back pain after her fall.  No radiation of the pain.  She just feels as though her legs are weak and this has been an ongoing issue.  She has not done physical therapy for her legs.  Left eye issue: Patient notes she saw ophthalmology after her last visit was given an antibiotic.  Symptoms resolved though have recurred over the last few days with some eye redness.  She notes no vision changes though does report her left eye vision has been completely blurred since her last infection.  No eye pain.  No photophobia.   Social History   Tobacco Use  Smoking Status Every Day   Current packs/day: 0.00   Types: Cigarettes  Smokeless Tobacco Never    Current Outpatient Medications on File Prior to Visit  Medication Sig Dispense Refill   blood glucose meter kit and supplies Dispense One touch meter.  E11.9 1 each 0   Blood Glucose Monitoring Suppl (ONE TOUCH ULTRA 2) w/Device KIT Dispense 1 meter to use to test blood glucose once daily. Dx code: E11.42. 1 kit 0   Continuous Glucose Receiver (FREESTYLE LIBRE 3 READER) DEVI 1 each by Does not apply route daily. Use to check glucose continuously. 1 each 0   Continuous Glucose Sensor (FREESTYLE LIBRE 3 SENSOR) MISC Place 1 sensor on the skin every 14 days. Use to check glucose continuously 2 each 11   gabapentin  (NEURONTIN ) 800 MG tablet TAKE 1 TABLET(800 MG) BY MOUTH FOUR TIMES DAILY 360 tablet 0   glucose blood (ONETOUCH ULTRA) test strip USE 1 STRIP TO  CHECK GLUCOSE UP TO THREE TIMES DAILY AS DIRECTED. 100 each 1   Insulin  Pen Needle (PEN NEEDLES) 32G X 5 MM MISC 1 pen by Does not apply route daily. Use to inject insulin  daily 100 each 4   Lancets (ONETOUCH DELICA PLUS LANCET33G) MISC USE   TO CHECK GLUCOSE UP TO THREE TIMES DAILY AS DIRECTED 100 each 1   lisinopril  (ZESTRIL ) 40 MG tablet TAKE 1 TABLET(40 MG) BY MOUTH DAILY 90 tablet 3   rosuvastatin  (CRESTOR ) 40 MG tablet TAKE 1 TABLET(40 MG) BY MOUTH DAILY 90 tablet 3   tiZANidine  (ZANAFLEX ) 4 MG tablet TAKE 1 TABLET BY MOUTH EVERY 8 HOURS AS NEEDED FOR MUSCLE SPASM 30 tablet 0   No current facility-administered medications on file prior to visit.     ROS see history of present illness  Objective  Physical Exam Vitals:   09/08/23 1345  BP: 116/68  Pulse: 88  Temp: 98.7 F (37.1 C)  SpO2: 100%    BP Readings from Last 3 Encounters:  09/08/23 116/68  06/23/23 122/70  05/07/23 114/74   Wt Readings from Last 3 Encounters:  09/08/23 114 lb (51.7 kg)  06/23/23 124 lb (56.2 kg)  05/07/23 119 lb 9.6 oz (54.3 kg)    Physical Exam Constitutional:      General: She is not in acute distress.    Appearance:  She is not diaphoretic.  Eyes:     Comments: Left eye with conjunctival erythema, PERRL, cornea looks grossly normal, no apparent photophobia  Cardiovascular:     Rate and Rhythm: Normal rate and regular rhythm.     Heart sounds: Normal heart sounds.  Pulmonary:     Effort: Pulmonary effort is normal.     Breath sounds: Normal breath sounds.  Musculoskeletal:     Comments: No midline spine tenderness, no midline spine step-off, there is some soft tissue tenderness in her lumbar spine  Skin:    General: Skin is warm and dry.  Neurological:     Mental Status: She is alert.     Comments: 5/5 strength in bilateral quads, hamstrings, plantar and dorsiflexion, sensation to light touch intact in bilateral LE      Assessment/Plan: Please see individual problem  list.  Acute bilateral low back pain without sciatica Assessment & Plan: Patient with back pain after a fall yesterday.  She does have some soft tissue tenderness in her low back.  Discussed that her insurance may not cover an x-ray so we jointly opted to defer at this time.  If her pain is not improving she will let us  know.  If she develops radiating pain she will let us  know.  If her pain worsens she will let us  know.  She can work with physical therapy for her back and balance.   Essential (primary) hypertension -     amLODIPine  Besylate; Take 1 tablet (5 mg total) by mouth daily. TAKE 1 TABLET(5 MG) BY MOUTH DAILY  Dispense: 90 tablet; Refill: 1  Type 2 diabetes mellitus with diabetic polyneuropathy, without long-term current use of insulin  (HCC) Assessment & Plan: Chronic issue.  Has been poorly controlled.  She will increase Lantus  65 units daily.  She will do for hypoglycemia and if this occurs she will let us  know.  Will refer to endocrinology.  Orders: -     metFORMIN  HCl; TAKE 1 TABLET(1000 MG) BY MOUTH TWICE DAILY WITH A MEAL  Dispense: 180 tablet; Refill: 1 -     Ambulatory referral to Endocrinology  Anxiety and depression -     Sertraline  HCl; Take 2 tablets (200 mg total) by mouth daily.  Dispense: 180 tablet; Refill: 1  Acute conjunctivitis of left eye, unspecified acute conjunctivitis type Assessment & Plan: Exam findings are concerning for conjunctivitis.  Could be bacterial or viral though given her prior history I do think antibiotics are warranted.  Erythromycin  eye ointment sent to pharmacy.  Will get her scheduled with her eye doctor as well.  If she starts to lose her vision or develops eye pain or photophobia she needs to be evaluated by ophthalmology immediately.  Orders: -     Erythromycin ; Place 1 Application into the left eye 4 (four) times daily for 7 days.  Dispense: 28 g; Refill: 0  Falls Assessment & Plan: Possibly related to perceived leg weakness  versus balance issues from neuropathy.  Will refer for physical therapy.  Currently with no headaches.  Advised to seek medical attention for any headaches or neurological changes.  Orders: -     Ambulatory referral to Physical Therapy  Other orders -     Ezetimibe ; TAKE 1 TABLET(10 MG) BY MOUTH DAILY  Dispense: 90 tablet; Refill: 1 -     Lantus  SoloStar; Inject 65 Units into the skin daily.    Return in about 3 months (around 12/07/2023) for Transfer of care.   Lyell Samuel  Lovetta Rucks, MD [facility]

## 2023-09-08 NOTE — Assessment & Plan Note (Signed)
 Patient with back pain after a fall yesterday.  She does have some soft tissue tenderness in her low back.  Discussed that her insurance may not cover an x-ray so we jointly opted to defer at this time.  If her pain is not improving she will let us  know.  If she develops radiating pain she will let us  know.  If her pain worsens she will let us  know.  She can work with physical therapy for her back and balance.

## 2023-09-08 NOTE — Patient Instructions (Addendum)
 Nice to see you. Physical therapy should contact you. Ophthalmology and endocrinology should contact you to schedule appointments. If you develop vision changes, eye pain, or sensitivity to light you need to get evaluated in the emergency room or with ophthalmology immediately. Please use the topical erythromycin  for the left eye issue. We are increasing your Lantus  to 65 units daily.  If you start to notice low blood sugar symptoms such as shakiness, jitteriness, sweatiness, or feeling poorly with this dose increase please let us  know.

## 2023-09-08 NOTE — Telephone Encounter (Signed)
 Called the office of [facility] at [phone] selecting the option for the [facility] location and the phones rings and takes me back to the phone tree options again. I will try calling again.

## 2023-09-08 NOTE — Telephone Encounter (Signed)
 Can you call [facility] and get them to set up an appointment for this patient?  She was previously seen by them for a left eye infection and she has had recurrence of this over the last couple of days.

## 2023-09-09 ENCOUNTER — Telehealth: Payer: Self-pay

## 2023-09-09 DIAGNOSIS — R2689 Other abnormalities of gait and mobility: Secondary | ICD-10-CM

## 2023-09-09 NOTE — Telephone Encounter (Signed)
Copied from CRM [id number]. Topic: Clinical - Medical Advice >> Sep 09, 2023 12:48 PM Theodis Sato wrote: Reason for CRM: Cesiah Kettell (patients daughter in law) is requesting to speak with Dr. Kermit Balo nurse as she is seeking next step advise on putting patient into a nursing home please call [phone]  as patient has fallen 3 times within the last week but declined to speak with a triage nurse.

## 2023-09-09 NOTE — Telephone Encounter (Signed)
Routed request for appt to [facility]

## 2023-09-10 NOTE — Telephone Encounter (Signed)
We could place a referral for a social worker to reach out to them if they would like. The patient may not meet criteria for a SNF and may be more appropriate for an ALF, though we could see what the social worker says. The other option is for the family to start looking at assisted living facilities on their own.
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
## 2023-09-14 NOTE — Addendum Note (Signed)
Addended by: Glori Luis on: 09/14/2023 02:19 PM   Modules accepted: Orders

## 2023-09-14 NOTE — Telephone Encounter (Signed)
Social worker referral placed. I can not take her license away from her. I can advise that she not drive though would need to discuss in person to determine if I felt this was necessary. If the family wants her driving privileges evaluated they can see about submitting a medical request for evaluation through the [facility]

## 2023-09-14 NOTE — Telephone Encounter (Signed)
Left message to call the office back regarding DR. sONNENBERG'S message below.

## 2023-09-15 ENCOUNTER — Telehealth: Payer: Self-pay | Admitting: *Deleted

## 2023-09-15 NOTE — Telephone Encounter (Signed)
Pt has been approved for Basaglar pt assistance through [employer] in a separate encounter, signing off on duplicate encounter.

## 2023-09-15 NOTE — Progress Notes (Unsigned)
Complex Care Management Note Care Guide Note  09/15/2023 Name: [name] MRN: [id number] DOB: 07/28/1956   Complex Care Management Outreach Attempts: An unsuccessful telephone outreach was attempted today to offer the patient information about available complex care management services.  Follow Up Plan:  Additional outreach attempts will be made to offer the patient complex care management information and services.   Encounter Outcome:  No Answer  Burman Nieves, CMA, Care Guide [facility]  [facility], [facility] Guide Direct Dial: [phone]  Fax: [phone] Website: [email or web address]

## 2023-09-16 NOTE — Progress Notes (Signed)
Complex Care Management Note  Care Guide Note 09/16/2023 Name: Tammy Acosta MRN: [id number] DOB: 12-Apr-1956  Tammy Acosta is a 68 y.o. year old female who sees Birdie Sons, Yehuda Mao, MD for primary care. I reached out to Tammy Acosta by phone today to offer complex care management services.  Ms. Schobel was given information about Complex Care Management services today including:   The Complex Care Management services include support from the care team which includes your Nurse Coordinator, Clinical Social Worker, or Pharmacist.  The Complex Care Management team is here to help remove barriers to the health concerns and goals most important to you. Complex Care Management services are voluntary, and the patient may decline or stop services at any time by request to their care team member.   Complex Care Management Consent Status: Patient agreed to services and verbal consent obtained.   Follow up plan:  Telephone appointment with complex care management team member scheduled for:  09/20/2023  Encounter Outcome:  Patient Scheduled  Burman Nieves, CMA, Care Guide [facility]  [facility], [facility] Guide Direct Dial: [phone]  Fax: [phone] Website: [email or web address]

## 2023-09-17 ENCOUNTER — Encounter: Payer: Self-pay | Admitting: Diagnostic Neuroimaging

## 2023-09-17 ENCOUNTER — Ambulatory Visit: Payer: Medicare Other | Admitting: Diagnostic Neuroimaging

## 2023-09-20 ENCOUNTER — Ambulatory Visit: Payer: Self-pay | Admitting: *Deleted
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
## 2023-09-20 NOTE — Patient Instructions (Signed)
Visit Information  Thank you for taking time to visit with me today. Please don't hesitate to contact me if I can be of assistance to you.   Following are the goals we discussed today:   Goals Addressed             This Visit's Progress    Housing resources       Activities and task to complete in order to accomplish goals.   Follow up with housing resources discussed (CSW to completed at housing search using [employer] and will follow up regarding findings) Please continue to follow up with any friends and/or family that may be able to assist with housing search as well.         Our next appointment is by telephone on 09/27/23 at 10am  Please call the care guide team at [phone] if you need to cancel or reschedule your appointment.   If you are experiencing a Mental Health or Behavioral Health Crisis or need someone to talk to, please call 911   Patient verbalizes understanding of instructions and care plan provided today and agrees to view in MyChart. Active MyChart status and patient understanding of how to access instructions and care plan via MyChart confirmed with patient.     Telephone follow up appointment with care management team member scheduled for: 09/27/23  Verna Czech, LCSW [place]  [facility], [facility] Licensed Clinical Social Worker Care Coordinator  Direct Dial: [phone]

## 2023-09-21 ENCOUNTER — Telehealth: Payer: Self-pay | Admitting: *Deleted

## 2023-09-21 NOTE — Patient Instructions (Signed)
Visit Information  Thank you for taking time to visit with me today. Please don't hesitate to contact me if I can be of assistance to you.   Following are the goals we discussed today:   Goals Addressed             This Visit's Progress    Housing resources       Activities and task to complete in order to accomplish goals.   Follow up with housing resources provided by mail  Please continue to follow up with any friends and/or family that may be able to assist with housing search as well.         Our next appointment is by telephone on 10/05/23 at 10am  Please call the care guide team at [phone] if you need to cancel or reschedule your appointment.   If you are experiencing a Mental Health or Behavioral Health Crisis or need someone to talk to, please call 911   Patient verbalizes understanding of instructions and care plan provided today and agrees to view in MyChart. Active MyChart status and patient understanding of how to access instructions and care plan via MyChart confirmed with patient.     Telephone follow up appointment with care management team member scheduled for: 10/05/23  Verna Czech, LCSW [place]  [facility], [facility] Licensed Clinical Social Worker Care Coordinator  Direct Dial: [phone]

## 2023-09-21 NOTE — Telephone Encounter (Signed)
Called daughter in law and she stated that she would have to find somewhere to go because pt does not want to take care of herself. She stated that pt still smokes and does not go to dr. appointment that she is supposed to go to.

## 2023-09-21 NOTE — Patient Outreach (Signed)
  Care Coordination   Follow Up Visit Note   09/21/2023 Name: Tammy Acosta MRN: [id number] DOB: July 22, 1956  Tammy Acosta is a 68 y.o. year old female who sees Birdie Sons, Yehuda Mao, MD for primary care. I spoke with  Tammy Acosta by phone today.  What matters to the patients health and wellness today?  Alternative housing options    Goals Addressed             This Visit's Progress    Housing resources       Activities and task to complete in order to accomplish goals.   Follow up with housing resources provided by mail  Please continue to follow up with any friends and/or family that may be able to assist with housing search as well.         SDOH assessments and interventions completed:  No     Care Coordination Interventions:  Yes, provided  Interventions Today    Flowsheet Row Most Recent Value  Chronic Disease   Chronic disease during today's visit Diabetes, Hypertension (HTN)  [housing challenges]  General Interventions   General Interventions Discussed/Reviewed General Interventions Reviewed, [employer], Communication with  [confirmed continued need for housing resources -received a 30 day notice to move from current residence-housing resources discussed, list to be provided for review-shelter, mobile home options, apt listings sent. confirmed daughter will assist]  Communication with --  [employer]  Senior Housing complexes contacted for availabilities-confirmed waiting lists of 6 months to a year]  Education Interventions   Education Provided Provided Education  Provided Verbal Education On [employer] options, mobile home, shelter care apartment living]       Follow up plan: Follow up call scheduled for 10/05/23    Encounter Outcome:  Patient Visit Completed

## 2023-09-22 NOTE — Telephone Encounter (Signed)
Noted

## 2023-09-27 ENCOUNTER — Ambulatory Visit: Payer: Self-pay | Admitting: *Deleted

## 2023-09-27 NOTE — Patient Outreach (Signed)
  Care Coordination   Follow Up Visit Note   09/27/2023 Name: Tammy Acosta MRN: [id number] DOB: 12-18-1955  Tammy Acosta is a 68 y.o. year old female who sees Birdie Sons, Yehuda Mao, MD for primary care. I spoke with  Tammy Acosta by phone today.  What matters to the patients health and wellness today?  Additional housing options    Goals Addressed             This Visit's Progress    Housing resources       Activities and task to complete in order to accomplish goals.   Follow up with housing resources re-sent  by mail  Please continue to follow up with any friends and/or family that may be able to assist with housing search as well.         SDOH assessments and interventions completed:  No     Care Coordination Interventions:  Yes, provided  Interventions Today    Flowsheet Row Most Recent Value  Chronic Disease   Chronic disease during today's visit Diabetes, Hypertension (HTN)  General Interventions   General Interventions Discussed/Reviewed General Interventions Reviewed, [employer]  [housing resources mailed to patient, however address listed incorrect. Address updated-resources to be re-sent. Patient continues to look for housing due to request to move]  Education Interventions   Education Provided Provided Education  Provided Verbal Education On [employer] resources in [place]]  Mental Health Interventions   Mental Health Discussed/Reviewed Other  [Emotional support continue to be provided regarding challenges with housing-daughter continues to assist but is limited-pt will speak to DIL as well]       Follow up plan: Follow up call scheduled for 10/11/23    Encounter Outcome:  Patient Visit Completed

## 2023-09-27 NOTE — Patient Instructions (Signed)
Visit Information  Thank you for taking time to visit with me today. Please don't hesitate to contact me if I can be of assistance to you.   Following are the goals we discussed today:   Goals Addressed             This Visit's Progress    Housing resources       Activities and task to complete in order to accomplish goals.   Follow up with housing resources re-sent  by mail  Please continue to follow up with any friends and/or family that may be able to assist with housing search as well.         Our next appointment is by telephone on 10/11/23 at 10am  Please call the care guide team at [phone] if you need to cancel or reschedule your appointment.   If you are experiencing a Mental Health or Behavioral Health Crisis or need someone to talk to, please call 911   Patient verbalizes understanding of instructions and care plan provided today and agrees to view in MyChart. Active MyChart status and patient understanding of how to access instructions and care plan via MyChart confirmed with patient.     Telephone follow up appointment with care management team member scheduled for: 10/11/23  Verna Czech, LCSW [place]  [facility], [facility] Licensed Clinical Social Worker Care Coordinator  Direct Dial: [phone]

## 2023-10-05 ENCOUNTER — Ambulatory Visit: Payer: Self-pay | Admitting: *Deleted

## 2023-10-05 NOTE — Patient Instructions (Signed)
Visit Information  Thank you for taking time to visit with me today. Please don't hesitate to contact me if I can be of assistance to you.   Following are the goals we discussed today:   Goals Addressed             This Visit's Progress    Housing resources       Activities and task to complete in order to accomplish goals.   Follow up with housing resources sent  by mail  Please continue to follow up with any friends and/or family that may be able to assist with housing search as well. [employer] [facility] referral placed for additional housing resources         Our next appointment is by telephone on 10/19/23 at 11am  Please call the care guide team at [phone] if you need to cancel or reschedule your appointment.   If you are experiencing a Mental Health or Behavioral Health Crisis or need someone to talk to, please call 911   Patient verbalizes understanding of instructions and care plan provided today and agrees to view in MyChart. Active MyChart status and patient understanding of how to access instructions and care plan via MyChart confirmed with patient.     Telephone follow up appointment with care management team member scheduled for: 10/19/23  Verna Czech, LCSW [place]  [facility], [facility] Licensed Clinical Social Worker Care Coordinator  Direct Dial: [phone]

## 2023-10-05 NOTE — Patient Outreach (Addendum)
  Care Coordination   Follow Up Visit Note   10/05/2023 Name: Tammy Acosta MRN: [id number] DOB: 1956/05/22  Tammy Acosta is a 68 y.o. year old female who sees Birdie Sons, Yehuda Mao, MD for primary care. I spoke with  Tammy Acosta by phone today.  What matters to the patients health and wellness today?  Affordable housing. Patient actively searching for affordable housing. Currently patient's has been given 30 day notice to move. Resources mailed to patient received, however affordability remains a challenge.   Goals Addressed             This Visit's Progress    Housing resources       Activities and task to complete in order to accomplish goals.   Follow up with housing resources sent  by mail  Please continue to follow up with any friends and/or family that may be able to assist with housing search as well. [employer] referral placed for additional housing resources         SDOH assessments and interventions completed:  No     Care Coordination Interventions:  Yes, provided  Interventions Today    Flowsheet Row Most Recent Value  Chronic Disease   Chronic disease during today's visit Diabetes, Hypertension (HTN)  General Interventions   General Interventions Discussed/Reviewed General Interventions Reviewed, [facility]  [Follow up regarding status of housing search. Pt confirms receiving housing resources mailed-continues to have difficutly locating affordable housing-confirms plan to discuss challenges with family]  Education Interventions   Education Provided Provided Education  Provided Verbal Education On [facility]  [discussed referral to the [place] 360 platform for additional housing resources. Shelter resources also discussed]  Mental Health Interventions   Mental Health Discussed/Reviewed Mental Health Reviewed  [Housing challenges acknowledged, emotional support provided-encouraged discussion with family for additional time to  identify affordable housing]       Follow up plan: Follow up call scheduled for 10/19/23    Encounter Outcome:  Patient Visit Completed

## 2023-10-11 ENCOUNTER — Encounter: Payer: Medicare Other | Admitting: *Deleted

## 2023-10-19 ENCOUNTER — Telehealth: Payer: Self-pay | Admitting: *Deleted

## 2023-10-19 ENCOUNTER — Encounter: Payer: Self-pay | Admitting: *Deleted

## 2023-10-19 NOTE — Patient Outreach (Signed)
 Care Coordination   10/19/2023 Name: Tammy Acosta MRN: [id number] DOB: 1955-11-21   Care Coordination Outreach Attempts:  An unsuccessful telephone outreach was attempted today to offer the patient information about available complex care management services.  Follow Up Plan:  Additional outreach attempts will be made to offer the patient complex care management information and services.   Encounter Outcome:  No Answer   Care Coordination Interventions:  No, not indicated    Ellawyn Wogan, LCSW [place]  [facility], [facility] Licensed Clinical Social Worker Care Coordinator  Direct Dial: [phone]

## 2023-10-26 ENCOUNTER — Other Ambulatory Visit: Payer: Self-pay | Admitting: Family Medicine

## 2023-10-26 DIAGNOSIS — E785 Hyperlipidemia, unspecified: Secondary | ICD-10-CM

## 2023-10-26 DIAGNOSIS — I10 Essential (primary) hypertension: Secondary | ICD-10-CM

## 2023-10-26 DIAGNOSIS — E1142 Type 2 diabetes mellitus with diabetic polyneuropathy: Secondary | ICD-10-CM

## 2023-10-26 DIAGNOSIS — M792 Neuralgia and neuritis, unspecified: Secondary | ICD-10-CM

## 2023-10-26 DIAGNOSIS — B0222 Postherpetic trigeminal neuralgia: Secondary | ICD-10-CM

## 2023-10-26 DIAGNOSIS — F419 Anxiety disorder, unspecified: Secondary | ICD-10-CM

## 2023-10-26 MED ORDER — ONETOUCH ULTRA VI STRP: ORAL_STRIP | 0 refills | Status: AC

## 2023-10-26 MED ORDER — SERTRALINE HCL 100 MG PO TABS: 200.0000 mg | ORAL_TABLET | Freq: Every day | ORAL | 0 refills | Status: DC

## 2023-10-26 MED ORDER — AMLODIPINE BESYLATE 5 MG PO TABS: 5.0000 mg | ORAL_TABLET | Freq: Every day | ORAL | 1 refills | Status: DC

## 2023-10-26 MED ORDER — ONETOUCH DELICA PLUS LANCET33G MISC: 0 refills | Status: AC

## 2023-10-26 MED ORDER — ROSUVASTATIN CALCIUM 40 MG PO TABS: 40.0000 mg | ORAL_TABLET | Freq: Every day | ORAL | 0 refills | Status: DC

## 2023-10-26 MED ORDER — METFORMIN HCL 1000 MG PO TABS: ORAL_TABLET | ORAL | 0 refills | Status: DC

## 2023-10-26 MED ORDER — FREESTYLE LIBRE 3 SENSOR MISC
0 refills | Status: DC
Start: 2023-10-26 — End: 2024-01-20

## 2023-10-26 MED ORDER — LISINOPRIL 40 MG PO TABS
40.0000 mg | ORAL_TABLET | Freq: Every day | ORAL | 0 refills | Status: DC
Start: 2023-10-26 — End: 2023-11-25

## 2023-10-26 MED ORDER — FREESTYLE LIBRE 3 READER DEVI
1.0000 | Freq: Every day | 0 refills | Status: AC
Start: 2023-10-26 — End: ?

## 2023-10-26 MED ORDER — GABAPENTIN 800 MG PO TABS: 800.0000 mg | ORAL_TABLET | Freq: Four times a day (QID) | ORAL | 0 refills | Status: DC

## 2023-10-26 NOTE — Telephone Encounter (Signed)
 Copied from CRM [id number]. Topic: Clinical - Medication Refill >> Oct 26, 2023 11:26 AM Denese Killings wrote: Most Recent Primary Care Visit:  Provider: Birdie Sons, ERIC G  Department: [facility]  Visit Type: OFFICE VISIT  Date: 09/08/2023  Medication: gabapentin (NEURONTIN) 800 MG tablet,metFORMIN (GLUCOPHAGE) 1000 MG tablet,Lancets (ONETOUCH DELICA PLUS LANCET33G) MISC,sertraline (ZOLOFT) 100 MG tablet [[id number]],OZHYQMVHQI Glucose Receiver (FREESTYLE LIBRE 3 READER) DEVI [[id number]],[id number] Glucose Receiver (FREESTYLE LIBRE 3 READER) DEVI [[id number]],[id number] (CRESTOR) 40 MG tablet [[id number]], lisinopril (ZESTRIL) 40 MG tablet [[id number]]  Has the patient contacted their pharmacy? No (Agent: If no, request that the patient contact the pharmacy for the refill. If patient does not wish to contact the pharmacy document the reason why and proceed with request.) (Agent: If yes, when and what did the pharmacy advise?) using different pharmacy   Is this the correct pharmacy for this prescription? Yes If no, delete pharmacy and type the correct one.  This is the patient's preferred pharmacy:  [facility] [address] [place], [place]  Has the prescription been filled recently? No  Is the patient out of the medication? Yes  Has the patient been seen for an appointment in the last year OR does the patient have an upcoming appointment? Yes  Can we respond through MyChart? No  Agent: Please be advised that Rx refills may take up to 3 business days. We ask that you follow-up with your pharmacy.

## 2023-10-26 NOTE — Telephone Encounter (Signed)
 Last Fill: Gabapentin: 01/26/23     Metformin: 09/08/23 180 tabs/1 RF     Lancets: 10/22/21     Libre Receiver: 06/23/23      Rosuvastatin: 03/12/23     Sertraline: 09/08/23 180 tabs/1 RF     Lisinopril: 02/26/23     Test Strips: 10/22/21     Libre 3 Sensor: 06/23/23     Amlodipine: 09/08/23 90 tabs/1 RF  Last OV: 09/08/23 Next OV: 12/07/23  Routing to provider for review/authorization.

## 2023-11-08 ENCOUNTER — Ambulatory Visit: Admitting: Family Medicine

## 2023-11-25 ENCOUNTER — Other Ambulatory Visit: Payer: Self-pay | Admitting: Internal Medicine

## 2023-11-25 DIAGNOSIS — E785 Hyperlipidemia, unspecified: Secondary | ICD-10-CM

## 2023-11-25 DIAGNOSIS — I10 Essential (primary) hypertension: Secondary | ICD-10-CM

## 2023-11-29 ENCOUNTER — Other Ambulatory Visit: Payer: Self-pay | Admitting: Nurse Practitioner

## 2023-11-29 DIAGNOSIS — B0222 Postherpetic trigeminal neuralgia: Secondary | ICD-10-CM

## 2023-11-29 DIAGNOSIS — M792 Neuralgia and neuritis, unspecified: Secondary | ICD-10-CM

## 2023-11-29 MED ORDER — GABAPENTIN 800 MG PO TABS: 800.0000 mg | ORAL_TABLET | Freq: Four times a day (QID) | ORAL | 0 refills | Status: DC

## 2023-11-29 NOTE — Telephone Encounter (Signed)
 Copied from CRM [id number]. Topic: Clinical - Medication Refill >> Nov 29, 2023  8:54 AM Florestine Avers wrote: Most Recent Primary Care Visit:  Provider: Birdie Sons ERIC G  Department: [facility]  Visit Type: OFFICE VISIT  Date: 09/08/2023  Medication: gabapentin (NEURONTIN) 800 MG tablet  Has the patient contacted their pharmacy? Yes (Agent: If no, request that the patient contact the pharmacy for the refill. If patient does not wish to contact the pharmacy document the reason why and proceed with request.) (Agent: If yes, when and what did the pharmacy advise?)  Is this the correct pharmacy for this prescription? Yes If no, delete pharmacy and type the correct one.  This is the patient's preferred pharmacy:  [id number] - [place], [place] - [address] AT [facility] [address] [place] [address] Phone: [phone] Fax: [phone]   Has the prescription been filled recently? No  Is the patient out of the medication? Yes  Has the patient been seen for an appointment in the last year OR does the patient have an upcoming appointment? Yes  Can we respond through MyChart? Yes  Agent: Please be advised that Rx refills may take up to 3 business days. We ask that you follow-up with your pharmacy.

## 2023-12-07 ENCOUNTER — Ambulatory Visit: Payer: Medicare Other | Admitting: Nurse Practitioner

## 2023-12-21 ENCOUNTER — Other Ambulatory Visit: Payer: Self-pay | Admitting: Nurse Practitioner

## 2023-12-21 DIAGNOSIS — B0222 Postherpetic trigeminal neuralgia: Secondary | ICD-10-CM

## 2023-12-21 DIAGNOSIS — M792 Neuralgia and neuritis, unspecified: Secondary | ICD-10-CM

## 2024-01-20 ENCOUNTER — Telehealth: Payer: Self-pay

## 2024-01-20 DIAGNOSIS — E785 Hyperlipidemia, unspecified: Secondary | ICD-10-CM

## 2024-01-20 DIAGNOSIS — I10 Essential (primary) hypertension: Secondary | ICD-10-CM

## 2024-01-20 DIAGNOSIS — E1142 Type 2 diabetes mellitus with diabetic polyneuropathy: Secondary | ICD-10-CM

## 2024-01-20 MED ORDER — LISINOPRIL 40 MG PO TABS: ORAL_TABLET | ORAL | 0 refills | Status: DC

## 2024-01-20 MED ORDER — FREESTYLE LIBRE 3 SENSOR MISC: 0 refills | Status: AC

## 2024-01-20 MED ORDER — ROSUVASTATIN CALCIUM 40 MG PO TABS: ORAL_TABLET | ORAL | 0 refills | Status: AC

## 2024-01-20 NOTE — Telephone Encounter (Signed)
 Spoke with pt to verify that she was switching to [facility] and she stated that she is. I have sent medications to that pharmacy.

## 2024-01-20 NOTE — Telephone Encounter (Signed)
 Copied from CRM [id number]. Topic: Clinical - Prescription Issue >> Jan 20, 2024  9:58 AM Magdalene School wrote: Reason for CRM: Ms. Tammy Acosta calling from [facility] stating that patient is requesting for prescriptions listed below to be transferred to their pharmacy. She stated that they faxed request on 01/13/24 and again on 01/18/24.  rosuvastatin  (CRESTOR ) 40 MG tablet lisinopril  (ZESTRIL ) 40 MG tablet Continuous Glucose Sensor (FREESTYLE LIBRE 3 SENSOR) MISC  Fax: [phone] Call back number: [phone]

## 2024-01-20 NOTE — Addendum Note (Signed)
 Addended by: Chadwick Colonel on: 01/20/2024 03:16 PM   Modules accepted: Orders

## 2024-01-26 ENCOUNTER — Other Ambulatory Visit: Payer: Self-pay | Admitting: Internal Medicine

## 2024-01-26 DIAGNOSIS — I10 Essential (primary) hypertension: Secondary | ICD-10-CM

## 2024-02-02 ENCOUNTER — Telehealth: Payer: Self-pay

## 2024-02-02 DIAGNOSIS — I10 Essential (primary) hypertension: Secondary | ICD-10-CM

## 2024-02-02 MED ORDER — AMLODIPINE BESYLATE 5 MG PO TABS: 5.0000 mg | ORAL_TABLET | Freq: Every day | ORAL | 0 refills | Status: DC

## 2024-02-02 MED ORDER — EZETIMIBE 10 MG PO TABS: ORAL_TABLET | ORAL | 0 refills | Status: DC

## 2024-02-02 NOTE — Telephone Encounter (Signed)
 Pt needs toc scheduled.  received refill request for amlodpine and Zetia . 30 day supply was sent.

## 2024-02-03 ENCOUNTER — Encounter: Admitting: Nurse Practitioner

## 2024-03-13 ENCOUNTER — Ambulatory Visit (INDEPENDENT_AMBULATORY_CARE_PROVIDER_SITE_OTHER): Payer: Medicare Other | Admitting: *Deleted

## 2024-03-13 VITALS — Ht 63.0 in | Wt 123.0 lb

## 2024-03-13 DIAGNOSIS — Z0000 Encounter for general adult medical examination without abnormal findings: Secondary | ICD-10-CM | POA: Diagnosis not present

## 2024-03-13 NOTE — Progress Notes (Signed)
 Subjective:   Tammy Acosta is a 68 y.o. who presents for a Medicare Wellness preventive visit.  As a reminder, Annual Wellness Visits don't include a physical exam, and some assessments may be limited, especially if this visit is performed virtually. We may recommend an in-person follow-up visit with your provider if needed.  Visit Complete: Virtual I connected with  Tammy Acosta on 03/13/24 by a audio enabled telemedicine application and verified that I am speaking with the correct person using two identifiers.  Patient Location: Home  Provider Location: Home Office  I discussed the limitations of evaluation and management by telemedicine. The patient expressed understanding and agreed to proceed.  Vital Signs: Because this visit was a virtual/telehealth visit, some criteria may be missing or patient reported. Any vitals not documented were not able to be obtained and vitals that have been documented are patient reported.  VideoDeclined- This patient declined Librarian, academic. Therefore the visit was completed with audio only.  Persons Participating in Visit: Patient.  AWV Questionnaire: No: Patient Medicare AWV questionnaire was not completed prior to this visit.  Cardiac Risk Factors include: advanced age (>61men, >62 women);diabetes mellitus;hypertension;dyslipidemia;smoking/ tobacco exposure     Objective:    Today's Vitals   03/13/24 1425  Weight: 123 lb (55.8 kg)  Height: 5' 3 (1.6 m)   Body mass index is 21.79 kg/m.     03/13/2024    2:38 PM 03/10/2023    2:43 PM 01/28/2022   10:40 AM 11/05/2020    1:12 PM 11/03/2019    1:15 PM 11/02/2018   10:04 AM 11/06/2016    4:12 PM  Advanced Directives  Does Patient Have a Medical Advance Directive? No No No No No No  No   Does patient want to make changes to medical advance directive?      No - Patient declined    Would patient like information on creating a medical advance directive?  No - Patient declined  No - Patient declined No - Patient declined No - Patient declined  Yes (MAU/Ambulatory/Procedural Areas - Information given)      Data saved with a previous flowsheet row definition    Current Medications (verified) Outpatient Encounter Medications as of 03/13/2024  Medication Sig   amLODipine  (NORVASC ) 5 MG tablet Take 1 tablet (5 mg total) by mouth daily. TAKE 1 TABLET(5 MG) BY MOUTH DAILY   blood glucose meter kit and supplies Dispense One touch meter.  E11.9   Blood Glucose Monitoring Suppl (ONE TOUCH ULTRA 2) w/Device KIT Dispense 1 meter to use to test blood glucose once daily. Dx code: E11.42.   Continuous Glucose Receiver (FREESTYLE LIBRE 3 READER) DEVI 1 each by Does not apply route daily. Use to check glucose continuously.   Continuous Glucose Sensor (FREESTYLE LIBRE 3 SENSOR) MISC Place 1 sensor on the skin every 14 days. Use to check glucose continuously   ezetimibe  (ZETIA ) 10 MG tablet TAKE 1 TABLET(10 MG) BY MOUTH DAILY   gabapentin  (NEURONTIN ) 800 MG tablet TAKE 1 TABLET(800 MG) BY MOUTH FOUR TIMES DAILY   glucose blood (ONETOUCH ULTRA) test strip USE 1 STRIP TO CHECK GLUCOSE UP TO THREE TIMES DAILY AS DIRECTED.   insulin  glargine (LANTUS  SOLOSTAR) 100 UNIT/ML Solostar Pen Inject 65 Units into the skin daily. (Patient taking differently: Inject 59 Units into the skin daily.)   Insulin  Pen Needle (PEN NEEDLES) 32G X 5 MM MISC 1 pen by Does not apply route daily. Use to inject  insulin  daily   Lancets (ONETOUCH DELICA PLUS LANCET33G) MISC USE   TO CHECK GLUCOSE UP TO THREE TIMES DAILY AS DIRECTED   lisinopril  (ZESTRIL ) 40 MG tablet TAKE 1 TABLET(40 MG) BY MOUTH DAILY   metFORMIN  (GLUCOPHAGE ) 1000 MG tablet TAKE 1 TABLET(1000 MG) BY MOUTH TWICE DAILY WITH A MEAL   rosuvastatin  (CRESTOR ) 40 MG tablet TAKE 1 TABLET(40 MG) BY MOUTH DAILY   sertraline  (ZOLOFT ) 100 MG tablet Take 2 tablets (200 mg total) by mouth daily.   tiZANidine  (ZANAFLEX ) 4 MG tablet TAKE 1  TABLET BY MOUTH EVERY 8 HOURS AS NEEDED FOR MUSCLE SPASM   No facility-administered encounter medications on file as of 03/13/2024.    Allergies (verified) Penicillins, Meloxicam , and Tetracyclines & related   History: Past Medical History:  Diagnosis Date   Altered mental status 02/04/2014   Arthritis    Colon polyps    Depression    Diabetes mellitus without complication (HCC)    Pt states she takes Insulin  and metformin .   Hypercholesteremia    Hyperlipidemia    Hypertension    Shingles    10/17   Spine disorder    Trigeminal herpes zoster (left V1 distribution) 06/24/2016   Viral upper respiratory illness 12/03/2015   Past Surgical History:  Procedure Laterality Date   ABDOMINAL HYSTERECTOMY     BACK SURGERY     CHOLECYSTECTOMY     KYPHOPLASTY     Family History  Problem Relation Age of Onset   Heart disease Mother    Hypertension Mother    Cancer Mother        breast   Breast cancer Mother        early 79's   Heart disease Father    Arthritis Other        Parent, other relative   Colon cancer Other        Parent   Lung cancer Other        Other relative   Diabetes Other        Other relative   Uterine cancer Other        Other relative   Cancer Maternal Aunt        breast   Breast cancer Maternal Aunt    Cancer Maternal Aunt        ovarian   Breast cancer Cousin    Breast cancer Cousin    Breast cancer Cousin    Social History   Socioeconomic History   Marital status: Divorced    Spouse name: Not on file   Number of children: Not on file   Years of education: Not on file   Highest education level: Associate degree: occupational, Scientist, product/process development, or vocational program  Occupational History   Not on file  Tobacco Use   Smoking status: Every Day    Current packs/day: 0.00    Types: Cigarettes   Smokeless tobacco: Never  Substance and Sexual Activity   Alcohol use: No   Drug use: No   Sexual activity: Never  Other Topics Concern   Not on file   Social History Narrative   Not on file   Social Drivers of Health   Financial Resource Strain: Low Risk  (03/13/2024)   Overall Financial Resource Strain (CARDIA)    Difficulty of Paying Living Expenses: Not hard at all  Food Insecurity: No Food Insecurity (03/13/2024)   Hunger Vital Sign    Worried About Running Out of Food in the Last Year: Never true  Ran Out of Food in the Last Year: Never true  Transportation Needs: No Transportation Needs (03/13/2024)   PRAPARE - Administrator, Civil Service (Medical): No    Lack of Transportation (Non-Medical): No  Physical Activity: Insufficiently Active (03/13/2024)   Exercise Vital Sign    Days of Exercise per Week: 3 days    Minutes of Exercise per Session: 20 min  Stress: No Stress Concern Present (03/13/2024)   [name] of Occupational Health - Occupational Stress Questionnaire    Feeling of Stress: Not at all  Social Connections: Socially Isolated (03/13/2024)   Social Connection and Isolation Panel    Frequency of Communication with Friends and Family: Twice a week    Frequency of Social Gatherings with Friends and Family: Once a week    Attends Religious Services: Never    Database administrator or Organizations: No    Attends Engineer, structural: Never    Marital Status: Divorced    Tobacco Counseling Ready to quit: No Counseling given: Not Answered    Clinical Intake:  Pre-visit preparation completed: Yes  Pain : No/denies pain     BMI - recorded: 21.79 Nutritional Status: BMI of 19-24  Normal Nutritional Risks: None Diabetes: Yes CBG done?: Yes (FBS 101 per paitent) CBG resulted in Enter/ Edit results?: No Did pt. bring in CBG monitor from home?: No  Lab Results  Component Value Date   HGBA1C 16.4 Repeated and verified X2. (H) 06/23/2023   HGBA1C 15.0 (A) 06/23/2023   HGBA1C CANCELED 05/07/2023     How often do you need to have someone help you when you read instructions,  pamphlets, or other written materials from your doctor or pharmacy?: 1 - Never  Interpreter Needed?: No  Information entered by :: R. Tanish Sinkler LPN   Activities of Daily Living     03/13/2024    2:27 PM  In your present state of health, do you have any difficulty performing the following activities:  Hearing? 0  Vision? 0  Comment glasses  Difficulty concentrating or making decisions? 1  Walking or climbing stairs? 1  Dressing or bathing? 0  Doing errands, shopping? 0  Preparing Food and eating ? N  Using the Toilet? N  In the past six months, have you accidently leaked urine? N  Do you have problems with loss of bowel control? N  Managing your Medications? N  Managing your Finances? N  Housekeeping or managing your Housekeeping? N    Patient Care Team: Ermalinda Lenn CHRISTELLA KEN as [facility] [place], [place], [place]  I have updated your Care Teams any recent Medical Services you may have received from other providers in the past year.     Assessment:   This is a routine wellness examination for Tammy Acosta.  Hearing/Vision screen Hearing Screening - Comments:: No issues Vision Screening - Comments:: glasses   Goals Addressed             This Visit's Progress    Patient Stated       Wants to some time in the future quit smoking       Depression Screen     03/13/2024    2:33 PM 09/20/2023   10:19 AM 09/08/2023    1:43 PM 06/23/2023   11:48 AM 05/07/2023    1:43 PM 03/10/2023    2:36 PM 05/29/2022   12:08 PM  PHQ 2/9 Scores  PHQ - 2 Score 0 1 3 0 1  0 0  PHQ- 9 Score 3 5 9  0 8 1     Fall Risk     03/13/2024    2:29 PM 09/20/2023   10:06 AM 09/08/2023    1:43 PM 06/23/2023   11:48 AM 05/07/2023    1:42 PM  Fall Risk   Falls in the past year? 1 1 1 1  0  Number falls in past yr: 1 0 0 1 0  Injury with Fall? 1 0 0 1 0  Risk for fall due to : History of fall(s);Impaired balance/gait History of fall(s) History of fall(s) History of fall(s) No Fall Risks   Follow up Falls evaluation completed;Falls prevention discussed  Falls evaluation completed;Falls prevention discussed Falls evaluation completed Falls evaluation completed    MEDICARE RISK AT HOME:  Medicare Risk at Home Any stairs in or around the home?: No If so, are there any without handrails?: No Home free of loose throw rugs in walkways, pet beds, electrical cords, etc?: Yes Adequate lighting in your home to reduce risk of falls?: Yes Life alert?: No Use of a cane, walker or w/c?: Yes (cane) Grab bars in the bathroom?: Yes Shower chair or bench in shower?: Yes Elevated toilet seat or a handicapped toilet?: No  TIMED UP AND GO:  Was the test performed?  No  Cognitive Function: 6CIT completed    11/06/2016    4:47 PM  MMSE - Mini Mental State Exam  Orientation to time 5   Orientation to Place 5   Registration 3   Attention/ Calculation 5   Recall 3   Language- name 2 objects 2   Language- repeat 1  Language- follow 3 step command 3   Language- read & follow direction 1   Write a sentence 1   Copy design 1   Total score 30      Data saved with a previous flowsheet row definition        03/13/2024    2:38 PM 03/10/2023    2:43 PM 01/28/2022   10:48 AM 11/03/2019    1:32 PM 11/02/2018   10:05 AM  6CIT Screen  What Year? 0 points 0 points 0 points 0 points 0 points  What month? 0 points 0 points 0 points 0 points 0 points  What time? 0 points 0 points 0 points 0 points 0 points  Count back from 20 0 points 0 points 0 points 0 points 0 points  Months in reverse 0 points 2 points 2 points 0 points 0 points  Repeat phrase 4 points 10 points 0 points 0 points 0 points  Total Score 4 points 12 points 2 points 0 points 0 points    Immunizations Immunization History  Administered Date(s) Administered   Influenza,inj,Quad PF,6+ Mos 10/30/2015, 07/01/2017, 07/15/2018   Pneumococcal Polysaccharide-23 10/30/2015   Tdap 02/03/2014    Screening Tests Health  Maintenance  Topic Date Due   Diabetic kidney evaluation - Urine ACR  Never done   Zoster Vaccines- Shingrix (1 of 2) Never done   Colonoscopy  12/12/2012   Pneumococcal Vaccine: 50+ Years (2 of 2 - PCV) 10/29/2016   FOOT EXAM  10/15/2018   MAMMOGRAM  01/26/2020   OPHTHALMOLOGY EXAM  12/30/2022   HEMOGLOBIN A1C  12/22/2023   DTaP/Tdap/Td (2 - Td or Tdap) 02/04/2024   Medicare Annual Wellness (AWV)  03/09/2024   INFLUENZA VACCINE  03/24/2024   Diabetic kidney evaluation - eGFR measurement  06/22/2024   DEXA SCAN  Completed  Hepatitis C Screening  Completed   Hepatitis B Vaccines  Aged Out   HPV VACCINES  Aged Out   Meningococcal B Vaccine  Aged Out   COVID-19 Vaccine  Discontinued    Health Maintenance  Health Maintenance Due  Topic Date Due   Diabetic kidney evaluation - Urine ACR  Never done   Zoster Vaccines- Shingrix (1 of 2) Never done   Colonoscopy  12/12/2012   Pneumococcal Vaccine: 50+ Years (2 of 2 - PCV) 10/29/2016   FOOT EXAM  10/15/2018   MAMMOGRAM  01/26/2020   OPHTHALMOLOGY EXAM  12/30/2022   HEMOGLOBIN A1C  12/22/2023   DTaP/Tdap/Td (2 - Td or Tdap) 02/04/2024   Medicare Annual Wellness (AWV)  03/09/2024   Health Maintenance Items Addressed: Discussed the need to update vaccines. Patent declines vaccines and colonoscopy. Patient was reminded to call and schedule her mammogram that was ordered 05/07/23 telephone number provided. Needs to discuss with PCP lung cancer screening.  Patient smokes and is not interested in quitting. Patient needs a diabetic foot exam and A1C at upcoming visit.  Additional Screening:  Vision Screening: Recommended annual ophthalmology exams for early detection of glaucoma and other disorders of the eye. Overdue [facility]  Patient was advised that she needs to call and schedule a diabetic eye exam.  Would you like a referral to an eye doctor? No    Dental Screening: Recommended annual dental exams for proper oral  hygiene  Community Resource Referral / Chronic Care Management: CRR required this visit?  No   CCM required this visit?  No   Plan:    I have personally reviewed and noted the following in the patient's chart:   Medical and social history Use of alcohol, tobacco or illicit drugs  Current medications and supplements including opioid prescriptions. Patient is not currently taking opioid prescriptions. Functional ability and status Nutritional status Physical activity Advanced directives List of other physicians Hospitalizations, surgeries, and ER visits in previous 12 months Vitals Screenings to include cognitive, depression, and falls Referrals and appointments  In addition, I have reviewed and discussed with patient certain preventive protocols, quality metrics, and best practice recommendations. A written personalized care plan for preventive services as well as general preventive health recommendations were provided to patient.   Angeline Fredericks, LPN   2/78/7974   After Visit Summary: (MyChart) Due to this being a telephonic visit, the after visit summary with patients personalized plan was offered to patient via MyChart   Notes: Nothing significant to report at this time.

## 2024-03-13 NOTE — Patient Instructions (Signed)
 Ms. Tammy Acosta , Thank you for taking time out of your busy schedule to complete your Annual Wellness Visit with me. I enjoyed our conversation and look forward to speaking with you again next year. I, as well as your care team,  appreciate your ongoing commitment to your health goals. Please review the following plan we discussed and let me know if I can assist you in the future. Your Game plan/ To Do List    Referrals: If you haven't heard from the office you've been referred to, please reach out to them at the phone provided.  Consider updating your vaccines. Call and schedule your mammogram that was ordered 05/07/23.  Remember to call and schedule a diabetic eye exam.  Follow up Visits: Next Medicare AWV with our clinical staff: 03/19/25 @ 1:00   Have you seen your provider in the last 6 months (3 months if uncontrolled diabetes)? No Next Office Visit with your provider: 05/24/24  Clinician Recommendations:  Aim for 30 minutes of exercise or brisk walking, 6-8 glasses of water, and 5 servings of fruits and vegetables each day.       This is a list of the screening recommended for you and due dates:  Health Maintenance  Topic Date Due   Yearly kidney health urinalysis for diabetes  Never done   Zoster (Shingles) Vaccine (1 of 2) Never done   Colon Cancer Screening  12/12/2012   Pneumococcal Vaccine for age over 1 (2 of 2 - PCV) 10/29/2016   Complete foot exam   10/15/2018   Mammogram  01/26/2020   Eye exam for diabetics  12/30/2022   Hemoglobin A1C  12/22/2023   DTaP/Tdap/Td vaccine (2 - Td or Tdap) 02/04/2024   Flu Shot  03/24/2024   Yearly kidney function blood test for diabetes  06/22/2024   Medicare Annual Wellness Visit  03/13/2025   DEXA scan (bone density measurement)  Completed   Hepatitis C Screening  Completed   Hepatitis B Vaccine  Aged Out   HPV Vaccine  Aged Out   Meningitis B Vaccine  Aged Out   COVID-19 Vaccine  Discontinued    Advanced directives: (ACP  Link)Information on Advanced Care Planning can be found at [employer] of [facility] Advance Health Care Directives Advance Health Care Directives. [email or web address]  Advance Care Planning is important because it:  [x]  Makes sure you receive the medical care that is consistent with your values, goals, and preferences  [x]  It provides guidance to your family and loved ones and reduces their decisional burden about whether or not they are making the right decisions based on your wishes.  Follow the link provided in your after visit summary or read over the paperwork we have mailed to you to help you started getting your Advance Directives in place. If you need assistance in completing these, please reach out to us  so that we can help you!

## 2024-04-02 ENCOUNTER — Other Ambulatory Visit: Payer: Self-pay

## 2024-04-02 ENCOUNTER — Emergency Department

## 2024-04-02 ENCOUNTER — Inpatient Hospital Stay
Admission: EM | Admit: 2024-04-02 | Discharge: 2024-04-05 | DRG: 638 | Disposition: A | Discharge status: Home / Self Care | Attending: Hospitalist | Admitting: Hospitalist

## 2024-04-02 DIAGNOSIS — E860 Dehydration: Secondary | ICD-10-CM | POA: Diagnosis present

## 2024-04-02 DIAGNOSIS — Z7984 Long term (current) use of oral hypoglycemic drugs: Secondary | ICD-10-CM

## 2024-04-02 DIAGNOSIS — F32A Depression, unspecified: Secondary | ICD-10-CM | POA: Diagnosis present

## 2024-04-02 DIAGNOSIS — F419 Anxiety disorder, unspecified: Secondary | ICD-10-CM | POA: Diagnosis present

## 2024-04-02 DIAGNOSIS — Z66 Do not resuscitate: Secondary | ICD-10-CM | POA: Diagnosis present

## 2024-04-02 DIAGNOSIS — E1110 Type 2 diabetes mellitus with ketoacidosis without coma: Secondary | ICD-10-CM | POA: Diagnosis not present

## 2024-04-02 DIAGNOSIS — F0394 Unspecified dementia, unspecified severity, with anxiety: Secondary | ICD-10-CM | POA: Diagnosis present

## 2024-04-02 DIAGNOSIS — Z794 Long term (current) use of insulin: Secondary | ICD-10-CM

## 2024-04-02 DIAGNOSIS — R296 Repeated falls: Secondary | ICD-10-CM | POA: Diagnosis present

## 2024-04-02 DIAGNOSIS — E1142 Type 2 diabetes mellitus with diabetic polyneuropathy: Secondary | ICD-10-CM | POA: Diagnosis present

## 2024-04-02 DIAGNOSIS — Z8249 Family history of ischemic heart disease and other diseases of the circulatory system: Secondary | ICD-10-CM

## 2024-04-02 DIAGNOSIS — R4182 Altered mental status, unspecified: Secondary | ICD-10-CM

## 2024-04-02 DIAGNOSIS — E785 Hyperlipidemia, unspecified: Secondary | ICD-10-CM

## 2024-04-02 DIAGNOSIS — E7800 Pure hypercholesterolemia, unspecified: Secondary | ICD-10-CM | POA: Diagnosis present

## 2024-04-02 DIAGNOSIS — Z833 Family history of diabetes mellitus: Secondary | ICD-10-CM

## 2024-04-02 DIAGNOSIS — I10 Essential (primary) hypertension: Secondary | ICD-10-CM | POA: Diagnosis present

## 2024-04-02 DIAGNOSIS — Z9181 History of falling: Secondary | ICD-10-CM

## 2024-04-02 DIAGNOSIS — N179 Acute kidney failure, unspecified: Secondary | ICD-10-CM | POA: Diagnosis present

## 2024-04-02 DIAGNOSIS — Z79899 Other long term (current) drug therapy: Secondary | ICD-10-CM

## 2024-04-02 DIAGNOSIS — F17210 Nicotine dependence, cigarettes, uncomplicated: Secondary | ICD-10-CM | POA: Diagnosis present

## 2024-04-02 LAB — BLOOD GAS, VENOUS
Acid-base deficit: 12.8 mmol/L — ABNORMAL HIGH (ref 0.0–2.0)
Bicarbonate: 13.9 mmol/L — ABNORMAL LOW (ref 20.0–28.0)
O2 Saturation: 55.1 %
Patient temperature: 37
pCO2, Ven: 34 mmHg — ABNORMAL LOW (ref 44–60)
pH, Ven: 7.22 — ABNORMAL LOW (ref 7.25–7.43)
pO2, Ven: 32 mmHg (ref 32–45)

## 2024-04-02 LAB — CBC WITH DIFFERENTIAL/PLATELET
Abs Immature Granulocytes: 0.06 K/uL (ref 0.00–0.07)
Basophils Absolute: 0.1 K/uL (ref 0.0–0.1)
Basophils Relative: 1 %
Eosinophils Absolute: 0.2 K/uL (ref 0.0–0.5)
Eosinophils Relative: 2 %
HCT: 47.2 % — ABNORMAL HIGH (ref 36.0–46.0)
Hemoglobin: 15.7 g/dL — ABNORMAL HIGH (ref 12.0–15.0)
Immature Granulocytes: 1 %
Lymphocytes Relative: 26 %
Lymphs Abs: 2.2 K/uL (ref 0.7–4.0)
MCH: 29 pg (ref 26.0–34.0)
MCHC: 33.3 g/dL (ref 30.0–36.0)
MCV: 87.2 fL (ref 80.0–100.0)
Monocytes Absolute: 0.4 K/uL (ref 0.1–1.0)
Monocytes Relative: 5 %
Neutro Abs: 5.8 K/uL (ref 1.7–7.7)
Neutrophils Relative %: 65 %
Platelets: 247 K/uL (ref 150–400)
RBC: 5.41 MIL/uL — ABNORMAL HIGH (ref 3.87–5.11)
RDW: 14.3 % (ref 11.5–15.5)
WBC: 8.8 K/uL (ref 4.0–10.5)
nRBC: 0 % (ref 0.0–0.2)

## 2024-04-02 LAB — COMPREHENSIVE METABOLIC PANEL WITH GFR
ALT: 19 U/L (ref 0–44)
AST: 20 U/L (ref 15–41)
Albumin: 3.7 g/dL (ref 3.5–5.0)
Alkaline Phosphatase: 77 U/L (ref 38–126)
Anion gap: 21 — ABNORMAL HIGH (ref 5–15)
BUN: 30 mg/dL — ABNORMAL HIGH (ref 8–23)
CO2: 11 mmol/L — ABNORMAL LOW (ref 22–32)
Calcium: 9.1 mg/dL (ref 8.9–10.3)
Chloride: 101 mmol/L (ref 98–111)
Creatinine, Ser: 1.26 mg/dL — ABNORMAL HIGH (ref 0.44–1.00)
GFR, Estimated: 46 mL/min — ABNORMAL LOW (ref >60)
Glucose, Bld: 398 mg/dL — ABNORMAL HIGH (ref 70–99)
Potassium: 3.9 mmol/L (ref 3.5–5.1)
Sodium: 133 mmol/L — ABNORMAL LOW (ref 135–145)
Total Bilirubin: 1.6 mg/dL — ABNORMAL HIGH (ref 0.0–1.2)
Total Protein: 7.7 g/dL (ref 6.5–8.1)

## 2024-04-02 LAB — URINALYSIS, W/ REFLEX TO CULTURE (INFECTION SUSPECTED)
Bacteria, UA: NONE SEEN
Bilirubin Urine: NEGATIVE
Glucose, UA: >=500 mg/dL — AB
Hgb urine dipstick: NEGATIVE
Ketones, ur: 80 mg/dL — AB
Leukocytes,Ua: NEGATIVE
Nitrite: NEGATIVE
Protein, ur: 30 mg/dL — AB
Specific Gravity, Urine: 1.028 (ref 1.005–1.030)
pH: 5 (ref 5.0–8.0)

## 2024-04-02 LAB — AMMONIA: Ammonia: 17 umol/L (ref 9–35)

## 2024-04-02 MED ORDER — LACTATED RINGERS IV BOLUS
1000.0000 mL | Freq: Once | INTRAVENOUS | Status: AC
  Administered 2024-04-02: 1000 mL via INTRAVENOUS

## 2024-04-02 NOTE — ED Provider Notes (Signed)
 Tammy Acosta Provider Note    Event Date/Time   First MD Initiated Contact with Patient 04/02/24 2159     (approximate)   History   Altered Mental Status   HPI  Tammy Acosta is a 68 y.o. female with history of diabetes, hyperlipidemia, hypertension, presenting with altered mental status.  Per independent history from EMS, daughter was checking in on her over the weekend, noted that she was altered, not taking her medications, refusing to get out of bed, refusing to put her clothes, refusing to eat, EMS states that she is typically ANO x 4 and takes care of herself at home alone.  Patient has no complaints.  No known recent trauma or falls.  Unknown when she was last completely herself.  Independent history obtained from EMS as above.  On independent review, she was last seen by primary care in January of this year, has history of frequent falls.  Her blood sugar ranges high.  Is on metformin  as well as insulin .     Physical Exam   Triage Vital Signs: ED Triage Vitals [04/02/24 2159]  Encounter Vitals Group     BP      Girls Systolic BP Percentile      Girls Diastolic BP Percentile      Boys Systolic BP Percentile      Boys Diastolic BP Percentile      Pulse      Resp      Temp      Temp src      SpO2 99 %     Weight      Height      Head Circumference      Peak Flow      Pain Score      Pain Loc      Pain Education      Exclude from Growth Chart     Most recent vital signs: Vitals:   04/02/24 2203 04/02/24 2230  BP: 126/73 131/74  Pulse: 89 87  Resp: 20 20  Temp: 98 F (36.7 C)   SpO2: 100% 100%     General: Awake, no distress.  CV:  Good peripheral perfusion.  Resp:  Normal effort.  No tachypnea or respiratory distress Abd:  No distention.  Soft nontender Other:  No obvious sign of external trauma, she is calm, no cranial nerve deficits, pupils are equal and reactive, extraocular movements are intact, no focal weakness or  numbness.  No tenderness to extremities, thoracic cage or her back.   ED Results / Procedures / Treatments   Labs (all labs ordered are listed, but only abnormal results are displayed) Labs Reviewed  COMPREHENSIVE METABOLIC PANEL WITH GFR - Abnormal; Notable for the following components:      Result Value   Sodium 133 (*)    CO2 11 (*)    Glucose, Bld 398 (*)    BUN 30 (*)    Creatinine, Ser 1.26 (*)    Total Bilirubin 1.6 (*)    GFR, Estimated 46 (*)    Anion gap 21 (*)    All other components within normal limits  CBC WITH DIFFERENTIAL/PLATELET - Abnormal; Notable for the following components:   RBC 5.41 (*)    Hemoglobin 15.7 (*)    HCT 47.2 (*)    All other components within normal limits  AMMONIA  URINALYSIS, W/ REFLEX TO CULTURE (INFECTION SUSPECTED)  BETA-HYDROXYBUTYRIC ACID  BLOOD GAS, VENOUS     EKG  EKG shows, EKG shows sinus rhythm, rate 86, normal QRS, normal QTc, no obvious ischemic ST elevation, T wave flattening in 1, aVL, not secondary to compare to prior   RADIOLOGY On my independent interpretation, CT head without obvious intracranial hemorrhage   PROCEDURES:  Critical Care performed: No  Procedures   MEDICATIONS ORDERED IN ED: Medications  lactated ringers  bolus 1,000 mL (1,000 mLs Intravenous New Bag/Given 04/02/24 2344)     IMPRESSION / MDM / ASSESSMENT AND PLAN / ED COURSE  I reviewed the triage vital signs and the nursing notes.                              Differential diagnosis includes, but is not limited to, occult infection such as UTI or pneumonia, mass, ICH, consider CVA but she has no focal deficits at this time, electrolyte derangements, dehydration, hyperammonemia.  Will get labs, EKG, chest x-ray, UA, CT head.  IV fluids.  Patient's presentation is most consistent with acute presentation with potential threat to life or bodily function.  Independent interpretation of labs and imaging below.  Patient signed out pending rest  of labs and UA, she will need to be admitted for further management and workup of her altered mental status.  The patient is on the cardiac monitor to evaluate for evidence of arrhythmia and/or significant heart rate changes.   Clinical Course as of 04/02/24 2349  Sun Apr 02, 2024  2321 CT Head Wo Contrast No acute intracranial abnormality.  [TT]  2321 DG Chest 1 View IMPRESSION: 1. No active disease. 2. Displaced transverse fracture through the right humeral neck appears new from 2013 and is favored as chronic. Correlate clinically.   [TT]  2323 Independent review of labs, ammonia is not elevated, mild hyponatremia, glucose is elevated, she is acidotic with an anion gap.  Will add a VBG as well as beta hydroxybutyrate.  No leukocytosis. [TT]  2338 I independently viewed and interpreted the patient's head CT and I see no evidence of acute intracranial bleed or mass, nothing to explain the patient's altered mental status.  I also read the radiologist's report, which confirmed no acute findings. [CF]  2338 Ammonia: 17 [CF]  2338 Patient's metabolic panel is notable for hyperglycemia, acute kidney injury, and elevated anion gap at 21.  Beta hydroxybutyric acid pending.  Patient is getting a liter of fluids [CF]    Clinical Course User Index [CF] [name], MD [TT] [name], MD     FINAL CLINICAL IMPRESSION(S) / ED DIAGNOSES   Final diagnoses:  Altered mental status, unspecified altered mental status type  Dehydration     Rx / DC Orders   ED Discharge Orders     None        Note:  This document was prepared using Dragon voice recognition software and may include unintentional dictation errors.    [name], MD 04/02/24 [phone]

## 2024-04-02 NOTE — ED Notes (Signed)
 Pt returned from CT at this time.

## 2024-04-02 NOTE — ED Provider Notes (Signed)
 ----------------------------------------- 11:02 PM on 04/02/2024 -----------------------------------------  Assuming care from Dr. Waymond.  In short, Tammy Acosta is a 68 y.o. female with a chief complaint of AMS.  Refer to the original H&P for additional details.  The current plan of care is to follow up on labs and imaging and reassess.  Will need admission given encephalopathy.   Clinical Course as of 04/03/24 0034  [name] Apr 02, 2024  2321 CT Head Wo Contrast No acute intracranial abnormality.  [TT]  2321 DG Chest 1 View IMPRESSION: 1. No active disease. 2. Displaced transverse fracture through the right humeral neck appears new from 2013 and is favored as chronic. Correlate clinically.   [TT]  2323 Independent review of labs, ammonia is not elevated, mild hyponatremia, glucose is elevated, she is acidotic with an anion gap.  Will add a VBG as well as beta hydroxybutyrate.  No leukocytosis. [TT]  2338 I independently viewed and interpreted the patient's head CT and I see no evidence of acute intracranial bleed or mass, nothing to explain the patient's altered mental status.  I also read the radiologist's report, which confirmed no acute findings. [CF]  2338 Ammonia: 17 [CF]  2338 Patient's metabolic panel is notable for hyperglycemia, acute kidney injury, and elevated anion gap at 21.  Beta hydroxybutyric acid pending.  Patient is getting a liter of fluids [CF]  Mon Apr 03, 2024  0007 Urinalysis, w/ Reflex to Culture (Infection Suspected) -Urine, Clean Catch(!) Ketonuria and glucosuria consistent with DKA.  Beta hydroxybutyric acid is still pending but given her acidosis and the constellation of restless symptoms I am going to treat for DKA with IV insulin  per protocol.  I am consulting the hospitalist team for admission. [CF]  0033 I consulted by phone with the admitting hospitalist, and they will admit the patient - Dr. Lawence [CF]    Clinical Course User Index [CF] [name], MD [TT] [name], MD   .1-3 Lead EKG Interpretation  Performed by: [name], MD Authorized by: [name], MD     Interpretation: normal     ECG rate:  86   ECG rate assessment: normal     Rhythm: sinus rhythm     Ectopy: none     Conduction: normal   .Critical Care  Performed by: [name], MD Authorized by: [name], MD   Critical care provider statement:    Critical care time (minutes):  30   Critical care time was exclusive of:  Separately billable procedures and treating other patients   Critical care was necessary to treat or prevent imminent or life-threatening deterioration of the following conditions:  Metabolic crisis (DKA requiring IV insulin )   Critical care was time spent personally by me on the following activities:  Development of treatment plan with patient or surrogate, evaluation of patient's response to treatment, examination of patient, obtaining history from patient or surrogate, ordering and performing treatments and interventions, ordering and review of laboratory studies, ordering and review of radiographic studies, pulse oximetry, re-evaluation of patient's condition and review of old charts     Medications  insulin  regular, human (MYXREDLIN ) 100 units/ 100 mL infusion (has no administration in time range)  lactated ringers  infusion (has no administration in time range)  dextrose  5 % in lactated ringers  infusion (has no administration in time range)  dextrose  50 % solution 0-50 mL (has no administration in time range)  potassium chloride  10 mEq in 100 mL IVPB (has no administration in time  range)  lactated ringers  bolus 1,000 mL (1,000 mLs Intravenous New Bag/Given 04/02/24 2344)     ED Discharge Orders     None      Final diagnoses:  Altered mental status, unspecified altered mental status type  Dehydration  Diabetic ketoacidosis without coma associated with type 2 diabetes mellitus (HCC)  Acute kidney injury (HCC)      [name], MD 04/03/24 [id number]

## 2024-04-02 NOTE — ED Notes (Signed)
 Patient transported to CT

## 2024-04-02 NOTE — ED Triage Notes (Signed)
 Pt arrives from home via [facility].  Family C/O d/t AMS for past 2 days. Pt refusing to take medications, wear clothes at home per family. Pt lives alone and is AOx4 at baseline.

## 2024-04-02 NOTE — ED Notes (Signed)
 Pt attempting to provide urine sample at this time.  Family in room with pt.

## 2024-04-03 DIAGNOSIS — I10 Essential (primary) hypertension: Secondary | ICD-10-CM

## 2024-04-03 DIAGNOSIS — Z833 Family history of diabetes mellitus: Secondary | ICD-10-CM | POA: Diagnosis not present

## 2024-04-03 DIAGNOSIS — E1110 Type 2 diabetes mellitus with ketoacidosis without coma: Secondary | ICD-10-CM | POA: Diagnosis present

## 2024-04-03 DIAGNOSIS — E860 Dehydration: Secondary | ICD-10-CM | POA: Diagnosis present

## 2024-04-03 DIAGNOSIS — Z66 Do not resuscitate: Secondary | ICD-10-CM | POA: Diagnosis present

## 2024-04-03 DIAGNOSIS — R296 Repeated falls: Secondary | ICD-10-CM | POA: Diagnosis present

## 2024-04-03 DIAGNOSIS — Z794 Long term (current) use of insulin: Secondary | ICD-10-CM | POA: Diagnosis not present

## 2024-04-03 DIAGNOSIS — N179 Acute kidney failure, unspecified: Secondary | ICD-10-CM | POA: Diagnosis present

## 2024-04-03 DIAGNOSIS — F32A Depression, unspecified: Secondary | ICD-10-CM | POA: Diagnosis present

## 2024-04-03 DIAGNOSIS — F419 Anxiety disorder, unspecified: Secondary | ICD-10-CM

## 2024-04-03 DIAGNOSIS — E785 Hyperlipidemia, unspecified: Secondary | ICD-10-CM

## 2024-04-03 DIAGNOSIS — Z7984 Long term (current) use of oral hypoglycemic drugs: Secondary | ICD-10-CM | POA: Diagnosis not present

## 2024-04-03 DIAGNOSIS — E1142 Type 2 diabetes mellitus with diabetic polyneuropathy: Secondary | ICD-10-CM

## 2024-04-03 DIAGNOSIS — Z9181 History of falling: Secondary | ICD-10-CM | POA: Diagnosis not present

## 2024-04-03 DIAGNOSIS — F0394 Unspecified dementia, unspecified severity, with anxiety: Secondary | ICD-10-CM | POA: Diagnosis present

## 2024-04-03 DIAGNOSIS — E7800 Pure hypercholesterolemia, unspecified: Secondary | ICD-10-CM | POA: Diagnosis present

## 2024-04-03 DIAGNOSIS — F17210 Nicotine dependence, cigarettes, uncomplicated: Secondary | ICD-10-CM | POA: Diagnosis present

## 2024-04-03 DIAGNOSIS — Z79899 Other long term (current) drug therapy: Secondary | ICD-10-CM | POA: Diagnosis not present

## 2024-04-03 DIAGNOSIS — Z8249 Family history of ischemic heart disease and other diseases of the circulatory system: Secondary | ICD-10-CM | POA: Diagnosis not present

## 2024-04-03 LAB — GLUCOSE, CAPILLARY
Glucose-Capillary: 109 mg/dL — ABNORMAL HIGH (ref 70–99)
Glucose-Capillary: 112 mg/dL — ABNORMAL HIGH (ref 70–99)
Glucose-Capillary: 120 mg/dL — ABNORMAL HIGH (ref 70–99)
Glucose-Capillary: 143 mg/dL — ABNORMAL HIGH (ref 70–99)
Glucose-Capillary: 154 mg/dL — ABNORMAL HIGH (ref 70–99)
Glucose-Capillary: 168 mg/dL — ABNORMAL HIGH (ref 70–99)
Glucose-Capillary: 190 mg/dL — ABNORMAL HIGH (ref 70–99)
Glucose-Capillary: 202 mg/dL — ABNORMAL HIGH (ref 70–99)
Glucose-Capillary: 203 mg/dL — ABNORMAL HIGH (ref 70–99)
Glucose-Capillary: 231 mg/dL — ABNORMAL HIGH (ref 70–99)
Glucose-Capillary: 288 mg/dL — ABNORMAL HIGH (ref 70–99)

## 2024-04-03 LAB — MAGNESIUM
Magnesium: 1.6 mg/dL — ABNORMAL LOW (ref 1.7–2.4)
Magnesium: 2.1 mg/dL (ref 1.7–2.4)
Magnesium: 2 mg/dL (ref 1.7–2.4)

## 2024-04-03 LAB — BASIC METABOLIC PANEL WITH GFR
Anion gap: 13 (ref 5–15)
Anion gap: 16 — ABNORMAL HIGH (ref 5–15)
Anion gap: 13 (ref 5–15)
Anion gap: 13 (ref 5–15)
Anion gap: 10 (ref 5–15)
BUN: 25 mg/dL — ABNORMAL HIGH (ref 8–23)
BUN: 21 mg/dL (ref 8–23)
BUN: 16 mg/dL (ref 8–23)
BUN: 16 mg/dL (ref 8–23)
BUN: 15 mg/dL (ref 8–23)
CO2: 18 mmol/L — ABNORMAL LOW (ref 22–32)
CO2: 15 mmol/L — ABNORMAL LOW (ref 22–32)
CO2: 19 mmol/L — ABNORMAL LOW (ref 22–32)
CO2: 20 mmol/L — ABNORMAL LOW (ref 22–32)
CO2: 20 mmol/L — ABNORMAL LOW (ref 22–32)
Calcium: 8.9 mg/dL (ref 8.9–10.3)
Calcium: 8.7 mg/dL — ABNORMAL LOW (ref 8.9–10.3)
Calcium: 9.2 mg/dL (ref 8.9–10.3)
Calcium: 8.9 mg/dL (ref 8.9–10.3)
Calcium: 8.7 mg/dL — ABNORMAL LOW (ref 8.9–10.3)
Chloride: 105 mmol/L (ref 98–111)
Chloride: 102 mmol/L (ref 98–111)
Chloride: 108 mmol/L (ref 98–111)
Chloride: 108 mmol/L (ref 98–111)
Chloride: 108 mmol/L (ref 98–111)
Creatinine, Ser: 0.79 mg/dL (ref 0.44–1.00)
Creatinine, Ser: 0.71 mg/dL (ref 0.44–1.00)
Creatinine, Ser: 0.64 mg/dL (ref 0.44–1.00)
Creatinine, Ser: 0.63 mg/dL (ref 0.44–1.00)
Creatinine, Ser: 0.65 mg/dL (ref 0.44–1.00)
GFR, Estimated: >60 mL/min (ref >60)
GFR, Estimated: >60 mL/min (ref >60)
GFR, Estimated: >60 mL/min (ref >60)
GFR, Estimated: >60 mL/min (ref >60)
GFR, Estimated: >60 mL/min (ref >60)
Glucose, Bld: 162 mg/dL — ABNORMAL HIGH (ref 70–99)
Glucose, Bld: 341 mg/dL — ABNORMAL HIGH (ref 70–99)
Glucose, Bld: 110 mg/dL — ABNORMAL HIGH (ref 70–99)
Glucose, Bld: 132 mg/dL — ABNORMAL HIGH (ref 70–99)
Glucose, Bld: 232 mg/dL — ABNORMAL HIGH (ref 70–99)
Potassium: 3.7 mmol/L (ref 3.5–5.1)
Potassium: 4.4 mmol/L (ref 3.5–5.1)
Potassium: 3.2 mmol/L — ABNORMAL LOW (ref 3.5–5.1)
Potassium: 3.2 mmol/L — ABNORMAL LOW (ref 3.5–5.1)
Potassium: 3.1 mmol/L — ABNORMAL LOW (ref 3.5–5.1)
Sodium: 136 mmol/L (ref 135–145)
Sodium: 133 mmol/L — ABNORMAL LOW (ref 135–145)
Sodium: 140 mmol/L (ref 135–145)
Sodium: 141 mmol/L (ref 135–145)
Sodium: 138 mmol/L (ref 135–145)

## 2024-04-03 LAB — MRSA NEXT GEN BY PCR, NASAL: MRSA by PCR Next Gen: NOT DETECTED

## 2024-04-03 LAB — CBC
HCT: 40.2 % (ref 36.0–46.0)
Hemoglobin: 13.8 g/dL (ref 12.0–15.0)
MCH: 29.3 pg (ref 26.0–34.0)
MCHC: 34.3 g/dL (ref 30.0–36.0)
MCV: 85.4 fL (ref 80.0–100.0)
Platelets: 226 K/uL (ref 150–400)
RBC: 4.71 MIL/uL (ref 3.87–5.11)
RDW: 14 % (ref 11.5–15.5)
WBC: 7.9 K/uL (ref 4.0–10.5)
nRBC: 0 % (ref 0.0–0.2)

## 2024-04-03 LAB — CBG MONITORING, ED
Glucose-Capillary: 357 mg/dL — ABNORMAL HIGH (ref 70–99)
Glucose-Capillary: 296 mg/dL — ABNORMAL HIGH (ref 70–99)
Glucose-Capillary: 159 mg/dL — ABNORMAL HIGH (ref 70–99)
Glucose-Capillary: 319 mg/dL — ABNORMAL HIGH (ref 70–99)
Glucose-Capillary: 380 mg/dL — ABNORMAL HIGH (ref 70–99)

## 2024-04-03 LAB — PHOSPHORUS
Phosphorus: 2.3 mg/dL — ABNORMAL LOW (ref 2.5–4.6)
Phosphorus: 2 mg/dL — ABNORMAL LOW (ref 2.5–4.6)
Phosphorus: 2.1 mg/dL — ABNORMAL LOW (ref 2.5–4.6)

## 2024-04-03 LAB — TSH: TSH: 1.384 u[IU]/mL (ref 0.350–4.500)

## 2024-04-03 LAB — HIV ANTIBODY (ROUTINE TESTING W REFLEX): HIV Screen 4th Generation wRfx: NONREACTIVE

## 2024-04-03 LAB — BETA-HYDROXYBUTYRIC ACID
Beta-Hydroxybutyric Acid: >8 mmol/L — ABNORMAL HIGH (ref 0.05–0.27)
Beta-Hydroxybutyric Acid: 4.13 mmol/L — ABNORMAL HIGH (ref 0.05–0.27)
Beta-Hydroxybutyric Acid: 2.54 mmol/L — ABNORMAL HIGH (ref 0.05–0.27)
Beta-Hydroxybutyric Acid: 4.24 mmol/L — ABNORMAL HIGH (ref 0.05–0.27)

## 2024-04-03 LAB — VITAMIN B12: Vitamin B-12: 204 pg/mL (ref 180–914)

## 2024-04-03 MED ORDER — K PHOS MONO-SOD PHOS DI & MONO 155-852-130 MG PO TABS
500.0000 mg | ORAL_TABLET | Freq: Two times a day (BID) | ORAL | Status: AC
  Administered 2024-04-03 – 2024-04-04 (×6): 500 mg via ORAL
  Filled 2024-04-03 (×4): qty 2

## 2024-04-03 MED ORDER — GABAPENTIN 400 MG PO CAPS
800.0000 mg | ORAL_CAPSULE | Freq: Four times a day (QID) | ORAL | Status: DC
  Administered 2024-04-03 – 2024-04-05 (×14): 800 mg via ORAL
  Filled 2024-04-03 (×2): qty 2
  Filled 2024-04-03: qty 8
  Filled 2024-04-03 (×7): qty 2

## 2024-04-03 MED ORDER — LACTATED RINGERS IV SOLN: INTRAVENOUS | Status: AC

## 2024-04-03 MED ORDER — INSULIN REGULAR(HUMAN) IN NACL 100-0.9 UT/100ML-% IV SOLN
INTRAVENOUS | Status: DC
  Filled 2024-04-03: qty 100

## 2024-04-03 MED ORDER — ONDANSETRON HCL 4 MG/2ML IJ SOLN: 4.0000 mg | Freq: Four times a day (QID) | INTRAMUSCULAR | Status: DC | PRN

## 2024-04-03 MED ORDER — K PHOS MONO-SOD PHOS DI & MONO 155-852-130 MG PO TABS
500.0000 mg | ORAL_TABLET | Freq: Two times a day (BID) | ORAL | Status: DC
  Filled 2024-04-03 (×2): qty 2

## 2024-04-03 MED ORDER — MAGNESIUM SULFATE 2 GM/50ML IV SOLN
2.0000 g | Freq: Once | INTRAVENOUS | Status: AC
  Administered 2024-04-03 (×2): 2 g via INTRAVENOUS
  Filled 2024-04-03: qty 50

## 2024-04-03 MED ORDER — SERTRALINE HCL 50 MG PO TABS
200.0000 mg | ORAL_TABLET | Freq: Every day | ORAL | Status: DC
Start: 2024-04-03 — End: 2024-04-05
  Administered 2024-04-04 – 2024-04-05 (×4): 200 mg via ORAL
  Filled 2024-04-03 (×3): qty 4

## 2024-04-03 MED ORDER — ROSUVASTATIN CALCIUM 10 MG PO TABS
40.0000 mg | ORAL_TABLET | Freq: Every day | ORAL | Status: DC
  Administered 2024-04-04 – 2024-04-05 (×4): 40 mg via ORAL
  Filled 2024-04-03: qty 4
  Filled 2024-04-03: qty 2
  Filled 2024-04-03: qty 4

## 2024-04-03 MED ORDER — ACETAMINOPHEN 325 MG PO TABS: 650.0000 mg | ORAL_TABLET | Freq: Four times a day (QID) | ORAL | Status: DC | PRN

## 2024-04-03 MED ORDER — LACTATED RINGERS IV BOLUS: 20.0000 mL/kg | Freq: Once | INTRAVENOUS | Status: DC

## 2024-04-03 MED ORDER — CHLORHEXIDINE GLUCONATE CLOTH 2 % EX PADS
6.0000 | MEDICATED_PAD | Freq: Every day | CUTANEOUS | Status: DC
  Administered 2024-04-03 – 2024-04-04 (×4): 6 via TOPICAL

## 2024-04-03 MED ORDER — POTASSIUM CHLORIDE 20 MEQ PO PACK
40.0000 meq | PACK | Freq: Once | ORAL | Status: AC
  Administered 2024-04-03 (×2): 40 meq via ORAL
  Filled 2024-04-03: qty 2

## 2024-04-03 MED ORDER — DEXTROSE IN LACTATED RINGERS 5 % IV SOLN: INTRAVENOUS | Status: AC

## 2024-04-03 MED ORDER — ENOXAPARIN SODIUM 40 MG/0.4ML IJ SOSY
40.0000 mg | PREFILLED_SYRINGE | INTRAMUSCULAR | Status: DC
  Administered 2024-04-03 – 2024-04-05 (×6): 40 mg via SUBCUTANEOUS
  Filled 2024-04-03 (×3): qty 0.4

## 2024-04-03 MED ORDER — POTASSIUM CHLORIDE 10 MEQ/100ML IV SOLN
10.0000 meq | INTRAVENOUS | Status: DC
  Filled 2024-04-03: qty 100

## 2024-04-03 MED ORDER — MAGNESIUM HYDROXIDE 400 MG/5ML PO SUSP: 30.0000 mL | Freq: Every day | ORAL | Status: DC | PRN

## 2024-04-03 MED ORDER — TRAZODONE HCL 50 MG PO TABS: 25.0000 mg | ORAL_TABLET | Freq: Every evening | ORAL | Status: DC | PRN

## 2024-04-03 MED ORDER — TIZANIDINE HCL 2 MG PO TABS: 4.0000 mg | ORAL_TABLET | Freq: Three times a day (TID) | ORAL | Status: DC | PRN

## 2024-04-03 MED ORDER — ONDANSETRON HCL 4 MG PO TABS
4.0000 mg | ORAL_TABLET | Freq: Four times a day (QID) | ORAL | Status: DC | PRN
Start: 2024-04-03 — End: 2024-04-05

## 2024-04-03 MED ORDER — LACTATED RINGERS IV SOLN: INTRAVENOUS | Status: DC

## 2024-04-03 MED ORDER — LISINOPRIL 5 MG PO TABS: 20.0000 mg | ORAL_TABLET | Freq: Every day | ORAL | Status: DC

## 2024-04-03 MED ORDER — ACETAMINOPHEN 650 MG RE SUPP: 650.0000 mg | Freq: Four times a day (QID) | RECTAL | Status: DC | PRN

## 2024-04-03 MED ORDER — POTASSIUM CHLORIDE 10 MEQ/100ML IV SOLN
10.0000 meq | INTRAVENOUS | Status: AC
  Administered 2024-04-03 (×4): 10 meq via INTRAVENOUS
  Filled 2024-04-03: qty 100

## 2024-04-03 MED ORDER — DEXTROSE 50 % IV SOLN: 0.0000 mL | INTRAVENOUS | Status: DC | PRN

## 2024-04-03 MED ORDER — EZETIMIBE 10 MG PO TABS
10.0000 mg | ORAL_TABLET | Freq: Every day | ORAL | Status: DC
  Administered 2024-04-04 – 2024-04-05 (×4): 10 mg via ORAL
  Filled 2024-04-03 (×3): qty 1

## 2024-04-03 MED ORDER — AMLODIPINE BESYLATE 5 MG PO TABS
5.0000 mg | ORAL_TABLET | Freq: Every day | ORAL | Status: DC
  Filled 2024-04-03: qty 1

## 2024-04-03 MED ORDER — INSULIN REGULAR(HUMAN) IN NACL 100-0.9 UT/100ML-% IV SOLN
INTRAVENOUS | Status: DC
  Administered 2024-04-03 (×2): 10.5 [IU]/h via INTRAVENOUS
  Administered 2024-04-03 (×2): 1 [IU]/h via INTRAVENOUS
  Administered 2024-04-03 (×2): 7.5 [IU]/h via INTRAVENOUS

## 2024-04-03 MED ORDER — POTASSIUM CHLORIDE 10 MEQ/100ML IV SOLN
10.0000 meq | INTRAVENOUS | Status: AC
  Administered 2024-04-03 – 2024-04-04 (×8): 10 meq via INTRAVENOUS
  Filled 2024-04-03 (×4): qty 100

## 2024-04-03 NOTE — Assessment & Plan Note (Signed)
Will continue Zoloft. 

## 2024-04-03 NOTE — Progress Notes (Addendum)
 PROGRESS NOTE    Tammy Acosta  FMW:[id number] DOB: [birth year] DOA: 04/02/2024 PCP: System, Provider Not In  Chief Complaint  Patient presents with   Altered Mental Status   Diabetic Ketoacidosis    Hospital Course:  Tammy Acosta is a 68 y.o. female with medical history significant for type 2 diabetes mellitus, hypertension, dyslipidemia, depression and osteoarthritis presented due to AMS, baseline independent with ADL's. Patient admitted for DKA and chronic AMS for months. Hospital course as below  Subjective: Patient was examined at the bedside in ER, new to me today. Very pleasant, confused She knows her name, age, daughter's name. Not aware of place and time Discussed with daughter Tammy Acosta over the phone and provided updates   Objective: Vitals:   04/03/24 0815 04/03/24 0819 04/03/24 0830 04/03/24 0845  BP:   (!) 150/76   Pulse: 85  85 84  Resp:   18 18  Temp:  98.5 F (36.9 C)    TempSrc:  Oral    SpO2: 100%  100% 100%  Weight:      Height:       No intake or output data in the 24 hours ending 04/03/24 0912 Filed Weights   04/02/24 2207  Weight: 49.9 kg    Examination: GENERAL: no acute distress NECK:  Supple, no jugular venous distention. No thyroid  enlargement, no tenderness.  LUNGS: Normal breath sounds bilaterally, no wheezing, rales,rhonchi or crepitation. No use of accessory muscles of respiration.  CARDIOVASCULAR: Regular rate and rhythm, S1, S2 normal. No murmurs, rubs, or gallops.  ABDOMEN: Soft, nondistended, nontender. Bowel sounds present. No organomegaly or mass.  EXTREMITIES: No pedal edema, cyanosis, or clubbing.  NEUROLOGIC: AO 1-2, pleasantly confused, Cranial nerves II through XII are intact. Muscle strength 5/5 in all extremities. Sensation intact. Gait not checked.  SKIN: No obvious rash, lesion, or ulce  Assessment & Plan:  Principal Problem:   DKA, type 2 (HCC) Active Problems:   Essential hypertension   Dyslipidemia    Diabetic peripheral neuropathy (HCC)   Anxiety and depression   DKA DM type 2 - On presentation VBG pH 7.22, Bicarb 11, AG 21 - Home regimen: Metformin , Lantus  59 units daily, likely non complaint due to ongoing memory issues - HbA1c pending - s/p Insulin  gtt, DKA resolved but did not receive long acting insulin  for bridging, repeat labs in DKA - Infectious work up negative - UA and CXR - continue the on IV insulin  drip per [employer] DKA protocol. - IV fluids - Not nauseas, uncomplicated DKA - start consistent carb diet - BMP, Mag, phos every 4 hrs  Hypophosphatemia Hypomagnesemia - Monitor and replete as needed  Chronic encephalopathy - as per daughter Tammy Acosta has been having memory issues ongoing for quite sometime (~ 1 year) and gradually worsening, Suspect if she has worsening dementia - CT head negative for acute changes - MRI brain if compatible - check TSH, B12, Thiamine, Ammonia - OT slums - PT recurrent falls  AKI - resolved - Cr 1.26 -> 0.79 s/p IV fluids   Essential hypertension - Hold Amlodipine  5mg , Lisinopril  40mg  due to low BP   Dyslipidemia - statin therapy and Zetia    Diabetic peripheral neuropathy (HCC) - Neurontin .   Anxiety and depression - Zoloft .  Incidental findings: displaced transverse fracture through the right humeral neck with appears new from 2013 and is favored to be chronic  DVT prophylaxis: Lovenox  SQ   Code Status: Do not attempt resuscitation (DNR) PRE-ARREST INTERVENTIONS DESIRED Disposition:  TBD  Consultants:  None  Procedures:  None  Antimicrobials:  Anti-infectives (From admission, onward)    None       Data Reviewed: I have personally reviewed following labs and imaging studies CBC: Recent Labs  Lab 04/02/24 2217 04/03/24 0423  WBC 8.8 7.9  NEUTROABS 5.8  --   HGB 15.7* 13.8  HCT 47.2* 40.2  MCV 87.2 85.4  PLT 247 226   Basic Metabolic Panel: Recent Labs  Lab 04/02/24 2217 04/03/24 0423  NA 133*  136  K 3.9 3.7  CL 101 105  CO2 11* 18*  GLUCOSE 398* 162*  BUN 30* 25*  CREATININE 1.26* 0.79  CALCIUM  9.1 8.9   GFR: Estimated Creatinine Clearance: 52.3 mL/min (by C-G formula based on SCr of 0.79 mg/dL). Liver Function Tests: Recent Labs  Lab 04/02/24 2217  AST 20  ALT 19  ALKPHOS 77  BILITOT 1.6*  PROT 7.7  ALBUMIN 3.7   CBG: Recent Labs  Lab 04/03/24 0129 04/03/24 0231 04/03/24 0341  GLUCAP 357* 296* 159*    No results found for this or any previous visit (from the past 240 hours).   Radiology Studies: CT Head Wo Contrast Result Date: 04/02/2024 CLINICAL DATA:  Mental status change, unknown cause EXAM: CT HEAD WITHOUT CONTRAST TECHNIQUE: Contiguous axial images were obtained from the base of the skull through the vertex without intravenous contrast. RADIATION DOSE REDUCTION: This exam was performed according to the departmental dose-optimization program which includes automated exposure control, adjustment of the mA and/or kV according to patient size and/or use of iterative reconstruction technique. COMPARISON:  CT head 12/10/2019 FINDINGS: Brain: No evidence of large-territorial acute infarction. No parenchymal hemorrhage. No mass lesion. No extra-axial collection. No mass effect or midline shift. No hydrocephalus. Basilar cisterns are patent. Vascular: No hyperdense vessel. Atherosclerotic calcifications are present within the cavernous internal carotid and vertebral arteries. Skull: No acute fracture or focal lesion. Sinuses/Orbits: Paranasal sinuses and mastoid air cells are clear. The orbits are unremarkable. Other: None. IMPRESSION: No acute intracranial abnormality. Electronically Signed   By: Morgane  Naveau M.D.   On: 04/02/2024 23:18   DG Chest 1 View Result Date: 04/02/2024 CLINICAL DATA:  Altered mental status EXAM: CHEST  1 VIEW COMPARISON:  Chest x-ray 08/11/2012 FINDINGS: The heart size and mediastinal contours are within normal limits. Both lungs are  clear. Left shoulder arthroplasty and thoraco lumbar fusion hardware is partially visualized. Displaced transverse fracture through the right humeral neck appears new from 2013 and is favored as chronic. IMPRESSION: 1. No active disease. 2. Displaced transverse fracture through the right humeral neck appears new from 2013 and is favored as chronic. Correlate clinically. Electronically Signed   By: Greig Pique M.D.   On: 04/02/2024 22:45    Scheduled Meds:  amLODipine   5 mg Oral Daily   enoxaparin  (LOVENOX ) injection  40 mg Subcutaneous Q24H   ezetimibe   10 mg Oral Daily   gabapentin   800 mg Oral QID   rosuvastatin   40 mg Oral Daily   sertraline   200 mg Oral Daily   Continuous Infusions:  dextrose  5% lactated ringers  125 mL/hr at 04/03/24 0346   insulin  Stopped (04/03/24 0342)   lactated ringers  Stopped (04/03/24 0343)     LOS: 0 days  MDM: Patient is high risk for one or more organ failure.  They necessitate ongoing hospitalization for continued IV therapies and subsequent lab monitoring. Total time spent interpreting labs and vitals, reviewing the medical record, coordinating care amongst consultants and care  team members, directly assessing and discussing care with the patient and/or family: 55 min  Laree Lock, MD [facility]  To contact the attending physician between 7A-7P please use Epic Chat. To contact the covering physician during after hours 7P-7A, please review Amion.  04/03/2024, 9:12 AM   *This document has been created with the assistance of dictation software. Please excuse typographical errors. *

## 2024-04-03 NOTE — Progress Notes (Signed)
 Dr. Jerelene notified that no urine has been charted since pt arrived to hospital and pt does not need to urinate. Bladder scan 128. No new orders received.

## 2024-04-03 NOTE — Progress Notes (Signed)
 Pt arrived to unit at this time. Pt is alert but somewhat confused. VSS on room air. CBG 288. Insulin  gtt and LR infusing.

## 2024-04-03 NOTE — Inpatient Diabetes Management (Signed)
 Inpatient Diabetes Program Recommendations  AACE/ADA: New Consensus Statement on Inpatient Glycemic Control (2015)  Target Ranges:  Prepandial:   less than 140 mg/dL      Peak postprandial:   less than 180 mg/dL (1-2 hours)      Critically ill patients:  140 - 180 mg/dL   Lab Results  Component Value Date   GLUCAP 319 (H) 04/03/2024   HGBA1C 16.4 Repeated and verified X2. (H) 06/23/2023    Latest Reference Range & Units 04/03/24 01:29 04/03/24 02:31 04/03/24 03:41 04/03/24 09:44  Glucose-Capillary 70 - 99 mg/dL 642 (H) 703 (H) 840 (H) IV insulin  D/C'd 319 (H)  (H): Data is abnormally high  Diabetes history: DM2 Outpatient Diabetes medications: Lantus  59 units daily Metformin  1 gm bid Current orders for Inpatient glycemic control: IV insulin  discontinued 0341 without insulin  ordered  Inpatient Diabetes Program Recommendations:   IV insulin  drip discontinued @ 341 am today without further CBGs or insulin  administered. Please consider: -Semglee  40 units now and daily -Novolog  0-15 units tid, 0-5 units hs If labs determine patient in DKA will resume insulin  drip. Discussed with RN Carlyon Pollack and Dr. Jerelene.  Thank you, Tammy Scales E. Kiaya Haliburton, RN, MSN, CDCES  Diabetes Coordinator Inpatient Glycemic Control Team Team Pager [phone] (8am-5pm) 04/03/2024 10:03 AM

## 2024-04-03 NOTE — Progress Notes (Signed)
 Potassium level was 3.1. Notified Dr. Cleatus and received new order for 4 runs of Potasium. Will administer and continue to monitor.

## 2024-04-03 NOTE — Assessment & Plan Note (Signed)
-   Will continue Neurontin .

## 2024-04-03 NOTE — H&P (Signed)
 [facility]   PATIENT NAME: Tammy Acosta    MR#:  [id number]  DATE OF BIRTH:  09-May-1956  DATE OF ADMISSION:  04/02/2024  PRIMARY CARE PHYSICIAN: System, Provider Not In   Patient is coming from: Home  REQUESTING/REFERRING PHYSICIAN: [name], MD  CHIEF COMPLAINT:   Chief Complaint  Patient presents with   Altered Mental Status    HISTORY OF PRESENT ILLNESS:  Tammy Acosta is a 68 y.o. female with medical history significant for type 2 diabetes mellitus, hypertension, dyslipidemia, depression and osteoarthritis, who presented to the ER with acute onset of altered mental status.  Per her daughter the patient has been refusing to take her medications, to get out of bed and refusing to eat.  At baseline she is able to take care of her ADLs.  She denied any nausea or vomiting or abdominal pain.  No polyuria or polydipsia.  No fever or chills.  No chest pain or palpitations.  No cough or wheezing or dyspnea.  No dysuria, oliguria or hematuria or flank pain.  No falls or trauma.  No bleeding diathesis.    ED Course: When she came to the ER, vital signs were within normal.  Labs revealed pH 7.218 HCO3 of 13.9.  CMP revealed sodium 133 and CO2 11, blood glucose 398 and BUN 30 with creatinine 1.26 and anion gap 21.  Total bili is 1.6.  CBC showed hemoconcentration.  UA came back with 30 protein, more than 500 glucose and 80 ketones and was otherwise negative. EKG as reviewed by me : Normal sinus rhythm with rate of 86 with poor R wave progression and borderline right axis deviation. Imaging: Portable chest x-ray showed displaced transverse fracture through the right humeral neck with appears new from 2013 and is favored to be chronic.  It showed no active disease.  Noncontrasted head CT scan revealed no acute ventricular abnormality.  The patient was given 1 L bolus of IV lactated Ringer  and was started on IV insulin  drip per DKA protocol.  She will be admitted to stepdown unit  bed for further evaluation and management. PAST MEDICAL HISTORY:   Past Medical History:  Diagnosis Date   Altered mental status 02/04/2014   Arthritis    Colon polyps    Depression    Diabetes mellitus without complication (HCC)    Pt states she takes Insulin  and metformin .   Hypercholesteremia    Hyperlipidemia    Hypertension    Shingles    10/17   Spine disorder    Trigeminal herpes zoster (left V1 distribution) 06/24/2016   Viral upper respiratory illness 12/03/2015    PAST SURGICAL HISTORY:   Past Surgical History:  Procedure Laterality Date   ABDOMINAL HYSTERECTOMY     BACK SURGERY     CHOLECYSTECTOMY     KYPHOPLASTY      SOCIAL HISTORY:   Social History   Tobacco Use   Smoking status: Every Day    Current packs/day: 0.00    Types: Cigarettes   Smokeless tobacco: Never  Substance Use Topics   Alcohol use: No    FAMILY HISTORY:   Family History  Problem Relation Age of Onset   Heart disease Mother    Hypertension Mother    Cancer Mother        breast   Breast cancer Mother        early 5's   Heart disease Father    Arthritis Other  Parent, other relative   Colon cancer Other        Parent   Lung cancer Other        Other relative   Diabetes Other        Other relative   Uterine cancer Other        Other relative   Cancer Maternal Aunt        breast   Breast cancer Maternal Aunt    Cancer Maternal Aunt        ovarian   Breast cancer Cousin    Breast cancer Cousin    Breast cancer Cousin     DRUG ALLERGIES:   Allergies  Allergen Reactions   Penicillins Anaphylaxis and Hives   Meloxicam     Tetracyclines & Related Rash    REVIEW OF SYSTEMS:   ROS As per history of present illness. All pertinent systems were reviewed above. Constitutional, HEENT, cardiovascular, respiratory, GI, GU, musculoskeletal, neuro, psychiatric, endocrine, integumentary and hematologic systems were reviewed and are otherwise negative/unremarkable  except for positive findings mentioned above in the HPI.   MEDICATIONS AT HOME:   Prior to Admission medications   Medication Sig Start Date End Date Taking? Authorizing Provider  amLODipine  (NORVASC ) 5 MG tablet Take 1 tablet (5 mg total) by mouth daily. TAKE 1 TABLET(5 MG) BY MOUTH DAILY 02/02/24   Marylynn Verneita CROME, MD  blood glucose meter kit and supplies Dispense One touch meter.  E11.9 10/03/15   Maribeth Camellia MATSU, MD  Blood Glucose Monitoring Suppl (ONE TOUCH ULTRA 2) w/Device KIT Dispense 1 meter to use to test blood glucose once daily. Dx code: E11.42. 04/12/19   Maribeth Camellia MATSU, MD  Continuous Glucose Receiver (FREESTYLE LIBRE 3 READER) DEVI 1 each by Does not apply route daily. Use to check glucose continuously. 10/26/23   Marylynn Verneita CROME, MD  Continuous Glucose Sensor (FREESTYLE LIBRE 3 SENSOR) MISC Place 1 sensor on the skin every 14 days. Use to check glucose continuously 01/20/24   Marylynn Verneita CROME, MD  ezetimibe  (ZETIA ) 10 MG tablet TAKE 1 TABLET(10 MG) BY MOUTH DAILY 02/02/24   Marylynn Verneita CROME, MD  gabapentin  (NEURONTIN ) 800 MG tablet TAKE 1 TABLET(800 MG) BY MOUTH FOUR TIMES DAILY 12/22/23   Gretel App, NP  glucose blood (ONETOUCH ULTRA) test strip USE 1 STRIP TO CHECK GLUCOSE UP TO THREE TIMES DAILY AS DIRECTED. 10/26/23   Marylynn Verneita CROME, MD  insulin  glargine (LANTUS  SOLOSTAR) 100 UNIT/ML Solostar Pen Inject 65 Units into the skin daily. Patient taking differently: Inject 59 Units into the skin daily. 09/08/23   Maribeth Camellia MATSU, MD  Insulin  Pen Needle (PEN NEEDLES) 32G X 5 MM MISC 1 pen by Does not apply route daily. Use to inject insulin  daily 01/10/18   Maribeth Camellia MATSU, MD  Lancets [facility] DELICA PLUS [place]) MISC USE   TO CHECK GLUCOSE UP TO THREE TIMES DAILY AS DIRECTED 10/26/23   Marylynn Verneita CROME, MD  lisinopril  (ZESTRIL ) 40 MG tablet TAKE 1 TABLET(40 MG) BY MOUTH DAILY 01/20/24   Marylynn Verneita CROME, MD  metFORMIN  (GLUCOPHAGE ) 1000 MG tablet TAKE 1 TABLET(1000 MG) BY MOUTH  TWICE DAILY WITH A MEAL 10/26/23   Marylynn Verneita CROME, MD  rosuvastatin  (CRESTOR ) 40 MG tablet TAKE 1 TABLET(40 MG) BY MOUTH DAILY 01/20/24   Marylynn Verneita CROME, MD  sertraline  (ZOLOFT ) 100 MG tablet Take 2 tablets (200 mg total) by mouth daily. 10/26/23   Marylynn Verneita CROME, MD  tiZANidine  (ZANAFLEX ) 4 MG tablet  TAKE 1 TABLET BY MOUTH EVERY 8 HOURS AS NEEDED FOR MUSCLE SPASM 10/15/21   Maribeth Camellia MATSU, MD      VITAL SIGNS:  Blood pressure 131/74, pulse 87, temperature 98 F (36.7 C), temperature source Oral, resp. rate 20, height 5' 3 (1.6 m), weight 49.9 kg, SpO2 100%.  PHYSICAL EXAMINATION:  Physical Exam  GENERAL: Acutely ill 68 y.o.-year-old patient lying in the bed with no acute distress.  EYES: Pupils equal, round, reactive to light and accommodation. No scleral icterus. Extraocular muscles intact.  HEENT: Head atraumatic, normocephalic. Oropharynx and nasopharynx clear.  NECK:  Supple, no jugular venous distention. No thyroid  enlargement, no tenderness.  LUNGS: Normal breath sounds bilaterally, no wheezing, rales,rhonchi or crepitation. No use of accessory muscles of respiration.  CARDIOVASCULAR: Regular rate and rhythm, S1, S2 normal. No murmurs, rubs, or gallops.  ABDOMEN: Soft, nondistended, nontender. Bowel sounds present. No organomegaly or mass.  EXTREMITIES: No pedal edema, cyanosis, or clubbing.  NEUROLOGIC: Cranial nerves II through XII are intact. Muscle strength 5/5 in all extremities. Sensation intact. Gait not checked.  PSYCHIATRIC: The patient is alert and oriented x 3.  Normal affect and good eye contact. SKIN: No obvious rash, lesion, or ulcer.   LABORATORY PANEL:   CBC Recent Labs  Lab 04/02/24 2217  WBC 8.8  HGB 15.7*  HCT 47.2*  PLT 247   ------------------------------------------------------------------------------------------------------------------  Chemistries  Recent Labs  Lab 04/02/24 2217  NA 133*  K 3.9  CL 101  CO2 11*  GLUCOSE 398*  BUN 30*   CREATININE 1.26*  CALCIUM  9.1  AST 20  ALT 19  ALKPHOS 77  BILITOT 1.6*   ------------------------------------------------------------------------------------------------------------------  Cardiac Enzymes No results for input(s): TROPONINI in the last 168 hours. ------------------------------------------------------------------------------------------------------------------  RADIOLOGY:  CT Head Wo Contrast Result Date: 04/02/2024 CLINICAL DATA:  Mental status change, unknown cause EXAM: CT HEAD WITHOUT CONTRAST TECHNIQUE: Contiguous axial images were obtained from the base of the skull through the vertex without intravenous contrast. RADIATION DOSE REDUCTION: This exam was performed according to the departmental dose-optimization program which includes automated exposure control, adjustment of the mA and/or kV according to patient size and/or use of iterative reconstruction technique. COMPARISON:  CT head 12/10/2019 FINDINGS: Brain: No evidence of large-territorial acute infarction. No parenchymal hemorrhage. No mass lesion. No extra-axial collection. No mass effect or midline shift. No hydrocephalus. Basilar cisterns are patent. Vascular: No hyperdense vessel. Atherosclerotic calcifications are present within the cavernous internal carotid and vertebral arteries. Skull: No acute fracture or focal lesion. Sinuses/Orbits: Paranasal sinuses and mastoid air cells are clear. The orbits are unremarkable. Other: None. IMPRESSION: No acute intracranial abnormality. Electronically Signed   By: Morgane  Naveau M.D.   On: 04/02/2024 23:18   DG Chest 1 View Result Date: 04/02/2024 CLINICAL DATA:  Altered mental status EXAM: CHEST  1 VIEW COMPARISON:  Chest x-ray 08/11/2012 FINDINGS: The heart size and mediastinal contours are within normal limits. Both lungs are clear. Left shoulder arthroplasty and thoraco lumbar fusion hardware is partially visualized. Displaced transverse fracture through the right  humeral neck appears new from 2013 and is favored as chronic. IMPRESSION: 1. No active disease. 2. Displaced transverse fracture through the right humeral neck appears new from 2013 and is favored as chronic. Correlate clinically. Electronically Signed   By: Greig Pique M.D.   On: 04/02/2024 22:45      IMPRESSION AND PLAN:  Assessment and Plan: * DKA, type 2 (HCC) - The patient will be admitted to a stepdown  bed. - We will continue the on IV insulin  drip per [facility] DKA protocol. - The patient will be aggressively hydrated with IV normal saline. - Will follow serial BMPs.   Essential hypertension - We will continue antihypertensive therapy, while holding off nephrotoxins.  Dyslipidemia - Will continue statin therapy and Zetia .  Diabetic peripheral neuropathy (HCC) - Will continue Neurontin .  Anxiety and depression - Will continue Zoloft .   DVT prophylaxis: Lovenox . Advanced Care Planning:  Code Status: full code. Family Communication:  The plan of care was discussed in details with the patient (and family). I answered all questions. The patient agreed to proceed with the above mentioned plan. Further management will depend upon hospital course. Disposition Plan: Back to previous home environment Consults called: none. All the records are reviewed and case discussed with ED provider.  Status is: Inpatient  At the time of the admission, it appears that the appropriate admission status for this patient is inpatient.  This is judged to be reasonable and necessary in order to provide the required intensity of service to ensure the patient's safety given the presenting symptoms, physical exam findings and initial radiographic and laboratory data in the context of comorbid conditions.  The patient requires inpatient status due to high intensity of service, high risk of further deterioration and high frequency of surveillance required.  I certify that at the time of admission, it is  my clinical judgment that the patient will require inpatient hospital care extending more than 2 midnights.                            Dispo: The patient is from: Home              Anticipated d/c is to: Home              Patient currently is not medically stable to d/c.              Difficult to place patient: No Authorized and performed by: Madison Peaches, MD Total critical care time:   45     minutes. Due to a high probability of clinically significant, life-threatening deterioration, the patient required my highest level of preparedness to intervene emergently and I personally spent this critical care time directly and personally managing the patient.  This critical care time included obtaining a history, examining the patient, pulse oximetry, ordering and review of studies, arranging urgent treatment with development of management plan, evaluation of patient's response to treatment, frequent reassessment, and discussions with other providers. This critical care time was performed to assess and manage the high probability of imminent, life-threatening deterioration that could result in multiorgan failure.  It was exclusive of separately billable procedures and treating other patients and teaching time.   Madison DELENA Peaches M.D on 04/03/2024 at 1:50 AM  [facility]   From 7 PM-7 AM, contact night-coverage [email or web address]  CC: Primary care physician; System, Provider Not In

## 2024-04-03 NOTE — Assessment & Plan Note (Signed)
-   We will continue antihypertensive therapy, while holding off nephrotoxins.

## 2024-04-03 NOTE — ED Notes (Signed)
 Messaged Mansy MD to notify  pt insulin  drip stopped per [facility] and D5LR started.

## 2024-04-03 NOTE — Consult Note (Signed)
 PHARMACY CONSULT NOTE - FOLLOW UP  Pharmacy Consult for Electrolyte Monitoring and Replacement   Recent Labs: Potassium (mmol/L)  Date Value  04/03/2024 3.1 (L)  01/09/2013 4.1   Magnesium  (mg/dL)  Date Value  91/88/7974 2.0  11/06/2013 1.6 (L)   Calcium  (mg/dL)  Date Value  91/88/7974 8.7 (L)   Calcium , Total (mg/dL)  Date Value  94/80/7985 9.5   Albumin (g/dL)  Date Value  91/89/7974 3.7  11/18/2012 3.7   Phosphorus (mg/dL)  Date Value  91/88/7974 2.1 (L)   Sodium (mmol/L)  Date Value  04/03/2024 138  01/09/2013 139     Assessment: 68 y.o. female with medical history significant for type 2 diabetes mellitus, hypertension, dyslipidemia, depression and osteoarthritis presented due to AMS, baseline independent with ADL's. Patient admitted for DKA and chronic AMS for months.   On insulin  gtt.  On D5 with LR.   Goal of Therapy:  K+ > 4 while on insulin  infusion.   Plan:  Kphos replaced by MD  KCL 10 mEq x 4 BMP q4H.      Tammy Acosta ,PharmD Clinical Pharmacist 04/03/2024 9:27 PM

## 2024-04-03 NOTE — ED Notes (Signed)
 Informed RN bed assigned

## 2024-04-03 NOTE — Assessment & Plan Note (Signed)
 Will continue statin therapy and Zetia.

## 2024-04-03 NOTE — Assessment & Plan Note (Addendum)
-

## 2024-04-03 NOTE — ED Notes (Signed)
 MD Mansy updated that pt follow up labs resulted.

## 2024-04-03 NOTE — ED Notes (Signed)
 Pt primary contact/daughter [name] called for update. Update provided.

## 2024-04-03 NOTE — ED Notes (Signed)
 Pt sleeping at this time.

## 2024-04-04 ENCOUNTER — Inpatient Hospital Stay

## 2024-04-04 LAB — BASIC METABOLIC PANEL WITH GFR
Anion gap: 6 (ref 5–15)
Anion gap: 9 (ref 5–15)
Anion gap: 10 (ref 5–15)
BUN: 14 mg/dL (ref 8–23)
BUN: 12 mg/dL (ref 8–23)
BUN: 19 mg/dL (ref 8–23)
CO2: 24 mmol/L (ref 22–32)
CO2: 23 mmol/L (ref 22–32)
CO2: 23 mmol/L (ref 22–32)
Calcium: 8.5 mg/dL — ABNORMAL LOW (ref 8.9–10.3)
Calcium: 8.5 mg/dL — ABNORMAL LOW (ref 8.9–10.3)
Calcium: 8.1 mg/dL — ABNORMAL LOW (ref 8.9–10.3)
Chloride: 109 mmol/L (ref 98–111)
Chloride: 108 mmol/L (ref 98–111)
Chloride: 103 mmol/L (ref 98–111)
Creatinine, Ser: 0.73 mg/dL (ref 0.44–1.00)
Creatinine, Ser: 0.62 mg/dL (ref 0.44–1.00)
Creatinine, Ser: 0.94 mg/dL (ref 0.44–1.00)
GFR, Estimated: >60 mL/min (ref >60)
GFR, Estimated: >60 mL/min (ref >60)
GFR, Estimated: >60 mL/min (ref >60)
Glucose, Bld: 123 mg/dL — ABNORMAL HIGH (ref 70–99)
Glucose, Bld: 228 mg/dL — ABNORMAL HIGH (ref 70–99)
Glucose, Bld: 239 mg/dL — ABNORMAL HIGH (ref 70–99)
Potassium: 3.7 mmol/L (ref 3.5–5.1)
Potassium: 4.1 mmol/L (ref 3.5–5.1)
Potassium: 4.1 mmol/L (ref 3.5–5.1)
Sodium: 139 mmol/L (ref 135–145)
Sodium: 140 mmol/L (ref 135–145)
Sodium: 136 mmol/L (ref 135–145)

## 2024-04-04 LAB — GLUCOSE, CAPILLARY
Glucose-Capillary: 143 mg/dL — ABNORMAL HIGH (ref 70–99)
Glucose-Capillary: 140 mg/dL — ABNORMAL HIGH (ref 70–99)
Glucose-Capillary: 134 mg/dL — ABNORMAL HIGH (ref 70–99)
Glucose-Capillary: 281 mg/dL — ABNORMAL HIGH (ref 70–99)
Glucose-Capillary: 354 mg/dL — ABNORMAL HIGH (ref 70–99)
Glucose-Capillary: 284 mg/dL — ABNORMAL HIGH (ref 70–99)
Glucose-Capillary: 263 mg/dL — ABNORMAL HIGH (ref 70–99)
Glucose-Capillary: 288 mg/dL — ABNORMAL HIGH (ref 70–99)

## 2024-04-04 LAB — MAGNESIUM
Magnesium: 1.9 mg/dL (ref 1.7–2.4)
Magnesium: 1.8 mg/dL (ref 1.7–2.4)

## 2024-04-04 LAB — CBC
HCT: 39.1 % (ref 36.0–46.0)
Hemoglobin: 13.4 g/dL (ref 12.0–15.0)
MCH: 29.3 pg (ref 26.0–34.0)
MCHC: 34.3 g/dL (ref 30.0–36.0)
MCV: 85.6 fL (ref 80.0–100.0)
Platelets: 192 K/uL (ref 150–400)
RBC: 4.57 MIL/uL (ref 3.87–5.11)
RDW: 14.2 % (ref 11.5–15.5)
WBC: 4.1 K/uL (ref 4.0–10.5)
nRBC: 0 % (ref 0.0–0.2)

## 2024-04-04 LAB — PHOSPHORUS
Phosphorus: 2.8 mg/dL (ref 2.5–4.6)
Phosphorus: 3 mg/dL (ref 2.5–4.6)

## 2024-04-04 LAB — HEMOGLOBIN A1C
Hgb A1c MFr Bld: >15.5 % — ABNORMAL HIGH (ref 4.8–5.6)
Mean Plasma Glucose: >398 mg/dL

## 2024-04-04 LAB — AMMONIA: Ammonia: 16 umol/L (ref 9–35)

## 2024-04-04 MED ORDER — INSULIN GLARGINE-YFGN 100 UNIT/ML ~~LOC~~ SOLN
40.0000 [IU] | SUBCUTANEOUS | Status: DC
  Administered 2024-04-05 (×2): 40 [IU] via SUBCUTANEOUS
  Filled 2024-04-04: qty 0.4

## 2024-04-04 MED ORDER — INSULIN GLARGINE-YFGN 100 UNIT/ML ~~LOC~~ SOLN
28.0000 [IU] | Freq: Once | SUBCUTANEOUS | Status: DC
  Filled 2024-04-04: qty 0.28

## 2024-04-04 MED ORDER — INSULIN GLARGINE-YFGN 100 UNIT/ML ~~LOC~~ SOLN
25.0000 [IU] | Freq: Once | SUBCUTANEOUS | Status: AC
  Administered 2024-04-04 (×2): 25 [IU] via SUBCUTANEOUS
  Filled 2024-04-04: qty 0.25

## 2024-04-04 MED ORDER — INSULIN ASPART 100 UNIT/ML IJ SOLN: 0.0000 [IU] | Freq: Every day | INTRAMUSCULAR | Status: DC

## 2024-04-04 MED ORDER — INSULIN ASPART 100 UNIT/ML IJ SOLN
0.0000 [IU] | Freq: Three times a day (TID) | INTRAMUSCULAR | Status: DC
  Administered 2024-04-04 (×2): 5 [IU] via SUBCUTANEOUS
  Administered 2024-04-04 (×2): 9 [IU] via SUBCUTANEOUS
  Filled 2024-04-04 (×2): qty 1

## 2024-04-04 MED ORDER — VITAMIN B-12 1000 MCG PO TABS
1000.0000 ug | ORAL_TABLET | Freq: Every day | ORAL | Status: DC
  Administered 2024-04-05 (×2): 1000 ug via ORAL
  Filled 2024-04-04: qty 1

## 2024-04-04 MED ORDER — INSULIN GLARGINE-YFGN 100 UNIT/ML ~~LOC~~ SOLN
12.0000 [IU] | SUBCUTANEOUS | Status: DC
  Administered 2024-04-04 (×2): 12 [IU] via SUBCUTANEOUS
  Filled 2024-04-04: qty 0.12

## 2024-04-04 MED ORDER — INSULIN GLARGINE-YFGN 100 UNIT/ML ~~LOC~~ SOLN: 25.0000 [IU] | Freq: Once | SUBCUTANEOUS | Status: DC

## 2024-04-04 MED ORDER — INSULIN ASPART 100 UNIT/ML IJ SOLN
0.0000 [IU] | Freq: Every day | INTRAMUSCULAR | Status: DC
  Administered 2024-04-04 (×2): 3 [IU] via SUBCUTANEOUS
  Filled 2024-04-04: qty 1

## 2024-04-04 MED ORDER — CYANOCOBALAMIN 1000 MCG/ML IJ SOLN
1000.0000 ug | Freq: Once | INTRAMUSCULAR | Status: AC
  Administered 2024-04-04 (×2): 1000 ug via INTRAMUSCULAR
  Filled 2024-04-04: qty 1

## 2024-04-04 MED ORDER — DEXTROSE IN LACTATED RINGERS 5 % IV SOLN: INTRAVENOUS | Status: AC

## 2024-04-04 MED ORDER — INSULIN ASPART 100 UNIT/ML IJ SOLN
0.0000 [IU] | Freq: Three times a day (TID) | INTRAMUSCULAR | Status: DC
  Administered 2024-04-04 – 2024-04-05 (×6): 8 [IU] via SUBCUTANEOUS
  Filled 2024-04-04 (×3): qty 1

## 2024-04-04 NOTE — Plan of Care (Signed)
  Problem: Skin Integrity: Goal: Risk for impaired skin integrity will decrease Outcome: Progressing   Problem: Cardiac: Goal: Ability to maintain an adequate cardiac output will improve Outcome: Progressing   Problem: Respiratory: Goal: Will regain and/or maintain adequate ventilation Outcome: Progressing   Problem: Urinary Elimination: Goal: Ability to achieve and maintain adequate renal perfusion and functioning will improve Outcome: Progressing   Problem: Education: Goal: Knowledge of General Education information will improve Description: Including pain rating scale, medication(s)/side effects and non-pharmacologic comfort measures Outcome: Progressing   Problem: Coping: Goal: Ability to adjust to condition or change in health will improve Outcome: Not Progressing   Problem: Health Behavior/Discharge Planning: Goal: Ability to manage health-related needs will improve Outcome: Not Progressing

## 2024-04-04 NOTE — Evaluation (Addendum)
 Occupational Therapy Evaluation Patient Details Name: Tammy Acosta MRN: [id number] DOB: [birth year] Today's Date: 04/04/2024   History of Present Illness   Pt is a 49 female presented due to AMS, baseline independent with ADL's. Patient admitted for DKA and chronic AMS for months    PMH significant for type 2 diabetes mellitus, hypertension, dyslipidemia, depression and osteoarthritis     Clinical Impressions Pt is greeted in bed, oriented to self and place, grossly to situation. She reports she is indep in ADL/IADL PTA, including driving, amb with no AD. She has fair attention to task, requires cueing for sequencing. Pt presents with deficits in cognition, activity tolerance, and balance affecting safe and optimal ADL completion. She performed bed mobility with supervisions, STS with MIN A via HHA, amb with RW with CGA-MIN A (for navigating RW). Pt will benefit from skilled OT to address functional deficits and to facilitate optimal ADL performance. Would recommend supervision for ADL/IADL after discharge at this time due to current cognitive status. Will continue to assess.  Pt completed SLUMS examination this date scoring 10/30 indicating a *positive screen for dementia. Of note, it is not within occupational therapy scope of practice to diagnose cognitive impairments, this screen indicates need for further testing. The SLUMS is a 30 point, 11 question screening questionnaire that tests orientation, memory, attention, and executive function. Pt with noted impairments in short term memory, problem solving, and executive function limiting ability to perform ADL/IADL safely.   [facility] Mental Status Examination Orientation: 1/3  Calculations: 1/3 Naming animals: 1/3 Patient named 7 animals (0 points is 0-4 animals; 1 is 5-9 animals; 2 is 10-14 animals; 3 is 15+ animals) Recall: 1/5  Attention: 1/2 Clock drawing: 0/4 Visual Processing: 1/2 Paragraph Memory: 4/8  Total: 10/30;   Given that patient has some college level of education, this score falls in the dementia range.      If plan is discharge home, recommend the following:   A little help with bathing/dressing/bathroom;Supervision due to cognitive status;Direct supervision/assist for financial management;Direct supervision/assist for medications management;Assist for transportation;Assistance with cooking/housework     Functional Status Assessment   Patient has had a recent decline in their functional status and demonstrates the ability to make significant improvements in function in a reasonable and predictable amount of time.     Equipment Recommendations   BSC/3in1;Other (comment) (2WW)     Recommendations for Other Services         Precautions/Restrictions   Precautions Precautions: Fall Recall of Precautions/Restrictions: Impaired Restrictions Weight Bearing Restrictions Per Provider Order: No     Mobility Bed Mobility Overal bed mobility: Needs Assistance Bed Mobility: Supine to Sit     Supine to sit: Supervision          Transfers Overall transfer level: Needs assistance Equipment used: 1 person hand held assist Transfers: Sit to/from Stand Sit to Stand: Min assist                  Balance Overall balance assessment: Needs assistance Sitting-balance support: Feet supported Sitting balance-Leahy Scale: Good     Standing balance support: Single extremity supported Standing balance-Leahy Scale: Poor                             ADL either performed or assessed with clinical judgement   ADL Overall ADL's : Needs assistance/impaired Eating/Feeding: Set up   Grooming: Set up;Sitting  Upper Body Dressing : Minimal assistance;Sitting Upper Body Dressing Details (indicate cue type and reason): donn gown Lower Body Dressing: Minimal assistance Lower Body Dressing Details (indicate cue type and reason): anticipate Toilet Transfer:  Contact guard assist;Rolling walker (2 wheels);Ambulation;Minimal assistance Toilet Transfer Details (indicate cue type and reason): simulated, MIN A for RW technique         Functional mobility during ADLs: Contact guard assist;Minimal assistance;Rolling walker (2 wheels);Cueing for sequencing;Cueing for safety       Vision Baseline Vision/History: 1 Wears glasses Patient Visual Report: No change from baseline       Perception         Praxis         Pertinent Vitals/Pain Pain Assessment Pain Assessment: No/denies pain     Extremity/Trunk Assessment Upper Extremity Assessment Upper Extremity Assessment: Overall WFL for tasks assessed   Lower Extremity Assessment Lower Extremity Assessment: Overall WFL for tasks assessed       Communication Communication Communication: No apparent difficulties   Cognition Arousal: Alert Behavior During Therapy: Flat affect, WFL for tasks assessed/performed Cognition: Cognition impaired, No family/caregiver present to determine baseline   Orientation impairments: Time Awareness: Online awareness impaired Memory impairment (select all impairments): Declarative long-term memory, Working [employer] fast streamer, Short-term memory, Non-declarative long-term memory Attention impairment (select first level of impairment): Sustained attention Executive functioning impairment (select all impairments): Reasoning, Problem solving OT - Cognition Comments: Pt participates in SLUMS assessment- scores 10/30                 Following commands: Intact       Cueing  General Comments   Cueing Techniques: Verbal cues  no dizziness reported with mobility   Exercises Other Exercises Other Exercises: edu re: role of OT, role of rehab   Shoulder Instructions      Home Living Family/patient expects to be discharged to:: Private residence Living Arrangements: Alone Available Help at Discharge: Family;Available PRN/intermittently Type of Home:  Apartment Home Access: Level entry     Home Layout: One level               Home Equipment: None          Prior Functioning/Environment Prior Level of Function : Independent/Modified Independent;Driving;Patient poor historian/Family not available             Mobility Comments: patient reports independent without device. ADLs Comments: patient reports she is independent, drives, manages her own medications, and has grocery delivery    OT Problem List: Impaired balance (sitting and/or standing);Decreased cognition;Decreased activity tolerance;Decreased knowledge of use of DME or AE   OT Treatment/Interventions: Self-care/ADL training;DME and/or AE instruction;Therapeutic activities;Balance training;Therapeutic exercise;Cognitive remediation/compensation;Energy conservation;Patient/family education      OT Goals(Current goals can be found in the care plan section)   Acute Rehab OT Goals Patient Stated Goal: go home OT Goal Formulation: With patient Time For Goal Achievement: 04/18/24 Potential to Achieve Goals: Good ADL Goals Pt Will Perform Grooming: with modified independence;sitting;standing Pt Will Perform Lower Body Dressing: with modified independence;sitting/lateral leans;sit to/from stand Pt Will Transfer to Toilet: with modified independence;ambulating Pt Will Perform Toileting - Clothing Manipulation and hygiene: with modified independence;sitting/lateral leans;sit to/from stand Additional ADL Goal #1: Pt will particpate in further cognitive assessment via pill box test in order to optimize IADL performance   OT Frequency:  Min 2X/week    Co-evaluation PT/OT/SLP Co-Evaluation/Treatment: Yes Reason for Co-Treatment: Complexity of the patient's impairments (multi-system involvement) PT goals addressed during session: Mobility/safety with mobility OT goals  addressed during session: ADL's and self-care      AM-PAC OT 6 Clicks Daily Activity      Outcome Measure Help from another person eating meals?: None Help from another person taking care of personal grooming?: None Help from another person toileting, which includes using toliet, bedpan, or urinal?: None Help from another person bathing (including washing, rinsing, drying)?: A Little Help from another person to put on and taking off regular upper body clothing?: A Little Help from another person to put on and taking off regular lower body clothing?: A Little 6 Click Score: 21   End of Session Equipment Utilized During Treatment: Rolling walker (2 wheels);Gait belt Nurse Communication: Mobility status  Activity Tolerance: Patient tolerated treatment well Patient left: in chair;with call bell/phone within reach;with chair alarm set  OT Visit Diagnosis: Other abnormalities of gait and mobility (R26.89);Other symptoms and signs involving the nervous system (R29.898)                Time: 9089-9064 OT Time Calculation (min): 25 min Charges:  OT General Charges $OT Visit: 1 Visit OT Evaluation $OT Eval Moderate Complexity: 1 Mod  Therisa Sheffield, OTD OTR/L  04/04/24, 9:56 AM

## 2024-04-04 NOTE — Evaluation (Addendum)
 Physical Therapy Evaluation Patient Details Name: Tammy Acosta MRN: [id number] DOB: Jan 07, 1956 Today's Date: 04/04/2024  History of Present Illness  Pt is a 49 female presented due to AMS, baseline independent with ADL's. Patient admitted for DKA and chronic AMS for months    PMH significant for type 2 diabetes mellitus, hypertension, dyslipidemia, depression and osteoarthritis  Clinical Impression  Patient is agreeable to PT evaluation. She reports she is independent at baseline without device for ambulation, lives alone, and drives. No family is at the bedside, however daughter was here earlier visiting. The patient reports she does not typically need assistance but that her daughter could help at home if needed.  Today the patient required CGA- Min A for mobility today. She was unsteady with taking a few steps without an assistive device initially with standing. Improved standing balance and gait pattern using rolling walker. The patient reports she wants to go home. Anticipate patient will need at least intermittent supervision/assistance for safe transition home due to cognitive concerns. PT will continue to follow to maximize independence and decrease caregiver burden. May need to consider rehabilitation < 3 hours/day pending progress and caregiver support at home.     If plan is discharge home, recommend the following: A little help with walking and/or transfers;Help with stairs or ramp for entrance;Supervision due to cognitive status;Assist for transportation;Assistance with cooking/housework;Direct supervision/assist for medications management   Can travel by private vehicle        Equipment Recommendations Rolling walker (2 wheels)  Recommendations for Other Services       Functional Status Assessment Patient has had a recent decline in their functional status and demonstrates the ability to make significant improvements in function in a reasonable and predictable amount of time.      Precautions / Restrictions Precautions Precautions: Fall Recall of Precautions/Restrictions: Impaired Restrictions Weight Bearing Restrictions Per Provider Order: No      Mobility  Bed Mobility Overal bed mobility: Needs Assistance Bed Mobility: Supine to Sit     Supine to sit: Supervision          Transfers Overall transfer level: Needs assistance Equipment used: 1 person hand held assist Transfers: Sit to/from Stand Sit to Stand: Min assist           General transfer comment: lift assistance required    Ambulation/Gait Ambulation/Gait assistance: Min assist, Contact guard assist Gait Distance (Feet): 75 Feet Assistive device: Rolling walker (2 wheels), 1 person hand held assist Gait Pattern/deviations: Step-through pattern, Decreased stride length, Narrow base of support Gait velocity: decreased     General Gait Details: patient able to take several steps with unsteady gait, requiring Min A. recommended to use rolling walker with improving balance and gait pattern. occasional assistance for negotiation around obstacles with rolling walker. no dizziness reported with activity  Stairs            Wheelchair Mobility     Tilt Bed    Modified Rankin (Stroke Patients Only)       Balance Overall balance assessment: Needs assistance Sitting-balance support: Feet supported Sitting balance-Leahy Scale: Good     Standing balance support: Single extremity supported Standing balance-Leahy Scale: Poor Standing balance comment: unsteady without bilateral UE support with dynamic activity                             Pertinent Vitals/Pain Pain Assessment Pain Assessment: No/denies pain    Home Living Family/patient expects to  be discharged to:: Private residence Living Arrangements: Alone Available Help at Discharge: Family;Available PRN/intermittently Type of Home: Apartment Home Access: Level entry       Home Layout: One  level Home Equipment: None      Prior Function Prior Level of Function : Independent/Modified Independent;Driving;Patient poor historian/Family not available             Mobility Comments: patient reports independent without device. ADLs Comments: patient reports she is independent, drives, manages her own medications, and has grocery delivery     Extremity/Trunk Assessment   Upper Extremity Assessment Upper Extremity Assessment: Overall WFL for tasks assessed    Lower Extremity Assessment Lower Extremity Assessment: Overall WFL for tasks assessed       Communication   Communication Communication: No apparent difficulties    Cognition Arousal: Alert Behavior During Therapy: WFL for tasks assessed/performed   PT - Cognitive impairments: History of cognitive impairments, No family/caregiver present to determine baseline, Orientation, Awareness, Memory   Orientation impairments: Time                   PT - Cognition Comments: patient does not recall getting out of bed with staff yesterday. she is cooperative throughout session. see OT note for further cognitive assessment Following commands: Intact       Cueing Cueing Techniques: Verbal cues     General Comments General comments (skin integrity, edema, etc.): no dizziness reported with mobility    Exercises     Assessment/Plan    PT Assessment Patient needs continued PT services  PT Problem List Decreased strength;Decreased range of motion;Decreased activity tolerance;Decreased balance;Decreased mobility;Decreased safety awareness;Decreased knowledge of use of DME;Decreased cognition       PT Treatment Interventions DME instruction;Gait training;Stair training;Functional mobility training;Therapeutic exercise;Therapeutic activities;Balance training;Neuromuscular re-education;Cognitive remediation;Patient/family education    PT Goals (Current goals can be found in the Care Plan section)  Acute Rehab PT  Goals Patient Stated Goal: to go home with her cat PT Goal Formulation: With patient Time For Goal Achievement: 04/18/24 Potential to Achieve Goals: Fair    Frequency Min 2X/week     Co-evaluation PT/OT/SLP Co-Evaluation/Treatment: Yes Reason for Co-Treatment: Complexity of the patient's impairments (multi-system involvement) PT goals addressed during session: Mobility/safety with mobility OT goals addressed during session: ADL's and self-care       AM-PAC PT 6 Clicks Mobility  Outcome Measure Help needed turning from your back to your side while in a flat bed without using bedrails?: None Help needed moving from lying on your back to sitting on the side of a flat bed without using bedrails?: A Little Help needed moving to and from a bed to a chair (including a wheelchair)?: A Little Help needed standing up from a chair using your arms (e.g., wheelchair or bedside chair)?: A Little Help needed to walk in hospital room?: A Little Help needed climbing 3-5 steps with a railing? : A Lot 6 Click Score: 18    End of Session Equipment Utilized During Treatment: Gait belt Activity Tolerance: Patient tolerated treatment well Patient left: in chair;with call bell/phone within reach;with chair alarm set (OT in the room) Nurse Communication: Mobility status PT Visit Diagnosis: Muscle weakness (generalized) (M62.81);Unsteadiness on feet (R26.81)    Time: 9089-9074 PT Time Calculation (min) (ACUTE ONLY): 15 min   Charges:   PT Evaluation $PT Eval Moderate Complexity: 1 Mod   PT General Charges $$ ACUTE PT VISIT: 1 Visit         Tammy Acosta,  PT, MPT   Tammy Acosta 04/04/2024, 10:16 AM

## 2024-04-04 NOTE — Progress Notes (Signed)
 Insulin  drip stopped 2 hours after long acting. No issues noted. Will continue to monitor.

## 2024-04-04 NOTE — Care Management Important Message (Signed)
 Important Message  Patient Details  Name: Tammy Acosta MRN: [id number] Date of Birth: 1956/06/26   Important Message Given:  Yes - Medicare IM     [name] 04/04/2024, 10:53 AM

## 2024-04-04 NOTE — Progress Notes (Signed)
 Endotool alert that her glucose is stable so I should contact the provider for transition orders. I just checked CBG is now 134. Dr. Cleatus notified and received transition orders. Will implement and continue to monitor.

## 2024-04-04 NOTE — TOC Initial Note (Signed)
 Transition of Care [facility]) - Initial/Assessment Note    Patient Details  Name: Tammy Acosta MRN: [id number] Date of Birth: 1955-10-27  Transition of Care [name]) CM/SW Contact:    Tammy ONEIDA Haddock, RN Phone Number: 04/04/2024, 4:33 PM  Clinical Narrative:                  Per MD patient A&O x2, and need to discuss disposition with daughter  Call placed to daughter Tammy.  She states that patient lives at home alone, if forgetful and non compliant with her medication Tammy inquires about placement options or care givers in the home.  Reviewed options with daughter, which are all an out of pocket expense.  She states that she was told patient is not eligible for Medicaid due to her income.    Daughter in agreement to home health services.  She states she does not have a preference of agency.  Referral made to Shaun with [facility] for RN PT OT SW and aide Referral for Mitch with [employer] for RW and [facility] to be delivered to room.  Daughter states that she is working with someone privately to arrange some caregiver hours in the home.  Daughter states that she will be transporting patient at discharge, and she and patient's 2 granddaughter will check on her frequently        Patient Goals and CMS Choice            Expected Discharge Plan and Services                                              Prior Living Arrangements/Services                       Activities of Daily Living   ADL Screening (condition at time of admission) Independently performs ADLs?: Yes (appropriate for developmental age) Is the patient deaf or have difficulty hearing?: No Does the patient have difficulty seeing, even when wearing glasses/contacts?: No Does the patient have difficulty concentrating, remembering, or making decisions?: Yes  Permission Sought/Granted                  Emotional Assessment              Admission diagnosis:  Dehydration  [E86.0] Acute kidney injury (HCC) [N17.9] DKA, type 2 (HCC) [E11.10] Altered mental status, unspecified altered mental status type [R41.82] Diabetic ketoacidosis without coma associated with type 2 diabetes mellitus (HCC) [E11.10] Patient Active Problem List   Diagnosis Date Noted   DKA, type 2 (HCC) 04/03/2024   Essential hypertension 04/03/2024   Dyslipidemia 04/03/2024   Falls 09/08/2023   Conjunctivitis 06/23/2023   Bilateral lower extremity edema 06/23/2023   Unexplained weight loss 05/07/2023   Iritis 12/26/2021   Balance problem 12/26/2021   Acute bilateral low back pain without sciatica 11/29/2019   Sensation of gaseous abdominal fullness 11/29/2019   Trigeminal neuralgia 06/22/2019   Suspected COVID-19 virus infection 06/22/2019   Stomach abnormality 06/22/2019   Left shoulder pain 11/02/2018   Family history of colon cancer 11/02/2018   Acute pain of right knee 02/22/2018   Atherosclerosis of aorta (HCC) 01/18/2018   Elevated lipase 01/09/2018   Allergic rhinitis 01/09/2018   Nodule of finger 10/15/2017   Seizure (HCC) 07/20/2017   Fatigue 11/06/2016   Chest pain 01/02/2016  Nicotine  dependence, cigarettes, uncomplicated 10/30/2015   Hypomagnesemia 10/22/2015   Osteoarthritis of hip (Location of Tertiary source of pain) (Bilateral) (L>R) 10/21/2015   Diabetic peripheral neuropathy (HCC) 10/21/2015   Chronic low back pain (Location of Primary Source of Pain) (Bilateral) (L>R) 06/18/2015   Essential (primary) hypertension 05/21/2015   Hyperlipidemia 02/13/2014   Anxiety and depression 02/04/2014   Type 2 diabetes mellitus (HCC) 02/04/2014   PCP:  System, Provider Not In Pharmacy:   [id number] - [place], [place] - [address] AT [facility] [address] [place] [address] Phone: [phone] Fax: [phone]  [facility] - [place], [place] - [address] [place]  [address] Phone: [phone] Fax: [phone]     Social Drivers of Health (SDOH) Social History: SDOH Screenings   Food Insecurity: No Food Insecurity (03/13/2024)  Housing: Unknown (03/13/2024)  Transportation Needs: No Transportation Needs (03/13/2024)  Utilities: Not At Risk (03/13/2024)  Alcohol Screen: Low Risk  (03/13/2024)  Depression (PHQ2-9): Low Risk  (03/13/2024)  Financial Resource Strain: Low Risk  (03/13/2024)  Physical Activity: Insufficiently Active (03/13/2024)  Social Connections: Socially Isolated (03/13/2024)  Stress: No Stress Concern Present (03/13/2024)  Tobacco Use: High Risk (03/13/2024)  Health Literacy: Adequate Health Literacy (03/13/2024)   SDOH Interventions:     Readmission Risk Interventions     No data to display

## 2024-04-04 NOTE — Progress Notes (Signed)
 Patient is not able to walk the distance required to go the bathroom, or he/she is unable to safely negotiate stairs required to access the bathroom.  A 3in1 BSC will alleviate this problem

## 2024-04-04 NOTE — Progress Notes (Addendum)
 PROGRESS NOTE    Tammy Acosta  FMW:[id number] DOB: 26-Oct-1955 DOA: 04/02/2024 PCP: System, Provider Not In  Chief Complaint  Patient presents with   Altered Mental Status   Diabetic Ketoacidosis    Hospital Course:  Tammy Acosta is a 68 y.o. female with medical history significant for type 2 diabetes mellitus, hypertension, dyslipidemia, depression and osteoarthritis presented due to AMS, baseline independent with ADL's. Patient admitted for DKA and chronic AMS for months.   It looks like patient has not been taking medication for months now and unsafe to live alone, discussed options with daughter. Would like to take home tomorrow with home services Anticipate discharge tomorrow after adjustment of Insulin  regimen and Home services set up  Subjective: Patient was examined at the bedside. Very pleasant, confused but better than yesterday AO x2, not oriented to time - states its 2078 and she is 67 years old Discussed with daughter Tammy Acosta over the phone and provided updates   Objective: Vitals:   04/04/24 1000 04/04/24 1100 04/04/24 1200 04/04/24 1327  BP: (!) 102/55 120/62 (!) 137/100 111/66  Pulse: 79 77 89 85  Resp: 13 20  18   Temp:    98 F (36.7 C)  TempSrc:    Oral  SpO2: 100% 100% 100% 98%  Weight:      Height:        Intake/Output Summary (Last 24 hours) at 04/04/2024 1808 Last data filed at 04/04/2024 0400 Gross per 24 hour  Intake 1697.52 ml  Output 650 ml  Net 1047.52 ml   Filed Weights   04/02/24 2207 04/03/24 1311  Weight: 49.9 kg 50.1 kg    Examination: GENERAL: no acute distress NECK:  Supple, no jugular venous distention. No thyroid  enlargement, no tenderness.  LUNGS: Normal breath sounds bilaterally, no wheezing, rales,rhonchi or crepitation. No use of accessory muscles of respiration.  CARDIOVASCULAR: Regular rate and rhythm, S1, S2 normal. No murmurs, rubs, or gallops.  ABDOMEN: Soft, nondistended, nontender. Bowel sounds present. No  organomegaly or mass.  EXTREMITIES: No pedal edema, cyanosis, or clubbing.  NEUROLOGIC: AO x 2, pleasantly confused, not oriented to time, Cranial nerves II through XII are intact. Muscle strength 5/5 in all extremities. Sensation intact. Gait not checked.  SKIN: No obvious rash, lesion, or ulce  Assessment & Plan:  Principal Problem:   DKA, type 2 (HCC) Active Problems:   Essential hypertension   Dyslipidemia   Diabetic peripheral neuropathy (HCC)   Anxiety and depression   DKA - resolved DM type 2 - On presentation VBG pH 7.22, Bicarb 11, AG 21 - Home regimen: Metformin , Lantus  59 units daily, likely non complaint due to ongoing memory issues - HbA1c > 15.5 - s/p Insulin  gtt, DKA resolved received total 37u Lantus  today - Infectious work up negative - UA and CXR - On Lantus  40 units daily, SSI  Hypophosphatemia Hypomagnesemia - Monitor and replete as needed  Chronic AMS Suspect Dementia - as per daughter Tammy Acosta has been having memory issues ongoing for quite sometime (~ 1 year) and gradually worsening, Suspect if she has worsening dementia - CT head negative for acute changes - MRI brain negative - TSH, Ammonia normal, B12 borderline low - replete with daily B12 supplements - OT slums - 10/30 - Discussed with daughter that patient cannot take care of herself as lives independently, Discussed options of having caregiver vs placement. Daughter decided to take her home tomorrow and check on her with home services - to have a safe discharge  plan - Home PT/OT/RN/HHA/SW as discharge, TOC consulted  AKI - resolved - Cr 1.26 -> 0.79 s/p IV fluids   Essential hypertension - Hold Amlodipine  5mg , Lisinopril  40mg  due to low BP   Dyslipidemia - statin therapy and Zetia    Diabetic peripheral neuropathy (HCC) - Neurontin .   Anxiety and depression - Zoloft .  Incidental findings: displaced transverse fracture through the right humeral neck with appears new from 2013 and is  favored to be chronic  DVT prophylaxis: Lovenox  SQ   Code Status: Do not attempt resuscitation (DNR) PRE-ARREST INTERVENTIONS DESIRED Disposition:  TBD  Consultants:  None  Procedures:  None  Antimicrobials:  Anti-infectives (From admission, onward)    None       Data Reviewed: I have personally reviewed following labs and imaging studies CBC: Recent Labs  Lab 04/02/24 2217 04/03/24 0423 04/04/24 0541  WBC 8.8 7.9 4.1  NEUTROABS 5.8  --   --   HGB 15.7* 13.8 13.4  HCT 47.2* 40.2 39.1  MCV 87.2 85.4 85.6  PLT 247 226 192   Basic Metabolic Panel: Recent Labs  Lab 04/03/24 0929 04/03/24 1524 04/03/24 1639 04/03/24 2005 04/04/24 0033 04/04/24 0541 04/04/24 1614  NA 133*   < > 141 138 139 140 136  K 4.4   < > 3.2* 3.1* 3.7 4.1 4.1  CL 102   < > 108 108 109 108 103  CO2 15*   < > 20* 20* 24 23 23   GLUCOSE 341*   < > 132* 232* 123* 228* 239*  BUN 21   < > 16 15 14 12 19   CREATININE 0.71   < > 0.63 0.65 0.73 0.62 0.94  CALCIUM  8.7*   < > 8.9 8.7* 8.5* 8.5* 8.1*  MG 1.6*  --  2.1 2.0 1.9 1.8  --   PHOS 2.3*  --  2.0* 2.1* 2.8 3.0  --    < > = values in this interval not displayed.   GFR: Estimated Creatinine Clearance: 44.7 mL/min (by C-G formula based on SCr of 0.94 mg/dL). Liver Function Tests: Recent Labs  Lab 04/02/24 2217  AST 20  ALT 19  ALKPHOS 77  BILITOT 1.6*  PROT 7.7  ALBUMIN 3.7   CBG: Recent Labs  Lab 04/04/24 0225 04/04/24 0906 04/04/24 1139 04/04/24 1413 04/04/24 1619  GLUCAP 140* 281* 354* 284* 263*    Recent Results (from the past 240 hours)  MRSA Next Gen by PCR, Nasal     Status: None   Collection Time: 04/03/24  1:16 PM   Specimen: Nasal Mucosa; Nasal Swab  Result Value Ref Range Status   MRSA by PCR Next Gen NOT DETECTED NOT DETECTED Final    Comment: (NOTE) The GeneXpert MRSA Assay (FDA approved for NASAL specimens only), is one component of a comprehensive MRSA colonization surveillance program. It is not intended  to diagnose MRSA infection nor to guide or monitor treatment for MRSA infections. Test performance is not FDA approved in patients less than 37 years old. Performed at [facility], [address] [place], [place] [address]      Radiology Studies: MR BRAIN WO CONTRAST Result Date: 04/04/2024 EXAM: MRI BRAIN WITHOUT CONTRAST 04/04/2024 08:26:00 AM TECHNIQUE: Multiplanar multisequence MRI of the head/brain was performed without the administration of intravenous contrast. COMPARISON: CT of the head dated 04/02/2024. CLINICAL HISTORY: Mental status change, unknown cause. Altered mental status; Best images sent, AMS. Unable to hold still, unable to obtain cor t2. FINDINGS: BRAIN AND VENTRICLES:  No acute infarct. No intracranial hemorrhage. No mass. No midline shift. No hydrocephalus. The sella is unremarkable. Normal flow voids. Age-related cerebral volume loss and mild-to-moderate cerebral white matter disease. ORBITS: No acute abnormality. SINUSES AND MASTOIDS: No acute abnormality. BONES AND SOFT TISSUES: Normal marrow signal. No acute soft tissue abnormality. IMPRESSION: 1. No acute intracranial abnormality. 2. Age-related cerebral volume loss and mild-to-moderate cerebral white matter disease. Electronically signed by: evalene coho 04/04/2024 09:00 AM EDT RP Workstation: [id number]   CT Head Wo Contrast Result Date: 04/02/2024 CLINICAL DATA:  Mental status change, unknown cause EXAM: CT HEAD WITHOUT CONTRAST TECHNIQUE: Contiguous axial images were obtained from the base of the skull through the vertex without intravenous contrast. RADIATION DOSE REDUCTION: This exam was performed according to the departmental dose-optimization program which includes automated exposure control, adjustment of the mA and/or kV according to patient size and/or use of iterative reconstruction technique. COMPARISON:  CT head 12/10/2019 FINDINGS: Brain: No evidence of large-territorial acute infarction. No  parenchymal hemorrhage. No mass lesion. No extra-axial collection. No mass effect or midline shift. No hydrocephalus. Basilar cisterns are patent. Vascular: No hyperdense vessel. Atherosclerotic calcifications are present within the cavernous internal carotid and vertebral arteries. Skull: No acute fracture or focal lesion. Sinuses/Orbits: Paranasal sinuses and mastoid air cells are clear. The orbits are unremarkable. Other: None. IMPRESSION: No acute intracranial abnormality. Electronically Signed   By: Morgane  Naveau M.D.   On: 04/02/2024 23:18   DG Chest 1 View Result Date: 04/02/2024 CLINICAL DATA:  Altered mental status EXAM: CHEST  1 VIEW COMPARISON:  Chest x-ray 08/11/2012 FINDINGS: The heart size and mediastinal contours are within normal limits. Both lungs are clear. Left shoulder arthroplasty and thoraco lumbar fusion hardware is partially visualized. Displaced transverse fracture through the right humeral neck appears new from 2013 and is favored as chronic. IMPRESSION: 1. No active disease. 2. Displaced transverse fracture through the right humeral neck appears new from 2013 and is favored as chronic. Correlate clinically. Electronically Signed   By: Greig Pique M.D.   On: 04/02/2024 22:45    Scheduled Meds:  Chlorhexidine  Gluconate Cloth  6 each Topical Daily   [START ON 04/05/2024] vitamin B-12  1,000 mcg Oral Daily   enoxaparin  (LOVENOX ) injection  40 mg Subcutaneous Q24H   ezetimibe   10 mg Oral Daily   gabapentin   800 mg Oral QID   insulin  aspart  0-15 Units Subcutaneous TID WC   insulin  aspart  0-5 Units Subcutaneous QHS   [START ON 04/05/2024] insulin  glargine-yfgn  40 Units Subcutaneous Q24H   phosphorus  500 mg Oral BID   rosuvastatin   40 mg Oral Daily   sertraline   200 mg Oral Daily   Continuous Infusions:     LOS: 1 day  MDM: Patient is high risk for one or more organ failure.  They necessitate ongoing hospitalization for continued IV therapies and subsequent lab  monitoring. Total time spent interpreting labs and vitals, reviewing the medical record, coordinating care amongst consultants and care team members, directly assessing and discussing care with the patient and/or family: 55 min  Laree Lock, MD [facility]  To contact the attending physician between 7A-7P please use Epic Chat. To contact the covering physician during after hours 7P-7A, please review Amion.  04/04/2024, 6:08 PM   *This document has been created with the assistance of dictation software. Please excuse typographical errors. *

## 2024-04-05 DIAGNOSIS — E1110 Type 2 diabetes mellitus with ketoacidosis without coma: Secondary | ICD-10-CM | POA: Diagnosis not present

## 2024-04-05 LAB — BASIC METABOLIC PANEL WITH GFR
Anion gap: 9 (ref 5–15)
BUN: 14 mg/dL (ref 8–23)
CO2: 29 mmol/L (ref 22–32)
Calcium: 8.1 mg/dL — ABNORMAL LOW (ref 8.9–10.3)
Chloride: 103 mmol/L (ref 98–111)
Creatinine, Ser: 0.57 mg/dL (ref 0.44–1.00)
GFR, Estimated: >60 mL/min (ref >60)
Glucose, Bld: 239 mg/dL — ABNORMAL HIGH (ref 70–99)
Potassium: 3.9 mmol/L (ref 3.5–5.1)
Sodium: 141 mmol/L (ref 135–145)

## 2024-04-05 LAB — MAGNESIUM: Magnesium: 1.8 mg/dL (ref 1.7–2.4)

## 2024-04-05 LAB — GLUCOSE, CAPILLARY
Glucose-Capillary: 278 mg/dL — ABNORMAL HIGH (ref 70–99)
Glucose-Capillary: 275 mg/dL — ABNORMAL HIGH (ref 70–99)

## 2024-04-05 LAB — PHOSPHORUS: Phosphorus: 3.8 mg/dL (ref 2.5–4.6)

## 2024-04-05 MED ORDER — LANTUS SOLOSTAR 100 UNIT/ML ~~LOC~~ SOPN: 59.0000 [IU] | PEN_INJECTOR | Freq: Every day | SUBCUTANEOUS | 2 refills | Status: DC

## 2024-04-05 MED ORDER — CYANOCOBALAMIN 1000 MCG PO TABS: 1000.0000 ug | ORAL_TABLET | Freq: Every day | ORAL | Status: AC

## 2024-04-05 MED ORDER — LANTUS SOLOSTAR 100 UNIT/ML ~~LOC~~ SOPN: 40.0000 [IU] | PEN_INJECTOR | Freq: Every day | SUBCUTANEOUS | 2 refills | Status: DC

## 2024-04-05 NOTE — Plan of Care (Signed)
  Problem: Education: Goal: Ability to describe self-care measures that may prevent or decrease complications (Diabetes Survival Skills Education) will improve Outcome: Progressing Goal: Individualized Educational Video(s) Outcome: Progressing   Problem: Coping: Goal: Ability to adjust to condition or change in health will improve Outcome: Progressing   Problem: Fluid Volume: Goal: Ability to maintain a balanced intake and output will improve Outcome: Progressing   Problem: Health Behavior/Discharge Planning: Goal: Ability to identify and utilize available resources and services will improve Outcome: Progressing Goal: Ability to manage health-related needs will improve Outcome: Progressing   Problem: Metabolic: Goal: Ability to maintain appropriate glucose levels will improve Outcome: Progressing   Problem: Nutritional: Goal: Maintenance of adequate nutrition will improve Outcome: Progressing Goal: Progress toward achieving an optimal weight will improve Outcome: Progressing   Problem: Skin Integrity: Goal: Risk for impaired skin integrity will decrease Outcome: Progressing   Problem: Education: Goal: Ability to describe self-care measures that may prevent or decrease complications (Diabetes Survival Skills Education) will improve Outcome: Progressing Goal: Individualized Educational Video(s) Outcome: Progressing   Problem: Health Behavior/Discharge Planning: Goal: Ability to identify and utilize available resources and services will improve Outcome: Progressing Goal: Ability to manage health-related needs will improve Outcome: Progressing   Problem: Cardiac: Goal: Ability to maintain an adequate cardiac output will improve Outcome: Progressing   Problem: Nutritional: Goal: Maintenance of adequate nutrition will improve Outcome: Progressing Goal: Maintenance of adequate weight for body size and type will improve Outcome: Progressing   Problem: Metabolic: Goal:  Ability to maintain appropriate glucose levels will improve Outcome: Progressing

## 2024-04-05 NOTE — Progress Notes (Signed)
 Occupational Therapy Treatment Patient Details Name: Tammy Acosta MRN: [id number] DOB: July 04, 1956 Today's Date: 04/05/2024   History of present illness Pt is a 84 female presented due to AMS, baseline independent with ADL's. Patient admitted for DKA and chronic AMS for months    PMH significant for type 2 diabetes mellitus, hypertension, dyslipidemia, depression and osteoarthritis   OT comments  Chart reviewed to date, pt greeted in bed, agreeable to OT tx session targeting improving functional activity tolerance in prep for ADL tasks. Pt is oriented to self, place, situation on this date, improved situational awareness however continues appear impaired. Improvements noted in overall mobility, pt reports she feels more stable with RW. Pt is making progress towards goals, discharge recommendation remains appropriate .      If plan is discharge home, recommend the following:  A little help with bathing/dressing/bathroom;Supervision due to cognitive status;Direct supervision/assist for financial management;Direct supervision/assist for medications management;Assist for transportation;Assistance with cooking/housework   Equipment Recommendations  BSC/3in1;Other (comment) (2WW)    Recommendations for Other Services      Precautions / Restrictions Precautions Precautions: Fall Recall of Precautions/Restrictions: Intact Restrictions Weight Bearing Restrictions Per Provider Order: No       Mobility Bed Mobility Overal bed mobility: Modified Independent                  Transfers Overall transfer level: Needs assistance   Transfers: Sit to/from Stand Sit to Stand: Supervision                 Balance Overall balance assessment: Needs assistance Sitting-balance support: Feet supported Sitting balance-Leahy Scale: Normal     Standing balance support: Bilateral upper extremity supported, During functional activity, Reliant on assistive device for balance Standing  balance-Leahy Scale: Good                             ADL either performed or assessed with clinical judgement   ADL Overall ADL's : Needs assistance/impaired Eating/Feeding: Set up                   Lower Body Dressing: Supervision/safety   Toilet Transfer: Supervision/safety;Ambulation;Rolling walker (2 wheels);Regular Toilet   Toileting- Clothing Manipulation and Hygiene: Supervision/safety       Functional mobility during ADLs: Supervision/safety;Rolling walker (2 wheels) (approx 200' with RW, approrpiately navigates around obstacles with no vcs required)      Extremity/Trunk Assessment              Vision       Perception     Praxis     Communication Communication Communication: No apparent difficulties   Cognition Arousal: Alert Behavior During Therapy: Flat affect, WFL for tasks assessed/performed Cognition: Cognition impaired   Orientation impairments: Time Awareness: Online awareness impaired   Attention impairment (select first level of impairment): Selective attention Executive functioning impairment (select all impairments): Reasoning, Problem solving OT - Cognition Comments: improved awareness- reports she wont drive and her daugther is going to help her with meds                 Following commands: Intact        Cueing   Cueing Techniques: Verbal cues  Exercises Other Exercises Other Exercises: edu re: role of OT, safe ADL completion with DME/AE    Shoulder Instructions       General Comments      Pertinent Vitals/ Pain  Pain Assessment Pain Assessment: No/denies pain  Home Living                                          Prior Functioning/Environment              Frequency  Min 2X/week        Progress Toward Goals  OT Goals(current goals can now be found in the care plan section)  Progress towards OT goals: Progressing toward goals  Acute Rehab OT Goals Time For  Goal Achievement: 04/18/24  Plan      Co-evaluation                 AM-PAC OT 6 Clicks Daily Activity     Outcome Measure   Help from another person eating meals?: None Help from another person taking care of personal grooming?: None Help from another person toileting, which includes using toliet, bedpan, or urinal?: None Help from another person bathing (including washing, rinsing, drying)?: A Little Help from another person to put on and taking off regular upper body clothing?: None Help from another person to put on and taking off regular lower body clothing?: A Little 6 Click Score: 22    End of Session Equipment Utilized During Treatment: Rolling walker (2 wheels)  OT Visit Diagnosis: Other abnormalities of gait and mobility (R26.89);Other symptoms and signs involving the nervous system (R29.898)   Activity Tolerance Patient tolerated treatment well   Patient Left in bed;with call bell/phone within reach;with bed alarm set   Nurse Communication Mobility status        Time: 1040-1050 OT Time Calculation (min): 10 min  Charges: OT General Charges $OT Visit: 1 Visit OT Treatments $Therapeutic Activity: 8-22 mins Therisa Sheffield, OTD OTR/L  04/05/24, 11:00 AM

## 2024-04-05 NOTE — Inpatient Diabetes Management (Addendum)
 Inpatient Diabetes Program Recommendations  AACE/ADA: New Consensus Statement on Inpatient Glycemic Control (2015)  Target Ranges:  Prepandial:   less than 140 mg/dL      Peak postprandial:   less than 180 mg/dL (1-2 hours)      Critically ill patients:  140 - 180 mg/dL    Latest Reference Range & Units 04/03/24 04:23  Hemoglobin A1C 4.8 - 5.6 % >15.5 (H)  >398 mg/dl  (H): Data is abnormally high  Latest Reference Range & Units 04/04/24 09:06 04/04/24 11:39 04/04/24 14:13 04/04/24 16:19 04/04/24 21:08  Glucose-Capillary 70 - 99 mg/dL 718 (H)  5 units Novolog   25 units Semglee  @1011  354 (H)  9 units Novolog   284 (H) 263 (H)  8 units Novolog   288 (H)  3 units Novolog    (H): Data is abnormally high  Admit with:  AMS/ DKA Per her daughter the patient has been refusing to take her medications, to get out of bed and refusing to eat   History: DM2  Home DM Meds: Metformin  1000 mg BID       Lantus  59 units daily       Freestyle Libre 3 CGM  Current Orders: Semglee  40 units Q24H      Novolog  Moderate Correction Scale/ SSI (0-15 units) TID AC + HS     Looks like pt will be going home with assistance Daughter [name] to assist   Met w/ pt's daughter [name] prior to d/c.  Reviewed discharge diabetes medications with Dtr.  Explained what Lantus  insulin  is, how it works, when to give, side effects, Hypoglycemia treatment, importance of 24H timing, discharge dose.  Educated patient's Dtr on insulin  pen use at home.  Reviewed all steps of insulin  pen including attachment of needle, 2-unit air shot, dialing up dose, giving injection, rotation of injection sites, removing needle, disposal of sharps, storage of unused insulin , disposal of insulin  etc.  Patient's Dtr able to provide successful return demonstration.  Reviewed troubleshooting with insulin  pen.  Dtr made aware that she can buy more pen needles OTC at any local pharmacy if pt runs out or can request refills for Insulin   or pen needles through the pharmacy or MD office.  Reviewed Freestyle Libre 3 Plus CGM system with Dtr (how to use, how to place new sensor, sensor life, troubleshooting, warm-up time, how to use reader at home, rotation of insertion sites, Vitamin C warning, etc).  Per Pt, pt has FSL3 reader at home in a drawer--explained to Dtr that reader may need to be charged if battery dead or low--Reader should have charging cable with it.  Pt's Dtr knows they will able to check reader for glucose readings 1hr after sensor placed.  Pt's Dtr instructed to replace sensor in 15 days and use opposite arm.  Encouraged Dtr to open app and look at sensor readings pre and post meals.  Reviewed healthy glucose goals for home.  Pt's Dtr asked to seek refills for the sensors from their PCP.  Pt's Dtr educated to check fingerstick CBG with traditional CBG meter when glucose reading does not match how they feels.      --Will follow patient during hospitalization--  Adina Rudolpho Arrow RN, MSN, CDCES Diabetes Coordinator Inpatient Glycemic Control Team Team Pager: [phone] (8a-5p)

## 2024-04-05 NOTE — Discharge Summary (Signed)
 Physician Discharge Summary   Tammy Acosta  female DOB: [birth year]  FMW:[id number]  PCP: System, Provider Not In  Admit date: 04/02/2024 Discharge date: 04/05/2024  Admitted From: home Disposition:  home Home Health: Yes CODE STATUS: DNR  Discharge Instructions     Diet Carb Modified   Complete by: As directed       Hospital Course:  For full details, please see H&P, progress notes, consult notes and ancillary notes.  Briefly,  Tammy Acosta is a 68 y.o. female with medical history significant for type 2 diabetes mellitus, hypertension, presented due to AMS.    Baseline independent with ADL's. Patient admitted for DKA and AMS for months.   DKA - resolved DM type 2 - On presentation VBG pH 7.22, Bicarb 11, AG 21 - Home regimen: Metformin , Lantus  59 units daily, likely non complaint due to ongoing memory issues - HbA1c > 15.5 - s/p Insulin  gtt, DKA resolved received. - Infectious work up negative - UA and CXR --discharged on 40u Lantus , per diabetic coordinator recommendation.   Hypophosphatemia Hypomagnesemia - Monitored and supplemented as needed   Chronic AMS Suspect Dementia - as per daughter, pt has been having memory issues ongoing for quite sometime (~ 1 year) and gradually worsening, Suspect worsening dementia - CT head negative for acute changes - MRI brain negative - TSH, Ammonia normal, B12 borderline low - supplemented with IM Vit B12 and discharged on oral vit B12 supplement. - Discussed with daughter that patient cannot take care of herself to live independently.  Daughter decided to take pt home, and family will help take care of pt.  AKI - resolved - Cr 1.26 -> 0.79 s/p IV fluids   Essential hypertension - d/c'ed home amlodipine  and Lisinopril  due to low BP   Dyslipidemia - statin therapy and Zetia    Diabetic peripheral neuropathy (HCC) - Neurontin .   Anxiety and depression - Zoloft .   Incidental findings: displaced transverse  fracture through the right humeral neck with appears new from 2013 and is favored to be chronic   Unless noted above, medications under STOP list are ones pt was not taking PTA.  Discharge Diagnoses:  Principal Problem:   DKA, type 2 (HCC) Active Problems:   Essential hypertension   Dyslipidemia   Diabetic peripheral neuropathy (HCC)   Anxiety and depression   30 Day Unplanned Readmission Risk Score    Flowsheet Row ED to Hosp-Admission (Current) from 04/02/2024 in [facility]  30 Day Unplanned Readmission Risk Score (%) 13.36 Filed at 04/05/2024 0801    This score is the patient's risk of an unplanned readmission within 30 days of being discharged (0 -100%). The score is based on dignosis, age, lab data, medications, orders, and past utilization.   Low:  0-14.9   Medium: 15-21.9   High: 22-29.9   Extreme: 30 and above         Discharge Instructions:  Allergies as of 04/05/2024       Reactions   Penicillins Anaphylaxis, Hives   Meloxicam     Tetracyclines & Related Rash        Medication List     STOP taking these medications    amLODipine  5 MG tablet Commonly known as: NORVASC    lisinopril  40 MG tablet Commonly known as: ZESTRIL    tiZANidine  4 MG tablet Commonly known as: ZANAFLEX        TAKE these medications    blood glucose meter kit and supplies Dispense One touch  meter.  E11.9   cyanocobalamin  1000 MCG tablet Take 1 tablet (1,000 mcg total) by mouth daily. Start taking on: April 06, 2024   ezetimibe  10 MG tablet Commonly known as: ZETIA  TAKE 1 TABLET(10 MG) BY MOUTH DAILY   FreeStyle Libre 3 Reader Devi 1 each by Does not apply route daily. Use to check glucose continuously.   FreeStyle Libre 3 Sensor Misc Place 1 sensor on the skin every 14 days. Use to check glucose continuously   gabapentin  800 MG tablet Commonly known as: NEURONTIN  TAKE 1 TABLET(800 MG) BY MOUTH FOUR TIMES DAILY   Lantus   SoloStar 100 UNIT/ML Solostar Pen Generic drug: insulin  glargine Inject 40 Units into the skin daily. What changed: how much to take   metFORMIN  1000 MG tablet Commonly known as: GLUCOPHAGE  TAKE 1 TABLET(1000 MG) BY MOUTH TWICE DAILY WITH A MEAL   ONE TOUCH ULTRA 2 w/Device Kit Dispense 1 meter to use to test blood glucose once daily. Dx code: E11.42.   OneTouch Delica Plus Lancet33G Misc USE   TO CHECK GLUCOSE UP TO THREE TIMES DAILY AS DIRECTED   OneTouch Ultra test strip Generic drug: glucose blood USE 1 STRIP TO CHECK GLUCOSE UP TO THREE TIMES DAILY AS DIRECTED.   Pen Needles 32G X 5 MM Misc 1 pen by Does not apply route daily. Use to inject insulin  daily   rosuvastatin  40 MG tablet Commonly known as: CRESTOR  TAKE 1 TABLET(40 MG) BY MOUTH DAILY   sertraline  100 MG tablet Commonly known as: ZOLOFT  Take 2 tablets (200 mg total) by mouth daily.               Durable Medical Equipment  (From admission, onward)           Start     Ordered   04/04/24 1514  For home use only DME Walker rolling  Once       Question Answer Comment  Walker: With 5 Inch Wheels   Patient needs a walker to treat with the following condition Weakness      04/04/24 1513   04/04/24 1513  For home use only DME Bedside commode  Once       Question:  Patient needs a bedside commode to treat with the following condition  Answer:  Weakness   04/04/24 1513             Follow-up Information     Your PCP Follow up in 1 week(s).                  Allergies  Allergen Reactions   Penicillins Anaphylaxis and Hives   Meloxicam     Tetracyclines & Related Rash     The results of significant diagnostics from this hospitalization (including imaging, microbiology, ancillary and laboratory) are listed below for reference.   Consultations:   Procedures/Studies: MR BRAIN WO CONTRAST Result Date: 04/04/2024 EXAM: MRI BRAIN WITHOUT CONTRAST 04/04/2024 08:26:00 AM TECHNIQUE:  Multiplanar multisequence MRI of the head/brain was performed without the administration of intravenous contrast. COMPARISON: CT of the head dated 04/02/2024. CLINICAL HISTORY: Mental status change, unknown cause. Altered mental status; Best images sent, AMS. Unable to hold still, unable to obtain cor t2. FINDINGS: BRAIN AND VENTRICLES: No acute infarct. No intracranial hemorrhage. No mass. No midline shift. No hydrocephalus. The sella is unremarkable. Normal flow voids. Age-related cerebral volume loss and mild-to-moderate cerebral white matter disease. ORBITS: No acute abnormality. SINUSES AND MASTOIDS: No acute abnormality. BONES AND SOFT TISSUES: Normal marrow  signal. No acute soft tissue abnormality. IMPRESSION: 1. No acute intracranial abnormality. 2. Age-related cerebral volume loss and mild-to-moderate cerebral white matter disease. Electronically signed by: evalene coho 04/04/2024 09:00 AM EDT RP Workstation: [id number]   CT Head Wo Contrast Result Date: 04/02/2024 CLINICAL DATA:  Mental status change, unknown cause EXAM: CT HEAD WITHOUT CONTRAST TECHNIQUE: Contiguous axial images were obtained from the base of the skull through the vertex without intravenous contrast. RADIATION DOSE REDUCTION: This exam was performed according to the departmental dose-optimization program which includes automated exposure control, adjustment of the mA and/or kV according to patient size and/or use of iterative reconstruction technique. COMPARISON:  CT head 12/10/2019 FINDINGS: Brain: No evidence of large-territorial acute infarction. No parenchymal hemorrhage. No mass lesion. No extra-axial collection. No mass effect or midline shift. No hydrocephalus. Basilar cisterns are patent. Vascular: No hyperdense vessel. Atherosclerotic calcifications are present within the cavernous internal carotid and vertebral arteries. Skull: No acute fracture or focal lesion. Sinuses/Orbits: Paranasal sinuses and mastoid air cells are  clear. The orbits are unremarkable. Other: None. IMPRESSION: No acute intracranial abnormality. Electronically Signed   By: Morgane  Naveau M.D.   On: 04/02/2024 23:18   DG Chest 1 View Result Date: 04/02/2024 CLINICAL DATA:  Altered mental status EXAM: CHEST  1 VIEW COMPARISON:  Chest x-ray 08/11/2012 FINDINGS: The heart size and mediastinal contours are within normal limits. Both lungs are clear. Left shoulder arthroplasty and thoraco lumbar fusion hardware is partially visualized. Displaced transverse fracture through the right humeral neck appears new from 2013 and is favored as chronic. IMPRESSION: 1. No active disease. 2. Displaced transverse fracture through the right humeral neck appears new from 2013 and is favored as chronic. Correlate clinically. Electronically Signed   By: Greig Pique M.D.   On: 04/02/2024 22:45      Labs: BNP (last 3 results) No results for input(s): BNP in the last 8760 hours. Basic Metabolic Panel: Recent Labs  Lab 04/03/24 1639 04/03/24 2005 04/04/24 0033 04/04/24 0541 04/04/24 1614 04/05/24 0524  NA 141 138 139 140 136 141  K 3.2* 3.1* 3.7 4.1 4.1 3.9  CL 108 108 109 108 103 103  CO2 20* 20* 24 23 23 29   GLUCOSE 132* 232* 123* 228* 239* 239*  BUN 16 15 14 12 19 14   CREATININE 0.63 0.65 0.73 0.62 0.94 0.57  CALCIUM  8.9 8.7* 8.5* 8.5* 8.1* 8.1*  MG 2.1 2.0 1.9 1.8  --  1.8  PHOS 2.0* 2.1* 2.8 3.0  --  3.8   Liver Function Tests: Recent Labs  Lab 04/02/24 2217  AST 20  ALT 19  ALKPHOS 77  BILITOT 1.6*  PROT 7.7  ALBUMIN 3.7   No results for input(s): LIPASE, AMYLASE in the last 168 hours. Recent Labs  Lab 04/02/24 2217 04/04/24 0541  AMMONIA 17 16   CBC: Recent Labs  Lab 04/02/24 2217 04/03/24 0423 04/04/24 0541  WBC 8.8 7.9 4.1  NEUTROABS 5.8  --   --   HGB 15.7* 13.8 13.4  HCT 47.2* 40.2 39.1  MCV 87.2 85.4 85.6  PLT 247 226 192   Cardiac Enzymes: No results for input(s): CKTOTAL, CKMB, CKMBINDEX,  TROPONINI in the last 168 hours. BNP: Invalid input(s): POCBNP CBG: Recent Labs  Lab 04/04/24 1139 04/04/24 1413 04/04/24 1619 04/04/24 2108 04/05/24 0856  GLUCAP 354* 284* 263* 288* 278*   D-Dimer No results for input(s): DDIMER in the last 72 hours. Hgb A1c Recent Labs    04/03/24 0423  HGBA1C >  15.5*   Lipid Profile No results for input(s): CHOL, HDL, LDLCALC, TRIG, CHOLHDL, LDLDIRECT in the last 72 hours. Thyroid  function studies Recent Labs    04/03/24 1639  TSH 1.384   Anemia work up Recent Labs    04/03/24 1639  VITAMINB12 204   Urinalysis    Component Value Date/Time   COLORURINE YELLOW (A) 04/02/2024 2337   APPEARANCEUR CLEAR (A) 04/02/2024 2337   APPEARANCEUR Hazy 11/18/2012 0844   LABSPEC 1.028 04/02/2024 2337   LABSPEC 1.029 11/18/2012 0844   PHURINE 5.0 04/02/2024 2337   GLUCOSEU >=500 (A) 04/02/2024 2337   GLUCOSEU Negative 11/18/2012 0844   HGBUR NEGATIVE 04/02/2024 2337   BILIRUBINUR NEGATIVE 04/02/2024 2337   BILIRUBINUR Negative 11/18/2012 0844   KETONESUR 80 (A) 04/02/2024 2337   PROTEINUR 30 (A) 04/02/2024 2337   NITRITE NEGATIVE 04/02/2024 2337   LEUKOCYTESUR NEGATIVE 04/02/2024 2337   LEUKOCYTESUR Negative 11/18/2012 0844   Sepsis Labs Recent Labs  Lab 04/02/24 2217 04/03/24 0423 04/04/24 0541  WBC 8.8 7.9 4.1   Microbiology Recent Results (from the past 240 hours)  MRSA Next Gen by PCR, Nasal     Status: None   Collection Time: 04/03/24  1:16 PM   Specimen: Nasal Mucosa; Nasal Swab  Result Value Ref Range Status   MRSA by PCR Next Gen NOT DETECTED NOT DETECTED Final    Comment: (NOTE) The GeneXpert MRSA Assay (FDA approved for NASAL specimens only), is one component of a comprehensive MRSA colonization surveillance program. It is not intended to diagnose MRSA infection nor to guide or monitor treatment for MRSA infections. Test performance is not FDA approved in patients less than 58  years old. Performed at [facility], [address] [place], [place] [address]      Total time spend on discharging this patient, including the last patient exam, discussing the hospital stay, instructions for ongoing care as it relates to all pertinent caregivers, as well as preparing the medical discharge records, prescriptions, and/or referrals as applicable, is 35 minutes.    Ellouise Haber, MD  [facility] 04/05/2024, 10:58 AM

## 2024-04-05 NOTE — Plan of Care (Signed)
 IV removed, discharge instructions reviewed and patient discharged to home

## 2024-04-05 NOTE — TOC Transition Note (Signed)
 Transition of Care [facility]) - Discharge Note   Patient Details  Name: Tammy Acosta MRN: [id number] Date of Birth: 03-01-1956  [facility] CM/SW Contact:  Asberry CHRISTELLA Jaksch, RN Phone Number: 04/05/2024, 11:31 AM   Clinical Narrative:    Patient will DC to: Home w/ home health services Anticipated DC date: 04/05/2024 Family notified: Daughter Transport by: Daughter  Per MD patient ready for DC home . RN, patient, and patient's family notified of DC. RW and BSC (3 in 1) delivered to patient's room. Shaun with [facility] notified of patient's DC. Daughter to provide transport.   TOC signing off.    Final next level of care: Home w Home Health Services Barriers to Discharge: Barriers Resolved   Patient Goals and CMS Choice Patient states their goals for this hospitalization and ongoing recovery are:: Going home          Discharge Placement                       Discharge Plan and Services Additional resources added to the After Visit Summary for                  DME Arranged: Walker rolling, 3-N-1 DME Agency: [employer] Date DME Agency Contacted: 04/04/24 Time DME Agency Contacted: 1228 Representative spoke with at DME Agency: Mitch HH Arranged: RN, PT, OT, Nurse's Aide, Social Work [employer] Agency: [facility] Date HH Agency Contacted: 04/05/24 Time HH Agency Contacted: 1115 Representative spoke with at [facility] Agency: Shaun  Social Drivers of Health (SDOH) Interventions SDOH Screenings   Food Insecurity: No Food Insecurity (04/05/2024)  Housing: Low Risk  (04/05/2024)  Transportation Needs: No Transportation Needs (04/05/2024)  Utilities: Not At Risk (04/05/2024)  Alcohol Screen: Low Risk  (03/13/2024)  Depression (PHQ2-9): Low Risk  (03/13/2024)  Financial Resource Strain: Low Risk  (03/13/2024)  Physical Activity: Insufficiently Active (03/13/2024)  Social Connections: Socially Isolated (03/13/2024)  Stress: No Stress Concern  Present (03/13/2024)  Tobacco Use: High Risk (03/13/2024)  Health Literacy: Adequate Health Literacy (03/13/2024)     Readmission Risk Interventions     No data to display

## 2024-04-06 ENCOUNTER — Encounter: Admitting: Nurse Practitioner

## 2024-04-06 ENCOUNTER — Telehealth: Payer: Self-pay

## 2024-04-06 NOTE — Transitions of Care (Post Inpatient/ED Visit) (Signed)
   04/06/2024  Name: Tammy Acosta MRN: [id number] DOB: Feb 05, 1956  Today's TOC FU Call Status: Today's TOC FU Call Status:: Unsuccessful Call (1st Attempt) Unsuccessful Call (1st Attempt) Date: 04/06/24  Attempted to reach the patient regarding the most recent Inpatient/ED visit.  Follow Up Plan: Additional outreach attempts will be made to reach the patient to complete the Transitions of Care (Post Inpatient/ED visit) call.   Shona Prow RN, CCM [place]  VBCI-Population Health RN Care Manager [phone]

## 2024-04-07 ENCOUNTER — Telehealth: Payer: Self-pay

## 2024-04-07 LAB — VITAMIN B1: Vitamin B1 (Thiamine): 107.4 nmol/L (ref 66.5–200.0)

## 2024-04-07 NOTE — Transitions of Care (Post Inpatient/ED Visit) (Signed)
   04/07/2024  Name: Tammy Acosta MRN: [id number] DOB: Mar 11, 1956  Today's TOC FU Call Status: Today's TOC FU Call Status:: Unsuccessful Call (2nd Attempt) Unsuccessful Call (2nd Attempt) Date: 04/07/24  Attempted to reach the patient regarding the most recent Inpatient/ED visit.  Follow Up Plan: Additional outreach attempts will be made to reach the patient to complete the Transitions of Care (Post Inpatient/ED visit) call.   Shona Prow RN, CCM [place]  VBCI-Population Health RN Care Manager [phone]

## 2024-04-07 NOTE — Telephone Encounter (Signed)
 Copied from CRM #[id number]. Topic: General - Other >> Apr 07, 2024  1:11 PM Franky GRADE wrote: Reason for CRM: [facility] is calling to advise that patient pushed the start date of the physical therapy to Monday 04/10/2024 which is not within the 48 hour window and wanted to let the provider know. Best call back number [phone] option 2.

## 2024-04-07 NOTE — Telephone Encounter (Signed)
 Noted. Just FYI, patient see in October.

## 2024-04-07 NOTE — Telephone Encounter (Signed)
 Copied from CRM #[id number]. Topic: General - Other >> Apr 07, 2024  1:11 PM Franky GRADE wrote: Reason for CRM: [facility] is calling to advise that patient pushed the start date of the physical therapy to Monday 04/10/2024 which is not within the 48 hour window and wanted to let the provider know. Best call back number [phone] option 2.

## 2024-04-10 ENCOUNTER — Telehealth: Payer: Self-pay

## 2024-04-10 ENCOUNTER — Other Ambulatory Visit: Payer: Self-pay | Admitting: Nurse Practitioner

## 2024-04-10 DIAGNOSIS — B0222 Postherpetic trigeminal neuralgia: Secondary | ICD-10-CM

## 2024-04-10 DIAGNOSIS — M792 Neuralgia and neuritis, unspecified: Secondary | ICD-10-CM

## 2024-04-10 NOTE — Transitions of Care (Post Inpatient/ED Visit) (Signed)
   04/10/2024  Name: Tammy Acosta MRN: [id number] DOB: 08-17-1956  Today's TOC FU Call Status: Today's TOC FU Call Status:: Successful TOC FU Call Completed TOC FU Call Complete Date: 04/10/24 (spoke with patient who declined TOC program  - TOC RN encouraged patient to make hospital follow up with PCP) Patient's Name and Date of Birth confirmed.  Shona Prow RN, CCM [place]  VBCI-Population Health RN Care Manager [phone]

## 2024-04-11 ENCOUNTER — Other Ambulatory Visit: Payer: Self-pay

## 2024-04-11 NOTE — Telephone Encounter (Signed)
 error

## 2024-04-19 ENCOUNTER — Inpatient Hospital Stay: Admitting: Internal Medicine

## 2024-04-20 ENCOUNTER — Telehealth: Payer: Self-pay

## 2024-04-20 NOTE — Telephone Encounter (Signed)
 I spoke with patient and let her know that we received a fax from [facility] with orders for signature.  Patient has a TOC appointment scheduled with Leron Glance, FNP-C, on 05/24/2024.  I spoke with my supervisor, Shanda, and she gave me permission to offer a sooner [facility] appointment with Dr. Kalpana Bair on 05/11/2024, after which time, Dr. Abbey will be able to sign orders for patient.  I spoke with patient, who was fine with this change, and scheduled her [facility] appointment with Dr. Abbey on 05/11/2024.  I spoke with Tiffany at [facility], and let her know the situation.  I asked her to please resend the fax, addressing it to Dr. Luke Abbey, on or after 05/11/2024.

## 2024-04-26 ENCOUNTER — Telehealth: Payer: Self-pay

## 2024-04-26 NOTE — Telephone Encounter (Signed)
 Faxed [facility] to let them know that Dr. Maribeth is not at this practice any more and that the Patient was supposed to see another provider for a hospital follow up but the Patient no showed. [facility] is also aware that the Patient has a TOC appointment with Dr. Abbey on 05/11/24. Received an okay confirmation on the fax.

## 2024-04-28 ENCOUNTER — Other Ambulatory Visit: Payer: Self-pay

## 2024-04-28 DIAGNOSIS — B0222 Postherpetic trigeminal neuralgia: Secondary | ICD-10-CM

## 2024-04-28 DIAGNOSIS — M792 Neuralgia and neuritis, unspecified: Secondary | ICD-10-CM

## 2024-04-28 NOTE — Telephone Encounter (Signed)
 Copied from CRM [id number]. Topic: Clinical - Medication Refill >> Apr 28, 2024 11:12 AM Carlyon D wrote: Medication: gabapentin  (NEURONTIN ) 800 MG tablet  Has the patient contacted their pharmacy? Yes (Agent: If no, request that the patient contact the pharmacy for the refill. If patient does not wish to contact the pharmacy document the reason why and proceed with request.) (Agent: If yes, when and what did the pharmacy advise?)  This is the patient's preferred pharmacy:  [id number] - [place], [place] - [address] AT [facility] [address] [facility] [address] [place] [address] Phone: [phone] Fax: [phone]     Is this the correct pharmacy for this prescription? Yes If no, delete pharmacy and type the correct one.   Has the prescription been filled recently? No  Is the patient out of the medication? Yes  Has the patient been seen for an appointment in the last year OR does the patient have an upcoming appointment? Yes  Can we respond through MyChart? Yes  Agent: Please be advised that Rx refills may take up to 3 business days. We ask that you follow-up with your pharmacy.

## 2024-05-04 ENCOUNTER — Emergency Department
Admission: EM | Admit: 2024-05-04 | Discharge: 2024-05-06 | Disposition: A | Discharge status: Other Acute Inpatient Hospital | Source: Home / Self Care

## 2024-05-04 ENCOUNTER — Other Ambulatory Visit: Payer: Self-pay

## 2024-05-04 ENCOUNTER — Ambulatory Visit: Payer: Self-pay

## 2024-05-04 DIAGNOSIS — I10 Essential (primary) hypertension: Secondary | ICD-10-CM | POA: Insufficient documentation

## 2024-05-04 DIAGNOSIS — F331 Major depressive disorder, recurrent, moderate: Secondary | ICD-10-CM | POA: Diagnosis not present

## 2024-05-04 DIAGNOSIS — Z7984 Long term (current) use of oral hypoglycemic drugs: Secondary | ICD-10-CM | POA: Insufficient documentation

## 2024-05-04 DIAGNOSIS — F419 Anxiety disorder, unspecified: Secondary | ICD-10-CM | POA: Insufficient documentation

## 2024-05-04 DIAGNOSIS — Z79899 Other long term (current) drug therapy: Secondary | ICD-10-CM | POA: Insufficient documentation

## 2024-05-04 DIAGNOSIS — R45851 Suicidal ideations: Secondary | ICD-10-CM | POA: Insufficient documentation

## 2024-05-04 DIAGNOSIS — Z794 Long term (current) use of insulin: Secondary | ICD-10-CM | POA: Insufficient documentation

## 2024-05-04 DIAGNOSIS — F0390 Unspecified dementia without behavioral disturbance: Secondary | ICD-10-CM | POA: Insufficient documentation

## 2024-05-04 DIAGNOSIS — R739 Hyperglycemia, unspecified: Secondary | ICD-10-CM

## 2024-05-04 DIAGNOSIS — E1165 Type 2 diabetes mellitus with hyperglycemia: Secondary | ICD-10-CM | POA: Insufficient documentation

## 2024-05-04 DIAGNOSIS — F321 Major depressive disorder, single episode, moderate: Secondary | ICD-10-CM | POA: Insufficient documentation

## 2024-05-04 LAB — CBC
HCT: 40.7 % (ref 36.0–46.0)
Hemoglobin: 13.2 g/dL (ref 12.0–15.0)
MCH: 29.3 pg (ref 26.0–34.0)
MCHC: 32.4 g/dL (ref 30.0–36.0)
MCV: 90.2 fL (ref 80.0–100.0)
Platelets: 252 K/uL (ref 150–400)
RBC: 4.51 MIL/uL (ref 3.87–5.11)
RDW: 12.7 % (ref 11.5–15.5)
WBC: 5.9 K/uL (ref 4.0–10.5)
nRBC: 0 % (ref 0.0–0.2)

## 2024-05-04 LAB — COMPREHENSIVE METABOLIC PANEL WITH GFR
ALT: 20 U/L (ref 0–44)
AST: 16 U/L (ref 15–41)
Albumin: 3.4 g/dL — ABNORMAL LOW (ref 3.5–5.0)
Alkaline Phosphatase: 52 U/L (ref 38–126)
Anion gap: 11 (ref 5–15)
BUN: 17 mg/dL (ref 8–23)
CO2: 26 mmol/L (ref 22–32)
Calcium: 9.5 mg/dL (ref 8.9–10.3)
Chloride: 95 mmol/L — ABNORMAL LOW (ref 98–111)
Creatinine, Ser: 0.8 mg/dL (ref 0.44–1.00)
GFR, Estimated: >60 mL/min (ref >60)
Glucose, Bld: 537 mg/dL (ref 70–99)
Potassium: 5.3 mmol/L — ABNORMAL HIGH (ref 3.5–5.1)
Sodium: 132 mmol/L — ABNORMAL LOW (ref 135–145)
Total Bilirubin: 0.4 mg/dL (ref 0.0–1.2)
Total Protein: 6.7 g/dL (ref 6.5–8.1)

## 2024-05-04 LAB — CBG MONITORING, ED
Glucose-Capillary: 414 mg/dL — ABNORMAL HIGH (ref 70–99)
Glucose-Capillary: 391 mg/dL — ABNORMAL HIGH (ref 70–99)
Glucose-Capillary: 130 mg/dL — ABNORMAL HIGH (ref 70–99)

## 2024-05-04 LAB — TROPONIN I (HIGH SENSITIVITY): Troponin I (High Sensitivity): 5 ng/L (ref <18)

## 2024-05-04 LAB — ETHANOL: Alcohol, Ethyl (B): <15 mg/dL (ref <15)

## 2024-05-04 MED ORDER — INSULIN ASPART 100 UNIT/ML IJ SOLN
0.0000 [IU] | Freq: Three times a day (TID) | INTRAMUSCULAR | Status: DC
  Administered 2024-05-04: 9 [IU] via SUBCUTANEOUS
  Administered 2024-05-05: 2 [IU] via SUBCUTANEOUS
  Administered 2024-05-05: 5 [IU] via SUBCUTANEOUS
  Administered 2024-05-05: 3 [IU] via SUBCUTANEOUS
  Filled 2024-05-04: qty 2
  Filled 2024-05-04: qty 3
  Filled 2024-05-04: qty 9
  Filled 2024-05-04: qty 5

## 2024-05-04 MED ORDER — LORAZEPAM 0.5 MG PO TABS
0.5000 mg | ORAL_TABLET | Freq: Once | ORAL | Status: AC
  Administered 2024-05-04: 0.5 mg via ORAL
  Filled 2024-05-04: qty 1

## 2024-05-04 MED ORDER — NICOTINE 21 MG/24HR TD PT24
21.0000 mg | MEDICATED_PATCH | Freq: Once | TRANSDERMAL | Status: AC
  Administered 2024-05-04: 21 mg via TRANSDERMAL
  Filled 2024-05-04: qty 1

## 2024-05-04 MED ORDER — METFORMIN HCL 500 MG PO TABS
1000.0000 mg | ORAL_TABLET | Freq: Two times a day (BID) | ORAL | Status: DC
  Administered 2024-05-04 – 2024-05-05 (×3): 1000 mg via ORAL
  Filled 2024-05-04 (×3): qty 2

## 2024-05-04 MED ORDER — INSULIN GLARGINE 100 UNIT/ML ~~LOC~~ SOLN
40.0000 [IU] | Freq: Once | SUBCUTANEOUS | Status: AC
Start: 2024-05-04 — End: 2024-05-04
  Administered 2024-05-04: 40 [IU] via SUBCUTANEOUS
  Filled 2024-05-04: qty 0.4

## 2024-05-04 MED ORDER — INSULIN ASPART 100 UNIT/ML IJ SOLN: 0.0000 [IU] | Freq: Every day | INTRAMUSCULAR | Status: DC

## 2024-05-04 NOTE — ED Notes (Signed)
 Patient updated with POC. Pt is angry and still making comments of wanting to leave. Patient advised that she is currently voluntary but if she tries to leave she will be IVC'd. Per notes, patient must have a blood sugar of <350 x 24 hours before she can be psych admitted.

## 2024-05-04 NOTE — ED Notes (Signed)
 Pt dressed out in hospital scrubs. Pt belongings include:  1 white bra 1 green dress 1 green plaid button up shirt 1 pair white socks 1 pair white tennis shoes 1 black purse 1 cell phone 1 brown cane

## 2024-05-04 NOTE — ED Notes (Signed)
 This tech obtained vital signs on pt.

## 2024-05-04 NOTE — ED Notes (Signed)
 After receiving permission from patient to give update to granddaughter, Chiquita was called and made aware of current plan of care for patient.
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
## 2024-05-04 NOTE — ED Notes (Signed)
Pt given snack, no other needs at the moment.  

## 2024-05-04 NOTE — ED Triage Notes (Addendum)
 Pt to ED for c/o anxiety and SI. States she has been out of her medication and unable to pick it up for about a month. States she has been wanting to do away with herself and there is no reason to live. States she has considered using medication to attempt, but has not attempted. Pt tearful in triage and states daughter is not very nice to her and says she is a Nurse, children's.  Pt states chest pain due to stress.

## 2024-05-04 NOTE — ED Notes (Signed)
 Pt sleeping, will give pt a snack when she wakes up.

## 2024-05-04 NOTE — Consult Note (Signed)
 [facility] Psychiatric Consult Initial  Patient Name: .Tammy Acosta  MRN: [id number]  DOB: 1956-06-04  Consult Order details:  Orders (From admission, onward)     Start     Ordered   05/04/24 1204  CONSULT TO CALL ACT TEAM       Ordering Provider: Claudene Rover, MD  Provider:  (Not yet assigned)  Question:  Reason for Consult?  Answer:  Psych consult   05/04/24 1203   05/04/24 1204  IP CONSULT TO PSYCHIATRY       Ordering Provider: Claudene Rover, MD  Provider:  (Not yet assigned)  Question:  Reason for consult:  Answer:  Medication management   05/04/24 1203             Mode of Visit: In person    Psychiatry Consult Evaluation  Service Date: May 04, 2024 LOS:  LOS: 0 days  Chief Complaint Worsening depression and SI with no intent and plan  Primary Psychiatric Diagnoses  MDD   Assessment  Tammy Acosta is a 68 y.o. female admitted: Presented to the ED     Diagnoses:  Active Hospital problems: MDD SI with no intent and plan   Plan   ## Psychiatric Medication Recommendations:  Continue home medication-Zoloft  100 mg daily for depression. Plan to admit her to gero-psych unit for mood stabilization and safety  ## Medical Decision Making Capacity: Not specifically addressed in this encounter  ## Further Work-up:   -- most recent EKG on NA had QtC of NA -- Pertinent labwork reviewed earlier this admission includes: Blood sugar 500   ## Disposition:-- We recommend inpatient psychiatric hospitalization when medically cleared. Patient is under voluntary admission status at this time; please IVC if attempts to leave hospital.  ## Behavioral / Environmental: - No specific recommendations at this time.     ## Safety and Observation Level:  - Based on my clinical evaluation, I estimate the patient to be at high risk of self harm in the current setting. - At this time, we recommend  routine. This decision is based on my review of the chart including  patient's history and current presentation, interview of the patient, mental status examination, and consideration of suicide risk including evaluating suicidal ideation, plan, intent, suicidal or self-harm behaviors, risk factors, and protective factors. This judgment is based on our ability to directly address suicide risk, implement suicide prevention strategies, and develop a safety plan while the patient is in the clinical setting. Please contact our team if there is a concern that risk level has changed.  CSSR Risk Category:C-SSRS RISK CATEGORY: High Risk  Suicide Risk Assessment: Patient has following modifiable risk factors for suicide: current symptoms: anxiety/panic, insomnia, impulsivity, anhedonia, hopelessness, which we are addressing by by medications and therapy. Patient has following non-modifiable or demographic risk factors for suicide: separation or divorce Patient has the following protective factors against suicide: Access to outpatient mental health care  Thank you for this consult request. Recommendations have been communicated to the primary team.  We will wait for her to be medically cleared at this time.   [name], NP       History of Present Illness  Relevant Aspects of Hospital ED   Patient Report:  HPI   Tammy Acosta is a 68 y.o. female who presents to the ED for evaluation of Anxiety and Suicidal   I review a medical DC summary from last month where she was admitted for DKA.  Type 2 diabetes,  suspect undiagnosed dementia, HTN, anxiety and depression.   Patient presents voluntarily due to anxiety and thoughts of ending it all.  She reports primary frustration with her daughter withholding money, she has not had her cigarettes for 4 days and reports feeling anxious and requesting a nicotine  patch.  She reports that she might just take all of her medications to kill herself.    Psych ROS:  Depression: MDD Anxiety:   Moderate to severe Mania  (lifetime and current): Denies Psychosis: (lifetime and current): Denies  Collateral information:  Contacted  She told us  to call her grand daughter but could not remember her phone number.Will get the number and call her when she admitted    Psychiatric and Social History  Psychiatric History:  Information collected from Patient and chart review  Prev Dx/Sx: MDD Current Psych Provider: Her PCP Home Meds (current): Zoloft  100 mg Previous Med Trials: NA Therapy: Denies  Prior Psych Hospitalization: Denies  Prior Self Harm: Denies Prior Violence: Denies  Family Psych History: NA Family Hx suicide: NA  Social History:   Educational Hx: NA Occupational Hx: NA Legal Hx: NA Living Situation: States she lives alone Spiritual Hx: NA Access to weapons/lethal means: denies   Substance History Alcohol : NA  Type of alcohol  NA Last Drink NA Number of drinks per day NA History of alcohol  withdrawal seizures NA History of DT's NA Tobacco: NA Illicit drugs: NA Prescription drug abuse: NA Rehab hx: NA  Exam Findings  Physical Exam: Please see ED physical exam Vital Signs:  Temp:  [98.6 F (37 C)] 98.6 F (37 C) (09/11 1028) Pulse Rate:  [68] 68 (09/11 1028) Resp:  [19] 19 (09/11 1028) BP: (140)/(82) 140/82 (09/11 1028) SpO2:  [100 %] 100 % (09/11 1028) Weight:  [53.1 kg] 53.1 kg (09/11 1039) Blood pressure (!) 140/82, pulse 68, temperature 98.6 F (37 C), temperature source Oral, resp. rate 19, height 5' 3 (1.6 m), weight 53.1 kg, SpO2 100%. Body mass index is 20.73 kg/m.    Mental Status Exam: General Appearance: Fairly Groomed  Orientation:  Other:  Partial  Memory:  Recent;   Fair  Concentration:  Concentration: Fair  Recall:  Fair  Attention  Fair  Eye Contact:  Fair  Speech:  Slow  Language:  Good  Volume:  Decreased  Mood: anxious  Affect:  Depressed  Thought Process:  Linear  Thought Content:  Tangential  Suicidal Thoughts:  SI before she came to  the ED.Denies SI in the ED  Homicidal Thoughts:  No  Judgement:  Impaired  Insight:  Shallow  Psychomotor Activity:  Decreased  Akathisia:  No  Fund of Knowledge:  Fair      Assets:  Desire for Improvement  Cognition:  Impaired,  Mild  ADL's:  Impaired  AIMS (if indicated):        Other History   These have been pulled in through the EMR, reviewed, and updated if appropriate.  Family History:  The patient's family history includes Arthritis in an other family member; Breast cancer in her cousin, cousin, cousin, maternal aunt, and mother; Cancer in her maternal aunt, maternal aunt, and mother; Colon cancer in an other family member; Diabetes in an other family member; Heart disease in her father and mother; Hypertension in her mother; Lung cancer in an other family member; Uterine cancer in an other family member.  Medical History: Past Medical History:  Diagnosis Date   Altered mental status 02/04/2014   Arthritis    Colon  polyps    Depression    Diabetes mellitus without complication (HCC)    Pt states she takes Insulin  and metformin .   Hypercholesteremia    Hyperlipidemia    Hypertension    Shingles    10/17   Spine disorder    Trigeminal herpes zoster (left V1 distribution) 06/24/2016   Viral upper respiratory illness 12/03/2015    Surgical History: Past Surgical History:  Procedure Laterality Date   ABDOMINAL HYSTERECTOMY     BACK SURGERY     CHOLECYSTECTOMY     KYPHOPLASTY       Medications:   Current Facility-Administered Medications:    insulin  aspart (novoLOG ) injection 0-5 Units, 0-5 Units, Subcutaneous, QHS, Claudene Rover, MD   insulin  aspart (novoLOG ) injection 0-9 Units, 0-9 Units, Subcutaneous, TID WC, Claudene Rover, MD, 9 Units at 05/04/24 1712   metFORMIN  (GLUCOPHAGE ) tablet 1,000 mg, 1,000 mg, Oral, BID WC, Claudene Rover, MD, 1,000 mg at 05/04/24 1302   nicotine  (NICODERM CQ  - dosed in mg/24 hours) patch 21 mg, 21 mg, Transdermal, Once, Claudene Rover,  MD, 21 mg at 05/04/24 1302  Current Outpatient Medications:    ezetimibe  (ZETIA ) 10 MG tablet, TAKE 1 TABLET(10 MG) BY MOUTH DAILY, Disp: 30 tablet, Rfl: 0   gabapentin  (NEURONTIN ) 800 MG tablet, TAKE 1 TABLET(800 MG) BY MOUTH FOUR TIMES DAILY, Disp: 120 tablet, Rfl: 2   insulin  glargine (LANTUS  SOLOSTAR) 100 UNIT/ML Solostar Pen, Inject 40 Units into the skin daily., Disp: 12 mL, Rfl: 2   metFORMIN  (GLUCOPHAGE ) 1000 MG tablet, TAKE 1 TABLET(1000 MG) BY MOUTH TWICE DAILY WITH A MEAL, Disp: 60 tablet, Rfl: 0   rosuvastatin  (CRESTOR ) 40 MG tablet, TAKE 1 TABLET(40 MG) BY MOUTH DAILY, Disp: 30 tablet, Rfl: 0   sertraline  (ZOLOFT ) 100 MG tablet, Take 2 tablets (200 mg total) by mouth daily., Disp: 60 tablet, Rfl: 0   blood glucose meter kit and supplies, Dispense One touch meter.  E11.9, Disp: 1 each, Rfl: 0   Blood Glucose Monitoring Suppl (ONE TOUCH ULTRA 2) w/Device KIT, Dispense 1 meter to use to test blood glucose once daily. Dx code: E11.42., Disp: 1 kit, Rfl: 0   Continuous Glucose Receiver (FREESTYLE LIBRE 3 READER) DEVI, 1 each by Does not apply route daily. Use to check glucose continuously., Disp: 1 each, Rfl: 0   Continuous Glucose Sensor (FREESTYLE LIBRE 3 SENSOR) MISC, Place 1 sensor on the skin every 14 days. Use to check glucose continuously, Disp: 2 each, Rfl: 0   cyanocobalamin  1000 MCG tablet, Take 1 tablet (1,000 mcg total) by mouth daily. (Patient not taking: Reported on 05/04/2024), Disp: , Rfl:    glucose blood (ONETOUCH ULTRA) test strip, USE 1 STRIP TO CHECK GLUCOSE UP TO THREE TIMES DAILY AS DIRECTED., Disp: 100 each, Rfl: 0   Insulin  Pen Needle (PEN NEEDLES) 32G X 5 MM MISC, 1 pen by Does not apply route daily. Use to inject insulin  daily, Disp: 100 each, Rfl: 4   Lancets (ONETOUCH DELICA PLUS LANCET33G) MISC, USE   TO CHECK GLUCOSE UP TO THREE TIMES DAILY AS DIRECTED, Disp: 100 each, Rfl: 0  Allergies: Allergies  Allergen Reactions   Penicillins Anaphylaxis and Hives    Meloxicam     Tetracyclines & Related Rash    Teka Chanda, NP

## 2024-05-04 NOTE — ED Notes (Signed)
 Patient arrived to quad from triage, 20HA, no signs of distress

## 2024-05-04 NOTE — ED Notes (Signed)
 RN taking over care of pt at this time after pt receiving handoff. Pt reporting to ED d/t SI and anxiety. Pt has been having thoughts with a plan but has not attempted. Pt here voluntarily at this time and made aware that an attempt to leave will result in IVC. Pt BG has been elevated and in need of decreased BG for 24 hr before admission. Pt ABCs intact. RR even and unlabored. Pt in NAD. Bed in lowest locked position. Call bell in reach. Denies needs at this time.   Past Medical History:  Diagnosis Date   Altered mental status 02/04/2014   Arthritis    Colon polyps    Depression    Diabetes mellitus without complication (HCC)    Pt states she takes Insulin  and metformin .   Hypercholesteremia    Hyperlipidemia    Hypertension    Shingles    10/17   Spine disorder    Trigeminal herpes zoster (left V1 distribution) 06/24/2016   Viral upper respiratory illness 12/03/2015

## 2024-05-04 NOTE — ED Notes (Signed)
 DR. Claudene speaking with patient currently

## 2024-05-04 NOTE — ED Provider Notes (Signed)
 [facility] Provider Note    Event Date/Time   First MD Initiated Contact with Patient 05/04/24 1127     (approximate)   History   Anxiety and Suicidal   HPI  Tammy Acosta is a 68 y.o. female who presents to the ED for evaluation of Anxiety and Suicidal   I review a medical DC summary from last month where she was admitted for DKA.  Type 2 diabetes, suspect undiagnosed dementia, HTN, anxiety and depression.  Patient presents voluntarily due to anxiety and thoughts of ending it all.  She reports primary frustration with her daughter withholding money, she has not had her cigarettes for 4 days and reports feeling anxious and requesting a nicotine  patch.  She reports that she might just take all of her medications to kill herself.   Physical Exam   Triage Vital Signs: ED Triage Vitals  Encounter Vitals Group     BP 05/04/24 1028 (!) 140/82     Girls Systolic BP Percentile --      Girls Diastolic BP Percentile --      Boys Systolic BP Percentile --      Boys Diastolic BP Percentile --      Pulse Rate 05/04/24 1028 68     Resp 05/04/24 1028 19     Temp 05/04/24 1028 98.6 F (37 C)     Temp Source 05/04/24 1028 Oral     SpO2 05/04/24 1028 100 %     Weight 05/04/24 1039 117 lb (53.1 kg)     Height 05/04/24 1039 5' 3 (1.6 m)     Head Circumference --      Peak Flow --      Pain Score 05/04/24 1035 6     Pain Loc --      Pain Education --      Exclude from Growth Chart --     Most recent vital signs: Vitals:   05/04/24 1028  BP: (!) 140/82  Pulse: 68  Resp: 19  Temp: 98.6 F (37 C)  SpO2: 100%    General: Awake, no distress.  CV:  Good peripheral perfusion.  Resp:  Normal effort.  Abd:  No distention.  MSK:  No deformity noted.  Neuro:  No focal deficits appreciated. Other:     ED Results / Procedures / Treatments   Labs (all labs ordered are listed, but only abnormal results are displayed) Labs Reviewed  COMPREHENSIVE  METABOLIC PANEL WITH GFR - Abnormal; Notable for the following components:      Result Value   Sodium 132 (*)    Potassium 5.3 (*)    Chloride 95 (*)    Glucose, Bld 537 (*)    Albumin 3.4 (*)    All other components within normal limits  CBG MONITORING, ED - Abnormal; Notable for the following components:   Glucose-Capillary 414 (*)    All other components within normal limits  ETHANOL  CBC  URINE DRUG SCREEN, QUALITATIVE ([facility] ONLY)  TROPONIN I (HIGH SENSITIVITY)    EKG  RADIOLOGY   Official radiology report(s): No results found.  PROCEDURES and INTERVENTIONS:  .Critical Care  Performed by: Claudene Rover, MD Authorized by: Claudene Rover, MD   Critical care provider statement:    Critical care time (minutes):  30   Critical care time was exclusive of:  Separately billable procedures and treating other patients   Critical care was necessary to treat or prevent imminent or life-threatening deterioration of the  following conditions:  Endocrine crisis   Critical care was time spent personally by me on the following activities:  Development of treatment plan with patient or surrogate, discussions with consultants, evaluation of patient's response to treatment, examination of patient, ordering and review of laboratory studies, ordering and review of radiographic studies, ordering and performing treatments and interventions, pulse oximetry, re-evaluation of patient's condition and review of old charts   Medications  nicotine  (NICODERM CQ  - dosed in mg/24 hours) patch 21 mg (21 mg Transdermal Patch Applied 05/04/24 1302)  insulin  glargine (LANTUS ) injection 40 Units (has no administration in time range)  metFORMIN  (GLUCOPHAGE ) tablet 1,000 mg (1,000 mg Oral Given 05/04/24 1302)  insulin  aspart (novoLOG ) injection 0-9 Units (has no administration in time range)  insulin  aspart (novoLOG ) injection 0-5 Units (has no administration in time range)  LORazepam  (ATIVAN ) tablet 0.5 mg (0.5 mg  Oral Given 05/04/24 1302)     IMPRESSION / MDM / ASSESSMENT AND PLAN / ED COURSE  I reviewed the triage vital signs and the nursing notes.  Differential diagnosis includes, but is not limited to, polysubstance abuse, acute withdrawals, overdose attempt or toxidrome  {Patient presents with symptoms of an acute illness or injury that is potentially life-threatening.  Patient presents with anxiety and vague suicidal thoughts requiring psychiatric admission.  Has not yet taken her insulin  for the day and is quite hyperglycemic with pseudohyponatremia without acidosis or signs of HHS.  Restart her home medications with long-acting Lantus  and sliding scale short acting insulin , and metformin .  Evaluated by psychiatry who recommends inpatient psychiatric care, pending medical stabilization.  Apparently will require 24 hours of fingerstick glucose less than 350.  Clinical Course as of 05/04/24 1513  Thu May 04, 2024  1203 The patient has been placed in psychiatric observation due to the need to provide a safe environment for the patient while obtaining psychiatric consultation and evaluation, as well as ongoing medical and medication management to treat the patient's condition.  The patient has not been placed under full IVC at this time.   [DS]    Clinical Course User Index [DS] Claudene Rover, MD     FINAL CLINICAL IMPRESSION(S) / ED DIAGNOSES   Final diagnoses:  Anxiety  Suicidal ideation  Hyperglycemia     Rx / DC Orders   ED Discharge Orders     None        Note:  This document was prepared using Dragon voice recognition software and may include unintentional dictation errors.   Claudene Rover, MD 05/04/24 [phone]

## 2024-05-04 NOTE — ED Notes (Signed)
 Serum glucose is 537, Dr. Claudene notified

## 2024-05-05 LAB — CBG MONITORING, ED
Glucose-Capillary: 252 mg/dL — ABNORMAL HIGH (ref 70–99)
Glucose-Capillary: 236 mg/dL — ABNORMAL HIGH (ref 70–99)
Glucose-Capillary: 156 mg/dL — ABNORMAL HIGH (ref 70–99)
Glucose-Capillary: 187 mg/dL — ABNORMAL HIGH (ref 70–99)

## 2024-05-05 LAB — RESP PANEL BY RT-PCR (RSV, FLU A&B, COVID)  RVPGX2
Influenza A by PCR: NEGATIVE
Influenza B by PCR: NEGATIVE
Resp Syncytial Virus by PCR: NEGATIVE
SARS Coronavirus 2 by RT PCR: NEGATIVE

## 2024-05-05 MED ORDER — POLYVINYL ALCOHOL 1.4 % OP SOLN
1.0000 [drp] | OPHTHALMIC | Status: DC | PRN
  Administered 2024-05-05: 1 [drp] via OPHTHALMIC
  Filled 2024-05-05: qty 15

## 2024-05-05 MED ORDER — NAPHAZOLINE-GLYCERIN 0.012-0.25 % OP SOLN: 1.0000 [drp] | Freq: Four times a day (QID) | OPHTHALMIC | Status: DC | PRN

## 2024-05-05 MED ORDER — ACETAMINOPHEN 500 MG PO TABS
1000.0000 mg | ORAL_TABLET | Freq: Once | ORAL | Status: AC
  Administered 2024-05-05: 1000 mg via ORAL
  Filled 2024-05-05: qty 2

## 2024-05-05 MED ORDER — MELATONIN 5 MG PO TABS
5.0000 mg | ORAL_TABLET | Freq: Every evening | ORAL | Status: DC | PRN
  Administered 2024-05-05: 5 mg via ORAL
  Filled 2024-05-05: qty 1

## 2024-05-05 MED ORDER — NICOTINE 21 MG/24HR TD PT24
21.0000 mg | MEDICATED_PATCH | Freq: Once | TRANSDERMAL | Status: DC
  Administered 2024-05-05: 21 mg via TRANSDERMAL
  Filled 2024-05-05: qty 1

## 2024-05-05 MED ORDER — INSULIN GLARGINE 100 UNIT/ML ~~LOC~~ SOLN
40.0000 [IU] | Freq: Every day | SUBCUTANEOUS | Status: DC
  Administered 2024-05-05: 40 [IU] via SUBCUTANEOUS
  Filled 2024-05-05: qty 0.4

## 2024-05-05 NOTE — ED Notes (Signed)
 Lunch tray provided to pt.

## 2024-05-05 NOTE — ED Notes (Signed)
 Assumed care of patient, pt awake, calm and cooperative, no distress noted

## 2024-05-05 NOTE — ED Notes (Addendum)
 Pt observed sitting on side of bed, upset that she is in the hallway and wanting to move to a room. Pt informed that at this time, there are no available rooms that she can go to. Pt complained of noise and bright light in hallway. Pt provided with ear plugs and psych safe eye mask. Provider messaged to request melatonin.

## 2024-05-05 NOTE — ED Notes (Signed)
 Waiting on admission orders for inpatient care.

## 2024-05-05 NOTE — ED Notes (Signed)
 This RN called Tammy Acosta to try and get information about pt's admission. Consulting civil engineer in middle of med pass. Will call back when she is done.

## 2024-05-05 NOTE — ED Provider Notes (Signed)
 Patient reporting some leg pain that is her baseline leg pain that she gets in her bilateral legs.  Patient ordered some Tylenol .  No redness or warmth noted to the legs.   Ernest Ronal BRAVO, MD 05/05/24 1229

## 2024-05-05 NOTE — ED Provider Notes (Signed)
 Emergency Medicine Observation Re-evaluation Note  Tammy Acosta is a 68 y.o. female, seen on rounds today.  Pt initially presented to the ED for complaints of Anxiety and Suicidal  Currently, the patient is is no acute distress. Denies any concerns at this time.  Physical Exam  Blood pressure 101/64, pulse 74, temperature 97.9 F (36.6 C), temperature source Oral, resp. rate 17, height 5' 3 (1.6 m), weight 53.1 kg, SpO2 98%.  Physical Exam: General: No apparent distress Pulm: Normal WOB Neuro: Moving all extremities Psych: Resting comfortably     ED Course / MDM   Clinical Course as of 05/05/24 0645  Thu May 04, 2024  1203 The patient has been placed in psychiatric observation due to the need to provide a safe environment for the patient while obtaining psychiatric consultation and evaluation, as well as ongoing medical and medication management to treat the patient's condition.  The patient has not been placed under full IVC at this time.   [DS]    Clinical Course User Index [DS] Tammy Rover, MD    I have reviewed the labs performed to date as well as medications administered while in observation.  Recent changes in the last 24 hours include: No acute events overnight.  Plan   Current plan: Patient awaiting psychiatric disposition.    Psychiatry recommends inpatient psychiatric treatment.  They recommend placing her under IVC if she were to attempt to leave.   Tammy Acosta, Tammy SAILOR, DO 05/05/24 [phone]

## 2024-05-05 NOTE — ED Notes (Signed)
 Pt peeded in the bed. Pt given new scrubs and underwear. Pts beding cleaned with purple wipes and new bedding applied

## 2024-05-05 NOTE — ED Notes (Signed)
 Patient complaining of chest pain. This RN in room to perform EKG, pt states it just a little bit and its anxiety. This RN requested to do EKG, patient declined, states no I am not having chest pain. Patient tearful and wanting to leave. This RN has explained the POC to patient several times already. Pt requesting to speak with provider, provider informed and is currently seeing patient.

## 2024-05-05 NOTE — ED Notes (Signed)
 Dinner tray provided to pt

## 2024-05-05 NOTE — ED Notes (Signed)
 Introduced self to pt. Pt calm and cooperative at this time. Pt told she would likely be able to be transferred downstairs to our [employer] after 2200 tonight.

## 2024-05-05 NOTE — ED Notes (Signed)
Pt given breakfast tray and beverage.  

## 2024-05-06 ENCOUNTER — Encounter: Payer: Self-pay | Admitting: Psychiatry

## 2024-05-06 ENCOUNTER — Inpatient Hospital Stay
Admission: AD | Admit: 2024-05-06 | Discharge: 2024-05-10 | DRG: 885 | Disposition: A | Discharge status: Home / Self Care | Payer: Self-pay | Source: Intra-hospital | Attending: Psychiatry | Admitting: Psychiatry

## 2024-05-06 ENCOUNTER — Other Ambulatory Visit: Payer: Self-pay

## 2024-05-06 DIAGNOSIS — Z886 Allergy status to analgesic agent status: Secondary | ICD-10-CM | POA: Diagnosis not present

## 2024-05-06 DIAGNOSIS — Z7984 Long term (current) use of oral hypoglycemic drugs: Secondary | ICD-10-CM

## 2024-05-06 DIAGNOSIS — Z833 Family history of diabetes mellitus: Secondary | ICD-10-CM | POA: Diagnosis not present

## 2024-05-06 DIAGNOSIS — Z881 Allergy status to other antibiotic agents status: Secondary | ICD-10-CM | POA: Diagnosis not present

## 2024-05-06 DIAGNOSIS — F331 Major depressive disorder, recurrent, moderate: Secondary | ICD-10-CM | POA: Diagnosis present

## 2024-05-06 DIAGNOSIS — Z79899 Other long term (current) drug therapy: Secondary | ICD-10-CM

## 2024-05-06 DIAGNOSIS — Z90710 Acquired absence of both cervix and uterus: Secondary | ICD-10-CM

## 2024-05-06 DIAGNOSIS — F0393 Unspecified dementia, unspecified severity, with mood disturbance: Secondary | ICD-10-CM | POA: Diagnosis present

## 2024-05-06 DIAGNOSIS — F17210 Nicotine dependence, cigarettes, uncomplicated: Secondary | ICD-10-CM | POA: Diagnosis present

## 2024-05-06 DIAGNOSIS — Y636 Underdosing and nonadministration of necessary drug, medicament or biological substance: Secondary | ICD-10-CM | POA: Diagnosis present

## 2024-05-06 DIAGNOSIS — Z9049 Acquired absence of other specified parts of digestive tract: Secondary | ICD-10-CM | POA: Diagnosis not present

## 2024-05-06 DIAGNOSIS — Z880 Allergy status to penicillin: Secondary | ICD-10-CM | POA: Diagnosis not present

## 2024-05-06 DIAGNOSIS — I10 Essential (primary) hypertension: Secondary | ICD-10-CM | POA: Diagnosis present

## 2024-05-06 DIAGNOSIS — Z604 Social exclusion and rejection: Secondary | ICD-10-CM | POA: Diagnosis present

## 2024-05-06 DIAGNOSIS — Z794 Long term (current) use of insulin: Secondary | ICD-10-CM | POA: Diagnosis not present

## 2024-05-06 DIAGNOSIS — E7800 Pure hypercholesterolemia, unspecified: Secondary | ICD-10-CM | POA: Diagnosis present

## 2024-05-06 DIAGNOSIS — Z8249 Family history of ischemic heart disease and other diseases of the circulatory system: Secondary | ICD-10-CM

## 2024-05-06 DIAGNOSIS — F329 Major depressive disorder, single episode, unspecified: Secondary | ICD-10-CM

## 2024-05-06 DIAGNOSIS — R45851 Suicidal ideations: Secondary | ICD-10-CM | POA: Diagnosis present

## 2024-05-06 LAB — GLUCOSE, CAPILLARY
Glucose-Capillary: 310 mg/dL — ABNORMAL HIGH (ref 70–99)
Glucose-Capillary: 269 mg/dL — ABNORMAL HIGH (ref 70–99)
Glucose-Capillary: 306 mg/dL — ABNORMAL HIGH (ref 70–99)
Glucose-Capillary: 321 mg/dL — ABNORMAL HIGH (ref 70–99)

## 2024-05-06 MED ORDER — ACETAMINOPHEN 325 MG PO TABS
650.0000 mg | ORAL_TABLET | Freq: Four times a day (QID) | ORAL | Status: DC | PRN
  Administered 2024-05-06 – 2024-05-09 (×6): 650 mg via ORAL
  Filled 2024-05-06 (×7): qty 2

## 2024-05-06 MED ORDER — INSULIN ASPART 100 UNIT/ML IJ SOLN
0.0000 [IU] | Freq: Three times a day (TID) | INTRAMUSCULAR | Status: DC
  Administered 2024-05-06: 7 [IU] via SUBCUTANEOUS
  Administered 2024-05-06: 5 [IU] via SUBCUTANEOUS
  Administered 2024-05-06: 7 [IU] via SUBCUTANEOUS
  Administered 2024-05-07: 2 [IU] via SUBCUTANEOUS
  Administered 2024-05-07 – 2024-05-09 (×4): 3 [IU] via SUBCUTANEOUS
  Administered 2024-05-09 (×2): 2 [IU] via SUBCUTANEOUS
  Filled 2024-05-06 (×2): qty 1

## 2024-05-06 MED ORDER — INSULIN ASPART 100 UNIT/ML IJ SOLN
0.0000 [IU] | Freq: Every day | INTRAMUSCULAR | Status: DC
  Administered 2024-05-06: 4 [IU] via SUBCUTANEOUS
  Administered 2024-05-07 – 2024-05-08 (×2): 3 [IU] via SUBCUTANEOUS
  Filled 2024-05-06 (×3): qty 1

## 2024-05-06 MED ORDER — SERTRALINE HCL 50 MG PO TABS
100.0000 mg | ORAL_TABLET | Freq: Every day | ORAL | Status: DC
  Administered 2024-05-06 – 2024-05-10 (×5): 100 mg via ORAL
  Filled 2024-05-06 (×5): qty 2

## 2024-05-06 MED ORDER — OLANZAPINE 5 MG PO TABS
5.0000 mg | ORAL_TABLET | Freq: Three times a day (TID) | ORAL | Status: DC | PRN
Start: 2024-05-06 — End: 2024-05-10

## 2024-05-06 MED ORDER — MIRTAZAPINE 15 MG PO TBDP
15.0000 mg | ORAL_TABLET | Freq: Every day | ORAL | Status: DC
Start: 2024-05-06 — End: 2024-05-10
  Administered 2024-05-06 – 2024-05-09 (×4): 15 mg via ORAL
  Filled 2024-05-06 (×4): qty 1

## 2024-05-06 MED ORDER — INSULIN GLARGINE 100 UNIT/ML ~~LOC~~ SOLN
40.0000 [IU] | Freq: Every day | SUBCUTANEOUS | Status: DC
  Administered 2024-05-06 – 2024-05-10 (×5): 40 [IU] via SUBCUTANEOUS
  Filled 2024-05-06 (×5): qty 0.4

## 2024-05-06 MED ORDER — VITAMIN B-12 1000 MCG PO TABS
1000.0000 ug | ORAL_TABLET | Freq: Every day | ORAL | Status: DC
  Administered 2024-05-06 – 2024-05-10 (×5): 1000 ug via ORAL
  Filled 2024-05-06 (×5): qty 1

## 2024-05-06 MED ORDER — NICOTINE 21 MG/24HR TD PT24
21.0000 mg | MEDICATED_PATCH | Freq: Every day | TRANSDERMAL | Status: DC
  Administered 2024-05-06 – 2024-05-10 (×5): 21 mg via TRANSDERMAL
  Filled 2024-05-06 (×5): qty 1

## 2024-05-06 MED ORDER — HYDROXYZINE HCL 25 MG PO TABS
25.0000 mg | ORAL_TABLET | Freq: Four times a day (QID) | ORAL | Status: DC | PRN
  Administered 2024-05-08: 25 mg via ORAL
  Filled 2024-05-06: qty 1

## 2024-05-06 MED ORDER — ALUM & MAG HYDROXIDE-SIMETH 200-200-20 MG/5ML PO SUSP: 30.0000 mL | ORAL | Status: DC | PRN

## 2024-05-06 MED ORDER — MAGNESIUM HYDROXIDE 400 MG/5ML PO SUSP: 30.0000 mL | Freq: Every day | ORAL | Status: DC | PRN

## 2024-05-06 MED ORDER — LORAZEPAM 1 MG PO TABS
1.0000 mg | ORAL_TABLET | Freq: Four times a day (QID) | ORAL | Status: DC | PRN
Start: 2024-05-06 — End: 2024-05-10
  Administered 2024-05-06: 1 mg via ORAL
  Filled 2024-05-06: qty 1

## 2024-05-06 MED ORDER — INSULIN ASPART 100 UNIT/ML IJ SOLN
3.0000 [IU] | Freq: Three times a day (TID) | INTRAMUSCULAR | Status: DC
  Administered 2024-05-06 – 2024-05-10 (×10): 3 [IU] via SUBCUTANEOUS
  Filled 2024-05-06 (×3): qty 1

## 2024-05-06 MED ORDER — OLANZAPINE 10 MG IM SOLR: 5.0000 mg | Freq: Three times a day (TID) | INTRAMUSCULAR | Status: DC | PRN

## 2024-05-06 NOTE — Progress Notes (Signed)
 Mood/ affect:  Pleasant and cooperative. Sad affect.    Psych assessment:  Endorses anxiety and depression.  Denies SI/HI and AVH.   Behavior:  Present in the milieu.  Appropriate interaction with staff and peers.    Medication/ PRNs:  Compliant with scheduled medications. PRN PO medication given for agitation.   Pain: Denies  15 min checks in place for safety.     Pt became agitated and attempted to leave unit through emergency exit.  Pt able to be verbally deescalated and given PO medication for agitation.  Patient had become upset after learning she could not discharge today.

## 2024-05-06 NOTE — Group Note (Signed)
 Date:  05/06/2024 Time:  4:28 PM  Group Topic/Focus:  Outside Rec/Activities The purpose of this group is for patients to go outside and get fresh air while doing some outside activities with peers.    Participation Level:  Did Not Attend  Beatris ONEIDA Hasten 05/06/2024, 4:28 PM

## 2024-05-06 NOTE — Progress Notes (Signed)
 Patient required as needed Ativan  for anxiety per nursing staff.  Visited with patient discussed unit policies.  Coping mechanisms for managing anxiety utilize de-escalation techniques to calm patient's anxiety.  Following discussion patient requested to go to the room and take a nap.  Nursing was aware.  Advised attending physician.

## 2024-05-06 NOTE — Progress Notes (Signed)
   05/06/24 0400  Psych Admission Type (Psych Patients Only)  Admission Status Voluntary  Psychosocial Assessment  Patient Complaints None  Eye Contact Fair  Facial Expression Sad  Affect Appropriate to circumstance  Speech Soft  Interaction Assertive  Motor Activity Slow  Appearance/Hygiene In scrubs  Behavior Characteristics Cooperative;Appropriate to situation  Mood Sad  Thought Process  Coherency WDL  Content WDL  Delusions None reported or observed  Perception WDL  Hallucination None reported or observed  Judgment WDL  Confusion Mild

## 2024-05-06 NOTE — Progress Notes (Signed)
 Results of skin assessment:   Left eye red Irritation; abrasion left side of her head at her hairline area above her eye; bandaid on left lower arm, right wrist area & left lower leg. All areas healing after a fall last week.   Also she and upper denture and one tooth on the bottom.

## 2024-05-06 NOTE — Group Note (Signed)
 Date:  05/06/2024 Time:  8:35 PM  Group Topic/Focus:  Making Healthy Choices:   The focus of this group is to help patients identify negative/unhealthy choices they were using prior to admission and identify positive/healthier coping strategies to replace them upon discharge.  MHT made introductions. MHT discussed expectations on the unit. MHT informed patients that techs would be checking in throughout the night and not to be alarmed if they see someone looking in. MHT asked patients about concerns on the unit. MHT discussed aftercare plans and informed of resources in the community. MHT encouraged continued treatment, informing of [facility]'s mental health services as well as calling their respective insurance companies for healthcare providers in their network. MHT provided supportive counseling to address questions on how to get linked with mental health services.  Participation Level:  Active  Participation Quality:  Appropriate  Affect:  Appropriate  Cognitive:  Appropriate  Insight: Appropriate  Engagement in Group:  Engaged  Modes of Intervention:  Discussion  Additional Comments:    Francis JONETTA Boos 05/06/2024, 8:35 PM

## 2024-05-06 NOTE — Group Note (Signed)
 Date:  05/06/2024 Time:  3:37 PM  Group Topic/Focus:  Coping With Mental Health Crisis:   The purpose of this group is to help patients identify strategies for coping with mental health crisis.  Group discusses possible causes of crisis and ways to manage them effectively. Self Care:   The focus of this group is to help patients understand the importance of self-care in order to improve or restore emotional, physical, spiritual, interpersonal, and financial health. Wellness Toolbox:   The focus of this group is to discuss various aspects of wellness, balancing those aspects and exploring ways to increase the ability to experience wellness.  Patients will create a wellness toolbox for use upon discharge.    Participation Level:  Did Not Attend  Participation Quality:    Affect:    Cognitive:    Insight:   Engagement in Group:    Modes of Intervention:    Additional Comments:    Sehaj Kolden L Raybon Conard 05/06/2024, 3:37 PM

## 2024-05-06 NOTE — Inpatient Diabetes Management (Signed)
 Inpatient Diabetes Program Recommendations  AACE/ADA: New Consensus Statement on Inpatient Glycemic Control (2015)  Target Ranges:  Prepandial:   less than 140 mg/dL      Peak postprandial:   less than 180 mg/dL (1-2 hours)      Critically ill patients:  140 - 180 mg/dL   Lab Results  Component Value Date   GLUCAP 310 (H) 05/06/2024   HGBA1C >15.5 (H) 04/03/2024    Review of Glycemic Control  Diabetes history: DM2 Outpatient Diabetes medications: Lantus  40 units daily Metformin  1 gm bid Libre 3 CGM Current orders for Inpatient glycemic control: Novolog  3 units tid meal coverage Novolog  0-9 units tid, 0-5 units hs  Inpatient Diabetes Program Recommendations:   -Add Lantus  40 units now and daily  Thank you, Shakthi Scipio E. Romin Divita, RN, MSN, CNS, CDCES  Diabetes Coordinator Inpatient Glycemic Control Team Team Pager [phone] (8am-5pm) 05/06/2024 10:51 AM

## 2024-05-06 NOTE — ED Provider Notes (Signed)
-----------------------------------------   2:44 AM on 05/06/2024 -----------------------------------------  Patient admitted to [facility].   [name], MD 05/06/24 [phone]

## 2024-05-06 NOTE — Plan of Care (Signed)
   Problem: Education: Goal: Emotional status will improve Outcome: Progressing Goal: Mental status will improve Outcome: Progressing   Problem: Coping: Goal: Ability to demonstrate self-control will improve Outcome: Progressing   Problem: Safety: Goal: Periods of time without injury will increase Outcome: Progressing

## 2024-05-06 NOTE — Progress Notes (Signed)
 Reported was called for Archana @ 2:35a from [place], [place].  The patient arrived on the Geropsych unit @ 2:55a ambulating with a cane with security.  She was smiling with no s/sx. of acute distress.  She is pleasant, alert & oriented x 3.  Person, place, situation.  She replied the correct city & state, but also said I think.  She reported that she is forgetful at times.  Her response to some questions were I don't know.  When asked how she got to the ED she said I don't know..I think I called EMS.    She lives alone with a cat who her granddaughter, Chiquita, is caring for while she is here.  The patient does not have a good relationship with her daughter. She stated, she (her daughter) keeps my money.    She denied SI, HI, delusions, and hallucinations.  She reported having anxiety.  She was oriented to the unit & her room.  Initiate POC.  Q58m safety checks started as per order.

## 2024-05-06 NOTE — Plan of Care (Signed)
  Problem: Self-Concept: Goal: Level of anxiety will decrease Outcome: Not Progressing   Problem: Education: Goal: Emotional status will improve Outcome: Not Progressing Goal: Mental status will improve Outcome: Not Progressing

## 2024-05-06 NOTE — [facility] Suicide Risk Assessment (Signed)
 [facility] Admission Suicide Risk Assessment   Nursing information obtained from:    Demographic factors:    Current Mental Status:    Loss Factors:    Historical Factors:    Risk Reduction Factors:     Total Time spent with patient: 30 minutes Principal Problem: Suicidal ideations Diagnosis:  Principal Problem:   Suicidal ideations  Subjective Data: Tammy Acosta is a 68 year old female who presents to the ER due to anxiety and depression. Patient states she is having thoughts of ending her life. She hasn't had her medication in over a month. She recently got them filled but have been unable to get them from the pharmacy Patient is admitted to [facility] unit with Q15 min safety monitoring. Multidisciplinary team approach is offered. Medication management; group/milieu therapy is offered.   Continued Clinical Symptoms:    The Alcohol  Use Disorders Identification Test, Guidelines for Use in Primary Care, Second Edition.  World Science writer [facility]). Score between 0-7:  no or low risk or alcohol  related problems. Score between 8-15:  moderate risk of alcohol  related problems. Score between 16-19:  high risk of alcohol  related problems. Score 20 or above:  warrants further diagnostic evaluation for alcohol  dependence and treatment.   CLINICAL FACTORS:   Depression:   Hopelessness   Musculoskeletal: Strength & Muscle Tone: within normal limits Gait & Station: normal Patient leans: N/A  Psychiatric Specialty Exam:  Presentation  General Appearance:  Fairly Groomed  Eye Contact: Fair  Speech: Slow  Speech Volume: Decreased  Handedness: Right   Mood and Affect  Mood: Depressed  Affect: Depressed   Thought Process  Thought Processes: Linear  Descriptions of Associations:Intact  Orientation:Full (Time, Place and Person)  Thought Content:Logical  History of Schizophrenia/Schizoaffective disorder:No  Duration of Psychotic Symptoms:No data  recorded Hallucinations:No data recorded Ideas of Reference:None  Suicidal Thoughts:No data recorded Homicidal Thoughts:No data recorded  Sensorium  Memory:No data recorded Judgment: Fair  Insight: Fair   Chartered certified accountant: Fair  Attention Span: Fair  Recall: [employer] of Knowledge: Fair  Language: Fair   Psychomotor Activity  Psychomotor Activity:No data recorded  Assets  Assets: Desire for Improvement   Sleep  Sleep:No data recorded   Physical Exam: Physical Exam ROS Blood pressure (!) 145/94, pulse 68, temperature (!) 97.4 F (36.3 C), resp. rate 18, height 5' 2 (1.575 m), weight 52.6 kg, SpO2 100%. Body mass index is 21.22 kg/m.   COGNITIVE FEATURES THAT CONTRIBUTE TO RISK:  None    SUICIDE RISK:   Minimal: No identifiable suicidal ideation.  Patients presenting with no risk factors but with morbid ruminations; may be classified as minimal risk based on the severity of the depressive symptoms  PLAN OF CARE: Patient is admitted to [facility] psych unit with Q15 min safety monitoring. Multidisciplinary team approach is offered. Medication management; group/milieu therapy is offered.   I certify that inpatient services furnished can reasonably be expected to improve the patient's condition.   Allyn Foil, MD 05/06/2024, 1:47 PM

## 2024-05-06 NOTE — ED Notes (Signed)
Pt up to the bathroom and back to bed °

## 2024-05-06 NOTE — ED Notes (Signed)
 RN called the [facility] Unit to give report. That RN was with a pt and this RN was instructed to call back in about 10 min.

## 2024-05-06 NOTE — H&P (Signed)
 Psychiatric Admission Assessment Adult  Patient Identification: Tammy Acosta MRN:  [id number] Date of Evaluation:  05/06/2024 Chief Complaint:  Suicidal ideations [R45.851]   History of Present Illness:  Tammy Acosta is a 68 year old female who presents to the ER due to anxiety and depression. Patient states she is having thoughts of ending her life. She hasn't had her medication in over a month. She recently got them filled but have been unable to get them from the pharmacy Patient is admitted to [facility] unit with Q15 min safety monitoring. Multidisciplinary team approach is offered. Medication management; group/milieu therapy is offered.   Today on interview patient is able to talk about her feeling depression and having suicidal ideation few days ago.  She reports feeling better since she has been in the hospital.  She rates her depression as 2 out of 10, 10 being the worst and anxiety is 2 out of 10.  She denies any panic attacks.  She denies current SI/HI/plan.  She denies auditory/visual hallucinations.  She reports that she is able to eat and sleep well.  At home she reports that she was taking care of her ADLs and lives by herself.  She denies any nightmares or flashbacks.  She denies any history of abuse in her lifetime.  She denies any use of alcohol  or drugs.  But does smoke 1 pack/day of cigarettes.  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 No.  Has the patient been a risk to self in the past 6 months? No.  Has the patient been a risk to self within the distant past? No.  Is the patient a risk to others? No.  Has the patient been a risk to others in the past 6 months? No.  Has the patient been a risk to others within the distant past? No.   [place] Scale:  Flowsheet Row ED from 05/04/2024 in [facility] Emergency Department at [facility]  C-SSRS RISK CATEGORY Low Risk     Past Medical History:  Past Medical History:  Diagnosis Date   Altered mental status 02/04/2014   Arthritis    Colon polyps    Depression    Diabetes mellitus without complication (HCC)    Pt states she takes Insulin  and metformin .   Hypercholesteremia    Hyperlipidemia    Hypertension    Shingles    10/17   Spine disorder    Trigeminal herpes zoster (left V1 distribution) 06/24/2016   Viral upper respiratory illness 12/03/2015    Past Surgical History:  Procedure Laterality Date   ABDOMINAL HYSTERECTOMY     BACK SURGERY     CHOLECYSTECTOMY     KYPHOPLASTY     Family History:  Family History  Problem Relation Age of Onset   Heart disease Mother    Hypertension Mother    Cancer Mother  breast   Breast cancer Mother        early 55's   Heart disease Father    Arthritis Other        Parent, other relative   Colon cancer Other        Parent   Lung cancer Other        Other relative   Diabetes Other        Other relative   Uterine cancer Other        Other relative   Cancer Maternal Aunt        breast   Breast cancer Maternal Aunt    Cancer Maternal Aunt        ovarian   Breast cancer Cousin    Breast cancer Cousin    Breast cancer Cousin     Social History:  Social History    Substance and Sexual Activity  Alcohol  Use No     Social History   Substance and Sexual Activity  Drug Use No      Allergies:   Allergies  Allergen Reactions   Penicillins Anaphylaxis and Hives   Meloxicam     Tetracyclines & Related Rash   Lab Results:  Results for orders placed or performed during the hospital encounter of 05/06/24 (from the past 48 hours)  Glucose, capillary     Status: Abnormal   Collection Time: 05/06/24  7:27 AM  Result Value Ref Range   Glucose-Capillary 310 (H) 70 - 99 mg/dL    Comment: Glucose reference range applies only to samples taken after fasting for at least 8 hours.  Glucose, capillary     Status: Abnormal   Collection Time: 05/06/24 11:26 AM  Result Value Ref Range   Glucose-Capillary 269 (H) 70 - 99 mg/dL    Comment: Glucose reference range applies only to samples taken after fasting for at least 8 hours.    Blood Alcohol  level:  Lab Results  Component Value Date   [facility] <15 05/04/2024    Metabolic Disorder Labs:  Lab Results  Component Value Date   HGBA1C >15.5 (H) 04/03/2024   MPG >398 04/03/2024   No results found for: PROLACTIN Lab Results  Component Value Date   CHOL 71 05/07/2023   TRIG 307 (H) 05/07/2023   HDL 23 (L) 05/07/2023   CHOLHDL 3.1 05/07/2023   VLDL 36.1 (H) 11/13/2020   LDLCALC 16 05/07/2023   LDLCALC 37 01/29/2020    Current Medications: Current Facility-Administered Medications  Medication Dose Route Frequency Provider Last Rate Last Admin   acetaminophen  (TYLENOL ) tablet 650 mg  650 mg Oral Q6H PRN Bobbitt, Shalon E, NP       alum & mag hydroxide-simeth (MAALOX/MYLANTA) 200-200-20 MG/5ML suspension 30 mL  30 mL Oral Q4H PRN Bobbitt, Shalon E, NP       cyanocobalamin  (VITAMIN B12) tablet 1,000 mcg  1,000 mcg Oral Daily Anquan Azzarello, MD       insulin  aspart (novoLOG ) injection 0-5 Units  0-5 Units Subcutaneous QHS Bobbitt, Shalon E, NP       insulin  aspart (novoLOG ) injection 0-9 Units  0-9  Units Subcutaneous TID WC Bobbitt, Shalon E, NP   5 Units at 05/06/24 1205   insulin  aspart (novoLOG ) injection 3 Units  3 Units Subcutaneous TID WC Bobbitt, Shalon E, NP   3 Units at 05/06/24 0817   insulin  glargine (LANTUS ) injection 40 Units  40 Units Subcutaneous Daily Zeyad Delaguila, MD   40 Units at 05/06/24 1211   magnesium   hydroxide (MILK OF MAGNESIA) suspension 30 mL  30 mL Oral Daily PRN Bobbitt, Shalon E, NP       nicotine  (NICODERM CQ  - dosed in mg/24 hours) patch 21 mg  21 mg Transdermal Daily Melika Reder, MD   21 mg at 05/06/24 1208   sertraline  (ZOLOFT ) tablet 100 mg  100 mg Oral Daily Drisana Schweickert, MD       PTA Medications: Medications Prior to Admission  Medication Sig Dispense Refill Last Dose/Taking   blood glucose meter kit and supplies Dispense One touch meter.  E11.9 1 each 0    Blood Glucose Monitoring Suppl (ONE TOUCH ULTRA 2) w/Device KIT Dispense 1 meter to use to test blood glucose once daily. Dx code: E11.42. 1 kit 0    Continuous Glucose Receiver (FREESTYLE LIBRE 3 READER) DEVI 1 each by Does not apply route daily. Use to check glucose continuously. 1 each 0    Continuous Glucose Sensor (FREESTYLE LIBRE 3 SENSOR) MISC Place 1 sensor on the skin every 14 days. Use to check glucose continuously 2 each 0    cyanocobalamin  1000 MCG tablet Take 1 tablet (1,000 mcg total) by mouth daily. (Patient not taking: Reported on 05/04/2024)      gabapentin  (NEURONTIN ) 800 MG tablet TAKE 1 TABLET(800 MG) BY MOUTH FOUR TIMES DAILY 120 tablet 2    glucose blood (ONETOUCH ULTRA) test strip USE 1 STRIP TO CHECK GLUCOSE UP TO THREE TIMES DAILY AS DIRECTED. 100 each 0    insulin  glargine (LANTUS  SOLOSTAR) 100 UNIT/ML Solostar Pen Inject 40 Units into the skin daily. 12 mL 2    Insulin  Pen Needle (PEN NEEDLES) 32G X 5 MM MISC 1 pen by Does not apply route daily. Use to inject insulin  daily 100 each 4    Lancets (ONETOUCH DELICA PLUS LANCET33G) MISC USE   TO CHECK GLUCOSE UP TO THREE  TIMES DAILY AS DIRECTED 100 each 0    metFORMIN  (GLUCOPHAGE ) 1000 MG tablet TAKE 1 TABLET(1000 MG) BY MOUTH TWICE DAILY WITH A MEAL 60 tablet 0    rosuvastatin  (CRESTOR ) 40 MG tablet TAKE 1 TABLET(40 MG) BY MOUTH DAILY 30 tablet 0    sertraline  (ZOLOFT ) 100 MG tablet Take 2 tablets (200 mg total) by mouth daily. 60 tablet 0     Psychiatric Specialty Exam:  Presentation  General Appearance:  Appropriate for Environment; Casual  Eye Contact: Fair  Speech: Clear and Coherent  Speech Volume: Decreased    Mood and Affect  Mood: Depressed; Dysphoric  Affect: Depressed; Flat   Thought Process  Thought Processes: Coherent  Descriptions of Associations:Intact  Orientation:Full (Time, Place and Person)  Thought Content:Logical  Hallucinations:Hallucinations: None  Ideas of Reference:None  Suicidal Thoughts:Suicidal Thoughts: No  Homicidal Thoughts:Homicidal Thoughts: No   Sensorium  Memory: Immediate Fair; Recent Fair; Remote Fair  Judgment: Fair  Insight: Fair   Art therapist  Concentration: Fair  Attention Span: Fair  Recall: [employer] of Knowledge: Fair  Language: Fair   Psychomotor Activity  Psychomotor Activity: Psychomotor Activity: Normal   Assets  Assets: Desire for Improvement    Musculoskeletal: Strength & Muscle Tone: decreased Gait & Station: unsteady  Physical Exam: Physical Exam Vitals and nursing note reviewed.  HENT:     Head: Normocephalic.     Nose: Nose normal.  Cardiovascular:     Rate and Rhythm: Normal rate.     Pulses: Normal pulses.  Pulmonary:     Effort: Pulmonary effort is normal.  Musculoskeletal:  Cervical back: Normal range of motion.  Neurological:     Mental Status: She is alert.    Review of Systems  Constitutional: Negative.   HENT: Negative.    Eyes: Negative.   Cardiovascular: Negative.   Skin: Negative.    Blood pressure (!) 145/94, pulse 68, temperature (!) 97.4  F (36.3 C), resp. rate 18, height 5' 2 (1.575 m), weight 52.6 kg, SpO2 100%. Body mass index is 21.22 kg/m.  Principal Diagnosis: MDD moderate Rule out neurocognitive disorder  Clinical Decision Making: Patient currently admitted for depression and suicidal ideation.  Family reports underlying dementia and noncompliance with medication.  Patient will be monitored closely for stabilization  Treatment Plan Summary:  Safety and Monitoring:             -- Voluntary admission to inpatient psychiatric unit for safety, stabilization and treatment             -- Daily contact with patient to assess and evaluate symptoms and progress in treatment             -- Patient's case to be discussed in multi-disciplinary team meeting             -- Observation Level: q15 minute checks             -- Vital signs:  q12 hours             -- Precautions: suicide, elopement, and assault   2. Psychiatric Diagnoses and Treatment:               Zoloft  100 mg daily Hydroxyzine  as needed is added Remeron  15 mg nightly is added for insomnia   -- The risks/benefits/side-effects/alternatives to this medication were discussed in detail with the patient and time was given for questions. The patient consents to medication trial.                -- Metabolic profile and EKG monitoring obtained while on an atypical antipsychotic (BMI: Lipid Panel: HbgA1c: QTc:)              -- Encouraged patient to participate in unit milieu and in scheduled group therapies                            3. Medical Issues Being Addressed:      4. Discharge Planning:              -- Social work and case management to assist with discharge planning and identification of hospital follow-up needs prior to discharge             -- Estimated LOS: 5-7 days             -- Discharge Concerns: Need to establish a safety plan; Medication compliance and effectiveness             -- Discharge Goals: Return home with outpatient referrals follow  ups  Physician Treatment Plan for Primary Diagnosis: Suicidal ideations Long Term Goal(s): Improvement in symptoms so as ready for discharge  Short Term Goals: Ability to identify changes in lifestyle to reduce recurrence of condition will improve, Ability to verbalize feelings will improve, Ability to disclose and discuss suicidal ideas, Ability to demonstrate self-control will improve, and Ability to identify and develop effective coping behaviors will improve  Physician Treatment Plan for Secondary Diagnosis: Principal Problem:   Suicidal ideations  Long Term Goal(s): Improvement in symptoms so as  ready for discharge  Short Term Goals: Ability to identify changes in lifestyle to reduce recurrence of condition will improve, Ability to verbalize feelings will improve, Ability to disclose and discuss suicidal ideas, Ability to demonstrate self-control will improve, Ability to identify and develop effective coping behaviors will improve, and Ability to maintain clinical measurements within normal limits will improve  I certify that inpatient services furnished can reasonably be expected to improve the patient's condition.    Saidy Ormand, MD 9/13/20251:50 PM

## 2024-05-07 DIAGNOSIS — F331 Major depressive disorder, recurrent, moderate: Secondary | ICD-10-CM

## 2024-05-07 LAB — GLUCOSE, CAPILLARY
Glucose-Capillary: 89 mg/dL (ref 70–99)
Glucose-Capillary: 225 mg/dL — ABNORMAL HIGH (ref 70–99)
Glucose-Capillary: 238 mg/dL — ABNORMAL HIGH (ref 70–99)
Glucose-Capillary: 169 mg/dL — ABNORMAL HIGH (ref 70–99)
Glucose-Capillary: 277 mg/dL — ABNORMAL HIGH (ref 70–99)

## 2024-05-07 MED ORDER — POLYVINYL ALCOHOL 1.4 % OP SOLN
1.0000 [drp] | OPHTHALMIC | Status: DC | PRN
  Administered 2024-05-07 – 2024-05-09 (×3): 1 [drp] via OPHTHALMIC

## 2024-05-07 MED ORDER — HYDROXYZINE HCL 10 MG PO TABS
10.0000 mg | ORAL_TABLET | Freq: Three times a day (TID) | ORAL | Status: DC
  Administered 2024-05-07 – 2024-05-10 (×9): 10 mg via ORAL
  Filled 2024-05-07 (×9): qty 1

## 2024-05-07 NOTE — Progress Notes (Signed)
   05/06/24 2100  Psych Admission Type (Psych Patients Only)  Admission Status Voluntary  Psychosocial Assessment  Patient Complaints Anxiety  Eye Contact Fair  Facial Expression Anxious  Affect Anxious  Speech Logical/coherent  Interaction Assertive  Motor Activity Slow  Appearance/Hygiene In scrubs  Behavior Characteristics Anxious  Mood Pleasant  Thought Process  Coherency WDL  Content WDL  Delusions None reported or observed  Perception WDL  Hallucination None reported or observed  Judgment Impaired  Confusion Mild  Danger to Self  Current suicidal ideation? Denies

## 2024-05-07 NOTE — Progress Notes (Signed)
 Patient gave verbal permission (during visitation hours) to have her daughter take her purse, wallet and cell phone home.

## 2024-05-07 NOTE — Group Note (Signed)
 Date:  05/07/2024 Time:  10:15 PM  Group Topic/Focus:  Wrap-Up Group:   The focus of this group is to help patients review their daily goal of treatment and discuss progress on daily workbooks.    Participation Level:  Did Not Attend  Participation Quality:     Affect:     Cognitive:     Insight: None  Engagement in Group:  None  Modes of Intervention:     Additional Comments:    [name] 05/07/2024, 10:15 PM

## 2024-05-07 NOTE — Plan of Care (Signed)
  Problem: Self-Concept: Goal: Level of anxiety will decrease Outcome: Progressing   Problem: Education: Goal: Emotional status will improve Outcome: Progressing   Problem: Coping: Goal: Ability to demonstrate self-control will improve Outcome: Progressing

## 2024-05-07 NOTE — Group Note (Signed)
 Date:  05/07/2024 Time:  11:04 AM  Group Topic/Focus:  Goals/Bingo Group:   The focus of this group is to help patients establish daily goals to achieve during treatment and discuss how the patient can incorporate goal setting into their daily lives to aide in recovery. Pts also played bingo and won small prizes     Participation Level:  Active  Participation Quality:  Appropriate  Affect:  Appropriate  Cognitive:  Appropriate  Insight: Appropriate  Engagement in Group:  Engaged  Modes of Intervention:  Activity and Discussion  Additional Comments:    Tammy Acosta 05/07/2024, 11:04 AM

## 2024-05-07 NOTE — Group Note (Signed)
 Date:  05/07/2024 Time:  5:11 PM  Group Topic/Focus:  Healthy Communication:   The focus of this group is to discuss communication, barriers to communication, as well as healthy ways to communicate with others.    Participation Level:  Active  Participation Quality:  Appropriate  Affect:  Appropriate  Cognitive:  Alert  Insight: Appropriate  Engagement in Group:  Engaged  Modes of Intervention:  Activity  Additional Comments:  N/A  Harlene LITTIE Gavel 05/07/2024, 5:11 PM

## 2024-05-07 NOTE — B[facility] Counselor (Signed)
 Adult Comprehensive Assessment  Patient ID: Tammy Acosta, female   DOB: 02/26/1956, 68 y.o.   MRN: [id number]  Information Source: Information source: Patient  Current Stressors:  Patient states their primary concerns and needs for treatment are:: I think I got real anxious and upset and made some stupid comments Patient states their goals for this hospitilization and ongoing recovery are:: I think I want to try to be in control of my own anxiety Educational / Learning stressors: None reports Employment / Job issues: None reports Family Relationships: None reports Financial / Lack of resources (include bankruptcy): None reports Housing / Lack of housing: None reports Physical health (include injuries & life threatening diseases): None reports Social relationships: Somewhat. My daughter and I don't always get along Substance abuse: None reports Bereavement / Loss: None reports  Living/Environment/Situation:  Living Arrangements: Alone Living conditions (as described by patient or guardian): My living conditions are very nice Who else lives in the home?: no one How long has patient lived in current situation?: About 6 months What is atmosphere in current home: Comfortable, Loving  Family History:  Marital status: Divorced Divorced, when?: Many, many years ago What types of issues is patient dealing with in the relationship?: n/a Does patient have children?: Yes How many children?: 2 How is patient's relationship with their children?: Strained  Childhood History:  By whom was/is the patient raised?: Mother, Father Description of patient's relationship with caregiver when they were a child: Pt reports having a good relationship with both parents Patient's description of current relationship with people who raised him/her: n/a - deceased How were you disciplined when you got in trouble as a child/adolescent?: I had to sit in a chair in timeout or sometimes stand in the  corner Does patient have siblings?: No Did patient suffer any verbal/emotional/physical/sexual abuse as a child?: No Did patient suffer from severe childhood neglect?: No Has patient ever been sexually abused/assaulted/raped as an adolescent or adult?: No Was the patient ever a victim of a crime or a disaster?: No Witnessed domestic violence?: No Has patient been affected by domestic violence as an adult?: No  Education:  Highest grade of school patient has completed: Some college Currently a Consulting civil engineer?: No Learning disability?: No  Employment/Work Situation:   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[employer], Actor SSDI Does patient have a Lawyer or guardian?: No  Alcohol /Substance Abuse:   Alcohol /Substance Abuse Treatment Hx: Denies past history  Social Support System:   Forensic psychologist System: Production assistant, radio System: Metallurgist, good friends, my children Type of faith/religion: Baptist How does patient's faith help to cope with current illness?: I pray alot  Leisure/Recreation:   Do You Have Hobbies?: Yes Leisure and Hobbies: Love to read, cross stitching, and I love to bake  Strengths/Needs:   What is the patient's perception of their strengths?: That I am strong and supportive to others. I'm mentally balanced Patient states these barriers may affect/interfere with their treatment: I can't think of any barriers Patient states these barriers may affect their return to the community: None reported  Discharge Plan:   Currently receiving community mental health services: No Patient states concerns and preferences for aftercare planning are: Patient wants to be connected with  [facility] or  provider for ongoing mental health tx Patient states they will know when they are safe and ready for discharge when: I feel I am safe and ready to discharge now Does patient have access to transportation?: Yes (granddaughter will provide transportation) Does patient have financial barriers related to discharge medications?: Yes Will patient be returning to same living situation after discharge?: Yes  Summary/Recommendations:   Summary and Recommendations (to be completed by the evaluator): Tammy Acosta  Patient is a 68 year old female who presented to the ER due to anxiety and depression. Patient states she is having thoughts of ending her life. She hasn't had her medication in over a month. Patient states she wants to start back with therapy and hope she can become stabilized with her medications. Recommendations include: crisis stabilization, therapeutic milieu, encourage group attendance and participation, medication management for detox/mood stabilization and development of comprehensive mental wellness/sobriety plan.    Tammy Acosta. 05/07/2024

## 2024-05-07 NOTE — Progress Notes (Signed)
 [facility] MD Progress Note  05/07/2024 1:39 PM Tammy Acosta  MRN:  [id number] Tammy Acosta is a 68 year old female who presents to the ER due to anxiety and depression. Patient states she is having thoughts of ending her life. She hasn't had her medication in over a month. She recently got them filled but have been unable to get them from the pharmacy Patient is admitted to [facility] unit with Q15 min safety monitoring. Multidisciplinary team approach is offered. Medication management; group/milieu therapy is offered.   Subjective:  Chart reviewed, case discussed in multidisciplinary meeting, patient seen during rounds.  Today on interview patient is noted to be resting in bed.  She did acknowledge that she felt anxious and wanted to leave yesterday.  Provider and patient discussed about her family concerns about patient's depression, ongoing memory problems.  Patient did acknowledge that she lives by herself.  Provider educated patient that her family is requesting for postdischarge resources where she can have aide coming home and help her with medications.  Patient denies SI/HI/plan and denies hallucinations.   Sleep: Fair  Appetite:  Fair  Past Psychiatric History: see h&P Family History:  Family History  Problem Relation Age of Onset   Heart disease Mother    Hypertension Mother    Cancer Mother        breast   Breast cancer Mother        early 78's   Heart disease Father    Arthritis Other        Parent, other relative   Colon cancer Other        Parent   Lung cancer Other        Other relative   Diabetes Other        Other relative   Uterine cancer Other        Other relative   Cancer Maternal Aunt        breast   Breast cancer Maternal Aunt    Cancer Maternal Aunt        ovarian   Breast cancer Cousin    Breast cancer Cousin    Breast cancer Cousin    Social History:  Social History   Substance and Sexual Activity  Alcohol  Use No     Social History    Substance and Sexual Activity  Drug Use No    Social History   Socioeconomic History   Marital status: Divorced    Spouse name: Not on file   Number of children: Not on file   Years of education: Not on file   Highest education level: Associate degree: occupational, Scientist, product/process development, or vocational program  Occupational History   Not on file  Tobacco Use   Smoking status: Every Day    Current packs/day: 0.00    Types: Cigarettes   Smokeless tobacco: Never  Substance and Sexual Activity   Alcohol  use: No   Drug use: No   Sexual activity: Never  Other Topics Concern   Not on file  Social History Narrative   Not on file   Social Drivers of Health   Financial Resource Strain: Low Risk  (03/13/2024)   Overall Financial Resource Strain (CARDIA)    Difficulty of Paying Living Expenses: Not hard at all  Food Insecurity: No Food Insecurity (05/06/2024)   Hunger Vital Sign    Worried About Running Out of Food in the Last Year: Never true    Ran Out of Food in the Last Year: Never true  Transportation Needs: No Transportation Needs (05/06/2024)   PRAPARE - Administrator, Civil Service (Medical): No    Lack of Transportation (Non-Medical): No  Physical Activity: Insufficiently Active (03/13/2024)   Exercise Vital Sign    Days of Exercise per Week: 3 days    Minutes of Exercise per Session: 20 min  Stress: No Stress Concern Present (03/13/2024)   [name] of Occupational Health - Occupational Stress Questionnaire    Feeling of Stress: Not at all  Social Connections: Socially Isolated (05/06/2024)   Social Connection and Isolation Panel    Frequency of Communication with Friends and Family: More than three times a week    Frequency of Social Gatherings with Friends and Family: More than three times a week    Attends Religious Services: Never    Database administrator or Organizations: No    Attends Banker Meetings: Never    Marital Status: Never  married   Past Medical History:  Past Medical History:  Diagnosis Date   Altered mental status 02/04/2014   Arthritis    Colon polyps    Depression    Diabetes mellitus without complication (HCC)    Pt states she takes Insulin  and metformin .   Hypercholesteremia    Hyperlipidemia    Hypertension    Shingles    10/17   Spine disorder    Trigeminal herpes zoster (left V1 distribution) 06/24/2016   Viral upper respiratory illness 12/03/2015    Past Surgical History:  Procedure Laterality Date   ABDOMINAL HYSTERECTOMY     BACK SURGERY     CHOLECYSTECTOMY     KYPHOPLASTY      Current Medications: Current Facility-Administered Medications  Medication Dose Route Frequency Provider Last Rate Last Admin   acetaminophen  (TYLENOL ) tablet 650 mg  650 mg Oral Q6H PRN Bobbitt, Shalon E, NP   650 mg at 05/06/24 2042   alum & mag hydroxide-simeth (MAALOX/MYLANTA) 200-200-20 MG/5ML suspension 30 mL  30 mL Oral Q4H PRN Bobbitt, Shalon E, NP       cyanocobalamin  (VITAMIN B12) tablet 1,000 mcg  1,000 mcg Oral Daily Misa Fedorko, MD   1,000 mcg at 05/07/24 0915   hydrOXYzine  (ATARAX ) tablet 25 mg  25 mg Oral Q6H PRN Yanel Dombrosky, MD       insulin  aspart (novoLOG ) injection 0-5 Units  0-5 Units Subcutaneous QHS Bobbitt, Shalon E, NP   4 Units at 05/06/24 2159   insulin  aspart (novoLOG ) injection 0-9 Units  0-9 Units Subcutaneous TID WC Bobbitt, Shalon E, NP   3 Units at 05/07/24 1219   insulin  aspart (novoLOG ) injection 3 Units  3 Units Subcutaneous TID WC Bobbitt, Shalon E, NP   3 Units at 05/07/24 1220   insulin  glargine (LANTUS ) injection 40 Units  40 Units Subcutaneous Daily Rusell Meneely, MD   40 Units at 05/07/24 0934   LORazepam  (ATIVAN ) tablet 1 mg  1 mg Oral Q6H PRN Duante Arocho, MD   1 mg at 05/06/24 1449   magnesium  hydroxide (MILK OF MAGNESIA) suspension 30 mL  30 mL Oral Daily PRN Bobbitt, Shalon E, NP       mirtazapine  (REMERON  SOL-TAB) disintegrating tablet 15 mg  15 mg Oral  QHS Jehiel Koepp, MD   15 mg at 05/06/24 2201   nicotine  (NICODERM CQ  - dosed in mg/24 hours) patch 21 mg  21 mg Transdermal Daily Roselina Burgueno, MD   21 mg at 05/07/24 0916   OLANZapine  (ZYPREXA ) injection 5 mg  5 mg Intramuscular Q8H PRN Caral Whan, MD       OLANZapine  (ZYPREXA ) tablet 5 mg  5 mg Oral Q8H PRN Broady Lafoy, MD       sertraline  (ZOLOFT ) tablet 100 mg  100 mg Oral Daily Lounette Sloan, MD   100 mg at 05/07/24 9084    Lab Results:  Results for orders placed or performed during the hospital encounter of 05/06/24 (from the past 48 hours)  Glucose, capillary     Status: Abnormal   Collection Time: 05/06/24  7:27 AM  Result Value Ref Range   Glucose-Capillary 310 (H) 70 - 99 mg/dL    Comment: Glucose reference range applies only to samples taken after fasting for at least 8 hours.  Glucose, capillary     Status: Abnormal   Collection Time: 05/06/24 11:26 AM  Result Value Ref Range   Glucose-Capillary 269 (H) 70 - 99 mg/dL    Comment: Glucose reference range applies only to samples taken after fasting for at least 8 hours.  Glucose, capillary     Status: Abnormal   Collection Time: 05/06/24  4:27 PM  Result Value Ref Range   Glucose-Capillary 306 (H) 70 - 99 mg/dL    Comment: Glucose reference range applies only to samples taken after fasting for at least 8 hours.  Glucose, capillary     Status: Abnormal   Collection Time: 05/06/24  8:18 PM  Result Value Ref Range   Glucose-Capillary 321 (H) 70 - 99 mg/dL    Comment: Glucose reference range applies only to samples taken after fasting for at least 8 hours.  Glucose, capillary     Status: None   Collection Time: 05/07/24  7:19 AM  Result Value Ref Range   Glucose-Capillary 89 70 - 99 mg/dL    Comment: Glucose reference range applies only to samples taken after fasting for at least 8 hours.  Glucose, capillary     Status: Abnormal   Collection Time: 05/07/24  9:31 AM  Result Value Ref Range    Glucose-Capillary 225 (H) 70 - 99 mg/dL    Comment: Glucose reference range applies only to samples taken after fasting for at least 8 hours.  Glucose, capillary     Status: Abnormal   Collection Time: 05/07/24 11:26 AM  Result Value Ref Range   Glucose-Capillary 238 (H) 70 - 99 mg/dL    Comment: Glucose reference range applies only to samples taken after fasting for at least 8 hours.    Blood Alcohol  level:  Lab Results  Component Value Date   [facility] <15 05/04/2024    Metabolic Disorder Labs: Lab Results  Component Value Date   HGBA1C >15.5 (H) 04/03/2024   MPG >398 04/03/2024   No results found for: PROLACTIN Lab Results  Component Value Date   CHOL 71 05/07/2023   TRIG 307 (H) 05/07/2023   HDL 23 (L) 05/07/2023   CHOLHDL 3.1 05/07/2023   VLDL 36.1 (H) 11/13/2020   LDLCALC 16 05/07/2023   LDLCALC 37 01/29/2020    Physical Findings: AIMS:  , ,  ,  ,    CIWA:    COWS:      Psychiatric Specialty Exam:  Presentation  General Appearance:  Appropriate for Environment; Casual  Eye Contact: Fair  Speech: Clear and Coherent  Speech Volume: Decreased    Mood and Affect  Mood: Depressed; Dysphoric  Affect: Depressed; Flat   Thought Process  Thought Processes: Coherent  Descriptions of Associations:Intact  Orientation:Full (Time, Place and  Person)  Thought Content:Logical  Hallucinations:Hallucinations: None  Ideas of Reference:None  Suicidal Thoughts:Suicidal Thoughts: No  Homicidal Thoughts:Homicidal Thoughts: No   Sensorium  Memory: Immediate Fair; Recent Fair; Remote Fair  Judgment: Fair  Insight: Fair   Art therapist  Concentration: Fair  Attention Span: Fair  Recall: [employer] of Knowledge: Fair  Language: Fair   Psychomotor Activity  Psychomotor Activity: Psychomotor Activity: Normal  Musculoskeletal: Strength & Muscle Tone: within normal limits Gait & Station: unsteady Assets  Assets: Desire  for Improvement    Physical Exam: Physical Exam Vitals and nursing note reviewed.    ROS Blood pressure 135/71, pulse 73, temperature 98 F (36.7 C), resp. rate 16, height 5' 2 (1.575 m), weight 52.6 kg, SpO2 100%. Body mass index is 21.22 kg/m.  Diagnosis: Principal Problem:   MDD (major depressive disorder), recurrent episode, moderate (HCC)  Clinical Decision Making: Patient currently admitted for depression and suicidal ideation.  Family reports underlying dementia and noncompliance with medication.  Patient will be monitored closely for stabilization  Treatment Plan Summary:  Safety and Monitoring:             -- Voluntary admission to inpatient psychiatric unit for safety, stabilization and treatment             -- Daily contact with patient to assess and evaluate symptoms and progress in treatment             -- Patient's case to be discussed in multi-disciplinary team meeting             -- Observation Level: q15 minute checks             -- Vital signs:  q12 hours             -- Precautions: suicide, elopement, and assault   2. Psychiatric Diagnoses and Treatment:               Zoloft  100 mg daily Hydroxyzine  as needed is added Remeron  15 mg nightly is added for insomnia   -- The risks/benefits/side-effects/alternatives to this medication were discussed in detail with the patient and time was given for questions. The patient consents to medication trial.                -- Metabolic profile and EKG monitoring obtained while on an atypical antipsychotic (BMI: Lipid Panel: HbgA1c: QTc:)              -- Encouraged patient to participate in unit milieu and in scheduled group therapies    4. Discharge Planning:   -- Social work and case management to assist with discharge planning and identification of hospital follow-up needs prior to discharge  -- Estimated LOS: 3-4 days  Rushil Kimbrell, MD 05/07/2024, 1:39 PM

## 2024-05-07 NOTE — Progress Notes (Signed)
 Mood: Confused and irritable at times.     Psych assessment:  Denies anxiety and depression.  Denies SI/HI and AVH.   Interaction / Group attendance:  Present in the milieu for meals and both groups. Appropriate interaction with peers and staff.   Medication/ PRNs:  Compliant with scheduled medication. PRN eye drops and pain medication given as ordered.   Pain: 8/10 in back and legs  15 min checks in place for safety.

## 2024-05-07 NOTE — Plan of Care (Signed)

## 2024-05-07 NOTE — BHH Counselor (Signed)
 Adult Comprehensive Assessment  Patient ID: Tammy Acosta, female   DOB: 11-Mar-1956, 68 y.o.   MRN: [id number]  Information Source: Information source: Patient  Current Stressors:  Patient states their primary concerns and needs for treatment are:: I think I got real anxious and upset and made some stupid comments Patient states their goals for this hospitilization and ongoing recovery are:: I think I want to try to be in control of my own anxiety Educational / Learning stressors: None reports Employment / Job issues: None reports Family Relationships: None reports Financial / Lack of resources (include bankruptcy): None reports Housing / Lack of housing: None reports Physical health (include injuries & life threatening diseases): None reports Social relationships: Somewhat. My daughter and I don't always get along Substance abuse: None reports Bereavement / Loss: None reports  Living/Environment/Situation:  Living Arrangements: Alone Living conditions (as described by patient or guardian): My living conditions are very nice Who else lives in the home?: no one How long has patient lived in current situation?: About 6 months What is atmosphere in current home: Comfortable, Loving  Family History:  Marital status: Divorced Divorced, when?: Many, many years ago What types of issues is patient dealing with in the relationship?: n/a Does patient have children?: Yes How many children?: 2 How is patient's relationship with their children?: Strained  Childhood History:  By whom was/is the patient raised?: Mother, Father Description of patient's relationship with caregiver when they were a child: Pt reports having a good relationship with both parents Patient's description of current relationship with people who raised him/her: n/a - deceased How were you disciplined when you got in trouble as a child/adolescent?: I had to sit in a chair in timeout or sometimes stand in the  corner Does patient have siblings?: No Did patient suffer any verbal/emotional/physical/sexual abuse as a child?: No Did patient suffer from severe childhood neglect?: No Has patient ever been sexually abused/assaulted/raped as an adolescent or adult?: No Was the patient ever a victim of a crime or a disaster?: No Witnessed domestic violence?: No Has patient been affected by domestic violence as an adult?: No  Education:  Highest grade of school patient has completed: Some college Currently a Consulting civil engineer?: No Learning disability?: No  Employment/Work Situation:   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[employer], Actor SSDI Does patient have a Lawyer or guardian?: No  Alcohol /Substance Abuse:   Alcohol /Substance Abuse Treatment Hx: Denies past history  Social Support System:   Forensic psychologist System: Production assistant, radio System: Metallurgist, good friends, my children Type of faith/religion: Baptist How does patient's faith help to cope with current illness?: I pray alot  Leisure/Recreation:   Do You Have Hobbies?: Yes Leisure and Hobbies: Love to read, cross stitching, and I love to bake  Strengths/Needs:   What is the patient's perception of their strengths?: That I am strong and supportive to others. I'm mentally balanced Patient states these barriers may affect/interfere with their treatment: I can't think of any barriers Patient states these barriers may affect their return to the community: None reported  Discharge Plan:   Currently receiving community mental health services: No Patient states concerns and preferences for aftercare planning are: Patient wants to be connected with  [facility] or [place] provider for ongoing mental health tx Patient states they will know when they are safe and ready for discharge when: I feel I am safe and ready to discharge now Does patient have access to transportation?: Yes (granddaughter will provide transportation) Does patient have financial barriers related to discharge medications?: Yes Will patient be returning to same living situation after discharge?: Yes  Summary/Recommendations:   Patient is a 68 year old female who presented to [facility] as a voluntary walk-in, accompanied by his wife, with c/o depression and SI without plan or intent. Patient reports depression started after being accused of an incident at work (did not elaborate further), and having law enforcement involved. Patient is interested in re-engaging in mental health treatment and wants to become regulated with his medications. Recommendations include: crisis stabilization, therapeutic milieu, encourage group attendance and participation, medication management for detox/mood stabilization and development of comprehensive mental wellness/sobriety plan.     Aldo HERO Adom Schoeneck. 05/07/2024

## 2024-05-08 LAB — GLUCOSE, CAPILLARY
Glucose-Capillary: 117 mg/dL — ABNORMAL HIGH (ref 70–99)
Glucose-Capillary: 235 mg/dL — ABNORMAL HIGH (ref 70–99)
Glucose-Capillary: 232 mg/dL — ABNORMAL HIGH (ref 70–99)
Glucose-Capillary: 282 mg/dL — ABNORMAL HIGH (ref 70–99)

## 2024-05-08 MED ORDER — LIVING WELL WITH DIABETES BOOK
Freq: Once | Status: DC
  Filled 2024-05-08: qty 1

## 2024-05-08 MED ORDER — IBUPROFEN 200 MG PO TABS
600.0000 mg | ORAL_TABLET | Freq: Once | ORAL | Status: AC
  Administered 2024-05-08: 600 mg via ORAL
  Filled 2024-05-08: qty 3

## 2024-05-08 MED ORDER — IBUPROFEN 200 MG PO TABS: 400.0000 mg | ORAL_TABLET | Freq: Once | ORAL | Status: DC

## 2024-05-08 NOTE — Group Note (Signed)
 Recreation Therapy Group Note   Group Topic:Relaxation  Group Date: 05/08/2024 Start Time: 1500 End Time: 1535 Facilitators: Celestia Jeoffrey BRAVO, LRT, CTRS Location: Dayroom  Group Description: PMR (Progressive Muscle Relaxation). LRT educates patients on what PMR is and the benefits that come from it. Patients are asked to sit with their feet flat on the floor while sitting up and all the way back in their chair, if possible. LRT and pts follow a prompt through a speaker that requires you to tense and release different muscles in their body and focus on their breathing. During session, lights are off and soft music is being played. Pts are given a stress ball to use if needed.   Goal Area(s) Addressed:  Patients will be able to describe progressive muscle relaxation.  Patient will practice using relaxation technique. Patient will identify a new coping skill.  Patient will follow multistep directions to reduce anxiety and stress.   Affect/Mood: N/A   Participation Level: Did not attend    Clinical Observations/Individualized Feedback: Patient did not attend group.  Plan: Continue to engage patient in RT group sessions 2-3x/week.   Jeoffrey BRAVO Celestia, LRT, CTRS 05/08/2024 5:01 PM

## 2024-05-08 NOTE — Group Note (Signed)
 Recreation Therapy Group Note   Group Topic:General Recreation  Group Date: 05/08/2024 Start Time: 1535 End Time: 1630 Facilitators: Celestia Jeoffrey BRAVO, LRT, CTRS Location: Courtyard  Group Description: Outdoor Recreation. Patients had the option to play corn hole, ring toss, UNO, or listening to music while outside in the courtyard getting fresh air and sunlight. Patients helped water and prune the raised garden beds. LRT and patients discussed things that they enjoy doing in their free time outside of the hospital. LRT encouraged patients to drink water after being active and getting their heart rate up.   Goal Area(s) Addressed: Patient will identify leisure interests.  Patient will practice healthy decision making. Patient will engage in recreation activity.  Affect/Mood: N/A   Participation Level: Did not attend    Clinical Observations/Individualized Feedback: Patient did not attend group.   Plan: Continue to engage patient in RT group sessions 2-3x/week.   Jeoffrey BRAVO Celestia, LRT, CTRS 05/08/2024 5:18 PM

## 2024-05-08 NOTE — BH IP Treatment Plan (Signed)
 Interdisciplinary Treatment and Diagnostic Plan Update  05/08/2024 Time of Session: 11:03AM Tammy Acosta MRN: [id number]  Principal Diagnosis: MDD (major depressive disorder), recurrent episode, moderate (HCC)  Secondary Diagnoses: Principal Problem:   MDD (major depressive disorder), recurrent episode, moderate (HCC)   Current Medications:  Current Facility-Administered Medications  Medication Dose Route Frequency Provider Last Rate Last Admin   acetaminophen  (TYLENOL ) tablet 650 mg  650 mg Oral Q6H PRN Bobbitt, Shalon E, NP   650 mg at 05/08/24 0908   alum & mag hydroxide-simeth (MAALOX/MYLANTA) 200-200-20 MG/5ML suspension 30 mL  30 mL Oral Q4H PRN Bobbitt, Shalon E, NP       artificial tears ophthalmic solution 1 drop  1 drop Both Eyes PRN Jadapalle, Sree, MD   1 drop at 05/07/24 1937   cyanocobalamin  (VITAMIN B12) tablet 1,000 mcg  1,000 mcg Oral Daily Jadapalle, Sree, MD   1,000 mcg at 05/08/24 0908   hydrOXYzine  (ATARAX ) tablet 10 mg  10 mg Oral TID Jadapalle, Sree, MD   10 mg at 05/08/24 0908   hydrOXYzine  (ATARAX ) tablet 25 mg  25 mg Oral Q6H PRN Jadapalle, Sree, MD       insulin  aspart (novoLOG ) injection 0-5 Units  0-5 Units Subcutaneous QHS Bobbitt, Shalon E, NP   3 Units at 05/07/24 2114   insulin  aspart (novoLOG ) injection 0-9 Units  0-9 Units Subcutaneous TID WC Bobbitt, Shalon E, NP   2 Units at 05/07/24 1722   insulin  aspart (novoLOG ) injection 3 Units  3 Units Subcutaneous TID WC Bobbitt, Shalon E, NP   3 Units at 05/07/24 1724   insulin  glargine (LANTUS ) injection 40 Units  40 Units Subcutaneous Daily Jadapalle, Sree, MD   40 Units at 05/08/24 0908   living well with diabetes book MISC   Does not apply Once Jadapalle, Sree, MD       LORazepam  (ATIVAN ) tablet 1 mg  1 mg Oral Q6H PRN Jadapalle, Sree, MD   1 mg at 05/06/24 1449   magnesium  hydroxide (MILK OF MAGNESIA) suspension 30 mL  30 mL Oral Daily PRN Bobbitt, Shalon E, NP       mirtazapine  (REMERON  SOL-TAB)  disintegrating tablet 15 mg  15 mg Oral QHS Jadapalle, Sree, MD   15 mg at 05/07/24 2115   nicotine  (NICODERM CQ  - dosed in mg/24 hours) patch 21 mg  21 mg Transdermal Daily Jadapalle, Sree, MD   21 mg at 05/08/24 9082   OLANZapine  (ZYPREXA ) injection 5 mg  5 mg Intramuscular Q8H PRN Jadapalle, Sree, MD       OLANZapine  (ZYPREXA ) tablet 5 mg  5 mg Oral Q8H PRN Jadapalle, Sree, MD       sertraline  (ZOLOFT ) tablet 100 mg  100 mg Oral Daily Jadapalle, Sree, MD   100 mg at 05/08/24 0908   PTA Medications: Medications Prior to Admission  Medication Sig Dispense Refill Last Dose/Taking   blood glucose meter kit and supplies Dispense One touch meter.  E11.9 1 each 0    Blood Glucose Monitoring Suppl (ONE TOUCH ULTRA 2) w/Device KIT Dispense 1 meter to use to test blood glucose once daily. Dx code: E11.42. 1 kit 0    Continuous Glucose Receiver (FREESTYLE LIBRE 3 READER) DEVI 1 each by Does not apply route daily. Use to check glucose continuously. 1 each 0    Continuous Glucose Sensor (FREESTYLE LIBRE 3 SENSOR) MISC Place 1 sensor on the skin every 14 days. Use to check glucose continuously 2 each 0  cyanocobalamin  1000 MCG tablet Take 1 tablet (1,000 mcg total) by mouth daily. (Patient not taking: Reported on 05/04/2024)      gabapentin  (NEURONTIN ) 800 MG tablet TAKE 1 TABLET(800 MG) BY MOUTH FOUR TIMES DAILY 120 tablet 2    glucose blood (ONETOUCH ULTRA) test strip USE 1 STRIP TO CHECK GLUCOSE UP TO THREE TIMES DAILY AS DIRECTED. 100 each 0    insulin  glargine (LANTUS  SOLOSTAR) 100 UNIT/ML Solostar Pen Inject 40 Units into the skin daily. 12 mL 2    Insulin  Pen Needle (PEN NEEDLES) 32G X 5 MM MISC 1 pen by Does not apply route daily. Use to inject insulin  daily 100 each 4    Lancets (ONETOUCH DELICA PLUS LANCET33G) MISC USE   TO CHECK GLUCOSE UP TO THREE TIMES DAILY AS DIRECTED 100 each 0    metFORMIN  (GLUCOPHAGE ) 1000 MG tablet TAKE 1 TABLET(1000 MG) BY MOUTH TWICE DAILY WITH A MEAL 60 tablet 0     rosuvastatin  (CRESTOR ) 40 MG tablet TAKE 1 TABLET(40 MG) BY MOUTH DAILY 30 tablet 0    sertraline  (ZOLOFT ) 100 MG tablet Take 2 tablets (200 mg total) by mouth daily. 60 tablet 0     Patient Stressors:    Patient Strengths:    Treatment Modalities: Medication Management, Group therapy, Case management,  1 to 1 session with clinician, Psychoeducation, Recreational therapy.   Physician Treatment Plan for Primary Diagnosis: MDD (major depressive disorder), recurrent episode, moderate (HCC) Long Term Goal(s): Improvement in symptoms so as ready for discharge   Short Term Goals: Ability to identify changes in lifestyle to reduce recurrence of condition will improve Ability to verbalize feelings will improve Ability to disclose and discuss suicidal ideas Ability to demonstrate self-control will improve Ability to identify and develop effective coping behaviors will improve Ability to maintain clinical measurements within normal limits will improve  Medication Management: Evaluate patient's response, side effects, and tolerance of medication regimen.  Therapeutic Interventions: 1 to 1 sessions, Unit Group sessions and Medication administration.  Evaluation of Outcomes: Not Met  Physician Treatment Plan for Secondary Diagnosis: Principal Problem:   MDD (major depressive disorder), recurrent episode, moderate (HCC)  Long Term Goal(s): Improvement in symptoms so as ready for discharge   Short Term Goals: Ability to identify changes in lifestyle to reduce recurrence of condition will improve Ability to verbalize feelings will improve Ability to disclose and discuss suicidal ideas Ability to demonstrate self-control will improve Ability to identify and develop effective coping behaviors will improve Ability to maintain clinical measurements within normal limits will improve     Medication Management: Evaluate patient's response, side effects, and tolerance of medication  regimen.  Therapeutic Interventions: 1 to 1 sessions, Unit Group sessions and Medication administration.  Evaluation of Outcomes: Not Met   RN Treatment Plan for Primary Diagnosis: MDD (major depressive disorder), recurrent episode, moderate (HCC) Long Term Goal(s): Knowledge of disease and therapeutic regimen to maintain health will improve  Short Term Goals: Ability to demonstrate self-control, Ability to participate in decision making will improve, Ability to verbalize feelings will improve, Ability to disclose and discuss suicidal ideas, Ability to identify and develop effective coping behaviors will improve, and Compliance with prescribed medications will improve  Medication Management: RN will administer medications as ordered by provider, will assess and evaluate patient's response and provide education to patient for prescribed medication. RN will report any adverse and/or side effects to prescribing provider.  Therapeutic Interventions: 1 on 1 counseling sessions, Psychoeducation, Medication administration, Evaluate responses to  treatment, Monitor vital signs and CBGs as ordered, Perform/monitor CIWA, COWS, AIMS and Fall Risk screenings as ordered, Perform wound care treatments as ordered.  Evaluation of Outcomes: Not Met   LCSW Treatment Plan for Primary Diagnosis: MDD (major depressive disorder), recurrent episode, moderate (HCC) Long Term Goal(s): Safe transition to appropriate next level of care at discharge, Engage patient in therapeutic group addressing interpersonal concerns.  Short Term Goals: Engage patient in aftercare planning with referrals and resources, Increase social support, Increase ability to appropriately verbalize feelings, Increase emotional regulation, Facilitate acceptance of mental health diagnosis and concerns, and Increase skills for wellness and recovery  Therapeutic Interventions: Assess for all discharge needs, 1 to 1 time with Social worker, Explore  available resources and support systems, Assess for adequacy in community support network, Educate family and significant other(s) on suicide prevention, Complete Psychosocial Assessment, Interpersonal group therapy.  Evaluation of Outcomes: Not Met   Progress in Treatment: Attending groups: Yes. Participating in groups: Yes. Taking medication as prescribed: Yes. Toleration medication: Yes. Family/Significant other contact made: No, will contact:  once permission has been granted Patient understands diagnosis: Yes. Discussing patient identified problems/goals with staff: Yes. Medical problems stabilized or resolved: Yes. Denies suicidal/homicidal ideation: Yes. Issues/concerns per patient self-inventory: No. Other: none  New problem(s) identified: No, Describe:  none  New Short Term/Long Term Goal(s): detox, elimination of symptoms of psychosis, medication management for mood stabilization; elimination of SI thoughts; development of comprehensive mental wellness/sobriety plan.   Patient Goals:  to go home  Discharge Plan or Barriers: CSW to assist the patient in development of appropriate discharge plans.   Reason for Continuation of Hospitalization: Anxiety Depression Medication stabilization Suicidal ideation  Estimated Length of Stay: 1-7 days  Last 3 [place] Suicide Severity Risk Score: Flowsheet Row ED from 05/04/2024 in [facility]  C-SSRS RISK CATEGORY Low Risk    Last PHQ 2/9 Scores:    03/13/2024    2:33 PM 09/20/2023   10:19 AM 09/08/2023    1:43 PM  Depression screen PHQ 2/9  Decreased Interest 0 1 2  Down, Depressed, Hopeless 0 0 1  PHQ - 2 Score 0 1 3  Altered sleeping 3 1 1   Tired, decreased energy 0 1 2  Change in appetite 0 1 0  Feeling bad or failure about yourself  0 0 1  Trouble concentrating 0 1 2  Moving slowly or fidgety/restless 0 0 0  Suicidal thoughts 0 0 0  PHQ-9 Score 3 5 9   Difficult doing  work/chores Not difficult at all  Somewhat difficult    Scribe for Treatment Team: Sherryle JINNY Margo, LCSW 05/08/2024 11:46 AM

## 2024-05-08 NOTE — Group Note (Signed)
 Physical/Occupational Therapy Group Note  Group Topic: Yoga  Group Date: 05/08/2024 Start Time: 1305 End Time: 1335 Facilitators: Cadie Sorci, Alm Hamilton, PT   Group Description: Group participated with series of yoga poses, designed to emphasize functional sitting balance, core stability, generalized flexibility and overall posture.  Incorporated deep breathing techniques with poses, working to promote relaxation, mindfulness and focus with targeted activities.   Discussed benefits of yoga in improving mood and self-esteem, reducing stress and anxiety, and promoting functional strength and balance for each participant.  Discussed ways to integrate into each participant's daily routine.  Provided handout with written and pictorial descriptions of included yoga movements to be utilized as appropriate outside of group time.  Therapeutic Goal(s):  Demonstrate safe ability to participate with yoga poses during group activity. Identify one benefit of participation with yoga poses as part of each participant's exercise/movement routine. Identify 1-2 individual poses that participant feels most beneficial to his/her needs and that he/she can easily replicate outside of group.  Individual Participation: Did not attend  Participation Level:   Participation Quality:   Behavior:   Speech/Thought Process:   Affect/Mood:   Insight:   Judgement:   Modes of Intervention:   Patient Response to Interventions:    Plan: Continue to engage patient in PT/OT groups 1 - 2x/week.  CHARM Hamilton Bertin PT, DPT 05/08/24, 1:42 PM

## 2024-05-08 NOTE — Plan of Care (Signed)

## 2024-05-08 NOTE — Progress Notes (Signed)
 [facility] MD Progress Note  05/08/2024 1:33 PM Tammy Acosta  MRN:  [id number] [name] is a 69 year old female who presents to the ER due to anxiety and depression. Patient states she is having thoughts of ending her life. She hasn't had her medication in over a month. She recently got them filled but have been unable to get them from the pharmacy Patient is admitted to [facility] unit with Q15 min safety monitoring. Multidisciplinary team approach is offered. Medication management; group/milieu therapy is offered.   Subjective:  Chart reviewed, case discussed in multidisciplinary meeting, patient seen during rounds.  Patient met with the treatment team.She remains discharge focused and lacks insight into her memory problems. Patient was informed of her families concern about patient needing help at home with medications. She consistently denies SI/HI/plan. She denies auditory/visual hallucinations.   Sleep: Fair  Appetite:  Fair  Past Psychiatric History: see h&P Family History:  Family History  Problem Relation Age of Onset   Heart disease Mother    Hypertension Mother    Cancer Mother        breast   Breast cancer Mother        early 31's   Heart disease Father    Arthritis Other        Parent, other relative   Colon cancer Other        Parent   Lung cancer Other        Other relative   Diabetes Other        Other relative   Uterine cancer Other        Other relative   Cancer Maternal Aunt        breast   Breast cancer Maternal Aunt    Cancer Maternal Aunt        ovarian   Breast cancer Cousin    Breast cancer Cousin    Breast cancer Cousin    Social History:  Social History   Substance and Sexual Activity  Alcohol  Use No     Social History   Substance and Sexual Activity  Drug Use No    Social History   Socioeconomic History   Marital status: Divorced    Spouse name: Not on file   Number of children: Not on file   Years of education: Not on file    Highest education level: Associate degree: occupational, Scientist, product/process development, or vocational program  Occupational History   Not on file  Tobacco Use   Smoking status: Every Day    Current packs/day: 0.00    Types: Cigarettes   Smokeless tobacco: Never  Substance and Sexual Activity   Alcohol  use: No   Drug use: No   Sexual activity: Never  Other Topics Concern   Not on file  Social History Narrative   Not on file   Social Drivers of Health   Financial Resource Strain: Low Risk  (03/13/2024)   Overall Financial Resource Strain (CARDIA)    Difficulty of Paying Living Expenses: Not hard at all  Food Insecurity: No Food Insecurity (05/06/2024)   Hunger Vital Sign    Worried About Running Out of Food in the Last Year: Never true    Ran Out of Food in the Last Year: Never true  Transportation Needs: No Transportation Needs (05/06/2024)   PRAPARE - Administrator, Civil Service (Medical): No    Lack of Transportation (Non-Medical): No  Physical Activity: Insufficiently Active (03/13/2024)   Exercise Vital Sign  Days of Exercise per Week: 3 days    Minutes of Exercise per Session: 20 min  Stress: No Stress Concern Present (03/13/2024)   [name] of Occupational Health - Occupational Stress Questionnaire    Feeling of Stress: Not at all  Social Connections: Socially Isolated (05/06/2024)   Social Connection and Isolation Panel    Frequency of Communication with Friends and Family: More than three times a week    Frequency of Social Gatherings with Friends and Family: More than three times a week    Attends Religious Services: Never    Database administrator or Organizations: No    Attends Banker Meetings: Never    Marital Status: Never married   Past Medical History:  Past Medical History:  Diagnosis Date   Altered mental status 02/04/2014   Arthritis    Colon polyps    Depression    Diabetes mellitus without complication (HCC)    Pt states she takes  Insulin  and metformin .   Hypercholesteremia    Hyperlipidemia    Hypertension    Shingles    10/17   Spine disorder    Trigeminal herpes zoster (left V1 distribution) 06/24/2016   Viral upper respiratory illness 12/03/2015    Past Surgical History:  Procedure Laterality Date   ABDOMINAL HYSTERECTOMY     BACK SURGERY     CHOLECYSTECTOMY     KYPHOPLASTY      Current Medications: Current Facility-Administered Medications  Medication Dose Route Frequency Provider Last Rate Last Admin   acetaminophen  (TYLENOL ) tablet 650 mg  650 mg Oral Q6H PRN Bobbitt, Shalon E, NP   650 mg at 05/08/24 0908   alum & mag hydroxide-simeth (MAALOX/MYLANTA) 200-200-20 MG/5ML suspension 30 mL  30 mL Oral Q4H PRN Bobbitt, Shalon E, NP       artificial tears ophthalmic solution 1 drop  1 drop Both Eyes PRN Lavonya Hoerner, MD   1 drop at 05/07/24 1937   cyanocobalamin  (VITAMIN B12) tablet 1,000 mcg  1,000 mcg Oral Daily Stephone Gum, MD   1,000 mcg at 05/08/24 0908   hydrOXYzine  (ATARAX ) tablet 10 mg  10 mg Oral TID Lander Eslick, MD   10 mg at 05/08/24 0908   hydrOXYzine  (ATARAX ) tablet 25 mg  25 mg Oral Q6H PRN Tyqwan Pink, MD       insulin  aspart (novoLOG ) injection 0-5 Units  0-5 Units Subcutaneous QHS Bobbitt, Shalon E, NP   3 Units at 05/07/24 2114   insulin  aspart (novoLOG ) injection 0-9 Units  0-9 Units Subcutaneous TID WC Bobbitt, Shalon E, NP   3 Units at 05/08/24 1213   insulin  aspart (novoLOG ) injection 3 Units  3 Units Subcutaneous TID WC Bobbitt, Shalon E, NP   3 Units at 05/08/24 1213   insulin  glargine (LANTUS ) injection 40 Units  40 Units Subcutaneous Daily Kymiah Araiza, MD   40 Units at 05/08/24 0908   living well with diabetes book MISC   Does not apply Once Ivo Moga, MD       LORazepam  (ATIVAN ) tablet 1 mg  1 mg Oral Q6H PRN Alyse Kathan, MD   1 mg at 05/06/24 1449   magnesium  hydroxide (MILK OF MAGNESIA) suspension 30 mL  30 mL Oral Daily PRN Bobbitt, Shalon E, NP        mirtazapine  (REMERON  SOL-TAB) disintegrating tablet 15 mg  15 mg Oral QHS Deann Mclaine, MD   15 mg at 05/07/24 2115   nicotine  (NICODERM CQ  - dosed in mg/24  hours) patch 21 mg  21 mg Transdermal Daily Ranjit Ashurst, MD   21 mg at 05/08/24 9082   OLANZapine  (ZYPREXA ) injection 5 mg  5 mg Intramuscular Q8H PRN Isac Lincks, MD       OLANZapine  (ZYPREXA ) tablet 5 mg  5 mg Oral Q8H PRN Castle Lamons, MD       sertraline  (ZOLOFT ) tablet 100 mg  100 mg Oral Daily Jissell Trafton, MD   100 mg at 05/08/24 0908    Lab Results:  Results for orders placed or performed during the hospital encounter of 05/06/24 (from the past 48 hours)  Glucose, capillary     Status: Abnormal   Collection Time: 05/06/24  4:27 PM  Result Value Ref Range   Glucose-Capillary 306 (H) 70 - 99 mg/dL    Comment: Glucose reference range applies only to samples taken after fasting for at least 8 hours.  Glucose, capillary     Status: Abnormal   Collection Time: 05/06/24  8:18 PM  Result Value Ref Range   Glucose-Capillary 321 (H) 70 - 99 mg/dL    Comment: Glucose reference range applies only to samples taken after fasting for at least 8 hours.  Glucose, capillary     Status: None   Collection Time: 05/07/24  7:19 AM  Result Value Ref Range   Glucose-Capillary 89 70 - 99 mg/dL    Comment: Glucose reference range applies only to samples taken after fasting for at least 8 hours.  Glucose, capillary     Status: Abnormal   Collection Time: 05/07/24  9:31 AM  Result Value Ref Range   Glucose-Capillary 225 (H) 70 - 99 mg/dL    Comment: Glucose reference range applies only to samples taken after fasting for at least 8 hours.  Glucose, capillary     Status: Abnormal   Collection Time: 05/07/24 11:26 AM  Result Value Ref Range   Glucose-Capillary 238 (H) 70 - 99 mg/dL    Comment: Glucose reference range applies only to samples taken after fasting for at least 8 hours.  Glucose, capillary     Status: Abnormal    Collection Time: 05/07/24  4:17 PM  Result Value Ref Range   Glucose-Capillary 169 (H) 70 - 99 mg/dL    Comment: Glucose reference range applies only to samples taken after fasting for at least 8 hours.  Glucose, capillary     Status: Abnormal   Collection Time: 05/07/24  7:42 PM  Result Value Ref Range   Glucose-Capillary 277 (H) 70 - 99 mg/dL    Comment: Glucose reference range applies only to samples taken after fasting for at least 8 hours.  Glucose, capillary     Status: Abnormal   Collection Time: 05/08/24  7:23 AM  Result Value Ref Range   Glucose-Capillary 117 (H) 70 - 99 mg/dL    Comment: Glucose reference range applies only to samples taken after fasting for at least 8 hours.  Glucose, capillary     Status: Abnormal   Collection Time: 05/08/24 11:23 AM  Result Value Ref Range   Glucose-Capillary 235 (H) 70 - 99 mg/dL    Comment: Glucose reference range applies only to samples taken after fasting for at least 8 hours.    Blood Alcohol  level:  Lab Results  Component Value Date   [facility] <15 05/04/2024    Metabolic Disorder Labs: Lab Results  Component Value Date   HGBA1C >15.5 (H) 04/03/2024   MPG >398 04/03/2024   No results found for: PROLACTIN  Lab Results  Component Value Date   CHOL 71 05/07/2023   TRIG 307 (H) 05/07/2023   HDL 23 (L) 05/07/2023   CHOLHDL 3.1 05/07/2023   VLDL 36.1 (H) 11/13/2020   LDLCALC 16 05/07/2023   LDLCALC 37 01/29/2020    Physical Findings: AIMS:  , ,  ,  ,    CIWA:    COWS:      Psychiatric Specialty Exam:  Presentation  General Appearance:  Appropriate for Environment; Casual  Eye Contact: Fair  Speech: Clear and Coherent  Speech Volume: Decreased    Mood and Affect  Mood: Depressed; Dysphoric  Affect: Depressed; Flat   Thought Process  Thought Processes: Coherent  Descriptions of Associations:Intact  Orientation:Full (Time, Place and Person)  Thought  Content:Logical  Hallucinations:denies  Ideas of Reference:None  Suicidal Thoughts:denies  Homicidal Thoughts:denies   Sensorium  Memory: Immediate Fair; Recent Fair; Remote Fair  Judgment: Fair  Insight: Fair   Art therapist  Concentration: Fair  Attention Span: Fair  Recall: [employer] of Knowledge: Fair  Language: Fair   Psychomotor Activity  Psychomotor Activity: No data recorded  Musculoskeletal: Strength & Muscle Tone: within normal limits Gait & Station: unsteady Assets  Assets: Desire for Improvement    Physical Exam: Physical Exam Vitals and nursing note reviewed.    ROS Blood pressure (!) 128/93, pulse 88, temperature (!) 97.5 F (36.4 C), resp. rate 20, height 5' 2 (1.575 m), weight 52.6 kg, SpO2 99%. Body mass index is 21.22 kg/m.  Diagnosis: Principal Problem:   MDD (major depressive disorder), recurrent episode, moderate (HCC)  Clinical Decision Making: Patient currently admitted for depression and suicidal ideation.  Family reports underlying dementia and noncompliance with medication.  Patient will be monitored closely for stabilization  Treatment Plan Summary:  Safety and Monitoring:             -- Voluntary admission to inpatient psychiatric unit for safety, stabilization and treatment             -- Daily contact with patient to assess and evaluate symptoms and progress in treatment             -- Patient's case to be discussed in multi-disciplinary team meeting             -- Observation Level: q15 minute checks             -- Vital signs:  q12 hours             -- Precautions: suicide, elopement, and assault   2. Psychiatric Diagnoses and Treatment:               Zoloft  100 mg daily Hydroxyzine  as needed is added Remeron  15 mg nightly is added for insomnia   -- The risks/benefits/side-effects/alternatives to this medication were discussed in detail with the patient and time was given for questions. The  patient consents to medication trial.                -- Metabolic profile and EKG monitoring obtained while on an atypical antipsychotic (BMI: Lipid Panel: HbgA1c: QTc:)              -- Encouraged patient to participate in unit milieu and in scheduled group therapies    4. Discharge Planning:   -- Social work and case management to assist with discharge planning and identification of hospital follow-up needs prior to discharge  -- Estimated LOS: 3-4 days  Allyn Foil, MD 05/08/2024, 1:33  PM

## 2024-05-08 NOTE — Group Note (Signed)
 Date:  05/08/2024 Time:  9:20 PM  Group Topic/Focus:  Wrap-Up Group:   The focus of this group is to help patients review their daily goal of treatment and discuss progress on daily workbooks.    Participation Level:  Minimal  Participation Quality:  Inattentive  Affect:  Appropriate  Cognitive:  Oriented  Insight: Limited  Engagement in Group:  Limited  Modes of Intervention:  Discussion  Additional Comments:    [name] 05/08/2024, 9:20 PM

## 2024-05-08 NOTE — Progress Notes (Signed)
   05/08/24 1300  Psych Admission Type (Psych Patients Only)  Admission Status Voluntary  Psychosocial Assessment  Patient Complaints None  Eye Contact Brief  Facial Expression Anxious  Affect Anxious  Speech Logical/coherent  Interaction Assertive  Motor Activity Slow  Appearance/Hygiene In scrubs  Behavior Characteristics Cooperative  Mood Pleasant  Thought Process  Coherency WDL  Content WDL  Delusions None reported or observed  Perception WDL  Hallucination None reported or observed  Judgment Impaired  Confusion Mild  Danger to Self  Current suicidal ideation? Denies  Danger to Others  Danger to Others None reported or observed

## 2024-05-08 NOTE — Group Note (Signed)
 Date:  05/08/2024 Time:  1:07 PM  Group Topic/Focus:  Building Self Esteem:   The Focus of this group is helping patients become aware of the effects of self-esteem on their lives, the things they and others do that enhance or undermine their self-esteem, seeing the relationship between their level of self-esteem and the choices they make and learning ways to enhance self-esteem.    Participation Level:  Did Not Attend   Tammy Acosta Gavel 05/08/2024, 1:07 PM

## 2024-05-09 LAB — GLUCOSE, CAPILLARY
Glucose-Capillary: 152 mg/dL — ABNORMAL HIGH (ref 70–99)
Glucose-Capillary: 197 mg/dL — ABNORMAL HIGH (ref 70–99)
Glucose-Capillary: 248 mg/dL — ABNORMAL HIGH (ref 70–99)
Glucose-Capillary: 182 mg/dL — ABNORMAL HIGH (ref 70–99)

## 2024-05-09 NOTE — Group Note (Signed)
 Date:  05/09/2024 Time:  10:20 AM  Group Topic/Focus:  Self Esteem Action Plan:   The focus of this group is to help patients create a plan to continue to build self-esteem after discharge.    Participation Level:  Did Not Attend   Tammy Acosta 05/09/2024, 10:20 AM

## 2024-05-09 NOTE — Plan of Care (Signed)
  Problem: Self-Concept: Goal: Ability to identify factors that promote anxiety will improve Outcome: Progressing Goal: Level of anxiety will decrease Outcome: Progressing Goal: Ability to modify response to factors that promote anxiety will improve Outcome: Progressing   

## 2024-05-09 NOTE — Progress Notes (Signed)
 [facility] MD Progress Note  05/09/2024 9:25 PM Tammy Acosta  MRN:  [id number] Tammy Acosta is a 68 year old female who presents to the ER due to anxiety and depression. Patient states she is having thoughts of ending her life. She hasn't had her medication in over a month. She recently got them filled but have been unable to get them from the pharmacy Patient is admitted to [facility] unit with Q15 min safety monitoring. Multidisciplinary team approach is offered. Medication management; group/milieu therapy is offered.   Subjective:  Chart reviewed, case discussed in multidisciplinary meeting, patient seen during rounds.  Patient is noted to be resting in bed.  Per nursing report patient remains resting in her bed.  Patient reports that her back pain is worse because of the hospital beds and she wishes to go back home.  She understands that her children are working with the social work team for resources on discharge like ACT team or home health aide.  Patient denies SI/HI/plan and denies hallucinations.  Patient reports having fair appetite and sleep.  Sleep: Fair  Appetite:  Fair  Past Psychiatric History: see h&P Family History:  Family History  Problem Relation Age of Onset   Heart disease Mother    Hypertension Mother    Cancer Mother        breast   Breast cancer Mother        early 70's   Heart disease Father    Arthritis Other        Parent, other relative   Colon cancer Other        Parent   Lung cancer Other        Other relative   Diabetes Other        Other relative   Uterine cancer Other        Other relative   Cancer Maternal Aunt        breast   Breast cancer Maternal Aunt    Cancer Maternal Aunt        ovarian   Breast cancer Cousin    Breast cancer Cousin    Breast cancer Cousin    Social History:  Social History   Substance and Sexual Activity  Alcohol  Use No     Social History   Substance and Sexual Activity  Drug Use No    Social History    Socioeconomic History   Marital status: Divorced    Spouse name: Not on file   Number of children: Not on file   Years of education: Not on file   Highest education level: Associate degree: occupational, Scientist, product/process development, or vocational program  Occupational History   Not on file  Tobacco Use   Smoking status: Every Day    Current packs/day: 0.00    Types: Cigarettes   Smokeless tobacco: Never  Substance and Sexual Activity   Alcohol  use: No   Drug use: No   Sexual activity: Never  Other Topics Concern   Not on file  Social History Narrative   Not on file   Social Drivers of Health   Financial Resource Strain: Low Risk  (03/13/2024)   Overall Financial Resource Strain (CARDIA)    Difficulty of Paying Living Expenses: Not hard at all  Food Insecurity: No Food Insecurity (05/06/2024)   Hunger Vital Sign    Worried About Running Out of Food in the Last Year: Never true    Ran Out of Food in the Last Year: Never true  Transportation  Needs: No Transportation Needs (05/06/2024)   PRAPARE - Administrator, Civil Service (Medical): No    Lack of Transportation (Non-Medical): No  Physical Activity: Insufficiently Active (03/13/2024)   Exercise Vital Sign    Days of Exercise per Week: 3 days    Minutes of Exercise per Session: 20 min  Stress: No Stress Concern Present (03/13/2024)   [name] of Occupational Health - Occupational Stress Questionnaire    Feeling of Stress: Not at all  Social Connections: Socially Isolated (05/06/2024)   Social Connection and Isolation Panel    Frequency of Communication with Friends and Family: More than three times a week    Frequency of Social Gatherings with Friends and Family: More than three times a week    Attends Religious Services: Never    Database administrator or Organizations: No    Attends Banker Meetings: Never    Marital Status: Never married   Past Medical History:  Past Medical History:  Diagnosis Date    Altered mental status 02/04/2014   Arthritis    Colon polyps    Depression    Diabetes mellitus without complication (HCC)    Pt states she takes Insulin  and metformin .   Hypercholesteremia    Hyperlipidemia    Hypertension    Shingles    10/17   Spine disorder    Trigeminal herpes zoster (left V1 distribution) 06/24/2016   Viral upper respiratory illness 12/03/2015    Past Surgical History:  Procedure Laterality Date   ABDOMINAL HYSTERECTOMY     BACK SURGERY     CHOLECYSTECTOMY     KYPHOPLASTY      Current Medications: Current Facility-Administered Medications  Medication Dose Route Frequency Provider Last Rate Last Admin   acetaminophen  (TYLENOL ) tablet 650 mg  650 mg Oral Q6H PRN Bobbitt, Shalon E, NP   650 mg at 05/09/24 1535   alum & mag hydroxide-simeth (MAALOX/MYLANTA) 200-200-20 MG/5ML suspension 30 mL  30 mL Oral Q4H PRN Bobbitt, Shalon E, NP       artificial tears ophthalmic solution 1 drop  1 drop Both Eyes PRN Sirius Woodford, MD   1 drop at 05/09/24 9366   cyanocobalamin  (VITAMIN B12) tablet 1,000 mcg  1,000 mcg Oral Daily Rosalie Gelpi, MD   1,000 mcg at 05/09/24 9157   hydrOXYzine  (ATARAX ) tablet 10 mg  10 mg Oral TID Connie Lasater, MD   10 mg at 05/09/24 1641   hydrOXYzine  (ATARAX ) tablet 25 mg  25 mg Oral Q6H PRN Mannie Ohlin, MD   25 mg at 05/08/24 2131   insulin  aspart (novoLOG ) injection 0-5 Units  0-5 Units Subcutaneous QHS Bobbitt, Shalon E, NP   3 Units at 05/08/24 2132   insulin  aspart (novoLOG ) injection 0-9 Units  0-9 Units Subcutaneous TID WC Bobbitt, Shalon E, NP   3 Units at 05/09/24 1642   insulin  aspart (novoLOG ) injection 3 Units  3 Units Subcutaneous TID WC Bobbitt, Shalon E, NP   3 Units at 05/09/24 1642   insulin  glargine (LANTUS ) injection 40 Units  40 Units Subcutaneous Daily Krystine Pabst, MD   40 Units at 05/09/24 0843   living well with diabetes book MISC   Does not apply Once Saxon Barich, MD       LORazepam  (ATIVAN ) tablet 1  mg  1 mg Oral Q6H PRN Takita Riecke, MD   1 mg at 05/06/24 1449   magnesium  hydroxide (MILK OF MAGNESIA) suspension 30 mL  30 mL Oral  Daily PRN Bobbitt, Shalon E, NP       mirtazapine  (REMERON  SOL-TAB) disintegrating tablet 15 mg  15 mg Oral QHS Jahsir Rama, MD   15 mg at 05/08/24 2132   nicotine  (NICODERM CQ  - dosed in mg/24 hours) patch 21 mg  21 mg Transdermal Daily Ariannah Arenson, MD   21 mg at 05/09/24 0845   OLANZapine  (ZYPREXA ) injection 5 mg  5 mg Intramuscular Q8H PRN Donnelly Mellow, MD       OLANZapine  (ZYPREXA ) tablet 5 mg  5 mg Oral Q8H PRN Nelsie Domino, MD       sertraline  (ZOLOFT ) tablet 100 mg  100 mg Oral Daily Judit Awad, MD   100 mg at 05/09/24 9157    Lab Results:  Results for orders placed or performed during the hospital encounter of 05/06/24 (from the past 48 hours)  Glucose, capillary     Status: Abnormal   Collection Time: 05/08/24  7:23 AM  Result Value Ref Range   Glucose-Capillary 117 (H) 70 - 99 mg/dL    Comment: Glucose reference range applies only to samples taken after fasting for at least 8 hours.  Glucose, capillary     Status: Abnormal   Collection Time: 05/08/24 11:23 AM  Result Value Ref Range   Glucose-Capillary 235 (H) 70 - 99 mg/dL    Comment: Glucose reference range applies only to samples taken after fasting for at least 8 hours.  Glucose, capillary     Status: Abnormal   Collection Time: 05/08/24  4:32 PM  Result Value Ref Range   Glucose-Capillary 232 (H) 70 - 99 mg/dL    Comment: Glucose reference range applies only to samples taken after fasting for at least 8 hours.  Glucose, capillary     Status: Abnormal   Collection Time: 05/08/24  7:11 PM  Result Value Ref Range   Glucose-Capillary 282 (H) 70 - 99 mg/dL    Comment: Glucose reference range applies only to samples taken after fasting for at least 8 hours.  Glucose, capillary     Status: Abnormal   Collection Time: 05/09/24  7:13 AM  Result Value Ref Range    Glucose-Capillary 152 (H) 70 - 99 mg/dL    Comment: Glucose reference range applies only to samples taken after fasting for at least 8 hours.  Glucose, capillary     Status: Abnormal   Collection Time: 05/09/24 11:20 AM  Result Value Ref Range   Glucose-Capillary 197 (H) 70 - 99 mg/dL    Comment: Glucose reference range applies only to samples taken after fasting for at least 8 hours.  Glucose, capillary     Status: Abnormal   Collection Time: 05/09/24  4:19 PM  Result Value Ref Range   Glucose-Capillary 248 (H) 70 - 99 mg/dL    Comment: Glucose reference range applies only to samples taken after fasting for at least 8 hours.  Glucose, capillary     Status: Abnormal   Collection Time: 05/09/24  7:36 PM  Result Value Ref Range   Glucose-Capillary 182 (H) 70 - 99 mg/dL    Comment: Glucose reference range applies only to samples taken after fasting for at least 8 hours.    Blood Alcohol  level:  Lab Results  Component Value Date   [facility] <15 05/04/2024    Metabolic Disorder Labs: Lab Results  Component Value Date   HGBA1C >15.5 (H) 04/03/2024   MPG >398 04/03/2024   No results found for: PROLACTIN Lab Results  Component Value Date  CHOL 71 05/07/2023   TRIG 307 (H) 05/07/2023   HDL 23 (L) 05/07/2023   CHOLHDL 3.1 05/07/2023   VLDL 36.1 (H) 11/13/2020   LDLCALC 16 05/07/2023   LDLCALC 37 01/29/2020    Physical Findings: AIMS:  , ,  ,  ,    CIWA:    COWS:      Psychiatric Specialty Exam:  Presentation  General Appearance:  Appropriate for Environment; Casual  Eye Contact: Fair  Speech: Clear and Coherent  Speech Volume: Decreased    Mood and Affect  Mood: fine Affect: stable  Thought Process  Thought Processes: Coherent  Descriptions of Associations:Intact  Orientation:Full (Time, Place and Person)  Thought Content:Logical  Hallucinations:denies  Ideas of Reference:None  Suicidal Thoughts:denies  Homicidal  Thoughts:denies   Sensorium  Memory: Immediate Fair; Recent Fair; Remote Fair  Judgment: Fair  Insight: Fair   Art therapist  Concentration: Fair  Attention Span: Fair  Recall: [employer] of Knowledge: Fair  Language: Fair   Psychomotor Activity  Psychomotor Activity: No data recorded  Musculoskeletal: Strength & Muscle Tone: within normal limits Gait & Station: unsteady Assets  Assets: Desire for Improvement    Physical Exam: Physical Exam Vitals and nursing note reviewed.    ROS Blood pressure (!) 140/63, pulse 70, temperature 98 F (36.7 C), resp. rate 14, height 5' 2 (1.575 m), weight 52.6 kg, SpO2 99%. Body mass index is 21.22 kg/m.  Diagnosis: Principal Problem:   MDD (major depressive disorder), recurrent episode, moderate (HCC)  Clinical Decision Making: Patient currently admitted for depression and suicidal ideation.  Family reports underlying dementia and noncompliance with medication.  Patient will be monitored closely for stabilization  Treatment Plan Summary:  Safety and Monitoring:             -- Voluntary admission to inpatient psychiatric unit for safety, stabilization and treatment             -- Daily contact with patient to assess and evaluate symptoms and progress in treatment             -- Patient's case to be discussed in multi-disciplinary team meeting             -- Observation Level: q15 minute checks             -- Vital signs:  q12 hours             -- Precautions: suicide, elopement, and assault   2. Psychiatric Diagnoses and Treatment:               Zoloft  100 mg daily Hydroxyzine  as needed is added Remeron  15 mg nightly is added for insomnia   -- The risks/benefits/side-effects/alternatives to this medication were discussed in detail with the patient and time was given for questions. The patient consents to medication trial.                -- Metabolic profile and EKG monitoring obtained while on an  atypical antipsychotic (BMI: Lipid Panel: HbgA1c: QTc:)              -- Encouraged patient to participate in unit milieu and in scheduled group therapies    4. Discharge Planning:   -- Social work and case management to assist with discharge planning and identification of hospital follow-up needs prior to discharge  -- Estimated LOS: 3-4 days  Weylyn Ricciuti, MD 05/09/2024, 9:25 PM

## 2024-05-09 NOTE — Progress Notes (Signed)
   05/09/24 0019  Psych Admission Type (Psych Patients Only)  Admission Status Voluntary  Psychosocial Assessment  Patient Complaints Other (Comment) (back pain)  Eye Contact Brief  Facial Expression Anxious  Affect Anxious  Speech Logical/coherent  Interaction Assertive  Motor Activity Slow  Appearance/Hygiene In scrubs  Behavior Characteristics Cooperative;Appropriate to situation  Mood Pleasant  Thought Process  Coherency WDL  Content WDL  Delusions None reported or observed  Perception WDL  Hallucination None reported or observed  Judgment Impaired  Confusion Mild  Danger to Self  Current suicidal ideation? Denies  Danger to Others  Danger to Others None reported or observed   Estimated Sleeping Duration (Last 24 Hours): 10.00-12.50 hours

## 2024-05-09 NOTE — Group Note (Signed)
 Date:  05/09/2024 Time:  10:06 PM  Group Topic/Focus:  Wrap-Up Group:   The focus of this group is to help patients review their daily goal of treatment and discuss progress on daily workbooks.    Participation Level:  Did Not Attend  Participation Quality:     Affect:     Cognitive:     Insight: None  Engagement in Group:     Modes of Intervention:     Additional Comments:    [name] 05/09/2024, 10:06 PM

## 2024-05-09 NOTE — Plan of Care (Signed)
  Problem: Self-Concept: Goal: Level of anxiety will decrease Outcome: Progressing   Problem: Education: Goal: Emotional status will improve Outcome: Progressing Goal: Mental status will improve Outcome: Progressing   Problem: Activity: Goal: Sleeping patterns will improve Outcome: Progressing   Problem: Coping: Goal: Ability to demonstrate self-control will improve Outcome: Progressing

## 2024-05-09 NOTE — Progress Notes (Signed)
   05/09/24 1100  Psych Admission Type (Psych Patients Only)  Admission Status Voluntary  Psychosocial Assessment  Patient Complaints None  Eye Contact Brief  Facial Expression Sad  Affect Sad  Speech Logical/coherent  Interaction Minimal  Motor Activity Slow  Appearance/Hygiene In scrubs  Behavior Characteristics Cooperative  Mood Pleasant  Thought Process  Coherency WDL  Content WDL  Delusions None reported or observed  Perception WDL  Hallucination None reported or observed  Judgment Impaired  Confusion Mild  Danger to Self  Current suicidal ideation? Denies  Danger to Others  Danger to Others None reported or observed

## 2024-05-09 NOTE — Group Note (Signed)
 Recreation Therapy Group Note   Group Topic:General Recreation  Group Date: 05/09/2024 Start Time: 1100 End Time: 1130 Facilitators: Celestia Jeoffrey BRAVO, LRT, CTRS Location: Courtyard  Group Description: Outdoor Recreation. Patients had the option to play corn hole, ring toss, UNO, or listening to music while outside in the courtyard getting fresh air and sunlight. Patients helped water and prune the raised garden beds. LRT and patients discussed things that they enjoy doing in their free time outside of the hospital. LRT encouraged patients to drink water after being active and getting their heart rate up.   Goal Area(s) Addressed: Patient will identify leisure interests.  Patient will practice healthy decision making. Patient will engage in recreation activity.   Affect/Mood: N/A   Participation Level: Did not attend    Clinical Observations/Individualized Feedback: Patient did not attend group.   Plan: Continue to engage patient in RT group sessions 2-3x/week.   Jeoffrey BRAVO Celestia, LRT, CTRS 05/09/2024 1:37 PM

## 2024-05-09 NOTE — Group Note (Signed)
 Recreation Therapy Group Note   Group Topic:Leisure Education  Group Date: 05/09/2024 Start Time: 1500 End Time: 1600 Facilitators: Celestia Jeoffrey BRAVO, LRT, CTRS Location: Dayroom  Group Description: Bingo. Patients played multiple rounds of bingo. LRT and pts discussed the definition of leisure, things they do in their free time outside of the hospital, and how bingo is also a leisure activity. Pts received soda as their bingo prize.   Goal Area(s) Addressed:  Patient will identify a current leisure interest.  Patient will learn the definition of "leisure". Patient will have the opportunity to try a new leisure activity. Patient will communicate with peers and LRT.   Affect/Mood: N/A   Participation Level: Did not attend    Clinical Observations/Individualized Feedback: Patient did not attend group.   Plan: Continue to engage patient in RT group sessions 2-3x/week.   Jeoffrey BRAVO Celestia, LRT, CTRS 05/09/2024 5:10 PM

## 2024-05-10 LAB — GLUCOSE, CAPILLARY
Glucose-Capillary: 106 mg/dL — ABNORMAL HIGH (ref 70–99)
Glucose-Capillary: 177 mg/dL — ABNORMAL HIGH (ref 70–99)

## 2024-05-10 MED ORDER — INSULIN GLARGINE 100 UNIT/ML ~~LOC~~ SOLN: 40.0000 [IU] | Freq: Every day | SUBCUTANEOUS | 0 refills | Status: AC

## 2024-05-10 MED ORDER — INSULIN ASPART 100 UNIT/ML IJ SOLN: 0.0000 [IU] | Freq: Every day | INTRAMUSCULAR | 0 refills | Status: AC

## 2024-05-10 MED ORDER — INSULIN ASPART 100 UNIT/ML IJ SOLN: 0.0000 [IU] | Freq: Three times a day (TID) | INTRAMUSCULAR | 0 refills | Status: AC

## 2024-05-10 MED ORDER — SERTRALINE HCL 100 MG PO TABS: 100.0000 mg | ORAL_TABLET | Freq: Every day | ORAL | 0 refills | Status: DC

## 2024-05-10 MED ORDER — INSULIN ASPART 100 UNIT/ML IJ SOLN: 3.0000 [IU] | Freq: Three times a day (TID) | INTRAMUSCULAR | 0 refills | Status: AC

## 2024-05-10 MED ORDER — HYDROXYZINE HCL 10 MG PO TABS: 10.0000 mg | ORAL_TABLET | Freq: Three times a day (TID) | ORAL | 0 refills | Status: AC

## 2024-05-10 MED ORDER — MIRTAZAPINE 15 MG PO TBDP: 15.0000 mg | ORAL_TABLET | Freq: Every day | ORAL | 0 refills | Status: AC

## 2024-05-10 MED ORDER — POLYVINYL ALCOHOL 1.4 % OP SOLN: 1.0000 [drp] | OPHTHALMIC | 0 refills | Status: AC | PRN

## 2024-05-10 NOTE — Progress Notes (Signed)
   05/09/24 2230  Psych Admission Type (Psych Patients Only)  Admission Status Voluntary  Psychosocial Assessment  Patient Complaints None  Eye Contact Brief  Facial Expression Sad  Affect Sad  Speech Logical/coherent  Interaction Minimal  Motor Activity Slow  Appearance/Hygiene In scrubs  Behavior Characteristics Cooperative  Mood Pleasant  Thought Process  Coherency WDL  Content WDL  Delusions None reported or observed  Perception WDL  Hallucination None reported or observed  Judgment Impaired  Confusion Mild  Danger to Self  Current suicidal ideation? Denies  Danger to Others  Danger to Others None reported or observed

## 2024-05-10 NOTE — [facility] Suicide Risk Assessment (Signed)
 [facility] Discharge Suicide Risk Assessment   Principal Problem: MDD (major depressive disorder), recurrent episode, moderate (HCC) Discharge Diagnoses: Principal Problem:   MDD (major depressive disorder), recurrent episode, moderate (HCC)   Total Time spent with patient: 30 minutes  Musculoskeletal: Strength & Muscle Tone: within normal limits Gait & Station: normal Patient leans: N/A  Psychiatric Specialty Exam  Presentation  General Appearance:  Appropriate for Environment; Casual  Eye Contact: Fair  Speech: Clear and Coherent  Speech Volume: Normal  Handedness: Right   Mood and Affect  Mood: Euthymic  Duration of Depression Symptoms: Greater than two weeks  Affect: Appropriate   Thought Process  Thought Processes: Coherent  Descriptions of Associations:Intact  Orientation:Full (Time, Place and Person)  Thought Content:Logical  History of Schizophrenia/Schizoaffective disorder:No  Duration of Psychotic Symptoms:No data recorded Hallucinations:Hallucinations: None  Ideas of Reference:None  Suicidal Thoughts:Suicidal Thoughts: No  Homicidal Thoughts:Homicidal Thoughts: No   Sensorium  Memory: Immediate Fair; Recent Fair  Judgment: Fair  Insight: Fair   Art therapist  Concentration: Fair  Attention Span: Fair  Recall: [employer] of Knowledge: Fair  Language: Fair   Psychomotor Activity  Psychomotor Activity: Psychomotor Activity: Normal   Assets  Assets: Communication Skills; Financial Resources/Insurance   Sleep  Sleep: Sleep: Fair  Estimated Sleeping Duration (Last 24 Hours): 8.25-10.00 hours  Physical Exam: Physical Exam ROS Blood pressure (!) 142/70, pulse 72, temperature (!) 97.3 F (36.3 C), resp. rate 14, height 5' 2 (1.575 m), weight 52.6 kg, SpO2 99%. Body mass index is 21.22 kg/m.  Mental Status Per Nursing Assessment::   On Admission:     Demographic Factors:  Caucasian  Loss  Factors: Decrease in vocational status  Historical Factors: Impulsivity  Risk Reduction Factors:   Living with another person, especially a relative, Positive social support, Positive therapeutic relationship, and Positive coping skills or problem solving skills  Continued Clinical Symptoms:  Severe Anxiety and/or Agitation  Cognitive Features That Contribute To Risk:  None    Suicide Risk:  Minimal: No identifiable suicidal ideation.  Patients presenting with no risk factors but with morbid ruminations; may be classified as minimal risk based on the severity of the depressive symptoms    Plan Of Care/Follow-up recommendations:  Activity:  As tolerated  Allyn Foil, MD 05/10/2024, 9:16 AM

## 2024-05-10 NOTE — Progress Notes (Signed)
   05/10/24 1100  Psychosocial Assessment  Eye Contact Brief  Facial Expression Sad  Affect Sad  Speech Logical/coherent  Interaction Minimal  Motor Activity Slow  Appearance/Hygiene In scrubs  Thought Process  Coherency WDL  Content WDL  Delusions None reported or observed  Perception WDL  Hallucination None reported or observed  Judgment Impaired  Confusion Mild  Danger to Self  Current suicidal ideation? Denies  Danger to Others  Danger to Others None reported or observed   Patient discharged at this time in care of family. All discharge instructions read and given to patient with acknowledgements. Personal eyedrop returned to patient upon discharge.

## 2024-05-10 NOTE — Progress Notes (Signed)
  [facility] Adult Case Management Discharge Plan :  Will you be returning to the same living situation after discharge:  Yes,  pt reports that she is returning home.  At discharge, do you have transportation home?: Yes,  pt reports that family will provide transportation.  Do you have the ability to pay for your medications: Yes,   [employer] MEDICARE / [facility] MEDICARE  Release of information consent forms completed and in the chart;  Patient's signature needed at discharge.  Patient to Follow up at:  Follow-up Information     [facility] Follow up.   Why: Appointment is scheduled 05/17/2024 at 8:30 via the phone and will receive a call at [phone]. Contact information: [address]  [address] [place] [address] [phone]                 Next level of care provider has access to [facility] Link:no  Safety Planning and Suicide Prevention discussed: Yes,  SPE completed with the patient and patient's collateral.       Has patient been referred to the Quitline?: Yes, faxed/e-referral on 05/10/2024  Patient has been referred for addiction treatment: No known substance use disorder.  Sherryle JINNY Margo, LCSW 05/10/2024, 10:32 AM

## 2024-05-10 NOTE — [facility] Suicide Risk Assessment (Signed)
 [facility] INPATIENT:  Family/Significant Other Suicide Prevention Education  Suicide Prevention Education:  Education Completed;  [name] Burke, daughter, [phone],  (name of family member/significant other) has been identified by the patient as the family member/significant other with whom the patient will be residing, and identified as the person(s) who will aid the patient in the event of a mental health crisis (suicidal ideations/suicide attempt).  With written consent from the patient, the family member/significant other has been provided the following suicide prevention education, prior to the and/or following the discharge of the patient.  The suicide prevention education provided includes the following: Suicide risk factors Suicide prevention and interventions National Suicide Hotline telephone number [name] assessment telephone number [facility] Emergency Assistance 911 [facility] and/or Residential Mobile Crisis Unit telephone number  Request made of family/significant other to: Remove weapons (e.g., guns, rifles, knives), all items previously/currently identified as safety concern.   Remove drugs/medications (over-the-counter, prescriptions, illicit drugs), all items previously/currently identified as a safety concern.  The family member/significant other verbalizes understanding of the suicide prevention education information provided.  The family member/significant other agrees to remove the items of safety concern listed above.  Sherryle JINNY Margo 05/10/2024, 10:08 AM

## 2024-05-10 NOTE — Plan of Care (Signed)
?  Problem: Education: ?Goal: Ability to state activities that reduce stress will improve ?Outcome: Progressing ?  ?Problem: Self-Concept: ?Goal: Level of anxiety will decrease ?Outcome: Progressing ?  ?

## 2024-05-10 NOTE — Discharge Summary (Signed)
 Physician Discharge Summary Note  Patient:  Tammy Acosta is an 68 y.o., female MRN:  [id number] DOB:  15-Oct-1955 Patient phone:  [phone] (home)  Patient address:   [address] [place] [address],   Total time spent: 40 min Date of Admission:  05/06/2024 Date of Discharge: 05/10/24  Reason for Admission:  Tammy Acosta is a 68 year old female who presents to the ER due to anxiety and depression. Patient states she is having thoughts of ending her life. She hasn't had her medication in over a month. She recently got them filled but have been unable to get them from the pharmacy Patient is admitted to [facility] unit with Q15 min safety monitoring. Multidisciplinary team approach is offered. Medication management; group/milieu therapy is offered.   Principal Problem: MDD (major depressive disorder), recurrent episode, moderate (HCC) Discharge Diagnoses: Principal Problem:   MDD (major depressive disorder), recurrent episode, moderate (HCC)   Past Psychiatric History: see h&p  Family Psychiatric  History: see h&p Social History:  Social History   Substance and Sexual Activity  Alcohol  Use No     Social History   Substance and Sexual Activity  Drug Use No    Social History   Socioeconomic History   Marital status: Divorced    Spouse name: Not on file   Number of children: Not on file   Years of education: Not on file   Highest education level: Associate degree: occupational, Scientist, product/process development, or vocational program  Occupational History   Not on file  Tobacco Use   Smoking status: Every Day    Current packs/day: 0.00    Types: Cigarettes   Smokeless tobacco: Never  Substance and Sexual Activity   Alcohol  use: No   Drug use: No   Sexual activity: Never  Other Topics Concern   Not on file  Social History Narrative   Not on file   Social Drivers of Health   Financial Resource Strain: Low Risk  (03/13/2024)   Overall Financial Resource Strain  (CARDIA)    Difficulty of Paying Living Expenses: Not hard at all  Food Insecurity: No Food Insecurity (05/06/2024)   Hunger Vital Sign    Worried About Running Out of Food in the Last Year: Never true    Ran Out of Food in the Last Year: Never true  Transportation Needs: No Transportation Needs (05/06/2024)   PRAPARE - Administrator, Civil Service (Medical): No    Lack of Transportation (Non-Medical): No  Physical Activity: Insufficiently Active (03/13/2024)   Exercise Vital Sign    Days of Exercise per Week: 3 days    Minutes of Exercise per Session: 20 min  Stress: No Stress Concern Present (03/13/2024)   [name] of Occupational Health - Occupational Stress Questionnaire    Feeling of Stress: Not at all  Social Connections: Socially Isolated (05/06/2024)   Social Connection and Isolation Panel    Frequency of Communication with Friends and Family: More than three times a week    Frequency of Social Gatherings with Friends and Family: More than three times a week    Attends Religious Services: Never    Database administrator or Organizations: No    Attends Banker Meetings: Never    Marital Status: Never married   Past Medical History:  Past Medical History:  Diagnosis Date   Altered mental status 02/04/2014   Arthritis    Colon polyps    Depression    Diabetes  mellitus without complication (HCC)    Pt states she takes Insulin  and metformin .   Hypercholesteremia    Hyperlipidemia    Hypertension    Shingles    10/17   Spine disorder    Trigeminal herpes zoster (left V1 distribution) 06/24/2016   Viral upper respiratory illness 12/03/2015    Past Surgical History:  Procedure Laterality Date   ABDOMINAL HYSTERECTOMY     BACK SURGERY     CHOLECYSTECTOMY     KYPHOPLASTY     Family History:  Family History  Problem Relation Age of Onset   Heart disease Mother    Hypertension Mother    Cancer Mother        breast   Breast cancer Mother         early 69's   Heart disease Father    Arthritis Other        Parent, other relative   Colon cancer Other        Parent   Lung cancer Other        Other relative   Diabetes Other        Other relative   Uterine cancer Other        Other relative   Cancer Maternal Aunt        breast   Breast cancer Maternal Aunt    Cancer Maternal Aunt        ovarian   Breast cancer Cousin    Breast cancer Cousin    Breast cancer Cousin     Hospital Course:  Tammy Acosta is a 68 year old female who presents to the ER due to anxiety and depression. Patient states she is having thoughts of ending her life. She hasn't had her medication in over a month. She recently got them filled but have been unable to get them from the pharmacy Patient is admitted to [facility] unit with Q15 min safety monitoring. Multidisciplinary team approach is offered. Medication management; group/milieu therapy is offered.  Detailed risk assessment is complete based on clinical exam and individual risk factors and acute suicide risk is low and acute violence risk is low.    On admission, patient was maintained on Zoloft  100 mg daily for depression and Remeron  15 mg nightly to help with the sleep and appetite.  Patient tolerated the medications very well.  Patient displayed improvement in her mood and maintain safe behaviors on the unit.  On the day of discharge patient consistently denied SI/HI/plan and denied hallucinations.  Patient remained future oriented and was willing to participate in outpatient mental health services. Currently, all modifiable risk of harm to self/harm to others have been addressed and patient is no longer appropriate for the acute inpatient setting and is able to continue treatment for mental health needs in the community with the supports as indicated below.  Patient is educated and verbalized understanding of discharge plan of care including medications, follow-up appointments, mental health  resources and further crisis services in the community.  He is instructed to call 911 or present to the nearest emergency room should he experience any decompensation in mood, disturbance of bowel or return of suicidal/homicidal ideations.  Patient verbalizes understanding of this education and agrees to this plan of care  Physical Findings: AIMS:  , ,  ,  ,    CIWA:    COWS:        Psychiatric Specialty Exam:  Presentation  General Appearance:  Appropriate for Environment; Casual  Eye Contact: Fair  Speech: Clear and Coherent  Speech Volume: Normal    Mood and Affect  Mood: Euthymic  Affect: Appropriate   Thought Process  Thought Processes: Coherent  Descriptions of Associations:Intact  Orientation:Full (Time, Place and Person)  Thought Content:Logical  Hallucinations:Hallucinations: None  Ideas of Reference:None  Suicidal Thoughts:Suicidal Thoughts: No  Homicidal Thoughts:Homicidal Thoughts: No   Sensorium  Memory: Immediate Fair; Recent Fair  Judgment: Fair  Insight: Fair   Art therapist  Concentration: Fair  Attention Span: Fair  Recall: [employer] of Knowledge: Fair  Language: Fair   Psychomotor Activity  Psychomotor Activity: Psychomotor Activity: Normal  Musculoskeletal: Strength & Muscle Tone: within normal limits Gait & Station: unsteady Assets  Assets: Manufacturing systems engineer; Financial Resources/Insurance   Sleep  Sleep: Sleep: Fair    Physical Exam: Physical Exam Vitals and nursing note reviewed.    ROS Blood pressure (!) 142/70, pulse 72, temperature (!) 97.3 F (36.3 C), resp. rate 14, height 5' 2 (1.575 m), weight 52.6 kg, SpO2 99%. Body mass index is 21.22 kg/m.   Social History   Tobacco Use  Smoking Status Every Day   Current packs/day: 0.00   Types: Cigarettes  Smokeless Tobacco Never   Tobacco Cessation:  N/A, patient does not currently use tobacco products   Blood Alcohol   level:  Lab Results  Component Value Date   [facility] <15 05/04/2024    Metabolic Disorder Labs:  Lab Results  Component Value Date   HGBA1C >15.5 (H) 04/03/2024   MPG >398 04/03/2024   No results found for: PROLACTIN Lab Results  Component Value Date   CHOL 71 05/07/2023   TRIG 307 (H) 05/07/2023   HDL 23 (L) 05/07/2023   CHOLHDL 3.1 05/07/2023   VLDL 36.1 (H) 11/13/2020   LDLCALC 16 05/07/2023   LDLCALC 37 01/29/2020    See Psychiatric Specialty Exam and Suicide Risk Assessment completed by Attending Physician prior to discharge.  Discharge destination:  Home  Is patient on multiple antipsychotic therapies at discharge:  No   Has Patient had three or more failed trials of antipsychotic monotherapy by history:  No  Recommended Plan for Multiple Antipsychotic Therapies: NA   Allergies as of 05/10/2024       Reactions   Penicillins Anaphylaxis, Hives   Meloxicam     Tetracyclines & Related Rash        Medication List     STOP taking these medications    gabapentin  800 MG tablet Commonly known as: NEURONTIN    Lantus  SoloStar 100 UNIT/ML Solostar Pen Generic drug: insulin  glargine Replaced by: insulin  glargine 100 UNIT/ML injection       TAKE these medications      Indication  artificial tears ophthalmic solution Place 1 drop into both eyes as needed for dry eyes.    blood glucose meter kit and supplies Dispense One touch meter.  E11.9    cyanocobalamin  1000 MCG tablet Take 1 tablet (1,000 mcg total) by mouth daily.    FreeStyle Libre 3 Reader [place] 1 each by Does not apply route daily. Use to check glucose continuously.    FreeStyle Libre 3 Sensor Misc Place 1 sensor on the skin every 14 days. Use to check glucose continuously    hydrOXYzine  10 MG tablet Commonly known as: ATARAX  Take 1 tablet (10 mg total) by mouth 3 (three) times daily.    insulin  aspart 100 UNIT/ML injection Commonly known as: novoLOG  Inject 0-5 Units into the skin at  bedtime.    insulin  aspart 100  UNIT/ML injection Commonly known as: novoLOG  Inject 0-9 Units into the skin 3 (three) times daily with meals.    insulin  aspart 100 UNIT/ML injection Commonly known as: novoLOG  Inject 3 Units into the skin 3 (three) times daily with meals.    insulin  glargine 100 UNIT/ML injection Commonly known as: LANTUS  Inject 0.4 mLs (40 Units total) into the skin daily. Replaces: Lantus  SoloStar 100 UNIT/ML Solostar Pen    metFORMIN  1000 MG tablet Commonly known as: GLUCOPHAGE  TAKE 1 TABLET(1000 MG) BY MOUTH TWICE DAILY WITH A MEAL    mirtazapine  15 MG disintegrating tablet Commonly known as: REMERON  SOL-TAB Take 1 tablet (15 mg total) by mouth at bedtime.  Indication: Panic Disorder   ONE TOUCH ULTRA 2 w/Device Kit Dispense 1 meter to use to test blood glucose once daily. Dx code: E11.42.    OneTouch Delica Plus Lancet33G Misc USE   TO CHECK GLUCOSE UP TO THREE TIMES DAILY AS DIRECTED    OneTouch Ultra test strip Generic drug: glucose blood USE 1 STRIP TO CHECK GLUCOSE UP TO THREE TIMES DAILY AS DIRECTED.    Pen Needles 32G X 5 MM Misc 1 pen by Does not apply route daily. Use to inject insulin  daily    rosuvastatin  40 MG tablet Commonly known as: CRESTOR  TAKE 1 TABLET(40 MG) BY MOUTH DAILY    sertraline  100 MG tablet Commonly known as: ZOLOFT  Take 1 tablet (100 mg total) by mouth daily. What changed: how much to take  Indication: Generalized Anxiety Disorder        Follow-up Information     [facility] Follow up.   Why: Appointment is scheduled 05/17/2024 at 8:30 via the phone and will receive a call at [phone]. Contact information: [address]  [address] [place] [address] [phone]                 Follow-up recommendations:  Activity:  As tolerated    Signed: Christyanna Mckeon, MD 05/10/2024, 10:51 PM

## 2024-05-10 NOTE — Care Management Important Message (Signed)
 Important Message  Patient Details  Name: Tammy Acosta MRN: [id number] Date of Birth: 11/05/1955   Important Message Given:  Yes - Medicare IM     Sherryle JINNY Margo, LCSW 05/10/2024, 10:07 AM

## 2024-05-10 NOTE — Group Note (Signed)
 Date:  05/10/2024 Time:  11:02 AM  Group Topic/Focus:  Movement therapy    Participation Level:  Did Not Attend    Tammy Acosta 05/10/2024, 11:02 AM

## 2024-05-10 NOTE — BHH Counselor (Signed)
 CSW spoke with  [name] Burke, daughter, [phone].  Daughter reports that the patient has been struggling tremendously with being alone and her car not working.  She reports that the patient also can't deal with the aftermath of when she has paid her bills and doesn't have money left for cigarettes.   She expresses concern on pt being able to manage her insulin .  She reports that the patient becomes disorganized when she does not manager her insulin . CSW reviewed chart and notes and did not see where the patient was evaluated for PT/OT/home health.  CSW advised that pt follow up with her PCP on her diabetes and insulin .    CSW informed that patient was waiting for her appointment.   Sherryle Margo, MSW, LCSW 05/10/2024 10:20 AM

## 2024-05-11 ENCOUNTER — Encounter

## 2024-05-11 ENCOUNTER — Telehealth: Payer: Self-pay

## 2024-05-11 DIAGNOSIS — M792 Neuralgia and neuritis, unspecified: Secondary | ICD-10-CM

## 2024-05-11 DIAGNOSIS — B0222 Postherpetic trigeminal neuralgia: Secondary | ICD-10-CM

## 2024-05-11 DIAGNOSIS — E1142 Type 2 diabetes mellitus with diabetic polyneuropathy: Secondary | ICD-10-CM

## 2024-05-11 DIAGNOSIS — R600 Localized edema: Secondary | ICD-10-CM

## 2024-05-11 DIAGNOSIS — E785 Hyperlipidemia, unspecified: Secondary | ICD-10-CM

## 2024-05-11 DIAGNOSIS — D72829 Elevated white blood cell count, unspecified: Secondary | ICD-10-CM

## 2024-05-11 DIAGNOSIS — F32A Depression, unspecified: Secondary | ICD-10-CM

## 2024-05-11 NOTE — Telephone Encounter (Signed)
 Per Dr Abbey asked me to reach to patient's daughter in law to follow up in regards to patient's scheduled appointment with our office today. Dr Abbey wanted to follow up to see if patient was still in the hospital. Left message to give our office a call back.   OK for E2C2 to give note if pt's family calls back. If relayed, please notify the office.

## 2024-05-11 NOTE — Transitions of Care (Post Inpatient/ED Visit) (Signed)
   05/11/2024  Name: Tammy Acosta MRN: [id number] DOB: 11/11/1955  Today's TOC FU Call Status: Today's TOC FU Call Status:: Unsuccessful Call (1st Attempt) Unsuccessful Call (1st Attempt) Date: 05/11/24  Attempted to reach the patient regarding the most recent Inpatient/ED visit.  Follow Up Plan: Additional outreach attempts will be made to reach the patient to complete the Transitions of Care (Post Inpatient/ED visit) call.   Arvin Seip RN, BSN, CCM [employer], Population Health Case Manager Phone: [phone]

## 2024-05-11 NOTE — Telephone Encounter (Signed)
 Pt daughter in law called back stating she has no clue about the pt being in the hospital. She also stated that the pt is no longer staying with her and her daughter is the person that is associated with the pt . I called the daughter Tammy Acosta) and she stated the pt was discharge on 05/10/2024.

## 2024-05-12 ENCOUNTER — Telehealth: Payer: Self-pay

## 2024-05-12 NOTE — Transitions of Care (Post Inpatient/ED Visit) (Signed)
   05/12/2024  Name: Tammy Acosta MRN: [id number] DOB: 03/07/1956  Today's TOC FU Call Status: Today's TOC FU Call Status:: Unsuccessful Call (2nd Attempt) Unsuccessful Call (2nd Attempt) Date: 05/12/24  Attempted to reach the patient regarding the most recent Inpatient/ED visit.  Follow Up Plan: Additional outreach attempts will be made to reach the patient to complete the Transitions of Care (Post Inpatient/ED visit) call.   Cristobal Advani J. Monesha Monreal RN, MSN [facility], [facility] RN Care Manager Direct Dial: [phone]  Fax: [phone] Website: [email or web address]

## 2024-05-15 ENCOUNTER — Telehealth: Payer: Self-pay

## 2024-05-15 NOTE — Transitions of Care (Post Inpatient/ED Visit) (Signed)
   05/15/2024  Name: Tammy Acosta MRN: [id number] DOB: 1956/05/10  Today's TOC FU Call Status: Today's TOC FU Call Status:: Unsuccessful Call (3rd Attempt) Unsuccessful Call (3rd Attempt) Date: 05/15/24  Attempted to reach the patient regarding the most recent Inpatient/ED visit.  Follow Up Plan: No further outreach attempts will be made at this time. We have been unable to contact the patient.  [name] J. Ona Rathert RN, MSN [facility], [facility] Health RN Care Manager Direct Dial: [phone]  Fax: [phone] Website: [email or web address]

## 2024-05-24 ENCOUNTER — Encounter: Admitting: Nurse Practitioner

## 2024-05-24 ENCOUNTER — Ambulatory Visit: Payer: Self-pay

## 2024-05-24 ENCOUNTER — Other Ambulatory Visit: Payer: Self-pay

## 2024-05-24 NOTE — Telephone Encounter (Signed)
 Copied from CRM #[id number]. Topic: Clinical - Medication Refill >> May 24, 2024 12:50 PM Leah C wrote: Medication: Blood Glucose Monitoring Suppl (ONE TOUCH ULTRA 2) w/Device KIT  Has the patient contacted their pharmacy? Patient is in need of a new glucose monitor kit.  (Agent: If no, request that the patient contact the pharmacy for the refill. If patient does not wish to contact the pharmacy document the reason why and proceed with request.) (Agent: If yes, when and what did the pharmacy advise?)  This is the patient's preferred pharmacy:   [id number] [place], [place] - [address] AT [facility] [address] [place] [address] Phone: [phone] Fax: [phone]  Is this the correct pharmacy for this prescription? Yes If no, delete pharmacy and type the correct one.   Has the prescription been filled recently? No  Is the patient out of the medication? Yes  Has the patient been seen for an appointment in the last year OR does the patient have an upcoming appointment? Yes  Can we respond through MyChart? Yes  Agent: Please be advised that Rx refills may take up to 3 business days. We ask that you follow-up with your pharmacy.

## 2024-05-24 NOTE — Telephone Encounter (Signed)
 FYI Only or Action Required?: Action required by provider: request for appointment and clinical question for provider.  Patient was last seen in primary care on 09/08/2023 by Maribeth Camellia MATSU, MD.  Called Nurse Triage reporting Anxiety.  Symptoms began several days ago.  Interventions attempted: Nothing.  Symptoms are: unchanged.  Triage Disposition: See PCP When Office is Open (Within 3 Days)  Patient/caregiver understands and will follow disposition?: Yes  Copied from CRM #[id number]. Topic: Clinical - Red Word Triage >> May 24, 2024 12:52 PM Rea C wrote: Red Word that prompted transfer to Nurse Triage: Dealing with anxiety Reason for Disposition  MODERATE anxiety (e.g., persistent or frequent anxiety symptoms; interferes with sleep, school, or work)  Answer Assessment - Initial Assessment Questions No available appts today. Scheduled appt 05/25/24. Provided patient with resources: [facility] and [facility].  Patient reports unsure if can attend Friday appt, due to transportation and will call back to reschedule, if needed.  Patient reports anxiety and depression. 1. CONCERN: Did anything happen that prompted you to call today?      no 2. ANXIETY SYMPTOMS: Can you describe how you (your loved one; patient) have been feeling? (e.g., tense, restless, panicky, anxious, keyed up, overwhelmed, sense of impending doom).      Hearbeats fast, don't feel like doing anything, it's hard to describe Reports feeling depressed; no medications 3. ONSET: How long have you been feeling this way? (e.g., hours, days, weeks)     Month ago 4. SEVERITY: How would you rate the level of anxiety? (e.g., 0 - 10; or mild, moderate, severe).   Anxiety-  Moderate Depression-moderate; sertraline  100mg  2 tabs daily  5. FUNCTIONAL IMPAIRMENT: How have these feelings affected your ability to do daily activities? Have you had more difficulty than usual doing your normal daily activities? (e.g., getting  better, same, worse; self-care, school, work, interactions)     Can do daily acitivies 6. HISTORY: Have you felt this way before? Have you ever been diagnosed with an anxiety problem in the past? (e.g., generalized anxiety disorder, panic attacks, PTSD). If Yes, ask: How was this problem treated? (e.g., medicines, counseling, etc.)     depression 7. RISK OF HARM - SUICIDAL IDEATION: Do you ever have thoughts of hurting or killing yourself? If Yes, ask:  Do you have these feelings now? Do you have a plan on how you would do this?     no 8. TREATMENT:  What has been done so far to treat this anxiety? (e.g., medicines, relaxation strategies). What has helped?     setraline 9. THERAPIST: Do you have a counselor or therapist? If Yes, ask: What is their name?     no 10. POTENTIAL TRIGGERS: Do you drink caffeinated beverages (e.g., coffee, colas, teas), and how much daily? Do you drink alcohol  or use any drugs? Have you started any new medicines recently?       Just coffee 11. PATIENT SUPPORT: Who is with you now? Who do you live with? Do you have family or friends who you can talk to?        My daughter, granddaughter 25. OTHER SYMPTOMS: Do you have any other symptoms? (e.g., feeling depressed, trouble concentrating, trouble sleeping, trouble breathing, palpitations or fast heartbeat, chest pain, sweating, nausea, or diarrhea)       Fast heartbeat, ust feels anxious don't know why. Recently moved to new apartment, but like it. denies trouble breathing, chest pain, sweating Problems going to sleep and staying asleep  Protocols used:  Anxiety and Panic Attack-A-AH

## 2024-05-25 MED ORDER — ONETOUCH ULTRA 2 W/DEVICE KIT: PACK | 0 refills | Status: AC

## 2024-05-26 ENCOUNTER — Ambulatory Visit: Admitting: Nurse Practitioner

## 2024-08-07 ENCOUNTER — Other Ambulatory Visit: Payer: Self-pay

## 2024-08-07 MED ORDER — SERTRALINE HCL 100 MG PO TABS: 100.0000 mg | ORAL_TABLET | Freq: Every day | ORAL | 0 refills | Status: DC

## 2024-09-13 ENCOUNTER — Other Ambulatory Visit: Payer: Self-pay

## 2024-09-13 ENCOUNTER — Other Ambulatory Visit: Payer: Self-pay | Admitting: Nurse Practitioner

## 2024-09-13 DIAGNOSIS — E1142 Type 2 diabetes mellitus with diabetic polyneuropathy: Secondary | ICD-10-CM

## 2024-09-13 MED ORDER — METFORMIN HCL 1000 MG PO TABS: ORAL_TABLET | ORAL | 0 refills | Status: AC

## 2024-09-19 ENCOUNTER — Other Ambulatory Visit: Payer: Self-pay | Admitting: Nurse Practitioner

## 2024-09-19 ENCOUNTER — Ambulatory Visit: Payer: Self-pay

## 2024-09-19 NOTE — Telephone Encounter (Signed)
 FYI Only or Action Required?: FYI only for provider: appointment scheduled on 09/21/2024.  Patient was last seen in primary care on 09/08/2023 by Maribeth Camellia MATSU, MD.  Called Nurse Triage reporting Dental Pain.Facial pain  Symptoms began about a month ago.  Interventions attempted: Nothing.  Symptoms are: unchanged.  Triage Disposition: See Physician Within 24 Hours  Patient/caregiver understands and will follow disposition?: Yes  Message from Lonell PEDLAR sent at 09/19/2024  1:29 PM EST  Reason for Triage: diabetic trouble with face an mouth pain unable to eat, therefore cannot take insulin . Patient can barely speak, daughter, [name] on the line   Reason for Disposition  [1] MODERATE pain (e.g., interferes with normal activities) AND [2] constant AND [3] present > 24 hours  Answer Assessment - Initial Assessment Questions 1. ONSET: When did the pain start? (e.g., minutes, hours, days)     About one month 2. ONSET: Does the pain come and go, or has it been constant since it started? (e.g., constant, intermittent, fleeting)     constant 3. SEVERITY: How bad is the pain? (Scale 1-10; mild, moderate or severe)     severe 4. LOCATION: Where does it hurt?      Mouth/jay 5. RASH: Is there any redness, rash, or swelling of your face?     denies 6. FEVER: Do you have a fever? If Yes, ask: What is it, how was it measured, and when did it start?      denies 7. OTHER SYMPTOMS: Do you have any other symptoms? (e.g., fever, toothache, nasal discharge, nasal congestion, clicking sensation in jaw joint)     Difficulty eating due to pain of opening her mouth. Patient is diabetic.  Reportedly only eating meal replacement shakes and not taking insulin   Protocols used: Face Pain-A-AH

## 2024-09-21 ENCOUNTER — Ambulatory Visit: Admitting: Nurse Practitioner

## 2024-10-13 ENCOUNTER — Encounter: Admitting: Nurse Practitioner

## 2024-12-21 ENCOUNTER — Encounter: Admitting: Nurse Practitioner

## 2025-03-19 ENCOUNTER — Ambulatory Visit
